# Patient Record
Sex: Male | Born: 1955 | Race: White | Hispanic: No | Smoking: Current every day smoker
Health system: Southern US, Community
[De-identification: ages and names within clinical notes are randomized; demographics above are authoritative.]

## PROBLEM LIST (undated history)

## (undated) DIAGNOSIS — M4800 Spinal stenosis, site unspecified: Secondary | ICD-10-CM

## (undated) DIAGNOSIS — R0600 Dyspnea, unspecified: Secondary | ICD-10-CM

## (undated) DIAGNOSIS — J432 Centrilobular emphysema: Secondary | ICD-10-CM

## (undated) DIAGNOSIS — M1990 Unspecified osteoarthritis, unspecified site: Secondary | ICD-10-CM

## (undated) DIAGNOSIS — F1010 Alcohol abuse, uncomplicated: Secondary | ICD-10-CM

## (undated) DIAGNOSIS — I2510 Atherosclerotic heart disease of native coronary artery without angina pectoris: Secondary | ICD-10-CM

## (undated) DIAGNOSIS — D692 Other nonthrombocytopenic purpura: Secondary | ICD-10-CM

## (undated) DIAGNOSIS — F1210 Cannabis abuse, uncomplicated: Secondary | ICD-10-CM

## (undated) DIAGNOSIS — Z9289 Personal history of other medical treatment: Secondary | ICD-10-CM

## (undated) DIAGNOSIS — N529 Male erectile dysfunction, unspecified: Secondary | ICD-10-CM

## (undated) DIAGNOSIS — Z720 Tobacco use: Secondary | ICD-10-CM

## (undated) DIAGNOSIS — E785 Hyperlipidemia, unspecified: Secondary | ICD-10-CM

## (undated) DIAGNOSIS — H9190 Unspecified hearing loss, unspecified ear: Secondary | ICD-10-CM

## (undated) DIAGNOSIS — K219 Gastro-esophageal reflux disease without esophagitis: Secondary | ICD-10-CM

## (undated) DIAGNOSIS — I214 Non-ST elevation (NSTEMI) myocardial infarction: Secondary | ICD-10-CM

## (undated) DIAGNOSIS — I10 Essential (primary) hypertension: Secondary | ICD-10-CM

## (undated) DIAGNOSIS — I219 Acute myocardial infarction, unspecified: Secondary | ICD-10-CM

## (undated) HISTORY — PX: OTHER SURGICAL HISTORY: SHX169

## (undated) HISTORY — DX: Unspecified hearing loss, unspecified ear: H91.90

## (undated) HISTORY — DX: Atherosclerotic heart disease of native coronary artery without angina pectoris: I25.10

## (undated) HISTORY — PX: COLONOSCOPY: SHX174

## (undated) HISTORY — PX: KNEE SURGERY: SHX244

## (undated) HISTORY — DX: Acute myocardial infarction, unspecified: I21.9

## (undated) HISTORY — PX: APPENDECTOMY: SHX54

## (undated) HISTORY — DX: Personal history of other medical treatment: Z92.89

---

## 2003-09-22 ENCOUNTER — Emergency Department (HOSPITAL_COMMUNITY): Admission: EM | Admit: 2003-09-22 | Discharge: 2003-09-22 | Payer: Self-pay | Admitting: Emergency Medicine

## 2005-09-10 ENCOUNTER — Emergency Department: Payer: Self-pay | Admitting: Emergency Medicine

## 2014-06-22 ENCOUNTER — Ambulatory Visit: Payer: Self-pay

## 2016-11-20 ENCOUNTER — Encounter: Payer: Self-pay | Admitting: *Deleted

## 2016-11-20 ENCOUNTER — Ambulatory Visit
Admission: EM | Admit: 2016-11-20 | Discharge: 2016-11-20 | Disposition: A | Discharge status: Home / Self Care | Payer: Medicaid Other | Attending: Family Medicine | Admitting: Family Medicine

## 2016-11-20 ENCOUNTER — Ambulatory Visit: Payer: Medicaid Other

## 2016-11-20 DIAGNOSIS — R030 Elevated blood-pressure reading, without diagnosis of hypertension: Secondary | ICD-10-CM

## 2016-11-20 DIAGNOSIS — J209 Acute bronchitis, unspecified: Secondary | ICD-10-CM | POA: Diagnosis not present

## 2016-11-20 DIAGNOSIS — R0789 Other chest pain: Secondary | ICD-10-CM | POA: Insufficient documentation

## 2016-11-20 DIAGNOSIS — R05 Cough: Secondary | ICD-10-CM

## 2016-11-20 DIAGNOSIS — F17200 Nicotine dependence, unspecified, uncomplicated: Secondary | ICD-10-CM | POA: Insufficient documentation

## 2016-11-20 DIAGNOSIS — R42 Dizziness and giddiness: Secondary | ICD-10-CM | POA: Diagnosis present

## 2016-11-20 DIAGNOSIS — R509 Fever, unspecified: Secondary | ICD-10-CM | POA: Insufficient documentation

## 2016-11-20 DIAGNOSIS — J9801 Acute bronchospasm: Secondary | ICD-10-CM

## 2016-11-20 DIAGNOSIS — J069 Acute upper respiratory infection, unspecified: Secondary | ICD-10-CM | POA: Insufficient documentation

## 2016-11-20 MED ORDER — ALBUTEROL SULFATE HFA 108 (90 BASE) MCG/ACT IN AERS: 2.0000 | INHALATION_SPRAY | RESPIRATORY_TRACT | 0 refills | Status: DC | PRN

## 2016-11-20 MED ORDER — HYDROCOD POLST-CPM POLST ER 10-8 MG/5ML PO SUER
5.0000 mL | Freq: Two times a day (BID) | ORAL | 0 refills | Status: DC | PRN
Start: 2016-11-20 — End: 2017-01-03

## 2016-11-20 MED ORDER — AZITHROMYCIN 500 MG PO TABS: ORAL_TABLET | ORAL | 0 refills | Status: DC

## 2016-11-20 MED ORDER — PREDNISONE 10 MG (21) PO TBPK: ORAL_TABLET | ORAL | 0 refills | Status: DC

## 2016-11-20 MED ORDER — CLONIDINE HCL 0.2 MG PO TABS
0.2000 mg | ORAL_TABLET | Freq: Once | ORAL | Status: AC
  Administered 2016-11-20: 0.2 mg via ORAL

## 2016-11-20 MED ORDER — IPRATROPIUM-ALBUTEROL 0.5-2.5 (3) MG/3ML IN SOLN
3.0000 mL | Freq: Once | RESPIRATORY_TRACT | Status: AC
  Administered 2016-11-20: 3 mL via RESPIRATORY_TRACT

## 2016-11-20 NOTE — ED Provider Notes (Signed)
[facility]    CSN: [id number] Arrival date & time: 11/20/16  1027     History   Chief Complaint Chief Complaint  Patient presents with  . Cough  . Dizziness  . Fever    HPI Bryan Ibarra. is a 61 y.o. male.   Patient is a 61 year old white male with history of coughing and congestion for the last 10 days. Started last week Saturday for cough and congestion. It has progressively gotten worse. He reports having bronchospasm shortness of breath and chest tightness. He also has had chills and fever with this as well. He is also set had bilateral back pain from this. States that is progressively getting worse for wheezing is getting worse so he finally came in to be seen and evaluated. His SO's also been sick with upper respiratory tract infection and Friday she has gets antibiotics with her doctor. He has not checked his temperature see how high his temperature then. Previous surgery he's had an appendectomy when he is not 17 no chronic medications of chronic medical problems. No known history hypertension with him. He states Dr. Maryellen Pile has been his regular doctor. Unfortunately but has not smoked lately since this illness. He does report having bronchospasms as well. No pertinent family medical history relevant to today's visit and he has no known drug allergies. He is also had dizziness as well. No history hypertension for the past.   The history is provided by the patient. No language interpreter was used.  Cough  Cough characteristics:  Productive Sputum characteristics:  Yellow and green Severity:  Moderate Onset quality:  Sudden Timing:  Constant Chronicity:  New Smoker: yes   Context: exposure to allergens, upper respiratory infection and with activity   Relieved by:  Nothing Worsened by:  Deep breathing and activity Associated symptoms: fever   Risk factors: recent infection   Risk factors: no recent travel   Dizziness  Fever  Associated symptoms: cough       History reviewed. No pertinent past medical history.  There are no active problems to display for this patient.   History reviewed. No pertinent surgical history.     Home Medications    Prior to Admission medications   Medication Sig Start Date End Date Taking? Authorizing Provider  albuterol (PROVENTIL HFA;VENTOLIN HFA) 108 (90 Base) MCG/ACT inhaler Inhale 2 puffs into the lungs every 4 (four) hours as needed for wheezing or shortness of breath. 11/20/16   Hassan Rowan, MD  azithromycin (ZITHROMAX) 500 MG tablet 1 tablet day for 5 days 11/20/16   Hassan Rowan, MD  chlorpheniramine-HYDROcodone [facility] ER) 10-8 MG/5ML SUER Take 5 mLs by mouth every 12 (twelve) hours as needed. 11/20/16   Hassan Rowan, MD  predniSONE (STERAPRED UNI-PAK 21 TAB) 10 MG (21) TBPK tablet Sig 6 tablet day 1, 5 tablets day 2, 4 tablets day 3,,3tablets day 4, 2 tablets day 5, 1 tablet day 6 take all tablets orally 11/20/16   Hassan Rowan, MD    Family History History reviewed. No pertinent family history.  Social History Social History  Substance Use Topics  . Smoking status: Current Every Day Smoker  . Smokeless tobacco: Never Used  . Alcohol use Yes     Allergies   Patient has no known allergies.   Review of Systems Review of Systems  Constitutional: Positive for fever.  Respiratory: Positive for cough.   Neurological: Positive for dizziness.     Physical Exam Triage Vital Signs ED Triage  Vitals  Enc Vitals Group     BP 11/20/16 1107 (!) 180/109     Pulse Rate 11/20/16 1107 76     Resp 11/20/16 1107 16     Temp 11/20/16 1107 99.9 F (37.7 C)     Temp Source 11/20/16 1107 Oral     SpO2 11/20/16 1107 98 %     Weight 11/20/16 1110 230 lb (104.3 kg)     Height 11/20/16 1110  (1.88 m)     Head Circumference --      Peak Flow --      Pain Score --      Pain Loc --      Pain Edu? --      Excl. in GC? --    No data found.   Updated Vital Signs BP (!) 180/109 (BP  Location: Left Arm)   Pulse 76   Temp 99.9 F (37.7 C) (Oral)   Resp 16   Ht  (1.88 m)   Wt 230 lb (104.3 kg)   SpO2 98%   BMI 29.53 kg/m   Visual Acuity Right Eye Distance:   Left Eye Distance:   Bilateral Distance:    Right Eye Near:   Left Eye Near:    Bilateral Near:     Physical Exam  Constitutional: He is oriented to person, place, and time. He appears well-developed and well-nourished. No distress.  HENT:  Head: Normocephalic and atraumatic.  Right Ear: External ear normal.  Left Ear: External ear normal.  Mouth/Throat: Oropharynx is clear and moist.  Eyes: EOM are normal. Pupils are equal, round, and reactive to light.  Neck: Normal range of motion. Neck supple.  Cardiovascular: Normal rate, regular rhythm and normal heart sounds.   Pulmonary/Chest: Effort normal. He has wheezes.  Patient actively coughing  Musculoskeletal: Normal range of motion.  Neurological: He is alert and oriented to person, place, and time.  Skin: Skin is warm.  Vitals reviewed.    UC Treatments / Results  Labs (all labs ordered are listed, but only abnormal results are displayed) Labs Reviewed - No data to display  EKG  EKG Interpretation None       Radiology Dg Chest 2 View  Result Date: 11/20/2016 CLINICAL DATA:  61 year old male with upper chest tightness, shortness of breath, productive cough and fever EXAM: CHEST  2 VIEW COMPARISON:  None. FINDINGS: The lungs are clear and negative for focal airspace consolidation, pulmonary edema or suspicious pulmonary nodule. No pleural effusion or pneumothorax. Cardiac and mediastinal contours are within normal limits. Aortic atherosclerosis in the transverse aorta. The descending aorta is tortuous. No acute fracture or lytic or blastic osseous lesions. The visualized upper abdominal bowel gas pattern is unremarkable. IMPRESSION: No active cardiopulmonary disease. Aortic Atherosclerosis (ICD10-170.0) Electronically Signed   By: Malachy Moan M.D.   On: 11/20/2016 12:29    Procedures Procedures (including critical care time)  Medications Ordered in UC Medications  cloNIDine (CATAPRES) tablet 0.2 mg (0.2 mg Oral Given 11/20/16 1217)  ipratropium-albuterol (DUONEB) 0.5-2.5 (3) MG/3ML nebulizer solution 3 mL (3 mLs Nebulization Given 11/20/16 1228)     Initial Impression / Assessment and Plan / UC Course  I have reviewed the triage vital signs and the nursing notes.  Pertinent labs & imaging results that were available during my care of the patient were reviewed by me and considered in my medical decision making (see chart for details).   patient is going to need follow-up for his  blood pressure with his PCP clonidine 0.2 mg was given by mouth we'll place him on Tussionex 1 teaspoon twice a day albuterol inhaler prednisone for 6 days ago placed on full dose of Zithromax 500 mg 1 tablet daily for 5 days. Note given to work may return back to work on Wednesday.  Chest x-ray was negative for pneumonia we'll place on Zithromax 500 mg 1 tablet daily for 5 days follow-up with PCP as  Final Clinical Impressions(s) / UC Diagnoses   Final diagnoses:  Upper respiratory tract infection, unspecified type  Acute bronchitis with bronchospasm  Blood pressure elevated without history of HTN    New Prescriptions New Prescriptions   ALBUTEROL (PROVENTIL HFA;VENTOLIN HFA) 108 (90 BASE) MCG/ACT INHALER    Inhale 2 puffs into the lungs every 4 (four) hours as needed for wheezing or shortness of breath.   AZITHROMYCIN (ZITHROMAX) 500 MG TABLET    1 tablet day for 5 days   CHLORPHENIRAMINE-HYDROCODONE (TUSSIONEX PENNKINETIC ER) 10-8 MG/5ML SUER    Take 5 mLs by mouth every 12 (twelve) hours as needed.   PREDNISONE (STERAPRED UNI-PAK 21 TAB) 10 MG (21) TBPK TABLET    Sig 6 tablet day 1, 5 tablets day 2, 4 tablets day 3,,3tablets day 4, 2 tablets day 5, 1 tablet day 6 take all tablets orally     Note: This dictation was prepared with  Dragon dictation along with smaller phrase technology. Any transcriptional errors that result from this process are unintentional.   Hassan Rowan, MD 11/20/16 1248

## 2016-11-20 NOTE — ED Triage Notes (Signed)
Productive cough- green, fever, dizziness, and chest soreness with coughing x1-2 weeks. OTC meds no longer helping.

## 2017-01-01 ENCOUNTER — Emergency Department: Payer: Medicaid Other

## 2017-01-01 ENCOUNTER — Encounter: Payer: Self-pay | Admitting: Emergency Medicine

## 2017-01-01 ENCOUNTER — Inpatient Hospital Stay: Admit: 2017-01-01 | Payer: Medicaid Other

## 2017-01-01 ENCOUNTER — Encounter
Admission: EM | Disposition: A | Discharge status: Home / Self Care | Payer: Self-pay | Source: Home / Self Care | Attending: Internal Medicine

## 2017-01-01 ENCOUNTER — Ambulatory Visit (INDEPENDENT_AMBULATORY_CARE_PROVIDER_SITE_OTHER)
Admission: EM | Admit: 2017-01-01 | Discharge: 2017-01-01 | Disposition: A | Discharge status: Other Acute Inpatient Hospital | Payer: Medicaid Other | Source: Home / Self Care | Attending: Family Medicine | Admitting: Family Medicine

## 2017-01-01 ENCOUNTER — Inpatient Hospital Stay
Admission: EM | Admit: 2017-01-01 | Discharge: 2017-01-03 | DRG: 247 | Disposition: A | Discharge status: Home / Self Care | Payer: Medicaid Other | Attending: Internal Medicine | Admitting: Internal Medicine

## 2017-01-01 DIAGNOSIS — I10 Essential (primary) hypertension: Secondary | ICD-10-CM | POA: Diagnosis present

## 2017-01-01 DIAGNOSIS — R079 Chest pain, unspecified: Secondary | ICD-10-CM

## 2017-01-01 DIAGNOSIS — I472 Ventricular tachycardia: Secondary | ICD-10-CM | POA: Diagnosis present

## 2017-01-01 DIAGNOSIS — E785 Hyperlipidemia, unspecified: Secondary | ICD-10-CM | POA: Diagnosis present

## 2017-01-01 DIAGNOSIS — I214 Non-ST elevation (NSTEMI) myocardial infarction: Principal | ICD-10-CM | POA: Diagnosis present

## 2017-01-01 DIAGNOSIS — Z7952 Long term (current) use of systemic steroids: Secondary | ICD-10-CM | POA: Diagnosis not present

## 2017-01-01 DIAGNOSIS — I252 Old myocardial infarction: Secondary | ICD-10-CM

## 2017-01-01 DIAGNOSIS — F1210 Cannabis abuse, uncomplicated: Secondary | ICD-10-CM | POA: Diagnosis present

## 2017-01-01 DIAGNOSIS — F17210 Nicotine dependence, cigarettes, uncomplicated: Secondary | ICD-10-CM | POA: Diagnosis present

## 2017-01-01 DIAGNOSIS — I2510 Atherosclerotic heart disease of native coronary artery without angina pectoris: Secondary | ICD-10-CM | POA: Diagnosis present

## 2017-01-01 DIAGNOSIS — F1010 Alcohol abuse, uncomplicated: Secondary | ICD-10-CM | POA: Diagnosis present

## 2017-01-01 DIAGNOSIS — Z79899 Other long term (current) drug therapy: Secondary | ICD-10-CM | POA: Diagnosis not present

## 2017-01-01 DIAGNOSIS — I25118 Atherosclerotic heart disease of native coronary artery with other forms of angina pectoris: Secondary | ICD-10-CM | POA: Diagnosis not present

## 2017-01-01 DIAGNOSIS — F1410 Cocaine abuse, uncomplicated: Secondary | ICD-10-CM | POA: Diagnosis present

## 2017-01-01 DIAGNOSIS — Z8249 Family history of ischemic heart disease and other diseases of the circulatory system: Secondary | ICD-10-CM | POA: Diagnosis not present

## 2017-01-01 HISTORY — DX: Cannabis abuse, uncomplicated: F12.10

## 2017-01-01 HISTORY — DX: Unspecified osteoarthritis, unspecified site: M19.90

## 2017-01-01 HISTORY — PX: LEFT HEART CATH AND CORONARY ANGIOGRAPHY: CATH118249

## 2017-01-01 HISTORY — DX: Alcohol abuse, uncomplicated: F10.10

## 2017-01-01 HISTORY — PX: CORONARY STENT INTERVENTION: CATH118234

## 2017-01-01 HISTORY — DX: Tobacco use: Z72.0

## 2017-01-01 HISTORY — DX: Essential (primary) hypertension: I10

## 2017-01-01 LAB — LIPASE, BLOOD: Lipase: 33 U/L (ref 11–51)

## 2017-01-01 LAB — BASIC METABOLIC PANEL
ANION GAP: 8 (ref 5–15)
BUN: 18 mg/dL (ref 6–20)
CALCIUM: 8.7 mg/dL — AB (ref 8.9–10.3)
CHLORIDE: 104 mmol/L (ref 101–111)
CO2: 25 mmol/L (ref 22–32)
Creatinine, Ser: 0.97 mg/dL (ref 0.61–1.24)
GFR calc non Af Amer: >60 mL/min (ref >60)
Glucose, Bld: 98 mg/dL (ref 65–99)
Potassium: 4.2 mmol/L (ref 3.5–5.1)
SODIUM: 137 mmol/L (ref 135–145)

## 2017-01-01 LAB — COMPREHENSIVE METABOLIC PANEL
ALK PHOS: 61 U/L (ref 38–126)
ALT: 24 U/L (ref 17–63)
AST: 23 U/L (ref 15–41)
Albumin: 4.2 g/dL (ref 3.5–5.0)
Anion gap: 8 (ref 5–15)
BILIRUBIN TOTAL: 1 mg/dL (ref 0.3–1.2)
BUN: 23 mg/dL — AB (ref 6–20)
CALCIUM: 9 mg/dL (ref 8.9–10.3)
CO2: 23 mmol/L (ref 22–32)
CREATININE: 0.91 mg/dL (ref 0.61–1.24)
Chloride: 106 mmol/L (ref 101–111)
GFR calc Af Amer: >60 mL/min (ref >60)
GFR calc non Af Amer: >60 mL/min (ref >60)
Glucose, Bld: 100 mg/dL — ABNORMAL HIGH (ref 65–99)
Potassium: 4 mmol/L (ref 3.5–5.1)
Sodium: 137 mmol/L (ref 135–145)
TOTAL PROTEIN: 7.1 g/dL (ref 6.5–8.1)

## 2017-01-01 LAB — CBC
HEMATOCRIT: 42.2 % (ref 40.0–52.0)
Hemoglobin: 14.7 g/dL (ref 13.0–18.0)
MCH: 31.9 pg (ref 26.0–34.0)
MCHC: 35 g/dL (ref 32.0–36.0)
MCV: 91.3 fL (ref 80.0–100.0)
Platelets: 200 10*3/uL (ref 150–440)
RBC: 4.62 MIL/uL (ref 4.40–5.90)
RDW: 13.9 % (ref 11.5–14.5)
WBC: 6.9 10*3/uL (ref 3.8–10.6)

## 2017-01-01 LAB — LIPID PANEL
Cholesterol: 143 mg/dL (ref 0–200)
HDL: 64 mg/dL (ref >40)
LDL CALC: 72 mg/dL (ref 0–99)
Total CHOL/HDL Ratio: 2.2 RATIO
Triglycerides: 37 mg/dL (ref <150)
VLDL: 7 mg/dL (ref 0–40)

## 2017-01-01 LAB — PROTIME-INR
INR: 0.99
Prothrombin Time: 13.1 seconds (ref 11.4–15.2)

## 2017-01-01 LAB — MAGNESIUM
MAGNESIUM: 2.1 mg/dL (ref 1.7–2.4)
Magnesium: 2.3 mg/dL (ref 1.7–2.4)

## 2017-01-01 LAB — TROPONIN I
TROPONIN I: 0.58 ng/mL — AB (ref <0.03)
TROPONIN I: 2.26 ng/mL — AB (ref <0.03)
TROPONIN I: 5.49 ng/mL — AB (ref <0.03)
Troponin I: 0.11 ng/mL (ref <0.03)

## 2017-01-01 LAB — HEPARIN LEVEL (UNFRACTIONATED): Heparin Unfractionated: 0.4 IU/mL (ref 0.30–0.70)

## 2017-01-01 LAB — MRSA PCR SCREENING: MRSA by PCR: NEGATIVE

## 2017-01-01 LAB — POCT ACTIVATED CLOTTING TIME
ACTIVATED CLOTTING TIME: 279 s
Activated Clotting Time: 252 seconds

## 2017-01-01 LAB — GLUCOSE, CAPILLARY: Glucose-Capillary: 87 mg/dL (ref 65–99)

## 2017-01-01 LAB — APTT: aPTT: 28 seconds (ref 24–36)

## 2017-01-01 SURGERY — LEFT HEART CATH AND CORONARY ANGIOGRAPHY
Anesthesia: Moderate Sedation

## 2017-01-01 MED ORDER — IOPAMIDOL (ISOVUE-370) INJECTION 76%
100.0000 mL | Freq: Once | INTRAVENOUS | Status: AC | PRN
  Administered 2017-01-01: 100 mL via INTRAVENOUS

## 2017-01-01 MED ORDER — HEPARIN (PORCINE) IN NACL 100-0.45 UNIT/ML-% IJ SOLN
1300.0000 [IU]/h | INTRAMUSCULAR | Status: DC
  Administered 2017-01-01: 1300 [IU]/h via INTRAVENOUS
  Filled 2017-01-01: qty 250

## 2017-01-01 MED ORDER — MORPHINE SULFATE (PF) 2 MG/ML IV SOLN
2.0000 mg | INTRAVENOUS | Status: DC | PRN
  Administered 2017-01-01 – 2017-01-02 (×4): 2 mg via INTRAVENOUS
  Filled 2017-01-01 (×4): qty 1

## 2017-01-01 MED ORDER — ALBUTEROL SULFATE HFA 108 (90 BASE) MCG/ACT IN AERS: 2.0000 | INHALATION_SPRAY | RESPIRATORY_TRACT | Status: DC | PRN

## 2017-01-01 MED ORDER — SODIUM CHLORIDE 0.9% FLUSH
3.0000 mL | Freq: Two times a day (BID) | INTRAVENOUS | Status: DC
  Administered 2017-01-02 – 2017-01-03 (×3): 3 mL via INTRAVENOUS

## 2017-01-01 MED ORDER — CARVEDILOL 6.25 MG PO TABS
6.2500 mg | ORAL_TABLET | Freq: Two times a day (BID) | ORAL | Status: DC
  Administered 2017-01-01 – 2017-01-03 (×4): 6.25 mg via ORAL
  Filled 2017-01-01 (×4): qty 1

## 2017-01-01 MED ORDER — DOCUSATE SODIUM 100 MG PO CAPS: 100.0000 mg | ORAL_CAPSULE | Freq: Two times a day (BID) | ORAL | Status: DC | PRN

## 2017-01-01 MED ORDER — NITROGLYCERIN 0.4 MG SL SUBL
0.4000 mg | SUBLINGUAL_TABLET | SUBLINGUAL | Status: DC | PRN
  Administered 2017-01-01 (×3): 0.4 mg via SUBLINGUAL
  Filled 2017-01-01: qty 1

## 2017-01-01 MED ORDER — IOPAMIDOL (ISOVUE-300) INJECTION 61%
INTRAVENOUS | Status: DC | PRN
  Administered 2017-01-01: 135 mL via INTRA_ARTERIAL

## 2017-01-01 MED ORDER — ALBUTEROL SULFATE (2.5 MG/3ML) 0.083% IN NEBU: 2.5000 mg | INHALATION_SOLUTION | RESPIRATORY_TRACT | Status: DC | PRN

## 2017-01-01 MED ORDER — HEPARIN (PORCINE) IN NACL 2-0.9 UNIT/ML-% IJ SOLN
INTRAMUSCULAR | Status: AC
  Filled 2017-01-01: qty 1000

## 2017-01-01 MED ORDER — TICAGRELOR 90 MG PO TABS
ORAL_TABLET | ORAL | Status: DC | PRN
  Administered 2017-01-01: 180 mg via ORAL

## 2017-01-01 MED ORDER — ASPIRIN 81 MG PO CHEW: 81.0000 mg | CHEWABLE_TABLET | ORAL | Status: DC

## 2017-01-01 MED ORDER — ASPIRIN EC 325 MG PO TBEC
325.0000 mg | DELAYED_RELEASE_TABLET | Freq: Every day | ORAL | Status: DC
  Administered 2017-01-01: 325 mg via ORAL
  Filled 2017-01-01: qty 1

## 2017-01-01 MED ORDER — HEPARIN BOLUS VIA INFUSION
4000.0000 [IU] | Freq: Once | INTRAVENOUS | Status: AC
  Administered 2017-01-01: 4000 [IU] via INTRAVENOUS
  Filled 2017-01-01: qty 4000

## 2017-01-01 MED ORDER — MORPHINE SULFATE (PF) 4 MG/ML IV SOLN
4.0000 mg | Freq: Once | INTRAVENOUS | Status: AC
  Administered 2017-01-01: 4 mg via INTRAVENOUS
  Filled 2017-01-01: qty 1

## 2017-01-01 MED ORDER — ASPIRIN 81 MG PO CHEW
81.0000 mg | CHEWABLE_TABLET | Freq: Every day | ORAL | Status: DC
  Administered 2017-01-01 – 2017-01-03 (×3): 81 mg via ORAL
  Filled 2017-01-01 (×3): qty 1

## 2017-01-01 MED ORDER — VERAPAMIL HCL 2.5 MG/ML IV SOLN
INTRAVENOUS | Status: AC
  Filled 2017-01-01: qty 2

## 2017-01-01 MED ORDER — NITROGLYCERIN 1 MG/10 ML FOR IR/CATH LAB
INTRA_ARTERIAL | Status: DC | PRN
  Administered 2017-01-01: 200 ug via INTRACORONARY

## 2017-01-01 MED ORDER — ENOXAPARIN SODIUM 40 MG/0.4ML ~~LOC~~ SOLN
40.0000 mg | SUBCUTANEOUS | Status: DC
  Administered 2017-01-02 – 2017-01-03 (×2): 40 mg via SUBCUTANEOUS
  Filled 2017-01-01 (×2): qty 0.4

## 2017-01-01 MED ORDER — SODIUM CHLORIDE 0.9% FLUSH: 3.0000 mL | INTRAVENOUS | Status: DC | PRN

## 2017-01-01 MED ORDER — SODIUM CHLORIDE 0.9 % WEIGHT BASED INFUSION
1.0000 mL/kg/h | INTRAVENOUS | Status: DC
  Administered 2017-01-01 – 2017-01-02 (×2): 1 mL/kg/h via INTRAVENOUS

## 2017-01-01 MED ORDER — TICAGRELOR 90 MG PO TABS
90.0000 mg | ORAL_TABLET | Freq: Two times a day (BID) | ORAL | Status: DC
  Administered 2017-01-02 – 2017-01-03 (×3): 90 mg via ORAL
  Filled 2017-01-01 (×4): qty 1

## 2017-01-01 MED ORDER — NITROGLYCERIN 5 MG/ML IV SOLN
INTRAVENOUS | Status: AC
  Filled 2017-01-01: qty 10

## 2017-01-01 MED ORDER — MIDAZOLAM HCL 2 MG/2ML IJ SOLN
INTRAMUSCULAR | Status: DC | PRN
  Administered 2017-01-01: 1 mg via INTRAVENOUS

## 2017-01-01 MED ORDER — SODIUM CHLORIDE 0.9 % IV SOLN
INTRAVENOUS | Status: DC
  Administered 2017-01-01: 18:00:00 via INTRAVENOUS

## 2017-01-01 MED ORDER — HEPARIN SODIUM (PORCINE) 1000 UNIT/ML IJ SOLN
INTRAMUSCULAR | Status: AC
  Filled 2017-01-01: qty 1

## 2017-01-01 MED ORDER — ONDANSETRON HCL 4 MG/2ML IJ SOLN
4.0000 mg | Freq: Once | INTRAMUSCULAR | Status: AC
  Administered 2017-01-01: 4 mg via INTRAVENOUS
  Filled 2017-01-01: qty 2

## 2017-01-01 MED ORDER — VERAPAMIL HCL 2.5 MG/ML IV SOLN
INTRAVENOUS | Status: DC | PRN
  Administered 2017-01-01: 2.5 mg via INTRA_ARTERIAL

## 2017-01-01 MED ORDER — SODIUM CHLORIDE 0.9 % IV SOLN: 250.0000 mL | INTRAVENOUS | Status: DC | PRN

## 2017-01-01 MED ORDER — TICAGRELOR 90 MG PO TABS
ORAL_TABLET | ORAL | Status: AC
  Filled 2017-01-01: qty 2

## 2017-01-01 MED ORDER — SODIUM CHLORIDE 0.9% FLUSH: 3.0000 mL | Freq: Two times a day (BID) | INTRAVENOUS | Status: DC

## 2017-01-01 MED ORDER — HYDRALAZINE HCL 20 MG/ML IJ SOLN
10.0000 mg | INTRAMUSCULAR | Status: DC | PRN
  Administered 2017-01-02: 10 mg via INTRAVENOUS
  Filled 2017-01-01: qty 1

## 2017-01-01 MED ORDER — HEPARIN SODIUM (PORCINE) 1000 UNIT/ML IJ SOLN
INTRAMUSCULAR | Status: DC | PRN
  Administered 2017-01-01: 1800 [IU] via INTRAVENOUS
  Administered 2017-01-01: 5000 [IU] via INTRAVENOUS
  Administered 2017-01-01: 4000 [IU] via INTRAVENOUS

## 2017-01-01 MED ORDER — MIDAZOLAM HCL 2 MG/2ML IJ SOLN
INTRAMUSCULAR | Status: AC
  Filled 2017-01-01: qty 2

## 2017-01-01 MED ORDER — NITROGLYCERIN 0.4 MG SL SUBL: 0.4000 mg | SUBLINGUAL_TABLET | SUBLINGUAL | Status: DC | PRN

## 2017-01-01 MED ORDER — ATORVASTATIN CALCIUM 20 MG PO TABS
80.0000 mg | ORAL_TABLET | Freq: Every day | ORAL | Status: DC
  Administered 2017-01-02: 80 mg via ORAL
  Filled 2017-01-01: qty 4

## 2017-01-01 MED ORDER — SODIUM CHLORIDE 0.9% FLUSH
3.0000 mL | INTRAVENOUS | Status: DC | PRN
  Administered 2017-01-02: 3 mL via INTRAVENOUS
  Filled 2017-01-01: qty 3

## 2017-01-01 SURGICAL SUPPLY — 15 items
BALLN TREK RX 2.5X12 (BALLOONS) ×2
BALLN ~~LOC~~ TREK RX 3.5X12 (BALLOONS) ×2
BALLOON TREK RX 2.5X12 (BALLOONS) ×1 IMPLANT
BALLOON ~~LOC~~ TREK RX 3.5X12 (BALLOONS) ×1 IMPLANT
CATH OPTITORQUE JACKY 4.0 5F (CATHETERS) ×2 IMPLANT
CATH VISTA GUIDE 6FR XBLAD3.5 (CATHETERS) ×2 IMPLANT
DEVICE INFLAT 30 PLUS (MISCELLANEOUS) ×2 IMPLANT
DEVICE RAD COMP TR BAND LRG (VASCULAR PRODUCTS) ×2 IMPLANT
GLIDESHEATH SLEND SS 6F .021 (SHEATH) ×2 IMPLANT
KIT MANI 3VAL PERCEP (MISCELLANEOUS) ×2 IMPLANT
PACK CARDIAC CATH (CUSTOM PROCEDURE TRAY) ×2 IMPLANT
STENT RESOLUTE ONYX 3.0X18 (Permanent Stent) ×2 IMPLANT
WIRE HITORQ VERSACORE ST 145CM (WIRE) ×2 IMPLANT
WIRE ROSEN-J .035X260CM (WIRE) ×2 IMPLANT
WIRE RUNTHROUGH .014X180CM (WIRE) ×2 IMPLANT

## 2017-01-01 NOTE — Care Management (Signed)
[facility].  Admitted with nstemi.  Heparin drip.  Medicaid Access Care.  No issues accessing medical care,obtaining medications.  His disability is due to arthritis.  Cardiology consult is pending

## 2017-01-01 NOTE — Progress Notes (Signed)
Patient admitted at about 1500 for chest pain radiating down his left arm.  Since admission his troponin's have elevated.  His chest pain remains a "6" of 10, and he's having short runs of vtach, which are becoming more frequent.   Dr. Kirke CorinArida will perform a cardiac cath within the next hour.

## 2017-01-01 NOTE — ED Triage Notes (Signed)
States began mid chest pain approx 9am, states increases with inspiration. States pain 10/10 on arrival, intermittent.

## 2017-01-01 NOTE — ED Notes (Signed)
Patient transported to CT 

## 2017-01-01 NOTE — Consult Note (Signed)
Cardiology Consult    Patient ID: Bryan Ibarra. MRN: [id number], DOB/AGE: 1955-08-05   Admit date: 01/01/2017 Date of Consult: 01/01/2017  Primary Physician: Derwood Kaplan, MD Primary Cardiologist: New Requesting Provider: Ozella Rocks, MD  Patient Profile    Bryan Ibarra. is a 61 y.o. male with a history of HTN, tob abuse, and remote cocaine abuse, who is being seen today for the evaluation of chest pain and NSTEMI at the request of Dr. Elisabeth Pigeon.  Past Medical History   Past Medical History:  Diagnosis Date  . Alcohol abuse    a. up to a 12 pack of beer/day.  . Arthritis   . Hypertension   . Marijuana abuse    a. occasional.  . Tobacco abuse     Past Surgical History:  Procedure Laterality Date  . APPENDECTOMY    . KNEE SURGERY       Allergies  No Known Allergies  History of Present Illness    61 y/o ? without a prior cardiac history.  He does have a h/o HTN, tob abuse, MJ use, ETOH abuse, remote cocaine abuse, and FH of premature CAD (father).  He is on disability related to knee pain s/p bilateral surgeries.  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  He was in his usoh until this afternoon, when he was push mowing his own yard and developed severe sscp radiating down both arms, associated with nausea, dyspnea, and paresthesias of the fingers.  He continued to mow his lawn for another 20 mins and then stopped due to persistent symptoms and called out to his girlfriend.  His GF brought him to urgent care in [facility] where EMS was called and he was taken to the [facility] ED.  Here, he continued to c/o chest pain. ECG w/ borderline ST elevation and freq pvc's.  Started on heparin and pain treated w/ ntg and morphine.  He is currently resting comfortably but says he continues to have c/p.  He has requested additional morphine.  Troponins have risen to 2.26.  Inpatient Medications    . aspirin EC  325 mg Oral Daily  . carvedilol  6.25 mg  Oral BID WC    Family History    Family History  Problem Relation Age of Onset  . CAD Father        s/p CABG in his 43's or 57's.  Marland Kitchen CAD Paternal Grandfather     Social History    Social History   Social History  . Marital status: Divorced    Spouse name: N/A  . Number of children: N/A  . Years of education: N/A   Occupational History  . Not on file.   Social History Main Topics  . Smoking status: Current Every Day Smoker    Packs/day: 1.50    Types: Cigarettes  . Smokeless tobacco: Never Used  . Alcohol use Yes     Comment: 12 pack of beer most days of the week.  . Drug use: Yes    Types: Marijuana     Comment: occasional MJ.  Remote cocaine.  . Sexual activity: Yes   Other Topics Concern  . Not on file   Social History Narrative   Lives in [place].  He lives in a mobile home in the backyard of his girlfriend.  He works in Aeronautical engineer.     Review of Systems    General:  No chills, fever, night sweats or  weight changes.  Cardiovascular:  +++ chest pain, +++ dyspnea on exertion, no edema, orthopnea, palpitations, paroxysmal nocturnal dyspnea. Dermatological: No rash, lesions/masses Respiratory: No cough, dyspnea Urologic: No hematuria, dysuria Abdominal:   +++ nausea, no vomiting, diarrhea, bright red blood per rectum, melena, or hematemesis Neurologic:  No visual changes, wkns, changes in mental status. All other systems reviewed and are otherwise negative except as noted above.  Physical Exam    Blood pressure (!) 153/92, pulse 64, temperature 97.8 F (36.6 C), temperature source Oral, resp. rate 18, height 6\' 2"  (1.88 m), weight 235 lb (106.6 kg), SpO2 100 %.  General: Pleasant, NAD Psych: Normal affect. Neuro: Alert and oriented X 3. Moves all extremities spontaneously. HEENT: Normal  Neck: Supple without bruits or JVD. Lungs:  Resp regular and unlabored, diminished breath sounds bilat. Heart: RRR, distant, no s3, s4, or murmurs. Abdomen: Soft,  non-tender, non-distended, BS + x 4.  Extremities: No clubbing, cyanosis or edema. DP/PT/Radials 2+ and equal bilaterally.  Labs     Recent Labs  01/01/17 1038 01/01/17 1310 01/01/17 1546  TROPONINI 0.11* 0.58* 2.26*   Lab Results  Component Value Date   WBC 6.9 01/01/2017   HGB 14.7 01/01/2017   HCT 42.2 01/01/2017   MCV 91.3 01/01/2017   PLT 200 01/01/2017     Recent Labs Lab 01/01/17 1038  NA 137  K 4.0  CL 106  CO2 23  BUN 23*  CREATININE 0.91  CALCIUM 9.0  PROT 7.1  BILITOT 1.0  ALKPHOS 61  ALT 24  AST 23  GLUCOSE 100*   Lab Results  Component Value Date   CHOL 143 01/01/2017   HDL 64 01/01/2017   LDLCALC 72 01/01/2017   TRIG 37 01/01/2017    Radiology Studies    Dg Chest 2 View  Result Date: 01/01/2017 CLINICAL DATA:  Chest pain EXAM: CHEST  2 VIEW COMPARISON:  November 20, 2016 FINDINGS: There is no edema or consolidation. The heart size and pulmonary vascularity are normal. No adenopathy. There is aortic atherosclerosis. There is left carotid artery calcification. IMPRESSION: Aortic atherosclerosis. Left carotid artery calcification. No edema or consolidation. Electronically Signed   By: Bretta Bang III M.D.   On: 01/01/2017 11:05   Ct Angio Chest/abd/pel For Dissection W And/or Wo Contrast  Result Date: 01/01/2017 CLINICAL DATA:  Chest and abdominal pain EXAM: CT ANGIOGRAPHY CHEST, ABDOMEN AND PELVIS TECHNIQUE: Initially, axial CT images were obtained through the chest without intravenous contrast material administration. Multidetector CT imaging through the chest, abdomen and pelvis was performed using the standard protocol during bolus administration of intravenous contrast. Multiplanar reconstructed images and MIPs were obtained and reviewed to evaluate the vascular anatomy. CONTRAST:  100 mL Isovue 370 nonionic COMPARISON:  Chest radiograph January 01, 2017 FINDINGS: CTA CHEST FINDINGS Cardiovascular: There is no evident intramural hematoma on  noncontrast enhanced study. There is no appreciable thoracic aortic aneurysm or dissection. The visualized great vessels appear normal except for slight atherosclerotic calcification in the right common carotid artery. There is calcification in the aortic arch region. There are foci of coronary artery calcification. Pericardium is not appreciably thickened. No pulmonary embolus is demonstrated. Mediastinum/Nodes: Visualized thyroid appears unremarkable. There is no appreciable thoracic adenopathy. There is a small hiatal hernia. Lungs/Pleura: On axial slice 52 series 7, there is a 5 mm nodular opacity in the medial segment of the right lower lobe. There is no lung edema or consolidation. No pleural effusion or pleural thickening. No pneumothorax. Musculoskeletal: There  old healed rib fractures on the left. There are no blastic or lytic bone lesions. Review of the MIP images confirms the above findings. CTA ABDOMEN AND PELVIS FINDINGS VASCULAR Aorta: There is no abdominal aortic aneurysm or dissection. There is atherosclerotic calcification and slight peripheral thrombus in the distal abdominal aorta. Celiac: There is no celiac artery aneurysm or dissection. This vessel and its major branches are patent. SMA: There is no superior mesenteric artery aneurysm or dissection. The specimen its major branches appear patent. Renals: There is a single renal artery on each side. The right main renal artery bifurcates slightly beyond its origin. There is no aneurysm or dissection involving the renal artery and its branches. No fibromuscular dysplasia evident. Renal arteries in their peripheral branches appear patent. IMA: Inferior mesenteric artery and its major branches appear patent. Inferior mesenteric artery and its branches show no aneurysm or dissection. Inflow: There is atherosclerotic calcification at the origin of the left common iliac artery with 40-50% diameter stenosis at the origin of the left common iliac artery.  There is minimal atherosclerotic calcification in the right common iliac artery. There is mild calcification at the origin of the left internal iliac artery. Scattered foci of calcification are noted in each internal iliac artery. The other major pelvic vessels appear patent. No aneurysm or dissection evident in the major pelvic arterial vessels. Note that there is mild calcification in the periphery of the right common femoral artery with perhaps 15-20% diameter stenosis. Veins: No obvious venous abnormality within the limitations of this arterial phase study. Review of the MIP images confirms the above findings. NON-VASCULAR Hepatobiliary: No focal liver lesions are appreciable. Gallbladder wall is not appreciably thickened. There is no biliary duct dilatation. Pancreas: No pancreatic mass or inflammatory focus. Spleen: No splenic lesions are evident. Adrenals/Urinary Tract: Right adrenal appears normal. There is mild generalized left adrenal hypertrophy. Kidneys bilaterally show no evident mass or hydronephrosis on either side. There is no renal or ureteral calculus on either side. Urinary bladder is midline with wall thickness within normal limits. Stomach/Bowel: There is no bowel wall or mesenteric thickening. There are scattered sigmoid and distal descending colonic diverticulum without diverticulitis. There is no bowel obstruction. No free air or portal venous air. Lymphatic: There is no evident adenopathy in the abdomen or pelvis. Reproductive: Prostate and seminal vesicles appear normal in size and contour. No evident pelvic mass. Other: Appendix absent. No abscess or ascites evident in the abdomen or pelvis. There is a minimal ventral hernia containing only fat. Musculoskeletal: There is mild degenerative change in the lumbar spine. There is moderate spinal stenosis at L4-5 due to diffuse disc protrusion and bony hypertrophy. There are no blastic or lytic bone lesions. No intramuscular or abdominal wall  lesion. Review of the MIP images confirms the above findings. IMPRESSION: CT angiogram chest: No thoracic aortic aneurysm or dissection. No pulmonary embolus. Scattered foci of atherosclerotic calcification in the aorta as well as foci of coronary artery calcification. No lung edema or consolidation. 5 mm nodular opacity right lower lobe medial segment. No follow-up needed if patient is low-risk. Non-contrast chest CT can be considered in 12 months if patient is high-risk. This recommendation follows the consensus statement: Guidelines for Management of Incidental Pulmonary Nodules Detected on CT Images: From the Fleischner Society 2017; Radiology 2017; 284:228-243. No adenopathy. Subacute to chronic appearing rib fractures on the left. CT angiogram abdomen and CT angiogram pelvis: No aneurysm or dissection in the aorta or major mesenteric/ pelvic arterial vessels.  Scattered areas of atherosclerosis, most notably in the distal aorta and origin of the left common iliac artery. No high-grade obstruction. Spinal stenosis at L4-5, multifactorial. Left adrenal hypertrophy.  Significance of this finding uncertain. Scattered left-sided colonic diverticular without diverticulitis. No bowel obstruction. No abscess. Appendix absent. Minimal ventral hernia containing only fat. Electronically Signed   By: Bretta Bang III M.D.   On: 01/01/2017 12:12    ECG & Cardiac Imaging    RSR, 65, PVC, LAD, LAFB, ant infarct.  Assessment & Plan    1.  NSTEMI:  Pt presented 6/4 with persistent c/p after mowing his lawn.  He has never had c/p like this before.  Troponins have risen to 2.26.  He cont to report chest pain - appears comfortable.  Freq PVC's and short runs of NSVT on tele.  Will plan on diagnostic cath this evening.  The patient understands that risks include but are not limited to stroke (1 in 1000), death (1 in 1000), kidney failure [usually temporary] (1 in 500), bleeding (1 in 200), allergic reaction [possibly  serious] (1 in 200), and agrees to proceed.  Cont asa,  blocker.  Add high potency statin.  Eventual cardiac rehab.  Check echo.  2.  HTN:  Not on meds @ home.  Agree w/  blocker.  If renal fxn stable post cath, will consider ARB.  3.  Pulm Nodule:  5mm RLL nodular opacity noted on CTA  rec 12 month f/u.  4.  Tob Abuse:  Cessation advised.  Will add nicotine patch.  5.  ETOH abuse:  Up to a 12 pack of beer most days of the week.  Will likely need CIWA protocol.  6.  Lipids:  LDL 72.  High potency statin in the setting of #1.  Signed, Nicolasa Ducking, NP 01/01/2017, 5:37 PM

## 2017-01-01 NOTE — Progress Notes (Signed)
[facility] for this [facility]. [facility], patient aware.

## 2017-01-01 NOTE — ED Triage Notes (Signed)
Mid sternal chest pain while mowing the grass 45 min ago. Feels like an elephant is sitting on his chest. Arms tingling, clammy. Pain 10/10

## 2017-01-01 NOTE — H&P (Signed)
[employer] - [place] at [facility]   PATIENT NAME: Bryan Ibarra    MR#:  [id number]  DATE OF BIRTH:  23-Aug-1955  DATE OF ADMISSION:  01/01/2017  PRIMARY CARE PHYSICIAN: Derwood Kaplan, MD   REQUESTING/REFERRING PHYSICIAN: [email or web address]  CHIEF COMPLAINT:   Chief Complaint  Patient presents with  . Chest Pain    HISTORY OF PRESENT ILLNESS: Bryan Ibarra  is a 61 y.o. male with a known history of Arthritis and hypertension- does not follow with any physician as outpatient since long time. She does labor work and Musician. He has lot of physical activities every day and denies any complaints with that. Today while he was mowing the lawn he had pain in his chest and going to his left arm, he also continue to feel short of breath with that and sweating. Concerned with this he came to emergency room. He denies any cough or sputum production or palpitation. He felt slightly better after receiving nitroglycerin tablets sublingually in ER. His troponin was noted 0.11 so concerning his typical symptoms started on heparin IV drip and given to hospitalist team for further management of non-ST elevation MI.  PAST MEDICAL HISTORY:   Past Medical History:  Diagnosis Date  . Arthritis   . Hypertension     PAST SURGICAL HISTORY: Past Surgical History:  Procedure Laterality Date  . APPENDECTOMY    . KNEE SURGERY      SOCIAL HISTORY:  Social History  Substance Use Topics  . Smoking status: Current Every Day Smoker    Packs/day: 1.50    Types: Cigarettes  . Smokeless tobacco: Never Used  . Alcohol use Yes     Comment: several times weekly    FAMILY HISTORY:  Family History  Problem Relation Age of Onset  . CAD Father   . CAD Paternal Grandfather     DRUG ALLERGIES: No Known Allergies  REVIEW OF SYSTEMS:   CONSTITUTIONAL: No fever, fatigue or weakness.  EYES: No blurred or double vision.  EARS, NOSE, AND THROAT: No tinnitus or ear pain.  RESPIRATORY: No  cough, Positive for shortness of breath, wheezing or hemoptysis.  CARDIOVASCULAR: Positive for chest pain, no orthopnea, edema.  GASTROINTESTINAL: No nausea, vomiting, diarrhea or abdominal pain.  GENITOURINARY: No dysuria, hematuria.  ENDOCRINE: No polyuria, nocturia,  HEMATOLOGY: No anemia, easy bruising or bleeding SKIN: No rash or lesion. MUSCULOSKELETAL: No joint pain or arthritis.   NEUROLOGIC: No tingling, numbness, weakness.  PSYCHIATRY: No anxiety or depression.   MEDICATIONS AT HOME:  Prior to Admission medications   Medication Sig Start Date End Date Taking? Authorizing Provider  albuterol (PROVENTIL HFA;VENTOLIN HFA) 108 (90 Base) MCG/ACT inhaler Inhale 2 puffs into the lungs every 4 (four) hours as needed for wheezing or shortness of breath. 11/20/16   Hassan Rowan, MD  azithromycin (ZITHROMAX) 500 MG tablet 1 tablet day for 5 days 11/20/16   Hassan Rowan, MD  chlorpheniramine-HYDROcodone [facility] ER) 10-8 MG/5ML SUER Take 5 mLs by mouth every 12 (twelve) hours as needed. 11/20/16   Hassan Rowan, MD  predniSONE (STERAPRED UNI-PAK 21 TAB) 10 MG (21) TBPK tablet Sig 6 tablet day 1, 5 tablets day 2, 4 tablets day 3,,3tablets day 4, 2 tablets day 5, 1 tablet day 6 take all tablets orally 11/20/16   Hassan Rowan, MD      PHYSICAL EXAMINATION:   VITAL SIGNS: Blood pressure (!) 143/88, pulse 75, temperature 97.8 F (36.6 C), temperature source Oral, resp. rate 20, height  6\' 2"  (1.88 m), weight 106.6 kg (235 lb), SpO2 (!) 84 %.  GENERAL:  61 y.o.-year-old patient lying in the bed with no acute distress.  EYES: Pupils equal, round, reactive to light and accommodation. No scleral icterus. Extraocular muscles intact.  HEENT: Head atraumatic, normocephalic. Oropharynx and nasopharynx clear.  NECK:  Supple, no jugular venous distention. No thyroid enlargement, no tenderness.  LUNGS: Normal breath sounds bilaterally, no wheezing, rales,rhonchi or crepitation. No use of  accessory muscles of respiration.  CARDIOVASCULAR: S1, S2 normal. No murmurs, rubs, or gallops.  ABDOMEN: Soft, nontender, nondistended. Bowel sounds present. No organomegaly or mass.  EXTREMITIES: No pedal edema, cyanosis, or clubbing.  NEUROLOGIC: Cranial nerves II through XII are intact. Muscle strength 5/5 in all extremities. Sensation intact. Gait not checked.  PSYCHIATRIC: The patient is alert and oriented x 3.  SKIN: No obvious rash, lesion, or ulcer.   LABORATORY PANEL:   CBC  Recent Labs Lab 01/01/17 1038  WBC 6.9  HGB 14.7  HCT 42.2  PLT 200  MCV 91.3  MCH 31.9  MCHC 35.0  RDW 13.9   ------------------------------------------------------------------------------------------------------------------  Chemistries   Recent Labs Lab 01/01/17 1038  NA 137  K 4.0  CL 106  CO2 23  GLUCOSE 100*  BUN 23*  CREATININE 0.91  CALCIUM 9.0  AST 23  ALT 24  ALKPHOS 61  BILITOT 1.0   ------------------------------------------------------------------------------------------------------------------ estimated creatinine clearance is 112.3 mL/min (by C-G formula based on SCr of 0.91 mg/dL). ------------------------------------------------------------------------------------------------------------------ No results for input(s): TSH, T4TOTAL, T3FREE, THYROIDAB in the last 72 hours.  Invalid input(s): FREET3   Coagulation profile  Recent Labs Lab 01/01/17 1131  INR 0.99   ------------------------------------------------------------------------------------------------------------------- No results for input(s): DDIMER in the last 72 hours. -------------------------------------------------------------------------------------------------------------------  Cardiac Enzymes  Recent Labs Lab 01/01/17 1038  TROPONINI 0.11*   ------------------------------------------------------------------------------------------------------------------ Invalid input(s):  POCBNP  ---------------------------------------------------------------------------------------------------------------  Urinalysis No results found for: COLORURINE, APPEARANCEUR, LABSPEC, PHURINE, GLUCOSEU, HGBUR, BILIRUBINUR, KETONESUR, PROTEINUR, UROBILINOGEN, NITRITE, LEUKOCYTESUR   RADIOLOGY: Dg Chest 2 View  Result Date: 01/01/2017 CLINICAL DATA:  Chest pain EXAM: CHEST  2 VIEW COMPARISON:  November 20, 2016 FINDINGS: There is no edema or consolidation. The heart size and pulmonary vascularity are normal. No adenopathy. There is aortic atherosclerosis. There is left carotid artery calcification. IMPRESSION: Aortic atherosclerosis. Left carotid artery calcification. No edema or consolidation. Electronically Signed   By: Bretta BangWilliam  Woodruff III M.D.   On: 01/01/2017 11:05   Ct Angio Chest/abd/pel For Dissection W And/or Wo Contrast  Result Date: 01/01/2017 CLINICAL DATA:  Chest and abdominal pain EXAM: CT ANGIOGRAPHY CHEST, ABDOMEN AND PELVIS TECHNIQUE: Initially, axial CT images were obtained through the chest without intravenous contrast material administration. Multidetector CT imaging through the chest, abdomen and pelvis was performed using the standard protocol during bolus administration of intravenous contrast. Multiplanar reconstructed images and MIPs were obtained and reviewed to evaluate the vascular anatomy. CONTRAST:  100 mL Isovue 370 nonionic COMPARISON:  Chest radiograph January 01, 2017 FINDINGS: CTA CHEST FINDINGS Cardiovascular: There is no evident intramural hematoma on noncontrast enhanced study. There is no appreciable thoracic aortic aneurysm or dissection. The visualized great vessels appear normal except for slight atherosclerotic calcification in the right common carotid artery. There is calcification in the aortic arch region. There are foci of coronary artery calcification. Pericardium is not appreciably thickened. No pulmonary embolus is demonstrated. Mediastinum/Nodes:  Visualized thyroid appears unremarkable. There is no appreciable thoracic adenopathy. There is a small hiatal hernia. Lungs/Pleura: On axial slice 52  series 7, there is a 5 mm nodular opacity in the medial segment of the right lower lobe. There is no lung edema or consolidation. No pleural effusion or pleural thickening. No pneumothorax. Musculoskeletal: There old healed rib fractures on the left. There are no blastic or lytic bone lesions. Review of the MIP images confirms the above findings. CTA ABDOMEN AND PELVIS FINDINGS VASCULAR Aorta: There is no abdominal aortic aneurysm or dissection. There is atherosclerotic calcification and slight peripheral thrombus in the distal abdominal aorta. Celiac: There is no celiac artery aneurysm or dissection. This vessel and its major branches are patent. SMA: There is no superior mesenteric artery aneurysm or dissection. The specimen its major branches appear patent. Renals: There is a single renal artery on each side. The right main renal artery bifurcates slightly beyond its origin. There is no aneurysm or dissection involving the renal artery and its branches. No fibromuscular dysplasia evident. Renal arteries in their peripheral branches appear patent. IMA: Inferior mesenteric artery and its major branches appear patent. Inferior mesenteric artery and its branches show no aneurysm or dissection. Inflow: There is atherosclerotic calcification at the origin of the left common iliac artery with 40-50% diameter stenosis at the origin of the left common iliac artery. There is minimal atherosclerotic calcification in the right common iliac artery. There is mild calcification at the origin of the left internal iliac artery. Scattered foci of calcification are noted in each internal iliac artery. The other major pelvic vessels appear patent. No aneurysm or dissection evident in the major pelvic arterial vessels. Note that there is mild calcification in the periphery of the right  common femoral artery with perhaps 15-20% diameter stenosis. Veins: No obvious venous abnormality within the limitations of this arterial phase study. Review of the MIP images confirms the above findings. NON-VASCULAR Hepatobiliary: No focal liver lesions are appreciable. Gallbladder wall is not appreciably thickened. There is no biliary duct dilatation. Pancreas: No pancreatic mass or inflammatory focus. Spleen: No splenic lesions are evident. Adrenals/Urinary Tract: Right adrenal appears normal. There is mild generalized left adrenal hypertrophy. Kidneys bilaterally show no evident mass or hydronephrosis on either side. There is no renal or ureteral calculus on either side. Urinary bladder is midline with wall thickness within normal limits. Stomach/Bowel: There is no bowel wall or mesenteric thickening. There are scattered sigmoid and distal descending colonic diverticulum without diverticulitis. There is no bowel obstruction. No free air or portal venous air. Lymphatic: There is no evident adenopathy in the abdomen or pelvis. Reproductive: Prostate and seminal vesicles appear normal in size and contour. No evident pelvic mass. Other: Appendix absent. No abscess or ascites evident in the abdomen or pelvis. There is a minimal ventral hernia containing only fat. Musculoskeletal: There is mild degenerative change in the lumbar spine. There is moderate spinal stenosis at L4-5 due to diffuse disc protrusion and bony hypertrophy. There are no blastic or lytic bone lesions. No intramuscular or abdominal wall lesion. Review of the MIP images confirms the above findings. IMPRESSION: CT angiogram chest: No thoracic aortic aneurysm or dissection. No pulmonary embolus. Scattered foci of atherosclerotic calcification in the aorta as well as foci of coronary artery calcification. No lung edema or consolidation. 5 mm nodular opacity right lower lobe medial segment. No follow-up needed if patient is low-risk. Non-contrast chest  CT can be considered in 12 months if patient is high-risk. This recommendation follows the consensus statement: Guidelines for Management of Incidental Pulmonary Nodules Detected on CT Images: From the Fleischner Society  2017; Radiology 2017; 161:096-045. No adenopathy. Subacute to chronic appearing rib fractures on the left. CT angiogram abdomen and CT angiogram pelvis: No aneurysm or dissection in the aorta or major mesenteric/ pelvic arterial vessels. Scattered areas of atherosclerosis, most notably in the distal aorta and origin of the left common iliac artery. No high-grade obstruction. Spinal stenosis at L4-5, multifactorial. Left adrenal hypertrophy.  Significance of this finding uncertain. Scattered left-sided colonic diverticular without diverticulitis. No bowel obstruction. No abscess. Appendix absent. Minimal ventral hernia containing only fat. Electronically Signed   By: Bretta Bang III M.D.   On: 01/01/2017 12:12    EKG: Orders placed or performed during the hospital encounter of 01/01/17  . EKG 12-Lead  . EKG 12-Lead  Normal sinus rhythm, no ST-T changes.  IMPRESSION AND PLAN:  * Non-ST elevation MI   IV heparin drip started by ER physician spoke to cardiologist.   I will admit on telemetry and follow serial troponin.   Check lipid panel and hemoglobin A1c.   Also do echocardiogram.   Cardiology consult for further management.  * Hypertension   Carvedilol started, need to adjust her for better control.  * Chronic smoking   Counseled to quit smoking for 4 minutes and offered nicotine patch.  All the records are reviewed and case discussed with ED provider. Management plans discussed with the patient, family and they are in agreement.  CODE STATUS: Full code Code Status History    This patient does not have a recorded code status. Please follow your organizational policy for patients in this situation.       TOTAL TIME TAKING CARE OF THIS PATIENT: 50 minutes.     Altamese Dilling M.D on 01/01/2017   Between 7am to 6pm - Pager - [phone]  After 6pm go to [email or web address] - password [email or web address]  [facility]  Office  [phone]  CC: Primary care physician; Derwood Kaplan, MD   Note: This dictation was prepared with Dragon dictation along with smaller phrase technology. Any transcriptional errors that result from this process are unintentional.

## 2017-01-01 NOTE — Progress Notes (Signed)
ANTICOAGULATION CONSULT NOTE - Initial Consult  Pharmacy Consult for Heparin Indication: chest pain/ACS  No Known Allergies  Patient Measurements: Height: 6\' 2"  (188 cm) Weight: 235 lb (106.6 kg) IBW/kg (Calculated) : 82.2 Heparin Dosing Weight: 103.9 kg  Vital Signs: Temp: 97.8 F (36.6 C) (06/04 1036) Temp Source: Oral (06/04 1036) BP: 164/109 (06/04 1112) Pulse Rate: 67 (06/04 1112)  Labs:  Recent Labs  01/01/17 1038  HGB 14.7  HCT 42.2  PLT 200  CREATININE 0.91  TROPONINI 0.11*    Estimated Creatinine Clearance: 112.3 mL/min (by C-G formula based on SCr of 0.91 mg/dL).   Medical History: Past Medical History:  Diagnosis Date  . Arthritis   . Hypertension      Assessment: 61 yo male here with chest pain starting on heparin drip. No oral anticoagulants on PTA med list.   Goal of Therapy:  Heparin level 0.3-0.7 units/ml Monitor platelets by anticoagulation protocol: Yes   Plan:  Stat aPTT and INR ordered Heparin bolus 4000 units IV x1 then 1300 units/hr (=13 ml/hr) Heparin level in 6h after start of heparin drip CBC in AM  Pharmacy will continue to follow.   Crist FatWang,  L 01/01/2017,11:31 AM

## 2017-01-01 NOTE — ED Provider Notes (Signed)
[facility] Emergency Department Provider Note  Time seen: 10:39 AM  I have reviewed the triage vital signs and the nursing notes.   HISTORY  Chief Complaint Chest Pain    HPI Bryan Ibarra. is a 61 y.o. male with a past medical history of arthritis, hypertension presents the emergency department for central chest/epigastric pain. According to the patient he was mowing his lawn approximately 1 hour ago when he developed sudden onset of central chest pain/epigastric pain which she states feels like pressure like somebody sitting on the area. States he became nauseated somewhat short of breath and diaphoretic. Patient went to med in urgent care and was brought to the ER by EMS for further evaluation. Patient continues to state 10/10 central chest pain/epigastric pain. When he points to the area of pain he points to his epigastrium. Patient does state he drinks alcohol 3 or 4 days a week. Denies any history of cardiac disease or pancreatitis. States continued mild nausea but the shortness of breath and diaphoresis have resolved.  Past Medical History:  Diagnosis Date  . Arthritis   . Hypertension     There are no active problems to display for this patient.   Past Surgical History:  Procedure Laterality Date  . APPENDECTOMY    . KNEE SURGERY      Prior to Admission medications   Medication Sig Start Date End Date Taking? Authorizing Provider  albuterol (PROVENTIL HFA;VENTOLIN HFA) 108 (90 Base) MCG/ACT inhaler Inhale 2 puffs into the lungs every 4 (four) hours as needed for wheezing or shortness of breath. 11/20/16   Hassan Rowan, MD  azithromycin (ZITHROMAX) 500 MG tablet 1 tablet day for 5 days 11/20/16   Hassan Rowan, MD  chlorpheniramine-HYDROcodone [facility] ER) 10-8 MG/5ML SUER Take 5 mLs by mouth every 12 (twelve) hours as needed. 11/20/16   Hassan Rowan, MD  predniSONE (STERAPRED UNI-PAK 21 TAB) 10 MG (21) TBPK tablet Sig 6 tablet day 1,  5 tablets day 2, 4 tablets day 3,,3tablets day 4, 2 tablets day 5, 1 tablet day 6 take all tablets orally 11/20/16   Hassan Rowan, MD    No Known Allergies  No family history on file.  Social History Social History  Substance Use Topics  . Smoking status: Current Every Day Smoker    Packs/day: 1.50    Types: Cigarettes  . Smokeless tobacco: Never Used  . Alcohol use Yes     Comment: several times weekly    Review of Systems Constitutional: Negative for fever. Cardiovascular:Positive for epigastric pain/central chest pain Respiratory: Shortness of breath which has resolved Gastrointestinal: Negative for epigastric pain. Positive for nausea, now gone. Negative for vomiting or diarrhea Genitourinary: Negative for dysuria. Musculoskeletal: Negative for back pain. Skin: Negative for rash. Neurological: Negative for headache All other ROS negative  ____________________________________________   PHYSICAL EXAM:  VITAL SIGNS: ED Triage Vitals  Enc Vitals Group     BP 01/01/17 1036 (!) 177/100     Pulse Rate 01/01/17 1036 63     Resp 01/01/17 1036 18     Temp 01/01/17 1036 97.8 F (36.6 C)     Temp Source 01/01/17 1036 Oral     SpO2 01/01/17 1036 100 %     Weight 01/01/17 1038 235 lb (106.6 kg)     Height 01/01/17 1038 6\' 2"  (1.88 m)     Head Circumference --      Peak Flow --      Pain Score  01/01/17 1036 10     Pain Loc --      Pain Edu? --      Excl. in GC? --     Constitutional: Alert and oriented. Well appearing and in no distress. Eyes: Normal exam ENT   Head: Normocephalic and atraumatic.   Mouth/Throat: Mucous membranes are moist. Cardiovascular: Normal rate, regular rhythm. No murmur Respiratory: Normal respiratory effort without tachypnea nor retractions. Breath sounds are clear  Gastrointestinal: Soft, moderate epigastric tenderness palpation. No rebound or guarding. No distention. Musculoskeletal: Nontender with normal range of motion in all  extremities. No lower extremity tenderness or edema. Neurologic:  Normal speech and language. No gross focal neurologic deficits  Skin:  Skin is warm, dry and intact.  Psychiatric: Mood and affect are normal.   ____________________________________________    EKG  EKG reviewed and interpreted by myself shows normal sinus rhythm at 65 bpm, narrow QRS, normal axis, normal intervals, no ST changes, occasional PVC.  ____________________________________________    RADIOLOGY  Chest x-ray largely negative besides peripheral artery disease.  ____________________________________________   INITIAL IMPRESSION / ASSESSMENT AND PLAN / ED COURSE  Pertinent labs & imaging results that were available during my care of the patient were reviewed by me and considered in my medical decision making (see chart for details).  Patient presents to the emergency department for chest/epigastric pain which started acutely approximately one hour ago. Patient's EKG overall is reassuring, no ST elevation noted. We will treat pain. Patient has received aspirin prior to ER. We will check labs including troponin and lipase, chest x-ray and continue to closely monitor.  Patient continues with significant chest pain despite morphine. We will try nitroglycerin for symptom resolution. Given the patient's hypertension with acute onset of chest pain which continues to be severe we'll obtain a CT angiography of the chest rule out dissection. Patient's troponin is elevated 0.11 likely due to NSTEMI. We will start the patient on a heparin drip once a dissection has been ruled out.  CRITICAL CARE Performed by: Minna AntisPADUCHOWSKI,    Total critical care time: 30 minutes  Critical care time was exclusive of separately billable procedures and treating other patients.  Critical care was necessary to treat or prevent imminent or life-threatening deterioration.  Critical care was time spent personally by me on the following  activities: development of treatment plan with patient and/or surrogate as well as nursing, discussions with consultants, evaluation of patient's response to treatment, examination of patient, obtaining history from patient or surrogate, ordering and performing treatments and interventions, ordering and review of laboratory studies, ordering and review of radiographic studies, pulse oximetry and re-evaluation of patient's condition.   CT scan negative for dissection. Patient will be admitted to the hospital for further treatment.     ____________________________________________   FINAL CLINICAL IMPRESSION(S) / ED DIAGNOSES  Chest pain NSTEMI   Minna AntisPaduchowski, , MD 01/01/17 1302

## 2017-01-01 NOTE — ED Notes (Signed)
Attempted to call report

## 2017-01-01 NOTE — Progress Notes (Signed)
ANTICOAGULATION CONSULT NOTE - Initial Consult  Pharmacy Consult for Heparin Indication: chest pain/ACS  No Known Allergies  Patient Measurements: Height: 6\' 2"  (188 cm) Weight: 235 lb (106.6 kg) IBW/kg (Calculated) : 82.2 Heparin Dosing Weight: 103.9 kg  Vital Signs: Temp: 97.8 F (36.6 C) (06/04 1431) Temp Source: Oral (06/04 1431) BP: 153/92 (06/04 1431) Pulse Rate: 64 (06/04 1431)  Labs:  Recent Labs  01/01/17 1038 01/01/17 1131 01/01/17 1310 01/01/17 1546 01/01/17 1816  HGB 14.7  --   --   --   --   HCT 42.2  --   --   --   --   PLT 200  --   --   --   --   APTT  --  28  --   --   --   LABPROT  --  13.1  --   --   --   INR  --  0.99  --   --   --   HEPARINUNFRC  --   --   --   --  0.40  CREATININE 0.91  --   --   --   --   TROPONINI 0.11*  --  0.58* 2.26*  --     Estimated Creatinine Clearance: 112.3 mL/min (by C-G formula based on SCr of 0.91 mg/dL).   Medical History: Past Medical History:  Diagnosis Date  . Alcohol abuse    a. up to a 12 pack of beer/day.  . Arthritis   . Hypertension   . Marijuana abuse    a. occasional.  . Tobacco abuse      Assessment: 61 yo male here with chest pain starting on heparin drip. No oral anticoagulants on PTA med list.   Goal of Therapy:  Heparin level 0.3-0.7 units/ml Monitor platelets by anticoagulation protocol: Yes   Plan:  Stat aPTT and INR ordered Heparin bolus 4000 units IV x1 then 1300 units/hr (=13 ml/hr) Heparin level in 6h after start of heparin drip CBC in AM  6/4 1842 HL therapeutic x 1. Continue current rate. Will recheck HL in 6 hours.  Pharmacy will continue to follow.   Carola FrostNathan A , Pharm.D., BCPS Clinical Pharmacist 01/01/2017,7:26 PM

## 2017-01-01 NOTE — ED Notes (Signed)
EMS called to transport patient to [facility] ED 

## 2017-01-01 NOTE — Progress Notes (Signed)
CRITICAL VALUE ALERT  Critical Value:  Troponin 2.26 Date & Time Notied: 01/01/17,  1643 Provider Notified: Told Cardiology who is in the room  Orders Received/Actions taken: none

## 2017-01-01 NOTE — Consult Note (Signed)
Name: Bryan Ibarra. MRN: [id number] DOB: 11-28-1955    ADMISSION DATE:  01/01/2017 CONSULTATION DATE: 0s6/10/2016  REFERRING MD :  Dr. Elisabeth Pigeon  CHIEF COMPLAINT: Chest Pain and NSTEMI  BRIEF PATIENT DESCRIPTION:  61 yo male admitted 06/4 with chest pain secondary to NSTEMI cardiac catheterization results revealing 90% occlusion of the proximal LAD with successful angioplasty and drug-eluting stent placement   SIGNIFICANT EVENTS  06/4-Pt admitted to telemetry unit then transferred to [facility] Unit post cardiac cath   STUDIES:  Cardiac Catheterization 06/4>>Severe one-vessel coronary artery disease with 90% thrombotic stenosis in the proximal LAD which is the culprit for non-ST elevation myocardial infarction. No other significant disease. Normal LV systolic function and mildly elevated left ventricular end-diastolic pressure Successful angioplasty and drug-eluting stent placement to the proximal LAD.  HISTORY OF PRESENT ILLNESS:   This is a 60 yo male with a PMH of Tobacco Abuse, Marijuana Abuse, HTN, Arthritis, and ETOH Abuse (drinks up to a 12 pack of beer/day). He presented to [facility] ER 06/4 with c/o of mid chest pain/pressure "like someone sitting on his chest" and bilateral arm tingling with numbness onset of symptoms 45 minutes prior to arrival to the ER and occurred while he was push mowing his yard. He also c/o nausea, shortness of breath, and a mild headache.  The pt took an over-the-counter BC powder prior to mowing the yard due to chronic aches and pains.  The pt went to urgent care due to his symptoms and was transported to [facility] ER via EMS for further evaluation.  In the ER the pt continued to have 10/10 central chest pain/epigastric pain.  EKG revealed normal sinus rhythm at 65 bpm, narrow QRS, normal axis, normal intervals, no ST changes and occasional PVC's with initial troponin of 0.11.  He was subsequently admitted to the telemetry unit by hospitalist team for further  evaluation and treatment.  He was transported to cardiac cath lab 06/4 and results revealed 90% occlusion of proximal LAD with placement of drug-eluting stent post cath pt transferred to ICU for further monitoring and treatment.  PAST MEDICAL HISTORY :   has a past medical history of Alcohol abuse; Arthritis; Hypertension; Marijuana abuse; and Tobacco abuse.  has a past surgical history that includes Knee surgery and Appendectomy. Prior to Admission medications   Medication Sig Start Date End Date Taking? Authorizing Provider  Melatonin 10 MG CAPS Take 1 capsule by mouth at bedtime.   Yes [provider]  albuterol (PROVENTIL HFA;VENTOLIN HFA) 108 (90 Base) MCG/ACT inhaler Inhale 2 puffs into the lungs every 4 (four) hours as needed for wheezing or shortness of breath. Patient not taking: Reported on 01/01/2017 11/20/16   Hassan Rowan, MD  chlorpheniramine-HYDROcodone [facility] ER) 10-8 MG/5ML SUER Take 5 mLs by mouth every 12 (twelve) hours as needed. Patient not taking: Reported on 01/01/2017 11/20/16   Hassan Rowan, MD  predniSONE (STERAPRED UNI-PAK 21 TAB) 10 MG (21) TBPK tablet Sig 6 tablet day 1, 5 tablets day 2, 4 tablets day 3,,3tablets day 4, 2 tablets day 5, 1 tablet day 6 take all tablets orally Patient not taking: Reported on 01/01/2017 11/20/16   Hassan Rowan, MD   No Known Allergies  FAMILY HISTORY:  family history includes CAD in his father and paternal grandfather. SOCIAL HISTORY:  reports that he has been smoking Cigarettes.  He has been smoking about 1.50 packs per day. He has never used smokeless tobacco. He reports that he drinks alcohol. He reports  that he uses drugs, including Marijuana.  REVIEW OF SYSTEMS:  Positives in BOLD  Constitutional: Negative for fever, chills, weight loss, malaise/fatigue and diaphoresis.  HENT: Negative for hearing loss, ear pain, nosebleeds, congestion, sore throat, neck pain, tinnitus and ear discharge.   Eyes: Negative for  blurred vision, double vision, photophobia, pain, discharge and redness.  Respiratory: cough, hemoptysis, sputum production, shortness of breath, wheezing and stridor.   Cardiovascular: chest pain, palpitations, orthopnea, claudication, leg swelling and PND.  Gastrointestinal: heartburn, nausea, vomiting, abdominal pain, diarrhea, constipation, blood in stool and melena.  Genitourinary: Negative for dysuria, urgency, frequency, hematuria and flank pain.  Musculoskeletal: Negative for myalgias, back pain, joint pain and falls.  Skin: Negative for itching and rash.  Neurological: Negative for dizziness, tingling, tremors, sensory change, speech change, focal weakness, seizures, loss of consciousness, weakness and headaches.  Endo/Heme/Allergies: Negative for environmental allergies and polydipsia. Does not bruise/bleed easily.  SUBJECTIVE:  Pt states he is still having 5/10 chest pain and mild shortness of breath   VITAL SIGNS: Temp:  [97.8 F (36.6 C)] 97.8 F (36.6 C) (06/04 1431) Pulse Rate:  [56-81] 64 (06/04 1431) Resp:  [11-26] 18 (06/04 1431) BP: (134-177)/(80-135) 153/92 (06/04 1431) SpO2:  [84 %-100 %] 100 % (06/04 1842) Weight:  [104.3 kg (230 lb)-106.6 kg (235 lb)] 106.6 kg (235 lb) (06/04 1038)  PHYSICAL EXAMINATION: General: well developed, well nourished, Caucasian male, NAD Neuro: alert and oriented, follows commands  HEENT: supple, no JVD Cardiovascular: nsr, s1s2, no M/R/G Lungs: clear throughout, even, non labored  Abdomen: +BS x4, soft, non tender, non distended  Musculoskeletal: trace edema bilateral lower extremities, moves all extremities Skin: no rashes or lesions, right radial TR band no bleeding or hematoma    Recent Labs Lab 01/01/17 1038  NA 137  K 4.0  CL 106  CO2 23  BUN 23*  CREATININE 0.91  GLUCOSE 100*    Recent Labs Lab 01/01/17 1038  HGB 14.7  HCT 42.2  WBC 6.9  PLT 200   Dg Chest 2 View  Result Date: 01/01/2017 CLINICAL DATA:   Chest pain EXAM: CHEST  2 VIEW COMPARISON:  November 20, 2016 FINDINGS: There is no edema or consolidation. The heart size and pulmonary vascularity are normal. No adenopathy. There is aortic atherosclerosis. There is left carotid artery calcification. IMPRESSION: Aortic atherosclerosis. Left carotid artery calcification. No edema or consolidation. Electronically Signed   By: Bretta Bang III M.D.   On: 01/01/2017 11:05   Ct Angio Chest/abd/pel For Dissection W And/or Wo Contrast  Result Date: 01/01/2017 CLINICAL DATA:  Chest and abdominal pain EXAM: CT ANGIOGRAPHY CHEST, ABDOMEN AND PELVIS TECHNIQUE: Initially, axial CT images were obtained through the chest without intravenous contrast material administration. Multidetector CT imaging through the chest, abdomen and pelvis was performed using the standard protocol during bolus administration of intravenous contrast. Multiplanar reconstructed images and MIPs were obtained and reviewed to evaluate the vascular anatomy. CONTRAST:  100 mL Isovue 370 nonionic COMPARISON:  Chest radiograph January 01, 2017 FINDINGS: CTA CHEST FINDINGS Cardiovascular: There is no evident intramural hematoma on noncontrast enhanced study. There is no appreciable thoracic aortic aneurysm or dissection. The visualized great vessels appear normal except for slight atherosclerotic calcification in the right common carotid artery. There is calcification in the aortic arch region. There are foci of coronary artery calcification. Pericardium is not appreciably thickened. No pulmonary embolus is demonstrated. Mediastinum/Nodes: Visualized thyroid appears unremarkable. There is no appreciable thoracic adenopathy. There is a small hiatal  hernia. Lungs/Pleura: On axial slice 52 series 7, there is a 5 mm nodular opacity in the medial segment of the right lower lobe. There is no lung edema or consolidation. No pleural effusion or pleural thickening. No pneumothorax. Musculoskeletal: There old healed  rib fractures on the left. There are no blastic or lytic bone lesions. Review of the MIP images confirms the above findings. CTA ABDOMEN AND PELVIS FINDINGS VASCULAR Aorta: There is no abdominal aortic aneurysm or dissection. There is atherosclerotic calcification and slight peripheral thrombus in the distal abdominal aorta. Celiac: There is no celiac artery aneurysm or dissection. This vessel and its major branches are patent. SMA: There is no superior mesenteric artery aneurysm or dissection. The specimen its major branches appear patent. Renals: There is a single renal artery on each side. The right main renal artery bifurcates slightly beyond its origin. There is no aneurysm or dissection involving the renal artery and its branches. No fibromuscular dysplasia evident. Renal arteries in their peripheral branches appear patent. IMA: Inferior mesenteric artery and its major branches appear patent. Inferior mesenteric artery and its branches show no aneurysm or dissection. Inflow: There is atherosclerotic calcification at the origin of the left common iliac artery with 40-50% diameter stenosis at the origin of the left common iliac artery. There is minimal atherosclerotic calcification in the right common iliac artery. There is mild calcification at the origin of the left internal iliac artery. Scattered foci of calcification are noted in each internal iliac artery. The other major pelvic vessels appear patent. No aneurysm or dissection evident in the major pelvic arterial vessels. Note that there is mild calcification in the periphery of the right common femoral artery with perhaps 15-20% diameter stenosis. Veins: No obvious venous abnormality within the limitations of this arterial phase study. Review of the MIP images confirms the above findings. NON-VASCULAR Hepatobiliary: No focal liver lesions are appreciable. Gallbladder wall is not appreciably thickened. There is no biliary duct dilatation. Pancreas: No  pancreatic mass or inflammatory focus. Spleen: No splenic lesions are evident. Adrenals/Urinary Tract: Right adrenal appears normal. There is mild generalized left adrenal hypertrophy. Kidneys bilaterally show no evident mass or hydronephrosis on either side. There is no renal or ureteral calculus on either side. Urinary bladder is midline with wall thickness within normal limits. Stomach/Bowel: There is no bowel wall or mesenteric thickening. There are scattered sigmoid and distal descending colonic diverticulum without diverticulitis. There is no bowel obstruction. No free air or portal venous air. Lymphatic: There is no evident adenopathy in the abdomen or pelvis. Reproductive: Prostate and seminal vesicles appear normal in size and contour. No evident pelvic mass. Other: Appendix absent. No abscess or ascites evident in the abdomen or pelvis. There is a minimal ventral hernia containing only fat. Musculoskeletal: There is mild degenerative change in the lumbar spine. There is moderate spinal stenosis at L4-5 due to diffuse disc protrusion and bony hypertrophy. There are no blastic or lytic bone lesions. No intramuscular or abdominal wall lesion. Review of the MIP images confirms the above findings. IMPRESSION: CT angiogram chest: No thoracic aortic aneurysm or dissection. No pulmonary embolus. Scattered foci of atherosclerotic calcification in the aorta as well as foci of coronary artery calcification. No lung edema or consolidation. 5 mm nodular opacity right lower lobe medial segment. No follow-up needed if patient is low-risk. Non-contrast chest CT can be considered in 12 months if patient is high-risk. This recommendation follows the consensus statement: Guidelines for Management of Incidental Pulmonary Nodules Detected on  CT Images: From the Fleischner Society 2017; Radiology 2017; [phone]. No adenopathy. Subacute to chronic appearing rib fractures on the left. CT angiogram abdomen and CT angiogram  pelvis: No aneurysm or dissection in the aorta or major mesenteric/ pelvic arterial vessels. Scattered areas of atherosclerosis, most notably in the distal aorta and origin of the left common iliac artery. No high-grade obstruction. Spinal stenosis at L4-5, multifactorial. Left adrenal hypertrophy.  Significance of this finding uncertain. Scattered left-sided colonic diverticular without diverticulitis. No bowel obstruction. No abscess. Appendix absent. Minimal ventral hernia containing only fat. Electronically Signed   By: Bretta Bang III M.D.   On: 01/01/2017 12:12    ASSESSMENT / PLAN: NSTEMI secondary to 90% occlusion proximal LAD with drug-eluting stent placement 06/4 Chest pain secondary to NSTEMI Hypertension  Hx: ETOH Abuse and Tobacco Abuse  P: Supplemental O2 to maintain O2 sats >92% Prn bronchodilator therapy  Echo pending Troponin's q4hrs Cardiology consulted appreciate input EKG post cardiac catheterization Continuous telemetry monitoring  Urine drug screen pending  NS @106 .6 ml/hr Continue aspirin, atorvastatin, carvedilol, and brilinta  Prn morphine and sublingual nitroglycerin for pain management  Subq lovenox for VTE prophylaxis Monitor for s/sx of bleeding CIWA protocol Smoking and ETOH Cessation counseling provided   Sonda Rumble, AGNP  Pulmonary/Critical Care Pager [phone] (please enter 7 digits) PCCM Consult Pager [phone] (please enter 7 digits)  Billy Fischer, MD PCCM service Mobile [phone] Pager [phone] 01/02/2017 11:07 AM

## 2017-01-01 NOTE — ED Notes (Signed)
Pt given sandwich tray and ginger ale. 

## 2017-01-01 NOTE — ED Provider Notes (Signed)
[facility] ____________________________________________  Time seen: Approximately 10:05 AM  I have reviewed the triage vital signs and the nursing notes.   HISTORY  Chief Complaint Chest Pain   HPI Bryan Catalinaorman F Kruczek Jr. is a 61 y.o. male  presenting for evaluation of mid chest chest pain and chest pressure that started personally 45 minutes prior to arrival while push mowing his yard. Patient states that he felt fine first thing this morning. Patient states accompany the pain is bilateral arm tingling and numbness sensation. Denies any fall or injury. Denies any recent sickness other than upper respiratory infection.  denies previous sensation of current. Patient states to 10 mid chest pain and described as a "often sitting on my chest ". Reports some accompanying nausea and shortness of breath. Denies any other pain radiation. Reports mild headache. Denies dizziness or vision changes. Denies extremity weakness or gait changes. Denies any previous cardiac history is. Patient states that he does have a history of "borderline" hypertension but has never been started on medications. Patient reports that he is a smoker. Reports orthopedic knee surgeries and appendectomy. States father and grandfather with cardiac history including open heart surgery and heart attack. Patient states that he took an over-the-counter BC powder just before starting to mow the yard due to normal aches and pains.    Past Medical History:  Diagnosis Date  . Arthritis   . Hypertension     There are no active problems to display for this patient.   Past Surgical History:  Procedure Laterality Date  . APPENDECTOMY    . KNEE SURGERY       No current facility-administered medications for this encounter.   Current Outpatient Prescriptions:  None  Allergies Patient has no known allergies.  family history Father MI "heart surgery" Grandfather "heart issues"   Social History Social History    Substance Use Topics  . Smoking status: Current Every Day Smoker    Packs/day: 2.00    Types: Cigarettes  . Smokeless tobacco: Never Used  . Alcohol use Yes     Comment: several times weekly    Review of Systems Constitutional: No fever/chills Eyes: No visual changes.. Cardiovascular: Denies chest pain. Respiratory: As above.  Gastrointestinal: No abdominal pain.   Genitourinary: Negative for dysuria. Musculoskeletal: Negative for back pain.  ____________________________________________   PHYSICAL EXAM:  VITAL SIGNS: ED Triage Vitals  Enc Vitals Group     BP 01/01/17 0957 (!) 166/120     Pulse Rate 01/01/17 0957 81     Resp 01/01/17 0957 (!) 26     Temp --      Temp src --      SpO2 01/01/17 0957 100 %     Weight 01/01/17 0958 230 lb (104.3 kg)     Height 01/01/17 0958 6\' 2"  (1.88 m)     Head Circumference --      Peak Flow --      Pain Score 01/01/17 0958 (S) 10     Pain Loc --      Pain Edu? --      Excl. in GC? --     Constitutional: Alert and oriented.  Cardiovascular: Normal rate, regular rhythm. Grossly normal heart sounds.  Good peripheral circulation. Respiratory: Normal respiratory effort without tachypnea nor retractions. Breath sounds are clear and equal bilaterally. No wheezes, rales, rhonchi. Gastrointestinal: Soft and nontender. No distention. Normal Bowel sounds.  Musculoskeletal:Steady gait.      Right lower leg:  No tenderness or  edema.      Left lower leg:  No tenderness or edema.  Neurologic:  Normal speech and language. No gross focal neurologic deficits are appreciated. Speech is normal. No gait instability.  Skin:  Skin is warm, clammy. Psychiatric: Mood and affect are normal. Speech and behavior are normal. Patient exhibits appropriate insight and judgment   ___________________________________________   LABS (all labs ordered are listed, but only abnormal results are displayed)  Labs Reviewed - No data to  display ____________________________________________  EKG  ED ECG REPORT I, Renford Dills, the attending provider, personally viewed and interpreted this ECG.   Date: 01/01/2017  EKG Time:0958  Rate: 78  Rhythm: sinus rhythm  Axis: normal  Intervals:none  ST&T Change: mild ST elevation noted in v3 v5  ____________________________________________  RADIOLOGY  No results found. ____________________________________________   PROCEDURES Procedures    INITIAL IMPRESSION / ASSESSMENT AND PLAN / ED COURSE  Pertinent labs & imaging results that were available during my care of the patient were reviewed by me and considered in my medical decision making (see chart for details).  Patient with 45 minute onset midsternal chest pain with radiation to arms and comforting numbness and tingling sensation, nausea and shortness of breath. Acute onset. Denies previous history. Concern for acute cardiac event. EMS to transfer patient to [facility]. Tammy Sours RN charge nurse called and given report. Patient stable at time of discharge. No aspirin given as patient had just taken [facility] powder. Patient verbalized understanding and agreed to plan.   ____________________________________________   FINAL CLINICAL IMPRESSION(S) / ED DIAGNOSES  Final diagnoses:  Chest pain, unspecified type     Discharge Medication List as of 01/01/2017 10:21 AM      Note: This dictation was prepared with Dragon dictation along with smaller phrase technology. Any transcriptional errors that result from this process are unintentional.         Renford Dills, NP 01/01/17 1044

## 2017-01-02 ENCOUNTER — Inpatient Hospital Stay (HOSPITAL_COMMUNITY)
Admit: 2017-01-02 | Discharge: 2017-01-02 | Disposition: A | Discharge status: Home / Self Care | Payer: Medicaid Other | Attending: Pulmonary Disease | Admitting: Pulmonary Disease

## 2017-01-02 ENCOUNTER — Encounter: Payer: Self-pay | Admitting: Cardiovascular Disease

## 2017-01-02 DIAGNOSIS — I2510 Atherosclerotic heart disease of native coronary artery without angina pectoris: Secondary | ICD-10-CM

## 2017-01-02 DIAGNOSIS — I10 Essential (primary) hypertension: Secondary | ICD-10-CM

## 2017-01-02 DIAGNOSIS — E785 Hyperlipidemia, unspecified: Secondary | ICD-10-CM

## 2017-01-02 LAB — ECHOCARDIOGRAM COMPLETE
Height: 74 in
Weight: 3336.88 oz

## 2017-01-02 LAB — BASIC METABOLIC PANEL
Anion gap: 4 — ABNORMAL LOW (ref 5–15)
BUN: 13 mg/dL (ref 6–20)
CHLORIDE: 108 mmol/L (ref 101–111)
CO2: 27 mmol/L (ref 22–32)
CREATININE: 0.85 mg/dL (ref 0.61–1.24)
Calcium: 8.5 mg/dL — ABNORMAL LOW (ref 8.9–10.3)
GFR calc Af Amer: >60 mL/min (ref >60)
Glucose, Bld: 99 mg/dL (ref 65–99)
Potassium: 4.2 mmol/L (ref 3.5–5.1)
SODIUM: 139 mmol/L (ref 135–145)

## 2017-01-02 LAB — CBC
HCT: 40.5 % (ref 40.0–52.0)
HEMOGLOBIN: 13.7 g/dL (ref 13.0–18.0)
MCH: 31.1 pg (ref 26.0–34.0)
MCHC: 33.9 g/dL (ref 32.0–36.0)
MCV: 92 fL (ref 80.0–100.0)
Platelets: 197 10*3/uL (ref 150–440)
RBC: 4.4 MIL/uL (ref 4.40–5.90)
RDW: 13.8 % (ref 11.5–14.5)
WBC: 5.7 10*3/uL (ref 3.8–10.6)

## 2017-01-02 LAB — HIV ANTIBODY (ROUTINE TESTING W REFLEX): HIV SCREEN 4TH GENERATION: NONREACTIVE

## 2017-01-02 LAB — HEMOGLOBIN A1C
Hgb A1c MFr Bld: 5.3 % (ref 4.8–5.6)
Mean Plasma Glucose: 105 mg/dL

## 2017-01-02 LAB — TROPONIN I: Troponin I: 5.02 ng/mL (ref <0.03)

## 2017-01-02 MED ORDER — ACETAMINOPHEN 325 MG PO TABS
650.0000 mg | ORAL_TABLET | Freq: Four times a day (QID) | ORAL | Status: DC | PRN
  Administered 2017-01-02 (×2): 650 mg via ORAL
  Filled 2017-01-02 (×2): qty 2

## 2017-01-02 MED ORDER — LISINOPRIL 10 MG PO TABS
10.0000 mg | ORAL_TABLET | Freq: Every day | ORAL | Status: DC
  Administered 2017-01-02 – 2017-01-03 (×2): 10 mg via ORAL
  Filled 2017-01-02 (×2): qty 1

## 2017-01-02 NOTE — Progress Notes (Signed)
[employer] - [place] [facility]   PATIENT NAME: Bryan Ibarra    MR#:  [id number]  DATE OF BIRTH:  11-Sep-1955  SUBJECTIVE:  CHIEF COMPLAINT:   Chief Complaint  Patient presents with  . Chest Pain   no chest pain   REVIEW OF SYSTEMS:    Review of Systems  Constitutional: Negative for chills and fever.  HENT: Negative for sore throat.   Eyes: Negative for blurred vision, double vision and pain.  Respiratory: Negative for cough, hemoptysis, shortness of breath and wheezing.   Cardiovascular: Negative for chest pain, palpitations, orthopnea and leg swelling.  Gastrointestinal: Negative for abdominal pain, constipation, diarrhea, heartburn, nausea and vomiting.  Genitourinary: Negative for dysuria and hematuria.  Musculoskeletal: Negative for back pain and joint pain.  Skin: Negative for rash.  Neurological: Negative for sensory change, speech change, focal weakness and headaches.  Endo/Heme/Allergies: Does not bruise/bleed easily.  Psychiatric/Behavioral: Negative for depression. The patient is not nervous/anxious.     DRUG ALLERGIES:  No Known Allergies  VITALS:  Blood pressure 136/85, pulse 60, temperature 97.9 F (36.6 C), temperature source Oral, resp. rate 17, height 6\' 2"  (1.88 m), weight 93.4 kg (205 lb 14.4 oz), SpO2 100 %.  PHYSICAL EXAMINATION:   Physical Exam  GENERAL:  61 y.o.-year-old patient lying in the bed with no acute distress.  EYES: Pupils equal, round, reactive to light and accommodation. No scleral icterus. Extraocular muscles intact.  HEENT: Head atraumatic, normocephalic. Oropharynx and nasopharynx clear.  NECK:  Supple, no jugular venous distention. No thyroid enlargement, no tenderness.  LUNGS: Normal breath sounds bilaterally, no wheezing, rales, rhonchi. No use of accessory muscles of respiration.  CARDIOVASCULAR: S1, S2 normal. No murmurs, rubs, or gallops.  ABDOMEN: Soft, nontender, nondistended. Bowel sounds present. No  organomegaly or mass.  EXTREMITIES: No cyanosis, clubbing or edema b/l.    NEUROLOGIC: Cranial nerves II through XII are intact. No focal Motor or sensory deficits b/l.   PSYCHIATRIC: The patient is alert and oriented x 3.  SKIN: No obvious rash, lesion, or ulcer.   LABORATORY PANEL:   CBC  Recent Labs Lab 01/02/17 0554  WBC 5.7  HGB 13.7  HCT 40.5  PLT 197   ------------------------------------------------------------------------------------------------------------------ Chemistries   Recent Labs Lab 01/01/17 1038  01/01/17 2040 01/02/17 0554  NA 137  --  137 139  K 4.0  --  4.2 4.2  CL 106  --  104 108  CO2 23  --  25 27  GLUCOSE 100*  --  98 99  BUN 23*  --  18 13  CREATININE 0.91  --  0.97 0.85  CALCIUM 9.0  --  8.7* 8.5*  MG  --   < > 2.1  --   AST 23  --   --   --   ALT 24  --   --   --   ALKPHOS 61  --   --   --   BILITOT 1.0  --   --   --   < > = values in this interval not displayed. ------------------------------------------------------------------------------------------------------------------  Cardiac Enzymes  Recent Labs Lab 01/02/17 0554  TROPONINI 5.02*   ------------------------------------------------------------------------------------------------------------------  RADIOLOGY:  Dg Chest 2 View  Result Date: 01/01/2017 CLINICAL DATA:  Chest pain EXAM: CHEST  2 VIEW COMPARISON:  November 20, 2016 FINDINGS: There is no edema or consolidation. The heart size and pulmonary vascularity are normal. No adenopathy. There is aortic atherosclerosis. There is left carotid artery calcification. IMPRESSION:  Aortic atherosclerosis. Left carotid artery calcification. No edema or consolidation. Electronically Signed   By: Bretta BangWilliam  Woodruff III M.D.   On: 01/01/2017 11:05   Ct Angio Chest/abd/pel For Dissection W And/or Wo Contrast  Result Date: 01/01/2017 CLINICAL DATA:  Chest and abdominal pain EXAM: CT ANGIOGRAPHY CHEST, ABDOMEN AND PELVIS TECHNIQUE:  Initially, axial CT images were obtained through the chest without intravenous contrast material administration. Multidetector CT imaging through the chest, abdomen and pelvis was performed using the standard protocol during bolus administration of intravenous contrast. Multiplanar reconstructed images and MIPs were obtained and reviewed to evaluate the vascular anatomy. CONTRAST:  100 mL Isovue 370 nonionic COMPARISON:  Chest radiograph January 01, 2017 FINDINGS: CTA CHEST FINDINGS Cardiovascular: There is no evident intramural hematoma on noncontrast enhanced study. There is no appreciable thoracic aortic aneurysm or dissection. The visualized great vessels appear normal except for slight atherosclerotic calcification in the right common carotid artery. There is calcification in the aortic arch region. There are foci of coronary artery calcification. Pericardium is not appreciably thickened. No pulmonary embolus is demonstrated. Mediastinum/Nodes: Visualized thyroid appears unremarkable. There is no appreciable thoracic adenopathy. There is a small hiatal hernia. Lungs/Pleura: On axial slice 52 series 7, there is a 5 mm nodular opacity in the medial segment of the right lower lobe. There is no lung edema or consolidation. No pleural effusion or pleural thickening. No pneumothorax. Musculoskeletal: There old healed rib fractures on the left. There are no blastic or lytic bone lesions. Review of the MIP images confirms the above findings. CTA ABDOMEN AND PELVIS FINDINGS VASCULAR Aorta: There is no abdominal aortic aneurysm or dissection. There is atherosclerotic calcification and slight peripheral thrombus in the distal abdominal aorta. Celiac: There is no celiac artery aneurysm or dissection. This vessel and its major branches are patent. SMA: There is no superior mesenteric artery aneurysm or dissection. The specimen its major branches appear patent. Renals: There is a single renal artery on each side. The right main  renal artery bifurcates slightly beyond its origin. There is no aneurysm or dissection involving the renal artery and its branches. No fibromuscular dysplasia evident. Renal arteries in their peripheral branches appear patent. IMA: Inferior mesenteric artery and its major branches appear patent. Inferior mesenteric artery and its branches show no aneurysm or dissection. Inflow: There is atherosclerotic calcification at the origin of the left common iliac artery with 40-50% diameter stenosis at the origin of the left common iliac artery. There is minimal atherosclerotic calcification in the right common iliac artery. There is mild calcification at the origin of the left internal iliac artery. Scattered foci of calcification are noted in each internal iliac artery. The other major pelvic vessels appear patent. No aneurysm or dissection evident in the major pelvic arterial vessels. Note that there is mild calcification in the periphery of the right common femoral artery with perhaps 15-20% diameter stenosis. Veins: No obvious venous abnormality within the limitations of this arterial phase study. Review of the MIP images confirms the above findings. NON-VASCULAR Hepatobiliary: No focal liver lesions are appreciable. Gallbladder wall is not appreciably thickened. There is no biliary duct dilatation. Pancreas: No pancreatic mass or inflammatory focus. Spleen: No splenic lesions are evident. Adrenals/Urinary Tract: Right adrenal appears normal. There is mild generalized left adrenal hypertrophy. Kidneys bilaterally show no evident mass or hydronephrosis on either side. There is no renal or ureteral calculus on either side. Urinary bladder is midline with wall thickness within normal limits. Stomach/Bowel: There is no bowel wall  or mesenteric thickening. There are scattered sigmoid and distal descending colonic diverticulum without diverticulitis. There is no bowel obstruction. No free air or portal venous air. Lymphatic:  There is no evident adenopathy in the abdomen or pelvis. Reproductive: Prostate and seminal vesicles appear normal in size and contour. No evident pelvic mass. Other: Appendix absent. No abscess or ascites evident in the abdomen or pelvis. There is a minimal ventral hernia containing only fat. Musculoskeletal: There is mild degenerative change in the lumbar spine. There is moderate spinal stenosis at L4-5 due to diffuse disc protrusion and bony hypertrophy. There are no blastic or lytic bone lesions. No intramuscular or abdominal wall lesion. Review of the MIP images confirms the above findings. IMPRESSION: CT angiogram chest: No thoracic aortic aneurysm or dissection. No pulmonary embolus. Scattered foci of atherosclerotic calcification in the aorta as well as foci of coronary artery calcification. No lung edema or consolidation. 5 mm nodular opacity right lower lobe medial segment. No follow-up needed if patient is low-risk. Non-contrast chest CT can be considered in 12 months if patient is high-risk. This recommendation follows the consensus statement: Guidelines for Management of Incidental Pulmonary Nodules Detected on CT Images: From the Fleischner Society 2017; Radiology 2017; 284:228-243. No adenopathy. Subacute to chronic appearing rib fractures on the left. CT angiogram abdomen and CT angiogram pelvis: No aneurysm or dissection in the aorta or major mesenteric/ pelvic arterial vessels. Scattered areas of atherosclerosis, most notably in the distal aorta and origin of the left common iliac artery. No high-grade obstruction. Spinal stenosis at L4-5, multifactorial. Left adrenal hypertrophy.  Significance of this finding uncertain. Scattered left-sided colonic diverticular without diverticulitis. No bowel obstruction. No abscess. Appendix absent. Minimal ventral hernia containing only fat. Electronically Signed   By: Bretta Bang III M.D.   On: 01/01/2017 12:12     ASSESSMENT AND PLAN:   * Non-ST  elevation MI Drug eluting stent to LAD. No chest pain today. On aspirin, Brillinta , statin and beta blocker.  * Hypertension   Carvedilol started  * Chronic smoking   Counseled to quit smoking  All the records are reviewed and case discussed with Care Management/Social Workerr. Management plans discussed with the patient, family and they are in agreement.  CODE STATUS: FULL CODE  DVT Prophylaxis: SCDs  TOTAL TIME TAKING CARE OF THIS PATIENT: 35 minutes.   POSSIBLE D/C IN 1-2 DAYS, DEPENDING ON CLINICAL CONDITION.  Milagros Loll R M.D on 01/02/2017 at 3:46 PM  Between 7am to 6pm - Pager - [phone]  After 6pm go to [email or web address] - password [email or web address] [facility]  [facility] [place] [facility]  Office  [phone]  CC: Primary care physician; Derwood Kaplan, MD  Note: This dictation was prepared with Dragon dictation along with smaller phrase technology. Any transcriptional errors that result from this process are unintentional.

## 2017-01-02 NOTE — Care Management (Signed)
Cardiac cath resulted in PCI.  Patient to discharge home on Brilinta.  He  verbally confirms his medicaid is active. Provided Brilinta coupon for 30 day free trial for medicaid recipient.   Marden NobleBrilinta is on medicaid drug formulary and does not require prior auth.

## 2017-01-02 NOTE — Progress Notes (Signed)
Patient Name: Bryan Ibarra. Date of Encounter: 01/02/2017  Primary Cardiologist: new - consult by [facility] Problem List     Principal Problem:   NSTEMI (non-ST elevated myocardial infarction) (HCC)     Subjective   No further chest pain. Some increased SOB from baseline. Has not ambulated yet. Tolerating medications without issues. Had 11 beats of NSVT overnight with rare episode of ventricular trigeminy. K+ 4.2. Mg++ 2.1. Troponin peaked at 5.49-->5.02. Echo pending. Normal LV EF by LV gram. Renal function stable. HGB stable.   Inpatient Medications    Scheduled Meds: . aspirin  81 mg Oral Daily  . atorvastatin  80 mg Oral q1800  . carvedilol  6.25 mg Oral BID WC  . enoxaparin (LOVENOX) injection  40 mg Subcutaneous Q24H  . lisinopril  10 mg Oral Daily  . sodium chloride flush  3 mL Intravenous Q12H  . ticagrelor  90 mg Oral BID   Continuous Infusions:  PRN Meds: albuterol, docusate sodium, hydrALAZINE, morphine injection, nitroGLYCERIN, sodium chloride flush   Vital Signs    Vitals:   01/02/17 0400 01/02/17 0500 01/02/17 0600 01/02/17 0700  BP: 119/74 114/78 (!) 162/96 (!) 150/88  Pulse: (!) 56 (!) 54 60 (!) 50  Resp: 16 16 19 15   Temp:      TempSrc:      SpO2: 96% 97% 98% 97%  Weight:      Height:        Intake/Output Summary (Last 24 hours) at 01/02/17 0813 Last data filed at 01/02/17 0728  Gross per 24 hour  Intake          1132.25 ml  Output             2375 ml  Net         -1242.75 ml   Filed Weights   01/01/17 1038 01/01/17 2000  Weight: 235 lb (106.6 kg) 208 lb 8.9 oz (94.6 kg)    Physical Exam    GEN: Well nourished, well developed, in no acute distress.  HEENT: Grossly normal.  Neck: Supple, no JVD, carotid bruits, or masses. Cardiac: RRR, no murmurs, rubs, or gallops. No clubbing, cyanosis, edema.  Radials/DP/PT 2+ and equal bilaterally. Right radial cath site without bleeding, bruising, swelling, TTP, or erythema. Pulse  2+. Respiratory:  Respirations regular and unlabored, clear to auscultation bilaterally. GI: Soft, nontender, nondistended, BS + x 4. MS: no deformity or atrophy. Skin: warm and dry, no rash. Neuro:  Strength and sensation are intact. Psych: AAOx3.  Normal affect.  Labs    CBC  Recent Labs  01/01/17 1038 01/02/17 0554  WBC 6.9 5.7  HGB 14.7 13.7  HCT 42.2 40.5  MCV 91.3 92.0  PLT 200 197   Basic Metabolic Panel  Recent Labs  01/01/17 1816 01/01/17 2040 01/02/17 0554  NA  --  137 139  K  --  4.2 4.2  CL  --  104 108  CO2  --  25 27  GLUCOSE  --  98 99  BUN  --  18 13  CREATININE  --  0.97 0.85  CALCIUM  --  8.7* 8.5*  MG 2.3 2.1  --    Liver Function Tests  Recent Labs  01/01/17 1038  AST 23  ALT 24  ALKPHOS 61  BILITOT 1.0  PROT 7.1  ALBUMIN 4.2    Recent Labs  01/01/17 1038  LIPASE 33   Cardiac Enzymes  Recent Labs  01/01/17  1546 01/01/17 2040 01/02/17 0554  TROPONINI 2.26* 5.49* 5.02*   BNP Invalid input(s): POCBNP D-Dimer No results for input(s): DDIMER in the last 72 hours. Hemoglobin A1C  Recent Labs  01/01/17 1310  HGBA1C 5.3   Fasting Lipid Panel  Recent Labs  01/01/17 1310  CHOL 143  HDL 64  LDLCALC 72  TRIG 37  CHOLHDL 2.2   Thyroid Function Tests No results for input(s): TSH, T4TOTAL, T3FREE, THYROIDAB in the last 72 hours.  Invalid input(s): FREET3  Telemetry    NSR, 60s bpm, occasional PVCs with rare ventricular trigeminy and 11 beats of NSVT - Personally Reviewed  ECG    n/a - Personally Reviewed  Radiology    Dg Chest 2 View  Result Date: 01/01/2017 IMPRESSION: Aortic atherosclerosis. Left carotid artery calcification. No edema or consolidation. Electronically Signed   By: Bretta Bang III M.D.   On: 01/01/2017 11:05   Ct Angio Chest/abd/pel For Dissection W And/or Wo Contrast  Result Date: 01/01/2017 IMPRESSION: CT angiogram chest: No thoracic aortic aneurysm or dissection. No pulmonary  embolus. Scattered foci of atherosclerotic calcification in the aorta as well as foci of coronary artery calcification. No lung edema or consolidation. 5 mm nodular opacity right lower lobe medial segment. No follow-up needed if patient is low-risk. Non-contrast chest CT can be considered in 12 months if patient is high-risk. This recommendation follows the consensus statement: Guidelines for Management of Incidental Pulmonary Nodules Detected on CT Images: From the Fleischner Society 2017; Radiology 2017; 284:228-243. No adenopathy. Subacute to chronic appearing rib fractures on the left. CT angiogram abdomen and CT angiogram pelvis: No aneurysm or dissection in the aorta or major mesenteric/ pelvic arterial vessels. Scattered areas of atherosclerosis, most notably in the distal aorta and origin of the left common iliac artery. No high-grade obstruction. Spinal stenosis at L4-5, multifactorial. Left adrenal hypertrophy.  Significance of this finding uncertain. Scattered left-sided colonic diverticular without diverticulitis. No bowel obstruction. No abscess. Appendix absent. Minimal ventral hernia containing only fat. Electronically Signed   By: Bretta Bang III M.D.   On: 01/01/2017 12:12    Cardiac Studies   LHC 12/01/2016: Conclusion     The left ventricular systolic function is normal.  LV end diastolic pressure is mildly elevated.  The left ventricular ejection fraction is 55-65% by visual estimate.  A STENT RESOLUTE ONYX 3.0X18 drug eluting stent was successfully placed.  Prox LAD lesion, 90 %stenosed.  Post intervention, there is a 0% residual stenosis.   1. Severe one-vessel coronary artery disease with 90% thrombotic stenosis in the proximal LAD which is the culprit for non-ST elevation myocardial infarction. No other significant disease. 2. Normal LV systolic function and mildly elevated left ventricular end-diastolic pressure 3. Successful angioplasty and drug-eluting stent  placement to the proximal LAD.  Recommendations: Dual antiplatelet therapy for at least one year. Aggressive treatment of risk factors, smoking cessation in cardiac rehabilitation.    Patient Profile     61 y.o. male with history of HTN, remote cocaine abuse, ongoing tobacco abuse and ETOH abuse who presented to [facility] with NSTEMI-->PCI/DEs to pLAD.   Assessment & Plan    1. NSTEMI: -No further chest pain -Troponin peaked 5/49, down trending this AM -Continue DAPT with ASA 81 mg daily Brilinta 90 mg bid -If he continues to note dyspnea, consider changing Brilinta to Effient prior to d/c or at follow up -Continue Coreg 6.25 mg bid (bradycardia precludes titration at this time) -Cardiac rehab -Await TTE -Continue Lipitor  2. NSVT: -Occurred overnight, no further ectopy noted -Coreg as above -Will hold starting amio at this time unless he has further ectopy -Mg++ and K+ at goal -Echo as above -No indication for LifeVest at this time  3. HTN: -Add lisinopril 10 mg daily -Will need bmet in 1 week -Coreg as above  4. HLD: -Lipitor  5. Polysubstance abuse: -Cessation advised  Signed, Eula ListenRyan , PA-C [facility] Pager: [phone] 01/02/2017, 8:13 AM

## 2017-01-02 NOTE — Progress Notes (Signed)
*  PRELIMINARY RESULTS* Echocardiogram 2D Echocardiogram has been performed.  Cristela BlueHege,  01/02/2017, 3:00 PM

## 2017-01-02 NOTE — Plan of Care (Signed)
Problem: Safety: Goal: Ability to remain free from injury will improve Outcome: Progressing Fall precautions in place, non skid socks  Problem: Pain Managment: Goal: General experience of comfort will improve Outcome: Not Progressing Continues to complain of headache, prn medications   Problem: Tissue Perfusion: Goal: Risk factors for ineffective tissue perfusion will decrease Outcome: Progressing Brilinta po bid  Problem: Cardiovascular: Goal: Ability to achieve and maintain adequate cardiovascular perfusion will improve Outcome: Progressing S/P pci, cath site benign

## 2017-01-02 NOTE — Progress Notes (Signed)
Patient had 11 beat run of Dennard SchaumannVtach; Dana, NP notified.

## 2017-01-02 NOTE — Progress Notes (Signed)
No complaints of chest pain. He is on no continuous infusions. No evidence of alcohol withdrawal. I have placed orders for transfer to telemetry. After transfer,PCCM will sign off. Please call if we can be of further assistance  Billy Fischeravid , MD PCCM service Mobile [phone] Pager [phone] 01/02/2017 11:08 AM

## 2017-01-02 NOTE — Progress Notes (Signed)
61 yo wm transferred out from ICU s/p NSTEMI.  A&O x3, no distress on ra.  Cardiac monitor placed on pt and verified with Carney BernJean, RN.  Pt denies chest pain at this time.  Complaining of continuous headache, will continue to monitor.  Lungs clear bil.  Dry dressing to rt radial cath site.  Site benign.  SL lt fa/ac flushes well.  Oriented to room and surroundings, POC reviewed with pt.  Denies need at this time. CB in reach, SR up x 2.

## 2017-01-03 ENCOUNTER — Telehealth: Payer: Self-pay | Admitting: Cardiovascular Disease

## 2017-01-03 DIAGNOSIS — I25118 Atherosclerotic heart disease of native coronary artery with other forms of angina pectoris: Secondary | ICD-10-CM

## 2017-01-03 MED ORDER — ALBUTEROL SULFATE HFA 108 (90 BASE) MCG/ACT IN AERS: 2.0000 | INHALATION_SPRAY | RESPIRATORY_TRACT | 0 refills | Status: DC | PRN

## 2017-01-03 MED ORDER — ATORVASTATIN CALCIUM 80 MG PO TABS: 80.0000 mg | ORAL_TABLET | Freq: Every day | ORAL | 0 refills | Status: DC

## 2017-01-03 MED ORDER — ASPIRIN EC 81 MG PO TBEC: 81.0000 mg | DELAYED_RELEASE_TABLET | Freq: Every day | ORAL | 0 refills | Status: DC

## 2017-01-03 MED ORDER — TICAGRELOR 90 MG PO TABS: 90.0000 mg | ORAL_TABLET | Freq: Two times a day (BID) | ORAL | 0 refills | Status: DC

## 2017-01-03 MED ORDER — CARVEDILOL 6.25 MG PO TABS: 6.2500 mg | ORAL_TABLET | Freq: Two times a day (BID) | ORAL | 0 refills | Status: DC

## 2017-01-03 MED ORDER — LISINOPRIL 10 MG PO TABS: 10.0000 mg | ORAL_TABLET | Freq: Every day | ORAL | 0 refills | Status: DC

## 2017-01-03 NOTE — Discharge Instructions (Addendum)
Stay off work or any exertional activity for 4 weeks.  Heart healthy diet  QUIT SMOKING   Coronary Angiogram With Stent, Care After This sheet gives you information about how to care for yourself after your procedure. Your health care provider may also give you more specific instructions. If you have problems or questions, contact your health care provider. What can I expect after the procedure? After your procedure, it is common to have:  Bruising in the area where a small, thin tube (catheter) was inserted. This usually fades within 1-2 weeks.  Blood collecting in the tissue (hematoma) that may be painful to the touch. It should usually decrease in size and tenderness within 1-2 weeks.  Follow these instructions at home: Insertion area care  Do not take baths, swim, or use a hot tub until your health care provider approves.  You may shower 24-48 hours after the procedure or as directed by your health care provider.  Follow instructions from your health care provider about how to take care of your incision. Make sure you: ? Wash your hands with soap and water before you change your bandage (dressing). If soap and water are not available, use hand sanitizer. ? Change your dressing as told by your health care provider. ? Leave stitches (sutures), skin glue, or adhesive strips in place. These skin closures may need to stay in place for 2 weeks or longer. If adhesive strip edges start to loosen and curl up, you may trim the loose edges. Do not remove adhesive strips completely unless your health care provider tells you to do that.  Remove the bandage (dressing) and gently wash the catheter insertion site with plain soap and water.  Pat the area dry with a clean towel. Do not rub the area, because that may cause bleeding.  Do not apply powder or lotion to the incision area.  Check your incision area every day for signs of infection. Check for: ? More redness, swelling, or pain. ? More  fluid or blood. ? Warmth. ? Pus or a bad smell. Activity  Do not drive for 24 hours if you were given a medicine to help you relax (sedative).  Do not lift anything that is heavier than 10 lb (4.5 kg) for 5 days after your procedure or as directed by your health care provider.  Ask your health care provider when it is okay for you: ? To return to work or school. ? To resume usual physical activities or sports. ? To resume sexual activity. Eating and drinking  Eat a heart-healthy diet. This should include plenty of fresh fruits and vegetables.  Avoid the following types of food: ? Food that is high in salt. ? Canned or highly processed food. ? Food that is high in saturated fat or sugar. ? [employer].  Limit alcohol intake to no more than 1 drink a day for non-pregnant women and 2 drinks a day for men. One drink equals 12 oz of beer, 5 oz of wine, or 1 oz of hard liquor. Lifestyle  Do not use any products that contain nicotine or tobacco, such as cigarettes and e-cigarettes. If you need help quitting, ask your health care provider.  Take steps to manage and control your weight.  Get regular exercise.  Manage your blood pressure.  Manage other health problems, such as diabetes. General instructions  Take over-the-counter and prescription medicines only as told by your health care provider. Blood thinners may be prescribed after your procedure to improve  blood flow through the stent.  If you need an MRI after your heart stent has been placed, be sure to tell the health care provider who orders the MRI that you have a heart stent.  Keep all follow-up visits as directed by your health care provider. This is important. Contact a health care provider if:  You have a fever.  You have chills.  You have increased bleeding from the catheter insertion area. Hold pressure on the area. Get help right away if:  You develop chest pain or shortness of breath.  You feel faint or  you pass out.  You have unusual pain at the catheter insertion area.  You have redness, warmth, or swelling at the catheter insertion area.  You have drainage (other than a small amount of blood on the dressing) from the catheter insertion area.  The catheter insertion area is bleeding, and the bleeding does not stop after 30 minutes of holding steady pressure on the area.  You develop bleeding from any other place, such as from your rectum. There may be bright red blood in your urine or stool, or it may appear as black, tarry stool. This information is not intended to replace advice given to you by your health care provider. Make sure you discuss any questions you have with your health care provider. Document Released: 02/03/2005 Document Revised: 04/13/2016 Document Reviewed: 04/13/2016 Elsevier Interactive Patient Education  2018 [employer] Radial Site Care Refer to this sheet in the next few weeks. These instructions provide you with information about caring for yourself after your procedure. Your health care provider may also give you more specific instructions. Your treatment has been planned according to current medical practices, but problems sometimes occur. Call your health care provider if you have any problems or questions after your procedure. What can I expect after the procedure? After your procedure, it is typical to have the following:  Bruising at the radial site that usually fades within 1-2 weeks.  Blood collecting in the tissue (hematoma) that may be painful to the touch. It should usually decrease in size and tenderness within 1-2 weeks.  Follow these instructions at home:  Take medicines only as directed by your health care provider.  You may shower 24-48 hours after the procedure or as directed by your health care provider. Remove the bandage (dressing) and gently wash the site with plain soap and water. Pat the area dry with a clean towel. Do not rub the site,  because this may cause bleeding.  Do not take baths, swim, or use a hot tub until your health care provider approves.  Check your insertion site every day for redness, swelling, or drainage.  Do not apply powder or lotion to the site.  Do not flex or bend the affected arm for 24 hours or as directed by your health care provider.  Do not push or pull heavy objects with the affected arm for 24 hours or as directed by your health care provider.  Do not lift over 10 lb (4.5 kg) for 5 days after your procedure or as directed by your health care provider.  Ask your health care provider when it is okay to: ? Return to work or school. ? Resume usual physical activities or sports. ? Resume sexual activity.  Do not drive home if you are discharged the same day as the procedure. Have someone else drive you.  You may drive 24 hours after the procedure unless otherwise instructed by your health  care provider.  Do not operate machinery or power tools for 24 hours after the procedure.  If your procedure was done as an outpatient procedure, which means that you went home the same day as your procedure, a responsible adult should be with you for the first 24 hours after you arrive home.  Keep all follow-up visits as directed by your health care provider. This is important. Contact a health care provider if:  You have a fever.  You have chills.  You have increased bleeding from the radial site. Hold pressure on the site. Get help right away if:  You have unusual pain at the radial site.  You have redness, warmth, or swelling at the radial site.  You have drainage (other than a small amount of blood on the dressing) from the radial site.  The radial site is bleeding, and the bleeding does not stop after 30 minutes of holding steady pressure on the site.  Your arm or hand becomes pale, cool, tingly, or numb. This information is not intended to replace advice given to you by your health care  provider. Make sure you discuss any questions you have with your health care provider. Document Released: 08/19/2010 Document Revised: 12/23/2015 Document Reviewed: 02/02/2014 Elsevier Interactive Patient Education  2018 [employer].

## 2017-01-03 NOTE — Telephone Encounter (Signed)
TCM... Pt was in hospital seen by Dr Kirke CorinArida Being discharged today  Is coming in on  01/15/17 to see Eula Listenyan Dunn.

## 2017-01-03 NOTE — Telephone Encounter (Signed)
Left voicemail message to call back  

## 2017-01-03 NOTE — Progress Notes (Signed)
Patient discharged via wheelchair and private vehicle. IV removed and catheter intact. All discharge instructions given and patient verbalizes understanding. Tele removed and returned. No prescriptions given to patient No distress noted.   

## 2017-01-03 NOTE — Progress Notes (Signed)
Patient Name: Bryan Catalinaorman F Murch Jr. Date of Encounter: 01/03/2017  Primary Cardiologist: new [facility]  Hospital Problem List     Principal Problem:   NSTEMI (non-ST elevated myocardial infarction) (HCC)     Subjective   No further chest pain. SOB improved. Has ambulated without issues. Tolerating medications without issues. BP improved. Improved ventricular ectopy, with occasional PVCs. Asymptomatic.   Inpatient Medications    Scheduled Meds: . aspirin  81 mg Oral Daily  . atorvastatin  80 mg Oral q1800  . carvedilol  6.25 mg Oral BID WC  . enoxaparin (LOVENOX) injection  40 mg Subcutaneous Q24H  . lisinopril  10 mg Oral Daily  . sodium chloride flush  3 mL Intravenous Q12H  . ticagrelor  90 mg Oral BID   Continuous Infusions:  PRN Meds: acetaminophen, albuterol, docusate sodium, hydrALAZINE, morphine injection, nitroGLYCERIN, sodium chloride flush   Vital Signs    Vitals:   01/02/17 1710 01/02/17 2009 01/03/17 0557 01/03/17 0905  BP: 135/79 134/76 107/62 140/86  Pulse: (!) 55 (!) 52 (!) 50 66  Resp:   18   Temp:  98 F (36.7 C)    TempSrc:  Oral    SpO2:  97% 95%   Weight:      Height:        Intake/Output Summary (Last 24 hours) at 01/03/17 0933 Last data filed at 01/03/17 0730  Gross per 24 hour  Intake              720 ml  Output             2300 ml  Net            -1580 ml   Filed Weights   01/01/17 1038 01/01/17 2000 01/02/17 1514  Weight: 235 lb (106.6 kg) 208 lb 8.9 oz (94.6 kg) 205 lb 14.4 oz (93.4 kg)    Physical Exam    GEN: Well nourished, well developed, in no acute distress.  HEENT: Grossly normal.  Neck: Supple, no JVD, carotid bruits, or masses. Cardiac: RRR, no murmurs, rubs, or gallops. No clubbing, cyanosis, edema.  Radials/DP/PT 2+ and equal bilaterally. Right radial cath site without bleeding, bruising, swelling, TTP, or erythema. Pulse 2+. Respiratory:  Respirations regular and unlabored, clear to auscultation  bilaterally. GI: Soft, nontender, nondistended, BS + x 4. MS: no deformity or atrophy. Skin: warm and dry, no rash. Neuro:  Strength and sensation are intact. Psych: AAOx3.  Normal affect.  Labs    CBC  Recent Labs  01/01/17 1038 01/02/17 0554  WBC 6.9 5.7  HGB 14.7 13.7  HCT 42.2 40.5  MCV 91.3 92.0  PLT 200 197   Basic Metabolic Panel  Recent Labs  01/01/17 1816 01/01/17 2040 01/02/17 0554  NA  --  137 139  K  --  4.2 4.2  CL  --  104 108  CO2  --  25 27  GLUCOSE  --  98 99  BUN  --  18 13  CREATININE  --  0.97 0.85  CALCIUM  --  8.7* 8.5*  MG 2.3 2.1  --    Liver Function Tests  Recent Labs  01/01/17 1038  AST 23  ALT 24  ALKPHOS 61  BILITOT 1.0  PROT 7.1  ALBUMIN 4.2    Recent Labs  01/01/17 1038  LIPASE 33   Cardiac Enzymes  Recent Labs  01/01/17 1546 01/01/17 2040 01/02/17 0554  TROPONINI 2.26* 5.49* 5.02*  BNP Invalid input(s): POCBNP D-Dimer No results for input(s): DDIMER in the last 72 hours. Hemoglobin A1C  Recent Labs  01/01/17 1310  HGBA1C 5.3   Fasting Lipid Panel  Recent Labs  01/01/17 1310  CHOL 143  HDL 64  LDLCALC 72  TRIG 37  CHOLHDL 2.2   Thyroid Function Tests No results for input(s): TSH, T4TOTAL, T3FREE, THYROIDAB in the last 72 hours.  Invalid input(s): FREET3  Telemetry    NSR, 70s bpm, occasional PVCs - Personally Reviewed  ECG    n/a - Personally Reviewed  Radiology    Dg Chest 2 View  Result Date: 01/01/2017 IMPRESSION: Aortic atherosclerosis. Left carotid artery calcification. No edema or consolidation. Electronically Signed   By: Bretta Bang III M.D.   On: 01/01/2017 11:05   Ct Angio Chest/abd/pel For Dissection W And/or Wo Contrast  Result Date: 01/01/2017 IMPRESSION: CT angiogram chest: No thoracic aortic aneurysm or dissection. No pulmonary embolus. Scattered foci of atherosclerotic calcification in the aorta as well as foci of coronary artery calcification. No lung edema  or consolidation. 5 mm nodular opacity right lower lobe medial segment. No follow-up needed if patient is low-risk. Non-contrast chest CT can be considered in 12 months if patient is high-risk. This recommendation follows the consensus statement: Guidelines for Management of Incidental Pulmonary Nodules Detected on CT Images: From the Fleischner Society 2017; Radiology 2017; 284:228-243. No adenopathy. Subacute to chronic appearing rib fractures on the left. CT angiogram abdomen and CT angiogram pelvis: No aneurysm or dissection in the aorta or major mesenteric/ pelvic arterial vessels. Scattered areas of atherosclerosis, most notably in the distal aorta and origin of the left common iliac artery. No high-grade obstruction. Spinal stenosis at L4-5, multifactorial. Left adrenal hypertrophy.  Significance of this finding uncertain. Scattered left-sided colonic diverticular without diverticulitis. No bowel obstruction. No abscess. Appendix absent. Minimal ventral hernia containing only fat. Electronically Signed   By: Bretta Bang III M.D.   On: 01/01/2017 12:12    Cardiac Studies   LHC 01/01/2017: Conclusion     The left ventricular systolic function is normal.  LV end diastolic pressure is mildly elevated.  The left ventricular ejection fraction is 55-65% by visual estimate.  A STENT RESOLUTE ONYX 3.0X18 drug eluting stent was successfully placed.  Prox LAD lesion, 90 %stenosed.  Post intervention, there is a 0% residual stenosis.   1. Severe one-vessel coronary artery disease with 90% thrombotic stenosis in the proximal LAD which is the culprit for non-ST elevation myocardial infarction. No other significant disease. 2. Normal LV systolic function and mildly elevated left ventricular end-diastolic pressure 3. Successful angioplasty and drug-eluting stent placement to the proximal LAD.  Recommendations: Dual antiplatelet therapy for at least one year. Aggressive treatment of risk  factors, smoking cessation in cardiac rehabilitation.    TTE 01/02/2017: Study Conclusions  - Left ventricle: The cavity size was normal. Wall thickness was   normal. Systolic function was normal. The estimated ejection   fraction was in the range of 60% to 65%. Wall motion was normal;   there were no regional wall motion abnormalities. Doppler   parameters are consistent with abnormal left ventricular   relaxation (grade 1 diastolic dysfunction). - Right ventricle: The cavity size was normal. Wall thickness was   normal. Systolic function was normal. - No significant valvular abnormalities.  Patient Profile     61 y.o. male with history of HTN, remote cocaine abuse, ongoing tobacco abuse and ETOH abuse who presented to [facility] with  NSTEMI-->PCI/DEs to pLAD.  Assessment & Plan    1. NSTEMI: -No further chest pain -Troponin peaked 5/49, down trending this AM -Continue DAPT with ASA 81 mg daily Brilinta 90 mg bid for at leat the next 12 months, importance of compliance was discussed in detail, he understands  -If he continues to note dyspnea, consider changing Brilinta to Effient at follow up -Continue Coreg 6.25 mg bid (bradycardia precludes titration at this time) -Cardiac rehab -TTE as above -Continue Lipitor  2. NSVT: -Improved -Coreg as above -Will hold starting amio at this time unless he has further ectopy -Mg++ and K+ at goal -Echo as above -No indication for LifeVest at this time  3. HTN: -Improved -Continue lisinopril 10 mg daily -Will need bmet in 1 week -Coreg as above  4. HLD: -Lipitor  5. Polysubstance abuse: -Cessation advised   Signed, Carola Frost [facility] Pager: [phone] 01/03/2017, 9:33 AM  Attending Note Patient seen and examined, agree with detailed note above,  Patient presentation and plan discussed on rounds.   Notes reviewed, cardiac catheterization yesterday Drug-eluting stents to his proximal LAD for 90%  stenosis Recovered well, no significant bruising or hematoma at right radial artery access site Has been ambulating with no chest pain or shortness of breath Feels ready to go home  Long discussion concerning cardiac catheterization findings, procedure, recovery Discussed importance of staying on his antiplatelet medication Coupon has been provided  On physical exam no JVD, lungs clear to auscultation, heart sounds regular with no murmurs appreciated, abdomen soft nontender, no significant lower extremity edema  Lab work reviewed Peak troponin 5.5, trending downwards  ---cad, NSTEMI Stent to his proximal LAD Medications with reviewed with him Stressed importance of follow-up in clinic with Dr. Kirke Corin   Greater than 50% was spent in counseling and coordination of care with patient Total encounter time 25 minutes or more   Signed: Dossie Arbour  M.D., Ph.D. [facility]

## 2017-01-03 NOTE — Telephone Encounter (Signed)
[facility]  Patient understands to follow up with provider Eula Listenyan Dunn PA on 01/15/17 at 1[facility]30PM [facility]. Patient understands discharge instructions? Yes Patient understands medications and regiment? Yes Patient understands to bring all medications to this visit? Yes  Spoke with patient and confirmed upcoming appointment and he verbalized understanding with no further questions.

## 2017-01-04 NOTE — Discharge Summary (Signed)
[employer] - [place] [facility]   PATIENT NAME: Bryan Ibarra    MR#:  [id number]  DATE OF BIRTH:  June 16, 1956  DATE OF ADMISSION:  01/01/2017 ADMITTING PHYSICIAN: Altamese Dilling, MD  DATE OF DISCHARGE: 01/03/2017 11:29 AM  PRIMARY CARE PHYSICIAN: No primary care provider on file.   ADMISSION DIAGNOSIS:  NSTEMI (non-ST elevated myocardial infarction) (HCC) [I21.4]  DISCHARGE DIAGNOSIS:  Principal Problem:   NSTEMI (non-ST elevated myocardial infarction) (HCC)   SECONDARY DIAGNOSIS:   Past Medical History:  Diagnosis Date  . Alcohol abuse    a. up to a 12 pack of beer/day.  . Arthritis   . Hypertension   . Marijuana abuse    a. occasional.  . Tobacco abuse      ADMITTING HISTORY  HISTORY OF PRESENT ILLNESS: Bryan Ibarra  is a 61 y.o. male with a known history of Arthritis and hypertension- does not follow with any physician as outpatient since long time. She does labor work and Musician. He has lot of physical activities every day and denies any complaints with that. Today while he was mowing the lawn he had pain in his chest and going to his left arm, he also continue to feel short of breath with that and sweating. Concerned with this he came to emergency room. He denies any cough or sputum production or palpitation. He felt slightly better after receiving nitroglycerin tablets sublingually in ER. His troponin was noted 0.11 so concerning his typical symptoms started on heparin IV drip and given to hospitalist team for further management of non-ST elevation MI.  HOSPITAL COURSE:   * Non-ST elevation MI Admit to ICU. Had cardiac catheterization with Drug eluting stent to LAD. No chest pain today. On aspirin, Brillinta, statin and Coreg, lisinopril By the day of discharge she is chest pain-free and ambulated in the hallway. Prescription sent to his pharmacy. Referred to cardiac rehabilitation.  * Hypertension Carvedilol started  *  Chronic smoking Counseled to quit smoking  Stable for discharge home  CONSULTS OBTAINED:  Treatment Team:  Iran Ouch, MD  DRUG ALLERGIES:  No Known Allergies  DISCHARGE MEDICATIONS:   Discharge Medication List as of 01/03/2017 11:05 AM    START taking these medications   Details  aspirin EC 81 MG tablet Take 1 tablet (81 mg total) by mouth daily., Starting Wed 01/03/2017, Normal    atorvastatin (LIPITOR) 80 MG tablet Take 1 tablet (80 mg total) by mouth daily at 6 PM., Starting Wed 01/03/2017, Normal    carvedilol (COREG) 6.25 MG tablet Take 1 tablet (6.25 mg total) by mouth 2 (two) times daily with a meal., Starting Wed 01/03/2017, Normal    lisinopril (PRINIVIL,ZESTRIL) 10 MG tablet Take 1 tablet (10 mg total) by mouth daily., Starting Thu 01/04/2017, Normal    ticagrelor (BRILINTA) 90 MG TABS tablet Take 1 tablet (90 mg total) by mouth 2 (two) times daily., Starting Wed 01/03/2017, Normal      CONTINUE these medications which have CHANGED   Details  albuterol (PROVENTIL HFA;VENTOLIN HFA) 108 (90 Base) MCG/ACT inhaler Inhale 2 puffs into the lungs every 4 (four) hours as needed for wheezing or shortness of breath., Starting Wed 01/03/2017, Normal      CONTINUE these medications which have NOT CHANGED   Details  Melatonin 10 MG CAPS Take 1 capsule by mouth at bedtime., Historical Med      STOP taking these medications     chlorpheniramine-HYDROcodone (TUSSIONEX PENNKINETIC ER) 10-8 MG/5ML SUER  predniSONE (STERAPRED UNI-PAK 21 TAB) 10 MG (21) TBPK tablet         Today   VITAL SIGNS:  Blood pressure 140/86, pulse 66, temperature 98 F (36.7 C), temperature source Oral, resp. rate 18, height 6\' 2"  (1.88 m), weight 93.4 kg (205 lb 14.4 oz), SpO2 95 %.  I/O:  No intake or output data in the 24 hours ending 01/04/17 1356  PHYSICAL EXAMINATION:  Physical Exam  GENERAL:  61 y.o.-year-old patient lying in the bed with no acute distress.  LUNGS: Normal breath  sounds bilaterally, no wheezing, rales,rhonchi or crepitation. No use of accessory muscles of respiration.  CARDIOVASCULAR: S1, S2 normal. No murmurs, rubs, or gallops.  ABDOMEN: Soft, non-tender, non-distended. Bowel sounds present. No organomegaly or mass.  NEUROLOGIC: Moves all 4 extremities. PSYCHIATRIC: The patient is alert and oriented x 3.  SKIN: No obvious rash, lesion, or ulcer.   DATA REVIEW:   CBC  Recent Labs Lab 01/02/17 0554  WBC 5.7  HGB 13.7  HCT 40.5  PLT 197    Chemistries   Recent Labs Lab 01/01/17 1038  01/01/17 2040 01/02/17 0554  NA 137  --  137 139  K 4.0  --  4.2 4.2  CL 106  --  104 108  CO2 23  --  25 27  GLUCOSE 100*  --  98 99  BUN 23*  --  18 13  CREATININE 0.91  --  0.97 0.85  CALCIUM 9.0  --  8.7* 8.5*  MG  --   < > 2.1  --   AST 23  --   --   --   ALT 24  --   --   --   ALKPHOS 61  --   --   --   BILITOT 1.0  --   --   --   < > = values in this interval not displayed.  Cardiac Enzymes  Recent Labs Lab 01/02/17 0554  TROPONINI 5.02*    Microbiology Results  Results for orders placed or performed during the hospital encounter of 01/01/17  MRSA PCR Screening     Status: None   Collection Time: 01/01/17  7:54 PM  Result Value Ref Range Status   MRSA by PCR NEGATIVE NEGATIVE Final    Comment:        The GeneXpert MRSA Assay (FDA approved for NASAL specimens only), is one component of a comprehensive MRSA colonization surveillance program. It is not intended to diagnose MRSA infection nor to guide or monitor treatment for MRSA infections.     RADIOLOGY:  No results found.  Follow up with PCP in 1 week.  Management plans discussed with the patient, family and they are in agreement.  CODE STATUS:  Code Status History    Date Active Date Inactive Code Status Order ID Comments User Context   01/01/2017  2:44 PM 01/03/2017  2:35 PM Full Code [id number]  Altamese DillingVachhani, Vaibhavkumar, MD Inpatient      TOTAL TIME TAKING CARE  OF THIS PATIENT ON DAY OF DISCHARGE: more than 30 minutes.   Milagros LollSudini,  R M.D on 01/04/2017 at 1:56 PM  Between 7am to 6pm - Pager - [phone]  After 6pm go to [email or web address] - password [email or web address] [facility]  [facility] [place] [facility]  Office  [phone]  CC: Primary care physician; No primary care provider on file.  Note: This dictation was prepared with Dragon dictation along with smaller phrase technology. Any transcriptional errors that result from this process  are unintentional.

## 2017-01-15 ENCOUNTER — Ambulatory Visit (INDEPENDENT_AMBULATORY_CARE_PROVIDER_SITE_OTHER): Payer: Medicaid Other | Admitting: Physician Assistant

## 2017-01-15 ENCOUNTER — Encounter: Payer: Self-pay | Admitting: Physician Assistant

## 2017-01-15 VITALS — BP 116/68 | HR 63 | Ht 74.0 in | Wt 204.5 lb

## 2017-01-15 DIAGNOSIS — R197 Diarrhea, unspecified: Secondary | ICD-10-CM

## 2017-01-15 DIAGNOSIS — R918 Other nonspecific abnormal finding of lung field: Secondary | ICD-10-CM

## 2017-01-15 DIAGNOSIS — F1910 Other psychoactive substance abuse, uncomplicated: Secondary | ICD-10-CM

## 2017-01-15 DIAGNOSIS — I2510 Atherosclerotic heart disease of native coronary artery without angina pectoris: Secondary | ICD-10-CM

## 2017-01-15 DIAGNOSIS — I10 Essential (primary) hypertension: Secondary | ICD-10-CM | POA: Diagnosis not present

## 2017-01-15 DIAGNOSIS — E782 Mixed hyperlipidemia: Secondary | ICD-10-CM | POA: Diagnosis not present

## 2017-01-15 DIAGNOSIS — I214 Non-ST elevation (NSTEMI) myocardial infarction: Secondary | ICD-10-CM | POA: Diagnosis not present

## 2017-01-15 MED ORDER — CARVEDILOL 6.25 MG PO TABS: 6.2500 mg | ORAL_TABLET | Freq: Two times a day (BID) | ORAL | 3 refills | Status: DC

## 2017-01-15 MED ORDER — LISINOPRIL 10 MG PO TABS: 10.0000 mg | ORAL_TABLET | Freq: Every day | ORAL | 3 refills | Status: DC

## 2017-01-15 MED ORDER — TICAGRELOR 90 MG PO TABS: 90.0000 mg | ORAL_TABLET | Freq: Two times a day (BID) | ORAL | 3 refills | Status: DC

## 2017-01-15 MED ORDER — ATORVASTATIN CALCIUM 80 MG PO TABS: 80.0000 mg | ORAL_TABLET | Freq: Every day | ORAL | 3 refills | Status: DC

## 2017-01-15 NOTE — Progress Notes (Signed)
Cardiology Office Note Date:  01/15/2017  Patient ID:  Bryan Ibarra., DOB [birth year], MRN [id number] PCP:  Patient, No Pcp Per  Cardiologist:  Dr. Kirke Corin, MD    Chief Complaint: Hospital follow up  History of Present Illness: Bryan Ibarra. is a 61 y.o. male with history of hypertension, polysubstance abuse with tobacco, alcohol, marijuana, and remote cocaine abuse who presents for hospital follow-up after recent admission to [facility] from 6/4 through 6/64 NSTEMI.  Prior to his admission he did not have any previously known cardiac history. 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 Patient was admitted to [facility] on 6/4 after developing severe substernal chest pain radiating down both arms with associated nausea, dyspnea, and paresthesias of the fingers while using a push mower on his own yard. He initially presented to [facility] urgent care, followed by transport via EMS to [facility] ED. Upon his arrival to [facility] ED he continued to note chest pain. EKG with borderline ST elevation and frequent PVCs. He was started on heparin drip and pain was treated with nitroglycerin and morphine. Troponin peaked at 5.49. Chest x-ray showed aortic atherosclerosis with left carotid artery calcification. No edema or consolidation noted. CTA chest showed no thoracic aortic aneurysm or dissection. No pulmonary embolus. Scattered foci of atherosclerotic calcification in the aorta as well as foci of CAD were noted. A 5 mm nodular opacity in the right lower lobe medial segment was noted. He underwent cardiac catheterization that showed severe 1 vessel CAD with 90% thrombotic stenosis in the proximal LAD which was felt to be the culprit for the patient's non-STEMI. He underwent successful PCI/DES with a resolute onyx drug-eluting stent (3.0 x 18 mm). There was 0% residual stenosis. LVEF estimated at 55-65% by visual  estimate. LVEDP was mildly elevated. Echocardiogram post intervention showed EF 60-65%, normal wall motion, grade 1 diastolic dysfunction, RV cavity size was normal with normal wall thickness and normal RV systolic function. There were no significant valvular abnormalities. Post intervention he did have rare episode of NSVT at 11 beats. That improved prior to discharge. He was asymptomatic. Discharge labs showed serum creatinine 0.85, potassium 4.2, unremarkable CBC, magnesium 2.1, hemoglobin A1c 5.3, LDL 72. He was discharged on dual antiplatelet therapy with ASA 81 mg daily, Brilinta 90 mg twice a day, along with Lipitor 80 mg daily, carvedilol 6.25 mg twice a day, and lisinopril 10 mg daily.  He comes in doing well today. No further chest pain or symptoms concerning for ischemia. He does still note some SOB, though this is much improved from when he was in the hospital. He notes this SOB more so in the evening (take his AM Brilinta with coffee). Has not missed any doses of DAPT. Not checking his BP at home. Eager to get back to work. No issues from his right radial cath site. He does note onset of diarrhea since his discharge, that is now improving. Non-bloody.    Past Medical History:  Diagnosis Date  . Alcohol abuse    a. up to a 12 pack of beer/day.  . Arthritis   . CAD in native artery    a. LHC 6/18: severe 1 vessel CAD with 90% thrombotic stenosis in the proximal LAD which was felt to be the culprit for the patient's non-STEMI. He underwent successful PCI/DES with a resolute onyx drug-eluting stent (3.0 x 18 mm). There was 0% residual  stenosis. LVEF estimated at 55-65% by visual estimate. LVEDP was mildly elevated  . H/O echocardiogram    a. echo 6/18: EF 60-65%, normal wall motion, grade 1 diastolic dysfunction, RV cavity size was normal with normal wall thickness and normal RV systolic function. There were no significant valvular abnormalities  . Hypertension   . Marijuana abuse    a.  occasional.  . Tobacco abuse     Past Surgical History:  Procedure Laterality Date  . APPENDECTOMY    . CORONARY STENT INTERVENTION N/A 01/01/2017   Procedure: Coronary Stent Intervention;  Surgeon: Iran Ouch, MD;  Location: [facility] INVASIVE CV LAB;  Service: Cardiovascular;  Laterality: N/A;  . KNEE SURGERY    . LEFT HEART CATH AND CORONARY ANGIOGRAPHY N/A 01/01/2017   Procedure: Left Heart Cath and Coronary Angiography;  Surgeon: Iran Ouch, MD;  Location: [facility] INVASIVE CV LAB;  Service: Cardiovascular;  Laterality: N/A;    Current Outpatient Prescriptions  Medication Sig Dispense Refill  . albuterol (PROVENTIL HFA;VENTOLIN HFA) 108 (90 Base) MCG/ACT inhaler Inhale 2 puffs into the lungs every 4 (four) hours as needed for wheezing or shortness of breath. 1 Inhaler 0  . aspirin EC 81 MG tablet Take 1 tablet (81 mg total) by mouth daily. 30 tablet 0  . atorvastatin (LIPITOR) 80 MG tablet Take 1 tablet (80 mg total) by mouth daily at 6 PM. 30 tablet 0  . carvedilol (COREG) 6.25 MG tablet Take 1 tablet (6.25 mg total) by mouth 2 (two) times daily with a meal. 60 tablet 0  . lisinopril (PRINIVIL,ZESTRIL) 10 MG tablet Take 1 tablet (10 mg total) by mouth daily. 30 tablet 0  . Melatonin 10 MG CAPS Take 1 capsule by mouth at bedtime.    . ticagrelor (BRILINTA) 90 MG TABS tablet Take 1 tablet (90 mg total) by mouth 2 (two) times daily. 60 tablet 0   No current facility-administered medications for this visit.     Allergies:   Patient has no known allergies.   Social History:  The patient  reports that he has been smoking Cigarettes.  He has been smoking about 0.25 packs per day. He has never used smokeless tobacco. He reports that he drinks alcohol. He reports that he uses drugs, including Marijuana.   Family History:  The patient's family history includes CAD in his father and paternal grandfather.  ROS:   Review of Systems  Constitutional: Negative for chills, diaphoresis,  fever, malaise/fatigue and weight loss.  HENT: Negative for congestion.   Eyes: Negative for discharge and redness.  Respiratory: Positive for shortness of breath. Negative for cough, hemoptysis, sputum production and wheezing.   Cardiovascular: Negative for chest pain, palpitations, orthopnea, claudication, leg swelling and PND.  Gastrointestinal: Negative for abdominal pain, blood in stool, heartburn, melena, nausea and vomiting.  Genitourinary: Negative for hematuria.  Musculoskeletal: Negative for falls and myalgias.  Skin: Negative for rash.  Neurological: Negative for dizziness, tingling, tremors, sensory change, speech change, focal weakness, loss of consciousness and weakness.  Endo/Heme/Allergies: Does not bruise/bleed easily.  Psychiatric/Behavioral: Negative for substance abuse. The patient is not nervous/anxious.   All other systems reviewed and are negative.    PHYSICAL EXAM:  VS:  BP 116/68 (BP Location: Left Arm, Patient Position: Sitting, Cuff Size: Normal)   Pulse 63   Ht 6\' 2"  (1.88 m)   Wt 204 lb 8 oz (92.8 kg)   BMI 26.26 kg/m  BMI: Body mass index is 26.26 kg/m.  Physical Exam  Constitutional: He is oriented to person, place, and time. He appears well-developed and well-nourished.  HENT:  Head: Normocephalic and atraumatic.  Eyes: Right eye exhibits no discharge. Left eye exhibits no discharge.  Neck: Normal range of motion. No JVD present.  Cardiovascular: Normal rate, regular rhythm, S1 normal, S2 normal and normal heart sounds.  Exam reveals no distant heart sounds, no friction rub, no midsystolic click and no opening snap.   No murmur heard. Right radial cath site healing well without bleeding, bruising, swelling, erythema, or TTP. Radial pulse 2+.  Pulmonary/Chest: Effort normal and breath sounds normal. No respiratory distress. He has no decreased breath sounds. He has no wheezes. He has no rales. He exhibits no tenderness.  Abdominal: Soft. He exhibits no  distension. There is no tenderness.  Musculoskeletal: He exhibits no edema.  Neurological: He is alert and oriented to person, place, and time.  Skin: Skin is warm and dry. No cyanosis. Nails show no clubbing.  Psychiatric: He has a normal mood and affect. His speech is normal and behavior is normal. Judgment and thought content normal.    EKG:  Was ordered and interpreted by me today. Shows NSR, 63 bpm, left axis deviation, poor R wave progression, no acute st/t changes   Recent Labs: 01/01/2017: ALT 24; Magnesium 2.1 01/02/2017: BUN 13; Creatinine, Ser 0.85; Hemoglobin 13.7; Platelets 197; Potassium 4.2; Sodium 139  01/01/2017: Cholesterol 143; HDL 64; LDL Cholesterol 72; Total CHOL/HDL Ratio 2.2; Triglycerides 37; VLDL 7   Estimated Creatinine Clearance: 107.5 mL/min (by C-G formula based on SCr of 0.85 mg/dL).   Wt Readings from Last 3 Encounters:  01/15/17 204 lb 8 oz (92.8 kg)  01/02/17 205 lb 14.4 oz (93.4 kg)  01/01/17 230 lb (104.3 kg)     Other studies reviewed: Additional studies/records reviewed today include: summarized above  ASSESSMENT AND PLAN:  1. CAD as above: No symptoms concerning for ischemia. Has not missed any doses of DAPT. Has noted some SOB, particularly with his evening dose of Brilinta. This SOB is improving. Suggest he take his PM dose of Brilinta with a little caffeine and see how his breathing does. If he continues to note SOB at follow up in four weeks, or sooner, consider changing to Effient. Would want to discuss with interventional cardiology. Discussed the importance of not missing any doses of DAPT and to call the office if he ever needs assistance in his DAPT. He understands not to miss any doses of DAPT.   2. Essential hypertension: Well Controlled. Continue Coreg and lisinopril.   3. HLD: Lipitor every other day as below. LDL nearly at goal while inpatient at 72. Needs follow up lipid and LFT in 6-8 weeks.   4. Diarrhea: Could possibly be due to one  of several things including medications including Coreg or Lipitor. Symptoms are improving, less likely C diff. Will have patient start taking Lipitor every other day to assess for changes in his diarrhea. If diarrhea persists into this week would consider PCP evaluation for possible C diff vs stopping of Lipitor/Coreg.   5. Polysubstance abuse: Smoking cessation advised. He reports he is down to two cigarettes daily. Not yet ready to stop completely.   6. Abnormal CTA chest: Advised patient he will need follow up imaging in ~ 6 months with PCP.   Disposition: F/u with me in 4 weeks.   Current medicines are reviewed at length with the patient today.  The patient did not have any concerns regarding medicines.  Elinor DodgeSigned,   PA-C 01/15/2017 1:32 PM     [facility] [place] [address] [place], [place] [address] [phone]

## 2017-01-15 NOTE — Patient Instructions (Signed)
Medication Instructions:  Your physician recommends that you continue on your current medications as directed. Please refer to the Current Medication list given to you today.   Labwork: Your physician recommends that you return for lab work in: TODAY (BMP, Mg).   Testing/Procedures: none  Follow-Up: Your physician recommends that you schedule a follow-up appointment in: 1 MONTH WITH DR ARIDA OR RYAN.   If you need a refill on your cardiac medications before your next appointment, please call your pharmacy.

## 2017-01-16 LAB — BASIC METABOLIC PANEL
BUN/Creatinine Ratio: 17 (ref 10–24)
BUN: 14 mg/dL (ref 8–27)
CALCIUM: 9.5 mg/dL (ref 8.6–10.2)
CO2: 21 mmol/L (ref 20–29)
CREATININE: 0.83 mg/dL (ref 0.76–1.27)
Chloride: 103 mmol/L (ref 96–106)
GFR, EST AFRICAN AMERICAN: 111 mL/min/{1.73_m2} (ref >59)
GFR, EST NON AFRICAN AMERICAN: 96 mL/min/{1.73_m2} (ref >59)
Glucose: 79 mg/dL (ref 65–99)
Potassium: 4.7 mmol/L (ref 3.5–5.2)
Sodium: 141 mmol/L (ref 134–144)

## 2017-01-16 LAB — MAGNESIUM: Magnesium: 2.1 mg/dL (ref 1.6–2.3)

## 2017-02-27 ENCOUNTER — Ambulatory Visit (INDEPENDENT_AMBULATORY_CARE_PROVIDER_SITE_OTHER): Payer: Medicaid Other | Admitting: Physician Assistant

## 2017-02-27 ENCOUNTER — Encounter: Payer: Self-pay | Admitting: Physician Assistant

## 2017-02-27 VITALS — BP 130/92 | HR 67 | Ht 74.0 in | Wt 207.5 lb

## 2017-02-27 DIAGNOSIS — I2510 Atherosclerotic heart disease of native coronary artery without angina pectoris: Secondary | ICD-10-CM

## 2017-02-27 DIAGNOSIS — I214 Non-ST elevation (NSTEMI) myocardial infarction: Secondary | ICD-10-CM

## 2017-02-27 DIAGNOSIS — E782 Mixed hyperlipidemia: Secondary | ICD-10-CM

## 2017-02-27 DIAGNOSIS — I10 Essential (primary) hypertension: Secondary | ICD-10-CM | POA: Diagnosis not present

## 2017-02-27 DIAGNOSIS — M25531 Pain in right wrist: Secondary | ICD-10-CM

## 2017-02-27 DIAGNOSIS — Z720 Tobacco use: Secondary | ICD-10-CM

## 2017-02-27 MED ORDER — LISINOPRIL 20 MG PO TABS: 20.0000 mg | ORAL_TABLET | Freq: Every day | ORAL | 3 refills | Status: DC

## 2017-02-27 MED ORDER — ALBUTEROL SULFATE HFA 108 (90 BASE) MCG/ACT IN AERS: 2.0000 | INHALATION_SPRAY | Freq: Four times a day (QID) | RESPIRATORY_TRACT | 0 refills | Status: DC | PRN

## 2017-02-27 MED ORDER — PRASUGREL HCL 10 MG PO TABS: 10.0000 mg | ORAL_TABLET | Freq: Every day | ORAL | 3 refills | Status: DC

## 2017-02-27 NOTE — Patient Instructions (Signed)
Medication Instructions:  Your physician has recommended you make the following change in your medication:  1. STOP Brilinta 2. START Effient 10 mg once daily for 1 year 3. INCREASE Linisopril to 20 mg once daily 4. Refill sent in for inhaler.   Follow-Up: Your physician recommends that you schedule a follow-up appointment in: 3 months with Dr. Kirke CorinArida or Eula Listenyan Dunn PA  It was a pleasure seeing you today here in the office. Please do not hesitate to give us a call back if you have any further questions. [phone]  [place] CellarPamela A. RN, BSN

## 2017-02-27 NOTE — Progress Notes (Signed)
Cardiology Office Note Date:  02/27/2017  Patient ID:  Bryan Barth., DOB [birth year], MRN [id number] PCP:  Patient, No Pcp Per  Cardiologist:  Dr. Kirke Corin, MD    Chief Complaint: Follow up CAD  History of Present Illness: Bryan Weider. is a 61 y.o. male with history of CAD s/p recent NSTEMI in 12/2016 s/p PCI to the LAD as below, hypertension, polysubstance abuse with tobacco, alcohol, marijuana, and remote cocaine abuse who presents for follow-up of CAD s/p NSTEMI in early June 2018.    Prior to his admission in early June, he did not have any previously known cardiac history. 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 Patient was admitted to [facility] on 6/4 after developing severe substernal chest pain radiating down both arms with associated nausea, dyspnea, and paresthesias of the fingers while using a push mower on his own yard. He initially presented to [facility], followed by transport via EMS to [facility] ED. Upon his arrival to [facility] ED he continued to note chest pain. EKG with borderline ST elevation and frequent PVCs. He was started on heparin drip and pain was treated with nitroglycerin and morphine. Troponin peaked at 5.49. Chest x-ray showed aortic atherosclerosis with left carotid artery calcification. No edema or consolidation noted. CTA chest showed no thoracic aortic aneurysm or dissection. No pulmonary embolus. Scattered foci of atherosclerotic calcification in the aorta as well as foci of CAD were noted. A 5 mm nodular opacity in the right lower lobe medial segment was noted. He underwent cardiac catheterization that showed severe 1 vessel CAD with 90% thrombotic stenosis in the proximal LAD, which was felt to be the culprit for the patient's NSTEMI. He underwent successful PCI/DES with a Resolute Onyx drug-eluting stent (3.0 x 18 mm). There was 0% residual stenosis. LVEF  estimated at 55-65% by visual estimate. LVEDP was mildly elevated. Echocardiogram post intervention showed EF 60-65%, normal wall motion, grade 1 diastolic dysfunction, RV cavity size was normal with normal wall thickness and normal RV systolic function. There were no significant valvular abnormalities. Post intervention he did have rare episode of NSVT at 11 beats. He was asymptomatic. He was discharged on dual antiplatelet therapy with ASA 81 mg daily, Brilinta 90 mg twice a day, along with Lipitor 80 mg daily, carvedilol 6.25 mg twice a day, and lisinopril 10 mg daily. In follow-up, he was doign well. He did still note some SOB, though this was improved.   He comes in doing well today. No further chest pain. He continues to note SOB that occurs both at rest and with exertion. SOB is unchanged from his last visit. Taking Brilinta with caffeine did not seem to help with his SOB. He has not missed any doses of DAPT. Back to work full-time now. Notes some discomfort in the right radial wrist with rotational movement or when pushing up with his right hand from a sitting position. BP at home has been running in the 130s systolic. His diarrhea stopped when he discontinued Lipitor.    Past Medical History:  Diagnosis Date  . Alcohol abuse    a. up to a 12 pack of beer/day.  . Arthritis   . CAD in native artery    a. LHC 6/18: severe 1 vessel CAD with 90% thrombotic stenosis in the proximal LAD which was felt to be the culprit for the patient's non-STEMI. He underwent successful  PCI/DES with a resolute onyx drug-eluting stent (3.0 x 18 mm). There was 0% residual stenosis. LVEF estimated at 55-65% by visual estimate. LVEDP was mildly elevated  . H/O echocardiogram    a. echo 6/18: EF 60-65%, normal wall motion, grade 1 diastolic dysfunction, RV cavity size was normal with normal wall thickness and normal RV systolic function. There were no significant valvular abnormalities  . Hypertension   . Marijuana  abuse    a. occasional.  . Tobacco abuse     Past Surgical History:  Procedure Laterality Date  . APPENDECTOMY    . CORONARY STENT INTERVENTION N/A 01/01/2017   Procedure: Coronary Stent Intervention;  Surgeon: Iran OuchArida, Muhammad A, MD;  Location: [facility] INVASIVE CV LAB;  Service: Cardiovascular;  Laterality: N/A;  . KNEE SURGERY    . LEFT HEART CATH AND CORONARY ANGIOGRAPHY N/A 01/01/2017   Procedure: Left Heart Cath and Coronary Angiography;  Surgeon: Iran OuchArida, Muhammad A, MD;  Location: [facility] INVASIVE CV LAB;  Service: Cardiovascular;  Laterality: N/A;    Current Meds  Medication Sig  . albuterol (PROVENTIL HFA;VENTOLIN HFA) 108 (90 Base) MCG/ACT inhaler Inhale 2 puffs into the lungs every 4 (four) hours as needed for wheezing or shortness of breath.  Marland Kitchen. aspirin EC 81 MG tablet Take 1 tablet (81 mg total) by mouth daily.  . carvedilol (COREG) 6.25 MG tablet Take 1 tablet (6.25 mg total) by mouth 2 (two) times daily with a meal.  . lisinopril (PRINIVIL,ZESTRIL) 10 MG tablet Take 1 tablet (10 mg total) by mouth daily.  . Melatonin 10 MG CAPS Take 1 capsule by mouth at bedtime.  . ticagrelor (BRILINTA) 90 MG TABS tablet Take 1 tablet (90 mg total) by mouth 2 (two) times daily.    Allergies:   Patient has no known allergies.   Social History:  The patient  reports that he has been smoking Cigarettes.  He has been smoking about 0.25 packs per day. He has never used smokeless tobacco. He reports that he drinks alcohol. He reports that he uses drugs, including Marijuana.   Family History:  The patient's family history includes CAD in his father and paternal grandfather.  ROS:   Review of Systems  Constitutional: Negative for chills, diaphoresis, fever, malaise/fatigue and weight loss.  HENT: Negative for congestion.   Eyes: Negative for discharge and redness.  Respiratory: Positive for shortness of breath. Negative for cough, hemoptysis, sputum production and wheezing.   Cardiovascular: Negative  for chest pain, palpitations, orthopnea, claudication, leg swelling and PND.  Gastrointestinal: Negative for abdominal pain, blood in stool, heartburn, melena, nausea and vomiting.  Genitourinary: Negative for hematuria.  Musculoskeletal: Positive for joint pain. Negative for falls and myalgias.  Skin: Negative for rash.  Neurological: Negative for dizziness, tingling, tremors, sensory change, speech change, focal weakness, loss of consciousness and weakness.  Endo/Heme/Allergies: Does not bruise/bleed easily.  Psychiatric/Behavioral: Negative for substance abuse. The patient is not nervous/anxious.   All other systems reviewed and are negative.    PHYSICAL EXAM:  VS:  BP (!) 130/92 (BP Location: Left Arm, Patient Position: Sitting, Cuff Size: Normal)   Pulse 67   Ht 6\' 2"  (1.88 m)   Wt 207 lb 8 oz (94.1 kg)   BMI 26.64 kg/m  BMI: Body mass index is 26.64 kg/m.  Physical Exam  Constitutional: He is oriented to person, place, and time. He appears well-developed and well-nourished.  HENT:  Head: Normocephalic and atraumatic.  Eyes: Right eye exhibits no discharge. Left eye exhibits  no discharge.  Neck: Normal range of motion. No JVD present.  Cardiovascular: Normal rate, regular rhythm, S1 normal, S2 normal and normal heart sounds.  Exam reveals no distant heart sounds, no friction rub, no midsystolic click and no opening snap.   No murmur heard. Pulmonary/Chest: Effort normal and breath sounds normal. No respiratory distress. He has no decreased breath sounds. He has no wheezes. He has no rales. He exhibits no tenderness.  Abdominal: Soft. He exhibits no distension. There is no tenderness.  Musculoskeletal: He exhibits no edema.  Right radial with well healed cath site without bruising, swelling, erythema, or TTP. Radial pulse 2+. Full ROM.   Neurological: He is alert and oriented to person, place, and time.  Skin: Skin is warm and dry. No cyanosis. Nails show no clubbing.    Psychiatric: He has a normal mood and affect. His speech is normal and behavior is normal. Judgment and thought content normal.     EKG:  Was ordered and interpreted by me today. Shows NSR, 67 bpm, left axis deviation, prior anterior infarct  Recent Labs: 01/01/2017: ALT 24 01/02/2017: Hemoglobin 13.7; Platelets 197 01/15/2017: BUN 14; Creatinine, Ser 0.83; Magnesium 2.1; Potassium 4.7; Sodium 141  01/01/2017: Cholesterol 143; HDL 64; LDL Cholesterol 72; Total CHOL/HDL Ratio 2.2; Triglycerides 37; VLDL 7   CrCl cannot be calculated (Patient's most recent lab result is older than the maximum 21 days allowed.).   Wt Readings from Last 3 Encounters:  02/27/17 207 lb 8 oz (94.1 kg)  01/15/17 204 lb 8 oz (92.8 kg)  01/02/17 205 lb 14.4 oz (93.4 kg)     Other studies reviewed: Additional studies/records reviewed today include: summarized above  ASSESSMENT AND PLAN:  1. CAD in native coronary artery without angina: No chest pain. Continues to note SOB both at rest and with exertion. Unchanged from last visit. He tried taking Brilinta with caffeine without much help. He would like to stop Brilinta at this time. Discussed with interventional cardiology. Ok to change from ReunionBrilinta to Effient 10 mg daily. Continue ASA 81 mg daily. The importance of DAPT was again discussed with the patient. No plans for further ischemic workup at this time.   2. Essential HTN: BP has been consistently running in the 130s systolic range. Increase lisinopril to 20 mg daily. Continue Coreg 6.25 mg bid.   3. HLD: He reports Lipitor led to mental confusion and diarrhea. He refuses retrial of any statin at this time. Consider Zetia, refuses at this time. May benefit from referral to Lipid Clinic for possible PCSK9 inhibitor.   4. Polysubstance abuse: Continues to smoke. Cessation is advised.   5. Abnormal CTA chest: Follow up with PCP for repeat imaging in late [id number].   6. Right wrist pain: Patient notes discomfort with  rotation or pushing up with his right wrist. He has been doing a fair amount of yard work recently. He has not noticed any swelling, bruising, bleeding, or erythema. He has no pain, swelling, bruising, erythema, or TTP on exam. Radial pulse is 2+. Advised patient to back off on some of the yard work and wear a wrist splint.   Disposition: F/u with Dr. Kirke CorinArida, MD in 3 months.    Current medicines are reviewed at length with the patient today.  The patient did not have any concerns regarding medicines.  Elinor DodgeSigned,   PA-C 02/27/2017 2:44 PM     [facility] [address] [place], [place] [address] [phone]

## 2017-04-05 ENCOUNTER — Other Ambulatory Visit: Payer: Self-pay | Admitting: Physician Assistant

## 2017-04-06 ENCOUNTER — Other Ambulatory Visit: Payer: Self-pay

## 2017-04-06 ENCOUNTER — Telehealth: Payer: Self-pay | Admitting: Cardiovascular Disease

## 2017-04-06 NOTE — Telephone Encounter (Signed)
S/w pt who understands 81mg  aspirin can be purchased OTC without a prescription.  He did not realize lisinopril 20mg  was sent to his pharmacy on July 31 He has been taking (2) 10mg  and is running out. He continued Brilinta until two days ago when he ran out, at which time he started effient. We reviewed current medications; pt verbalized understanding.

## 2017-04-06 NOTE — Telephone Encounter (Signed)
Pt needs us to call [facility] in [place]   And please update them with the medication changes we did last time he was here  Lisinopril was 10 mg now we changed it to 20 mg  Please advise

## 2017-04-06 NOTE — Telephone Encounter (Signed)
Please advise if okay to refill the Aspirin 81 mg; Dr. Elpidio AnisSudini prescribed?  [facility]

## 2017-04-06 NOTE — Telephone Encounter (Signed)
[facility] the Lisinopril was increased from 10 mg to 20 mg.  The pharmacist is also requesting a refill on his Aspirin 81 mg daily.

## 2017-06-01 ENCOUNTER — Encounter: Payer: Self-pay | Admitting: Cardiovascular Disease

## 2017-06-01 ENCOUNTER — Ambulatory Visit (INDEPENDENT_AMBULATORY_CARE_PROVIDER_SITE_OTHER): Payer: Medicaid Other | Admitting: Cardiovascular Disease

## 2017-06-01 VITALS — BP 158/100 | HR 63 | Ht 74.0 in | Wt 206.5 lb

## 2017-06-01 DIAGNOSIS — E7800 Pure hypercholesterolemia, unspecified: Secondary | ICD-10-CM | POA: Diagnosis not present

## 2017-06-01 DIAGNOSIS — Z720 Tobacco use: Secondary | ICD-10-CM

## 2017-06-01 DIAGNOSIS — I2510 Atherosclerotic heart disease of native coronary artery without angina pectoris: Secondary | ICD-10-CM | POA: Diagnosis not present

## 2017-06-01 DIAGNOSIS — I10 Essential (primary) hypertension: Secondary | ICD-10-CM

## 2017-06-01 MED ORDER — CLOPIDOGREL BISULFATE 75 MG PO TABS: 75.0000 mg | ORAL_TABLET | Freq: Every day | ORAL | 3 refills | Status: DC

## 2017-06-01 MED ORDER — ROSUVASTATIN CALCIUM 10 MG PO TABS: 10.0000 mg | ORAL_TABLET | Freq: Every day | ORAL | 3 refills | Status: DC

## 2017-06-01 MED ORDER — LISINOPRIL 40 MG PO TABS: 40.0000 mg | ORAL_TABLET | Freq: Every day | ORAL | 3 refills | Status: DC

## 2017-06-01 NOTE — Patient Instructions (Addendum)
Medication Instructions:  Your physician has recommended you make the following change in your medication:  START taking plavix 75mg  once daily START taking rosuvastatin 10mg  once daily INCREASE lisinopril to 40mg  once daily   Labwork: Fasting lipid and liver profile in 6 weeks at the [facility] lab. No appt needed. Nothing to eat or drink after midnight the evening before your labs.   Testing/Procedures: none  Follow-Up: Your physician recommends that you schedule a follow-up appointment in: 3 months with Dr. Kirke Corin.   Any Other Special Instructions Will Be Listed Below (If Applicable).     If you need a refill on your cardiac medications before your next appointment, please call your pharmacy.  Clopidogrel tablets What is this medicine? CLOPIDOGREL (kloh PID oh grel) helps to prevent blood clots. This medicine is used to prevent heart attack, stroke, or other vascular events in people who are at high risk. This medicine may be used for other purposes; ask your health care provider or pharmacist if you have questions. COMMON BRAND NAME(S): Plavix What should I tell my health care provider before I take this medicine? They need to know if you have any of the following conditions: -bleeding disorders -bleeding in the brain -having surgery -history of stomach bleeding -an unusual or allergic reaction to clopidogrel, other medicines, foods, dyes, or preservatives -pregnant or trying to get pregnant -breast-feeding How should I use this medicine? Take this medicine by mouth with a glass of water. Follow the directions on the prescription label. You may take this medicine with or without food. If it upsets your stomach, take it with food. Take your medicine at regular intervals. Do not take it more often than directed. Do not stop taking except on your doctor's advice. A special MedGuide will be given to you by the pharmacist with each prescription and refill. Be sure to read this  information carefully each time. Talk to your pediatrician regarding the use of this medicine in children. Special care may be needed. Overdosage: If you think you have taken too much of this medicine contact a poison control center or emergency room at once. NOTE: This medicine is only for you. Do not share this medicine with others. What if I miss a dose? If you miss a dose, take it as soon as you can. If it is almost time for your next dose, take only that dose. Do not take double or extra doses. What may interact with this medicine? Do not take this medicine with the following medications: -dasabuvir; ombitasvir; paritaprevir; ritonavir -defibrotide This medicine may also interact with the following medications: -antiviral medicines for HIV or AIDS -aspirin -certain medicines for depression like citalopram, fluoxetine, fluvoxamine -certain medicines for fungal infections like ketoconazole, fluconazole, voriconazole -certain medicines for seizures like felbamate, oxcarbazepine, phenytoin -certain medicines for stomach problems like cimetidine, omeprazole, esomeprazole -certain medicines that treat or prevent blood clots like warfarin, enoxaparin, dalteparin, apixaban, dabigatran, rivaroxaban, ticlopidine -chloramphenicol -cilostazol -fluvastatin -isoniazid -modafinil -nicardipine -NSAIDS, medicines for pain and inflammation, like ibuprofen or naproxen -quinine -repaglinide -tamoxifen -tolbutamide -topiramate -torsemide This list may not describe all possible interactions. Give your health care provider a list of all the medicines, herbs, non-prescription drugs, or dietary supplements you use. Also tell them if you smoke, drink alcohol, or use illegal drugs. Some items may interact with your medicine. What should I watch for while using this medicine? Visit your doctor or health care professional for regular check ups. Do not stop taking your medicine unless your doctor  tells you  to. Notify your doctor or health care professional and seek emergency treatment if you develop breathing problems; changes in vision; chest pain; severe, sudden headache; pain, swelling, warmth in the leg; trouble speaking; sudden numbness or weakness of the face, arm or leg. These can be signs that your condition has gotten worse. If you are going to have surgery or dental work, tell your doctor or health care professional that you are taking this medicine. Certain genetic factors may reduce the effect of this medicine. Your doctor may use genetic tests to determine treatment. What side effects may I notice from receiving this medicine? Side effects that you should report to your doctor or health care professional as soon as possible: -allergic reactions like skin rash, itching or hives, swelling of the face, lips, or tongue -signs and symptoms of bleeding such as bloody or black, tarry stools; red or dark-brown urine; spitting up blood or brown material that looks like coffee grounds; red spots on the skin; unusual bruising or bleeding from the eye, gums, or nose -signs and symptoms of a blood clot such as breathing problems; changes in vision; chest pain; severe, sudden headache; pain, swelling, warmth in the leg; trouble speaking; sudden numbness or weakness of the face, arm or leg Side effects that usually do not require medical attention (report to your doctor or health care professional if they continue or are bothersome): -constipation -diarrhea -headache -upset stomach This list may not describe all possible side effects. Call your doctor for medical advice about side effects. You may report side effects to FDA at 1-800-FDA-1088. Where should I keep my medicine? Keep out of the reach of children. Store at room temperature of 59 to 86 degrees F (15 to 30 degrees C). Throw away any unused medicine after the expiration date. NOTE: This sheet is a summary. It may not cover all possible  information. If you have questions about this medicine, talk to your doctor, pharmacist, or health care provider.  2018 Elsevier/Gold Standard (2015-04-22 10:00:44) Rosuvastatin Tablets What is this medicine? ROSUVASTATIN (roe SOO va sta tin) is known as a HMG-CoA reductase inhibitor or 'statin'. It lowers cholesterol and triglycerides in the blood. This drug may also reduce the risk of heart attack, stroke, or other health problems in patients with risk factors for heart disease. Diet and lifestyle changes are often used with this drug. This medicine may be used for other purposes; ask your health care provider or pharmacist if you have questions. COMMON BRAND NAME(S): Crestor What should I tell my health care provider before I take this medicine? They need to know if you have any of these conditions: -frequently drink alcoholic beverages -kidney disease -liver disease -muscle aches or weakness -other medical condition -an unusual or allergic reaction to rosuvastatin, other medicines, foods, dyes, or preservatives -pregnant or trying to get pregnant -breast-feeding How should I use this medicine? Take this medicine by mouth with a glass of water. Follow the directions on the prescription label. Do not cut, crush or chew this medicine. You can take this medicine with or without food. Take your doses at regular intervals. Do not take your medicine more often than directed. Talk to your pediatrician regarding the use of this medicine in children. While this drug may be prescribed for children as young as 60 years old for selected conditions, precautions do apply. Overdosage: If you think you have taken too much of this medicine contact a poison control center or emergency room at once.  NOTE: This medicine is only for you. Do not share this medicine with others. What if I miss a dose? If you miss a dose, take it as soon as you can. Do not take 2 doses within 12 hours of each other. If there are  less than 12 hours until your next dose, take only that dose. Do not take double or extra doses. What may interact with this medicine? Do not take this medicine with any of the following medications: -herbal medicines like red yeast rice This medicine may also interact with the following medications: -alcohol -antacids containing aluminum hydroxide or magnesium hydroxide -cyclosporine -other medicines for high cholesterol -some medicines for HIV infection -warfarin This list may not describe all possible interactions. Give your health care provider a list of all the medicines, herbs, non-prescription drugs, or dietary supplements you use. Also tell them if you smoke, drink alcohol, or use illegal drugs. Some items may interact with your medicine. What should I watch for while using this medicine? Visit your doctor or health care professional for regular check-ups. You may need regular tests to make sure your liver is working properly. Tell your doctor or health care professional right away if you get any unexplained muscle pain, tenderness, or weakness, especially if you also have a fever and tiredness. Your doctor or health care professional may tell you to stop taking this medicine if you develop muscle problems. If your muscle problems do not go away after stopping this medicine, contact your health care professional. This medicine may affect blood sugar levels. If you have diabetes, check with your doctor or health care professional before you change your diet or the dose of your diabetic medicine. Avoid taking antacids containing aluminum, calcium or magnesium within 2 hours of taking this medicine. This drug is only part of a total heart-health program. Your doctor or a dietician can suggest a low-cholesterol and low-fat diet to help. Avoid alcohol and smoking, and keep a proper exercise schedule. Do not use this drug if you are pregnant or breast-feeding. Serious side effects to an unborn  child or to an infant are possible. Talk to your doctor or pharmacist for more information. What side effects may I notice from receiving this medicine? Side effects that you should report to your doctor or health care professional as soon as possible: -allergic reactions like skin rash, itching or hives, swelling of the face, lips, or tongue -dark urine -fever -joint pain -muscle cramps, pain -redness, blistering, peeling or loosening of the skin, including inside the mouth -trouble passing urine or change in the amount of urine -unusually weak or tired -yellowing of the eyes or skin Side effects that usually do not require medical attention (report to your doctor or health care professional if they continue or are bothersome): -constipation -heartburn -nausea -stomach gas, pain, upset This list may not describe all possible side effects. Call your doctor for medical advice about side effects. You may report side effects to FDA at 1-800-FDA-1088. Where should I keep my medicine? Keep out of the reach of children. Store at room temperature between 20 and 25 degrees C (68 and 77 degrees F). Keep container tightly closed (protect from moisture). Throw away any unused medicine after the expiration date. NOTE: This sheet is a summary. It may not cover all possible information. If you have questions about this medicine, talk to your doctor, pharmacist, or health care provider.  2018 Elsevier/Gold Standard (2014-12-31 13:33:08)

## 2017-06-01 NOTE — Progress Notes (Signed)
Cardiology Office Note   Date:  06/01/2017   ID:  Bryan Catalina., DOB 14-Sep-1955, MRN [id number]  PCP:  Patient, No Pcp Per  Cardiologist:   Bryan Bears, MD   Chief Complaint  Patient presents with  . other    3 month f/u c/o sob. Meds reviewed verbally with pt.      History of Present Illness: Bryan Kittell. is a 61 y.o. male who presents for a follow-up visit regarding coronary artery disease.  He had non-ST elevation myocardial infarction in June 2018.  He underwent PCI and drug-eluting stent placement to the LAD without complications.  Ejection fraction was normal.  Other medical problems include hypertension, polysubstance abuse including tobacco, alcohol and marijuana and remote history of cocaine use. He did not tolerate high-dose atorvastatin due to myalgia.  During last visit, he was switched from Brilinta to Effient due to shortness of breath but it appears that he has not been taking Effient.  We verified that by calling his pharmacy. He denies any chest pain or significant dyspnea. He continues to smoke about 4 cigarettes a day.  He continues to drink about 6 beers a day.    Past Medical History:  Diagnosis Date  . Alcohol abuse    a. up to a 12 pack of beer/day.  . Arthritis   . CAD in native artery    a. LHC 6/18: severe 1 vessel CAD with 90% thrombotic stenosis in the proximal LAD which was felt to be the culprit for the patient's non-STEMI. He underwent successful PCI/DES with a resolute onyx drug-eluting stent (3.0 x 18 mm). There was 0% residual stenosis. LVEF estimated at 55-65% by visual estimate. LVEDP was mildly elevated  . H/O echocardiogram    a. echo 6/18: EF 60-65%, normal wall motion, grade 1 diastolic dysfunction, RV cavity size was normal with normal wall thickness and normal RV systolic function. There were no significant valvular abnormalities  . Hypertension   . Marijuana abuse    a. occasional.  . Tobacco abuse     Past Surgical  History:  Procedure Laterality Date  . APPENDECTOMY    . CORONARY STENT INTERVENTION N/A 01/01/2017   Procedure: Coronary Stent Intervention;  Surgeon: Iran Ouch, MD;  Location: [facility] INVASIVE CV LAB;  Service: Cardiovascular;  Laterality: N/A;  . KNEE SURGERY    . LEFT HEART CATH AND CORONARY ANGIOGRAPHY N/A 01/01/2017   Procedure: Left Heart Cath and Coronary Angiography;  Surgeon: Iran Ouch, MD;  Location: [facility] INVASIVE CV LAB;  Service: Cardiovascular;  Laterality: N/A;     Current Outpatient Prescriptions  Medication Sig Dispense Refill  . albuterol (PROVENTIL HFA;VENTOLIN HFA) 108 (90 Base) MCG/ACT inhaler Inhale 2 puffs into the lungs every 6 (six) hours as needed for wheezing or shortness of breath. 1 Inhaler 0  . aspirin EC 81 MG tablet Take 1 tablet (81 mg total) by mouth daily. 30 tablet 0  . carvedilol (COREG) 6.25 MG tablet Take 1 tablet (6.25 mg total) by mouth 2 (two) times daily with a meal. 180 tablet 3  . lisinopril (PRINIVIL,ZESTRIL) 40 MG tablet Take 1 tablet (40 mg total) by mouth daily. 90 tablet 3  . Melatonin 10 MG CAPS Take 1 capsule by mouth at bedtime.    . clopidogrel (PLAVIX) 75 MG tablet Take 1 tablet (75 mg total) by mouth daily. 90 tablet 3  . rosuvastatin (CRESTOR) 10 MG tablet Take 1 tablet (10 mg total) by  mouth daily. 90 tablet 3   No current facility-administered medications for this visit.     Allergies:   Patient has no known allergies.    Social History:  The patient  reports that he has been smoking Cigarettes.  He has been smoking about 0.25 packs per day. He has never used smokeless tobacco. He reports that he drinks alcohol. He reports that he uses drugs, including Marijuana.   Family History:  The patient's family history includes CAD in his father and paternal grandfather.    ROS:  Please see the history of present illness.   Otherwise, review of systems are positive for none.   All other systems are reviewed and negative.     PHYSICAL EXAM: VS:  BP (!) 158/100 (BP Location: Left Arm, Patient Position: Sitting, Cuff Size: Normal)   Pulse 63   Ht 6\' 2"  (1.88 m)   Wt 206 lb 8 oz (93.7 kg)   BMI 26.51 kg/m  , BMI Body mass index is 26.51 kg/m. GEN: Well nourished, well developed, in no acute distress  HEENT: normal  Neck: no JVD, carotid bruits, or masses Cardiac: RRR; no murmurs, rubs, or gallops,no edema  Respiratory:  clear to auscultation bilaterally, normal work of breathing GI: soft, nontender, nondistended, + BS MS: no deformity or atrophy  Skin: warm and dry, no rash Neuro:  Strength and sensation are intact Psych: euthymic mood, full affect   EKG:  EKG is not ordered today.   Recent Labs: 01/01/2017: ALT 24 01/02/2017: Hemoglobin 13.7; Platelets 197 01/15/2017: BUN 14; Creatinine, Ser 0.83; Magnesium 2.1; Potassium 4.7; Sodium 141    Lipid Panel    Component Value Date/Time   CHOL 143 01/01/2017 1310   TRIG 37 01/01/2017 1310   HDL 64 01/01/2017 1310   CHOLHDL 2.2 01/01/2017 1310   VLDL 7 01/01/2017 1310   LDLCALC 72 01/01/2017 1310      Wt Readings from Last 3 Encounters:  06/01/17 206 lb 8 oz (93.7 kg)  02/27/17 207 lb 8 oz (94.1 kg)  01/15/17 204 lb 8 oz (92.8 kg)     No flowsheet data found.    ASSESSMENT AND PLAN:  1.  Coronary artery disease involving native coronary arteries without angina: I discussed with him the importance of dual antiplatelet therapy and my concerns about the possibility of late stent thrombosis without dual antiplatelet therapy.  I elected to start him on Plavix 75 mg once daily in addition to aspirin 81 mg once daily.  Plan is to continue dual antiplatelet therapy until June 2019.  2.  Essential hypertension: Blood pressure is elevated.  I increased lisinopril to 40 mg daily.  I discussed with him the importance of low-sodium diet.  3.  Hyperlipidemia: He did not tolerate high-dose atorvastatin.  Thus, I elected to start him on rosuvastatin 10 mg  daily.  Check lipid and liver profile in 6 weeks.  4.  Tobacco use: I discussed with him the importance of smoking cessation.  I also discussed with him the importance of cutting down on alcohol use.  5.  Pulmonary nodule noted in CT scan in June 2018 with recommended follow-up in June 2019.  Disposition:   FU with me in 3 months  Signed,  Bryan BearsMuhammad Arida, MD  06/01/2017 4:09 PM    [facility]

## 2017-06-05 ENCOUNTER — Telehealth: Payer: Self-pay | Admitting: Cardiovascular Disease

## 2017-06-05 NOTE — Telephone Encounter (Signed)
Pt calling on his medication list is missing  prasugrel 10 mg 1 a day   He is calling stating Dr Kirke CorinArida placed him on two new medications  Clopidogrel and Rosuvastatin   Would like to make sure he is not doubling up on medications   Please call back

## 2017-06-05 NOTE — Telephone Encounter (Signed)
S/w pt.   Brilinta switched to Effient 10mg  at 7/31 OV d/t c/o SOB. Per phone conversation on 9/7, pt stated he continued to take Brilinta until 9/5 at which time he started Effient. At 11/2 OV, it was noted " During last visit, he was switched from Brilinta to Effient due to shortness of breath but it appears that he has not been taking Effient.  We verified that by calling his pharmacy" Plavix 75mg  was added along with 81mg  aspirin.  Pt calls today stating he has been taking prasugrel along with plavix for four days as he did not realize that prasugrel and effient are the same medication.   Reviewed with Ward Givenshris Berge, NP, for advice regarding which is preferred. Per Thayer Ohmhris, pt may finish Effient that he has at home then switch to plavix 75mg .  I have left a message for pt to contact the office.

## 2017-06-05 NOTE — Telephone Encounter (Signed)
Patient returning re: medication please call

## 2017-06-05 NOTE — Telephone Encounter (Signed)
Pt understands instructions and repeated back to me. He will continue Effient without taking plavix until he finishes what he has at home. He will then stop Effient and start Plavix 75mg  once daily. He will not take any more Brilinta.  Reviewed medication list. Pt confirms he is taking all other medications as prescribed.  S/w [place] at [facility] who closed all future refills for brilinta. No current prescription on file for effient.

## 2017-07-26 ENCOUNTER — Telehealth: Payer: Self-pay | Admitting: Cardiovascular Disease

## 2017-07-26 NOTE — Telephone Encounter (Signed)
Lipid and liver profile in 6 weeks ordered at 06/01/17. Reminder call today. Pt verbalized understanding and will try and have them this week.

## 2017-07-27 ENCOUNTER — Ambulatory Visit
Admission: EM | Admit: 2017-07-27 | Discharge: 2017-07-27 | Disposition: A | Discharge status: Home / Self Care | Payer: Medicaid Other | Attending: Family Medicine | Admitting: Family Medicine

## 2017-07-27 ENCOUNTER — Other Ambulatory Visit: Payer: Self-pay

## 2017-07-27 ENCOUNTER — Encounter: Payer: Self-pay | Admitting: *Deleted

## 2017-07-27 DIAGNOSIS — R05 Cough: Secondary | ICD-10-CM

## 2017-07-27 DIAGNOSIS — J40 Bronchitis, not specified as acute or chronic: Secondary | ICD-10-CM | POA: Diagnosis not present

## 2017-07-27 DIAGNOSIS — R059 Cough, unspecified: Secondary | ICD-10-CM

## 2017-07-27 MED ORDER — DOXYCYCLINE HYCLATE 100 MG PO TABS: 100.0000 mg | ORAL_TABLET | Freq: Two times a day (BID) | ORAL | 0 refills | Status: DC

## 2017-07-27 MED ORDER — ALBUTEROL SULFATE HFA 108 (90 BASE) MCG/ACT IN AERS: 1.0000 | INHALATION_SPRAY | Freq: Four times a day (QID) | RESPIRATORY_TRACT | 0 refills | Status: DC | PRN

## 2017-07-27 NOTE — ED Triage Notes (Signed)
Patient started having chest congestion cough and headache 5 days ago.

## 2017-07-27 NOTE — ED Provider Notes (Signed)
[facility]    CSN: [id number] Arrival date & time: 07/27/17  1507     History   Chief Complaint Chief Complaint  Patient presents with  . Cough  . Headache    HPI Bryan Hofferber. is a 61 y.o. male.   The history is provided by the patient.  URI  Presenting symptoms: congestion, cough and fatigue   Severity:  Moderate Onset quality:  Sudden Duration:  7 days Timing:  Constant Progression:  Worsening Chronicity:  New Relieved by:  Nothing Ineffective treatments:  OTC medications Associated symptoms: wheezing   Risk factors: chronic cardiac disease and sick contacts  Chronic respiratory disease: unknown; chronic smoker.     Past Medical History:  Diagnosis Date  . Alcohol abuse    a. up to a 12 pack of beer/day.  . Arthritis   . CAD in native artery    a. LHC 6/18: severe 1 vessel CAD with 90% thrombotic stenosis in the proximal LAD which was felt to be the culprit for the patient's non-STEMI. He underwent successful PCI/DES with a resolute onyx drug-eluting stent (3.0 x 18 mm). There was 0% residual stenosis. LVEF estimated at 55-65% by visual estimate. LVEDP was mildly elevated  . H/O echocardiogram    a. echo 6/18: EF 60-65%, normal wall motion, grade 1 diastolic dysfunction, RV cavity size was normal with normal wall thickness and normal RV systolic function. There were no significant valvular abnormalities  . Hypertension   . Marijuana abuse    a. occasional.  . Tobacco abuse     Patient Active Problem List   Diagnosis Date Noted  . NSTEMI (non-ST elevated myocardial infarction) (HCC) 01/01/2017    Past Surgical History:  Procedure Laterality Date  . APPENDECTOMY    . CORONARY STENT INTERVENTION N/A 01/01/2017   Procedure: Coronary Stent Intervention;  Surgeon: Iran Ouch, MD;  Location: [facility] INVASIVE CV LAB;  Service: Cardiovascular;  Laterality: N/A;  . KNEE SURGERY    . LEFT HEART CATH AND CORONARY ANGIOGRAPHY N/A 01/01/2017   Procedure: Left Heart Cath and Coronary Angiography;  Surgeon: Iran Ouch, MD;  Location: [facility] INVASIVE CV LAB;  Service: Cardiovascular;  Laterality: N/A;       Home Medications    Prior to Admission medications   Medication Sig Start Date End Date Taking? Authorizing Provider  aspirin EC 81 MG tablet Take 1 tablet (81 mg total) by mouth daily. 01/03/17  Yes Milagros Loll, MD  carvedilol (COREG) 6.25 MG tablet Take 1 tablet (6.25 mg total) by mouth 2 (two) times daily with a meal. 01/15/17  Yes Dunn, Raymon Mutton, PA-C  clopidogrel (PLAVIX) 75 MG tablet Take 1 tablet (75 mg total) by mouth daily. 06/01/17  Yes Iran Ouch, MD  lisinopril (PRINIVIL,ZESTRIL) 40 MG tablet Take 1 tablet (40 mg total) by mouth daily. 06/01/17 08/30/17 Yes Iran Ouch, MD  Melatonin 10 MG CAPS Take 1 capsule by mouth at bedtime.   Yes [provider]  rosuvastatin (CRESTOR) 10 MG tablet Take 1 tablet (10 mg total) by mouth daily. 06/01/17 08/30/17 Yes Iran Ouch, MD  albuterol (PROVENTIL HFA;VENTOLIN HFA) 108 (90 Base) MCG/ACT inhaler Inhale 1-2 puffs into the lungs every 6 (six) hours as needed for wheezing or shortness of breath. 07/27/17   Payton Mccallum, MD  doxycycline (VIBRA-TABS) 100 MG tablet Take 1 tablet (100 mg total) by mouth 2 (two) times daily. 07/27/17   Payton Mccallum, MD    Family  History Family History  Problem Relation Age of Onset  . CAD Father        s/p CABG in his 4040's or 4450's.  Marland Kitchen. CAD Paternal Grandfather     Social History Social History   Tobacco Use  . Smoking status: Current Every Day Smoker    Packs/day: 0.25    Types: Cigarettes  . Smokeless tobacco: Never Used  . Tobacco comment: smokes 2 cigarettes daily.   Substance Use Topics  . Alcohol use: Yes    Comment: 12 pack of beer most days of the week.  . Drug use: Yes    Types: Marijuana    Comment: occasional MJ.  Remote cocaine.     Allergies   Patient has no known allergies.   Review of  Systems Review of Systems  Constitutional: Positive for fatigue.  HENT: Positive for congestion.   Respiratory: Positive for cough and wheezing.      Physical Exam Triage Vital Signs ED Triage Vitals  Enc Vitals Group     BP 07/27/17 1528 (!) 159/94     Pulse Rate 07/27/17 1528 65     Resp 07/27/17 1528 16     Temp 07/27/17 1528 98.2 F (36.8 C)     Temp Source 07/27/17 1528 Oral     SpO2 07/27/17 1528 100 %     Weight 07/27/17 1529 200 lb (90.7 kg)     Height 07/27/17 1529 6\' 2"  (1.88 m)     Head Circumference --      Peak Flow --      Pain Score 07/27/17 1530 9     Pain Loc --      Pain Edu? --      Excl. in GC? --    No data found.  Updated Vital Signs BP (!) 159/94 (BP Location: Left Arm)   Pulse 65   Temp 98.2 F (36.8 C) (Oral)   Resp 16   Ht 6\' 2"  (1.88 m)   Wt 200 lb (90.7 kg)   SpO2 100%   BMI 25.68 kg/m   Visual Acuity Right Eye Distance:   Left Eye Distance:   Bilateral Distance:    Right Eye Near:   Left Eye Near:    Bilateral Near:     Physical Exam  Constitutional: He appears well-developed and well-nourished. No distress.  HENT:  Head: Normocephalic and atraumatic.  Right Ear: Tympanic membrane, external ear and ear canal normal.  Left Ear: Tympanic membrane, external ear and ear canal normal.  Nose: Nose normal.  Mouth/Throat: Uvula is midline, oropharynx is clear and moist and mucous membranes are normal. No oropharyngeal exudate or tonsillar abscesses.  Eyes: Conjunctivae and EOM are normal. Pupils are equal, round, and reactive to light. Right eye exhibits no discharge. Left eye exhibits no discharge. No scleral icterus.  Neck: Normal range of motion. Neck supple. No tracheal deviation present. No thyromegaly present.  Cardiovascular: Normal rate, regular rhythm and normal heart sounds.  Pulmonary/Chest: Effort normal. No stridor. No respiratory distress. He has wheezes (few expiratory and diffuse rhonchi). He has no rales. He exhibits  no tenderness.  Lymphadenopathy:    He has no cervical adenopathy.  Neurological: He is alert.  Skin: Skin is warm and dry. No rash noted. He is not diaphoretic.  Nursing note and vitals reviewed.    UC Treatments / Results  Labs (all labs ordered are listed, but only abnormal results are displayed) Labs Reviewed - No data to display  EKG  EKG Interpretation None       Radiology No results found.  Procedures Procedures (including critical care time)  Medications Ordered in UC Medications - No data to display   Initial Impression / Assessment and Plan / UC Course  I have reviewed the triage vital signs and the nursing notes.  Pertinent labs & imaging results that were available during my care of the patient were reviewed by me and considered in my medical decision making (see chart for details).      Final Clinical Impressions(s) / UC Diagnoses   Final diagnoses:  Cough  Bronchitis    ED Discharge Orders        Ordered    doxycycline (VIBRA-TABS) 100 MG tablet  2 times daily     07/27/17 1638    albuterol (PROVENTIL HFA;VENTOLIN HFA) 108 (90 Base) MCG/ACT inhaler  Every 6 hours PRN     07/27/17 1639     1. diagnosis reviewed with patient 2. rx as per orders above; reviewed possible side effects, interactions, risks and benefits  3. Follow-up prn if symptoms worsen or don't improve  Controlled Substance Prescriptions [place] Controlled Substance Registry consulted? Not Applicable   Payton Mccallumonty, , MD 07/27/17 [id number]

## 2017-07-31 ENCOUNTER — Telehealth: Payer: Self-pay | Admitting: Emergency Medicine

## 2017-07-31 MED ORDER — AZITHROMYCIN 250 MG PO TABS: ORAL_TABLET | ORAL | 0 refills | Status: AC

## 2017-07-31 NOTE — Telephone Encounter (Signed)
Patient called requesting change in medication. Patietn saw Dr. Judd Gaudieronty on Friday and was antibiotic, patient states the medication is making him nauseated and vomit every time he takes it.  Per Dr. Judd Gaudieronty change patient medication to a Zpac.

## 2017-08-01 IMAGING — CT CT ANGIO CHEST-ABD-PELV FOR DISSECTION W/ AND WO/W CM
2 of 7 series · 11 of 46 positions shown, 12 images · IV contrast (APPLIED)
Comparison: Chest radiograph January 01, 2017

CLINICAL DATA: Chest and abdominal pain

EXAM:
CT ANGIOGRAPHY CHEST, ABDOMEN AND PELVIS
TECHNIQUE: Initially, axial CT images were obtained through the chest without
intravenous contrast material administration. Multidetector CT
imaging through the chest, abdomen and pelvis was performed using
the standard protocol during bolus administration of intravenous
contrast. Multiplanar reconstructed images and MIPs were obtained
and reviewed to evaluate the vascular anatomy.
CONTRAST:  100 mL Isovue 370 nonionic

[Series 6: axial arterial · axial · arterial · 0.78mm/px · z∈[-590,-83]mm · 8 of 215 slices shown, 9 images]
[im 23/215  soft-tissue]
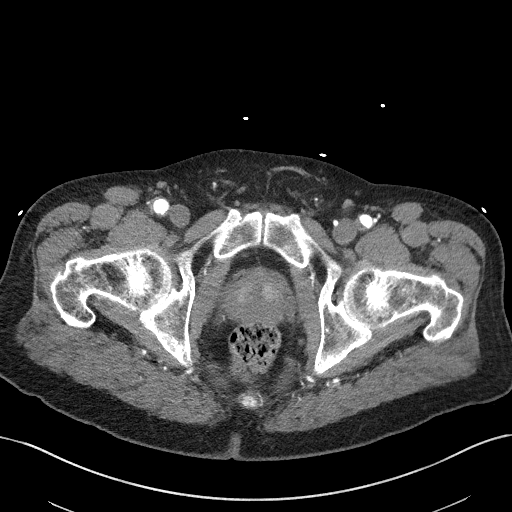
[im 23/215  bone]
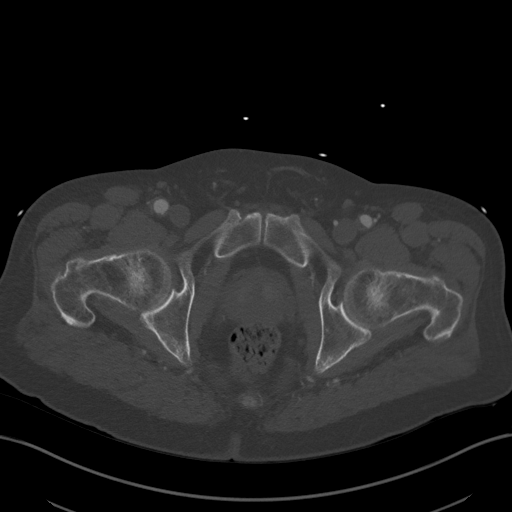
[im 46/215  soft-tissue]
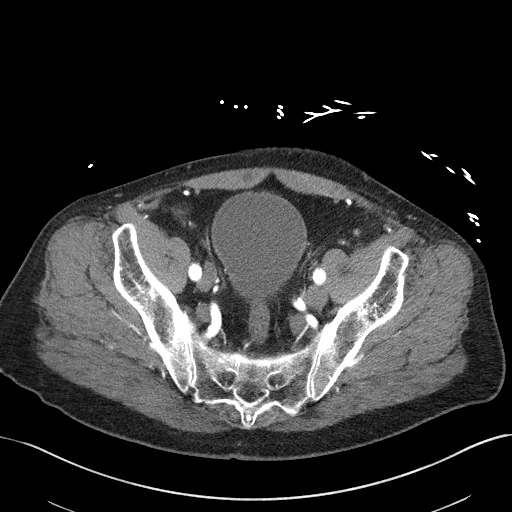
[im 68/215  soft-tissue]
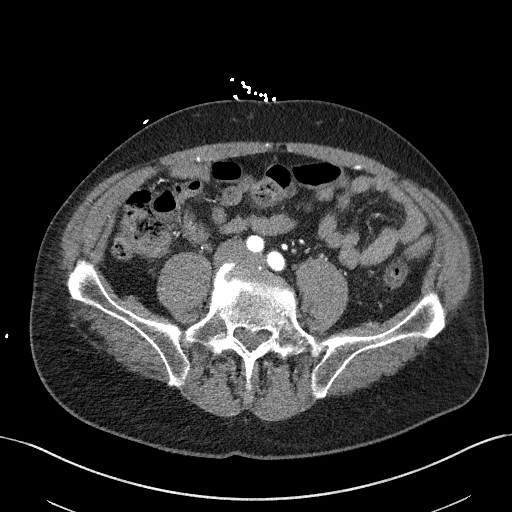
[im 91/215  soft-tissue]
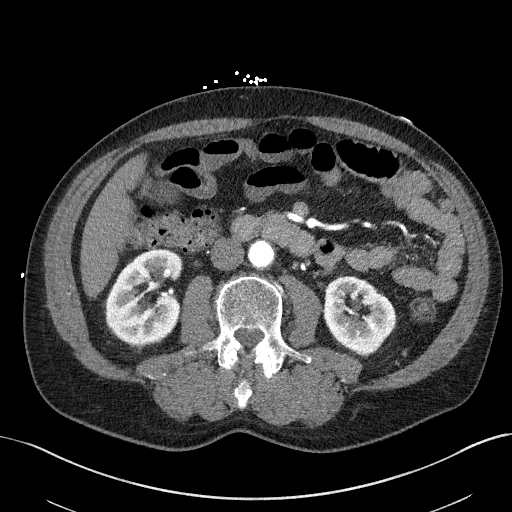
[im 124/215  soft-tissue]
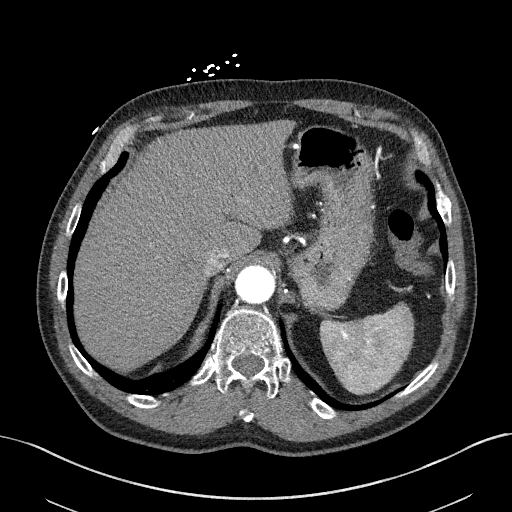
[im 147/215  soft-tissue]
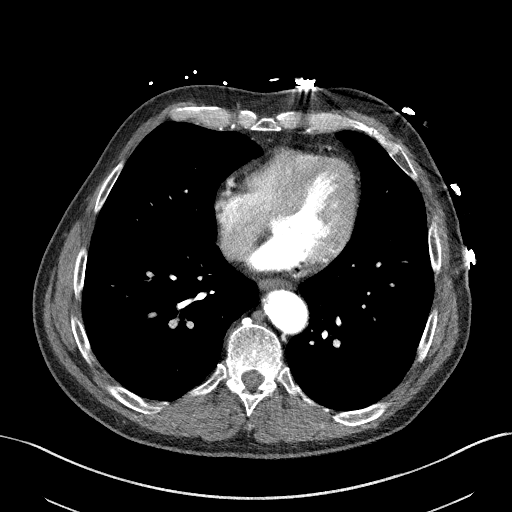
[im 169/215  soft-tissue]
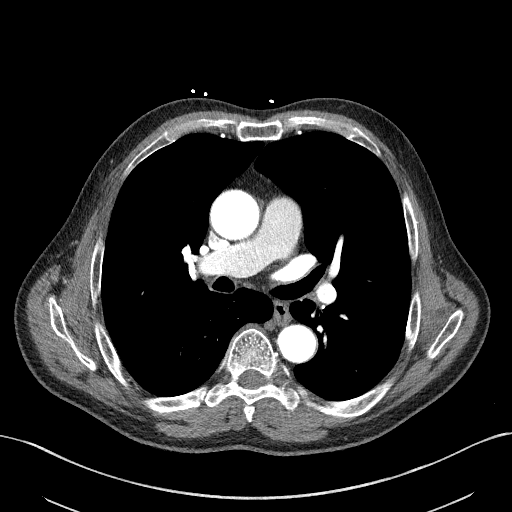
[im 192/215  soft-tissue]
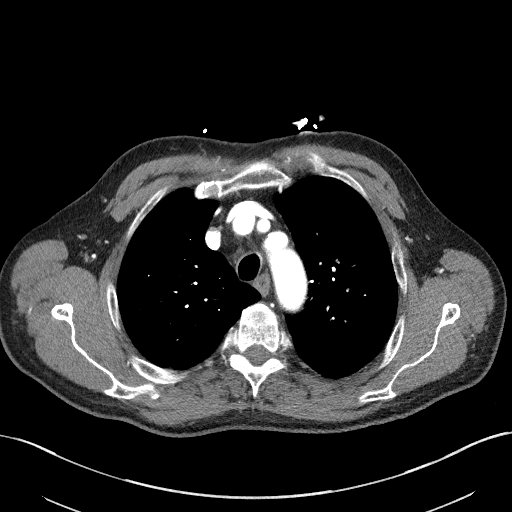

[Series 9: coronals · coronal · 0.75mm/px · 3 of 149 slices shown]
[im 38/149  soft-tissue]
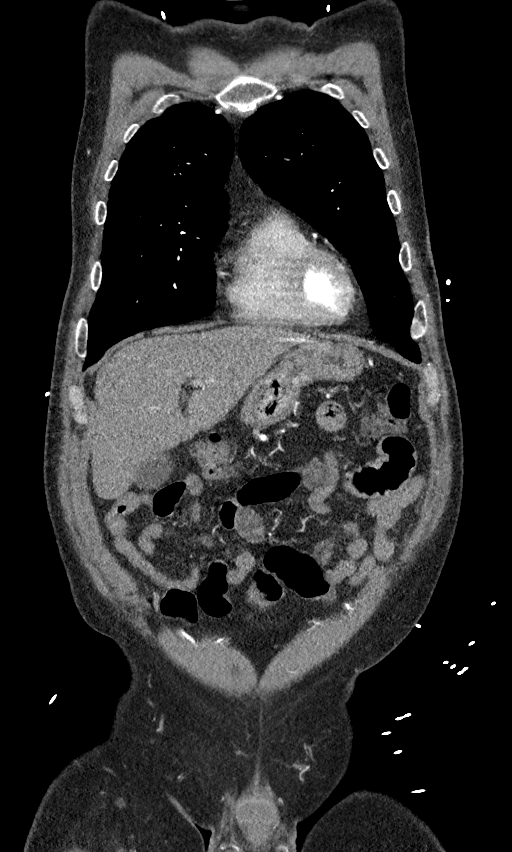
[im 75/149  soft-tissue]
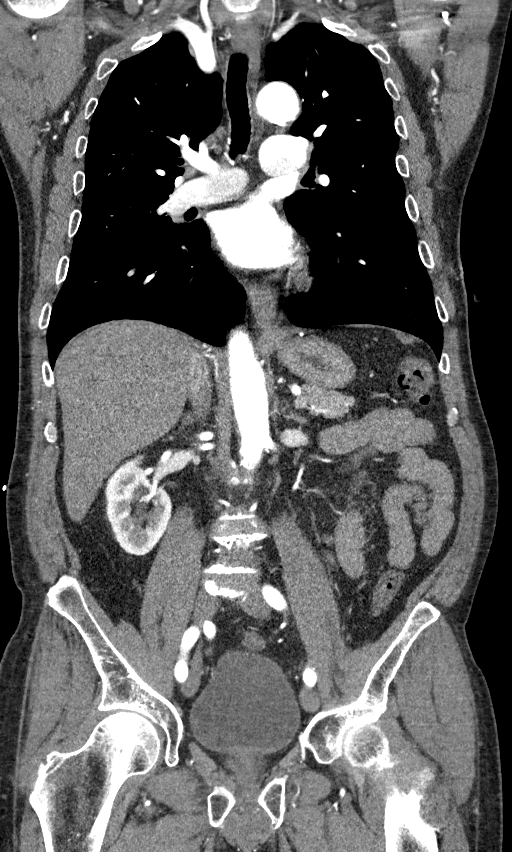
[im 112/149  soft-tissue]
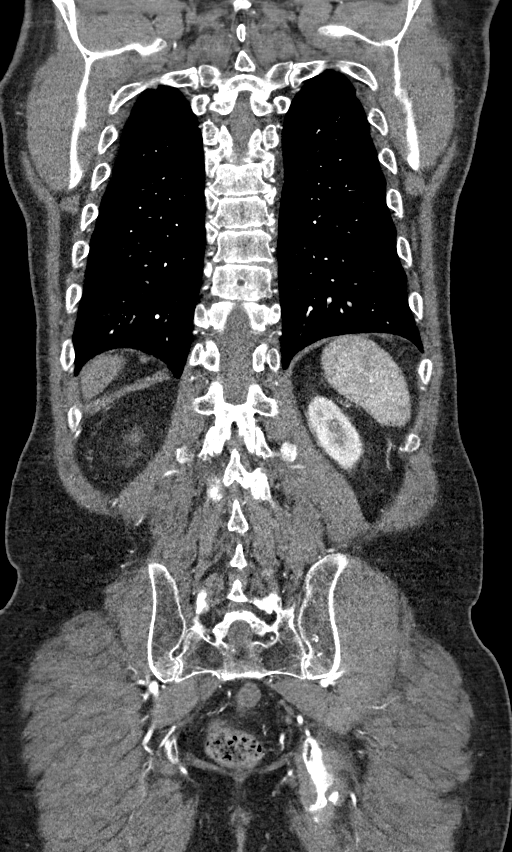

[11 of 46 positions shown; findings below may reference images not displayed]

FINDINGS: CTA CHEST FINDINGS

Cardiovascular: There is no evident intramural hematoma on
noncontrast enhanced study. There is no appreciable thoracic aortic
aneurysm or dissection. The visualized great vessels appear normal
except for slight atherosclerotic calcification in the right common
carotid artery. There is calcification in the aortic arch region.
There are foci of coronary artery calcification. Pericardium is not
appreciably thickened. No pulmonary embolus is demonstrated.

Mediastinum/Nodes: Visualized thyroid appears unremarkable. There is
no appreciable thoracic adenopathy. There is a small hiatal hernia.

Lungs/Pleura: On axial slice 52 series 7, there is a 5 mm nodular
opacity in the medial segment of the right lower lobe. There is no
lung edema or consolidation. No pleural effusion or pleural
thickening. No pneumothorax.

Musculoskeletal: There old healed rib fractures on the left. There
are no blastic or lytic bone lesions.

Review of the MIP images confirms the above findings.

CTA ABDOMEN AND PELVIS FINDINGS

VASCULAR

Aorta: There is no abdominal aortic aneurysm or dissection. There is
atherosclerotic calcification and slight peripheral thrombus in the
distal abdominal aorta.

Celiac: There is no celiac artery aneurysm or dissection. This
vessel and its major branches are patent.

SMA: There is no superior mesenteric artery aneurysm or dissection.
The specimen its major branches appear patent.

Renals: There is a single renal artery on each side. The right main
renal artery bifurcates slightly beyond its origin. There is no
aneurysm or dissection involving the renal artery and its branches.
No fibromuscular dysplasia evident. Renal arteries in their
peripheral branches appear patent.

IMA: Inferior mesenteric artery and its major branches appear
patent. Inferior mesenteric artery and its branches show no aneurysm
or dissection.

Inflow: There is atherosclerotic calcification at the origin of the
left common iliac artery with 40-50% diameter stenosis at the origin
of the left common iliac artery. There is minimal atherosclerotic
calcification in the right common iliac artery. There is mild
calcification at the origin of the left internal iliac artery.
Scattered foci of calcification are noted in each internal iliac
artery. The other major pelvic vessels appear patent. No aneurysm or
dissection evident in the major pelvic arterial vessels. Note that
there is mild calcification in the periphery of the right common
femoral artery with perhaps 15-20% diameter stenosis.

Veins: No obvious venous abnormality within the limitations of this
arterial phase study.

Review of the MIP images confirms the above findings.

NON-VASCULAR

Hepatobiliary: No focal liver lesions are appreciable. Gallbladder
wall is not appreciably thickened. There is no biliary duct
dilatation.

Pancreas: No pancreatic mass or inflammatory focus.

Spleen: No splenic lesions are evident.

Adrenals/Urinary Tract: Right adrenal appears normal. There is mild
generalized left adrenal hypertrophy. Kidneys bilaterally show no
evident mass or hydronephrosis on either side. There is no renal or
ureteral calculus on either side. Urinary bladder is midline with
wall thickness within normal limits.

Stomach/Bowel: There is no bowel wall or mesenteric thickening.
There are scattered sigmoid and distal descending colonic
diverticulum without diverticulitis. There is no bowel obstruction.
No free air or portal venous air.

Lymphatic: There is no evident adenopathy in the abdomen or pelvis.

Reproductive: Prostate and seminal vesicles appear normal in size
and contour. No evident pelvic mass.

Other: Appendix absent. No abscess or ascites evident in the abdomen
or pelvis. There is a minimal ventral hernia containing only fat.

Musculoskeletal: There is mild degenerative change in the lumbar
spine. There is moderate spinal stenosis at L4-5 due to diffuse disc
protrusion and bony hypertrophy. There are no blastic or lytic bone
lesions. No intramuscular or abdominal wall lesion.

Review of the MIP images confirms the above findings.
IMPRESSION: CT angiogram chest: No thoracic aortic aneurysm or dissection. No
pulmonary embolus. Scattered foci of atherosclerotic calcification
in the aorta as well as foci of coronary artery calcification.

No lung edema or consolidation. 5 mm nodular opacity right lower
lobe medial segment. No follow-up needed if patient is low-risk.
Non-contrast chest CT can be considered in 12 months if patient is
high-risk. This recommendation follows the consensus statement:
Guidelines for Management of Incidental Pulmonary Nodules Detected
[DATE].

No adenopathy.

Subacute to chronic appearing rib fractures on the left.

CT angiogram abdomen and CT angiogram pelvis: No aneurysm or
dissection in the aorta or major mesenteric/ pelvic arterial
vessels. Scattered areas of atherosclerosis, most notably in the
distal aorta and origin of the left common iliac artery. No
high-grade obstruction.

Spinal stenosis at L4-5, multifactorial.

Left adrenal hypertrophy.  Significance of this finding uncertain.

Scattered left-sided colonic diverticular without diverticulitis. No
bowel obstruction. No abscess. Appendix absent.

Minimal ventral hernia containing only fat.

## 2017-08-01 IMAGING — CR DG CHEST 2V
2 series · 2 of 2 positions shown · non-contrast
Comparison: November 20, 2016

CLINICAL DATA: Chest pain

EXAM:
CHEST  2 VIEW

[chest pa]
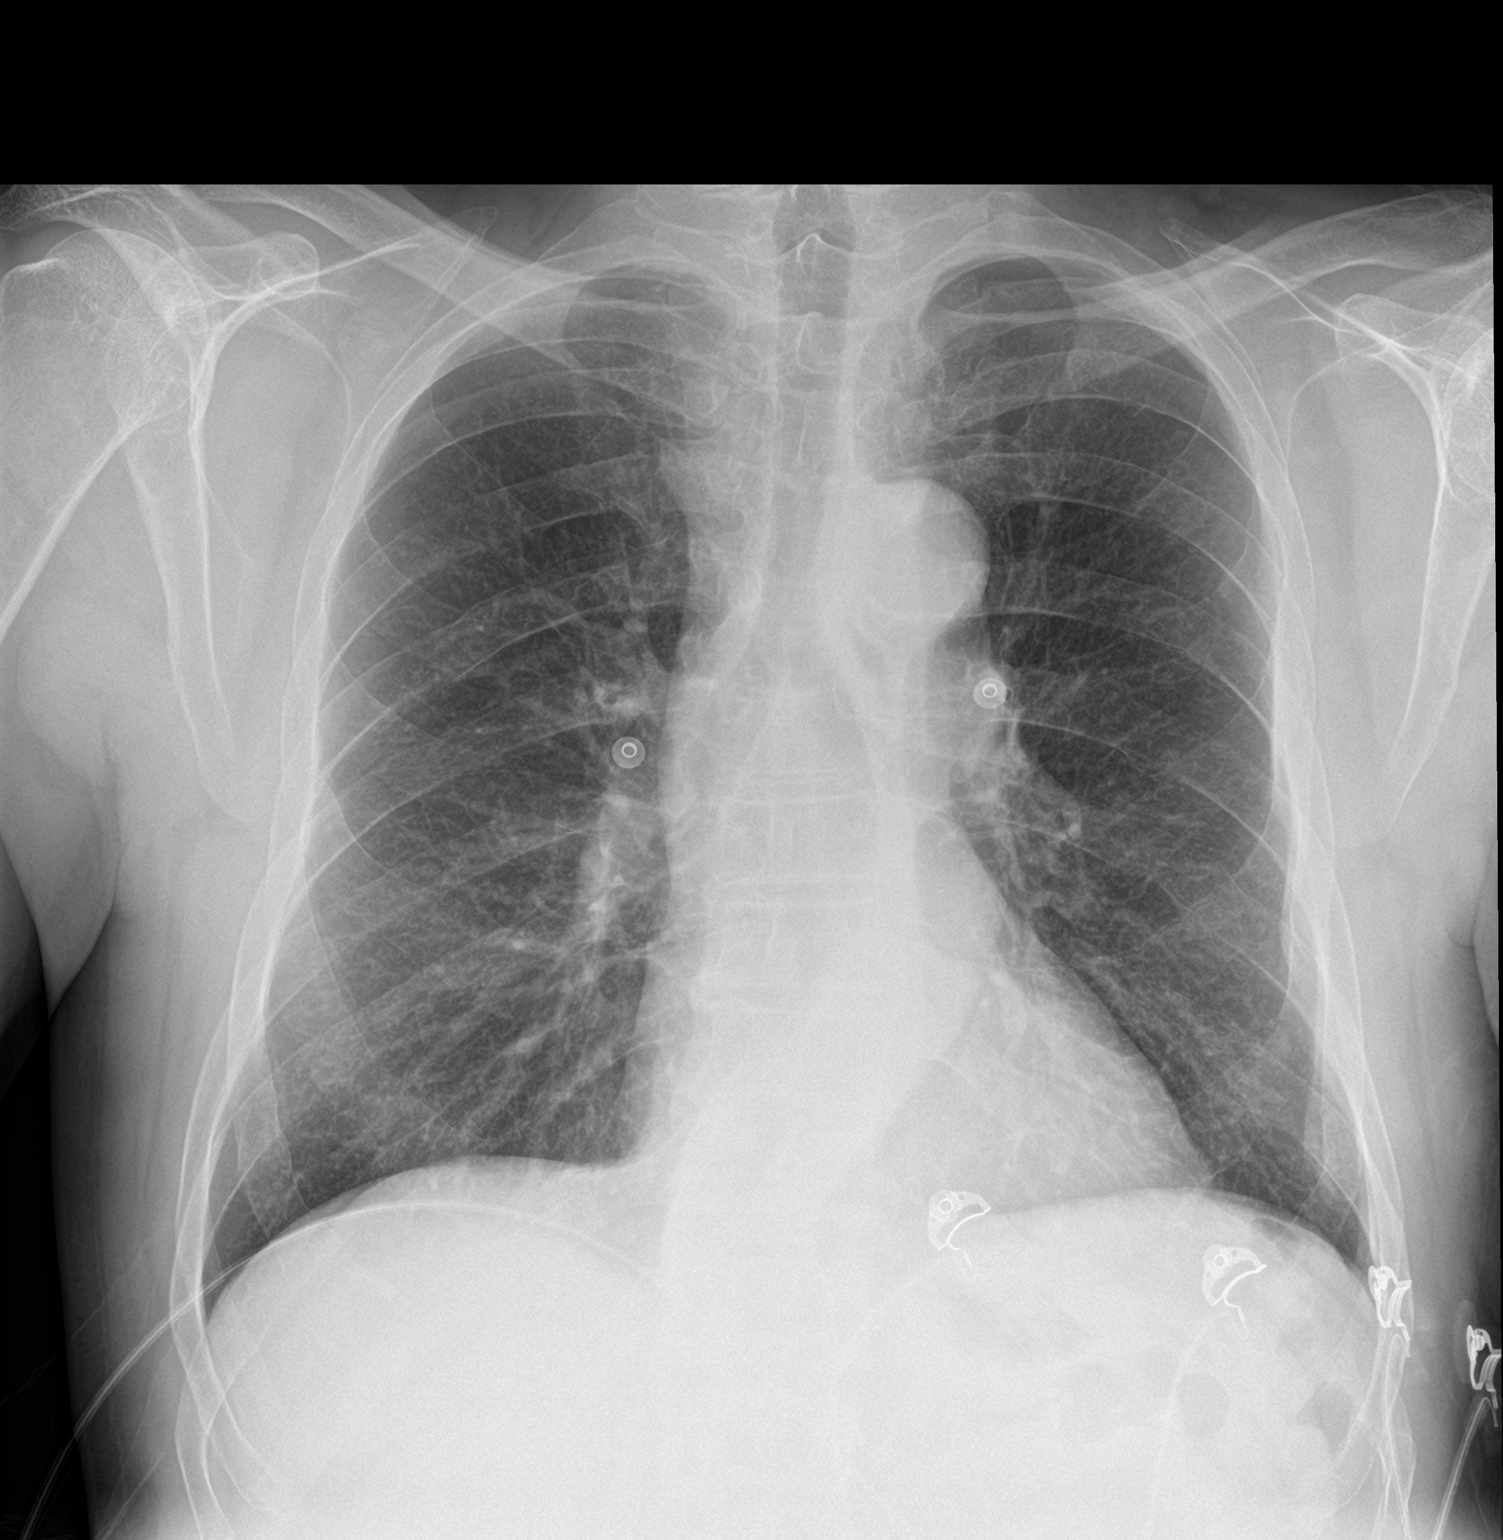

[chest lat]
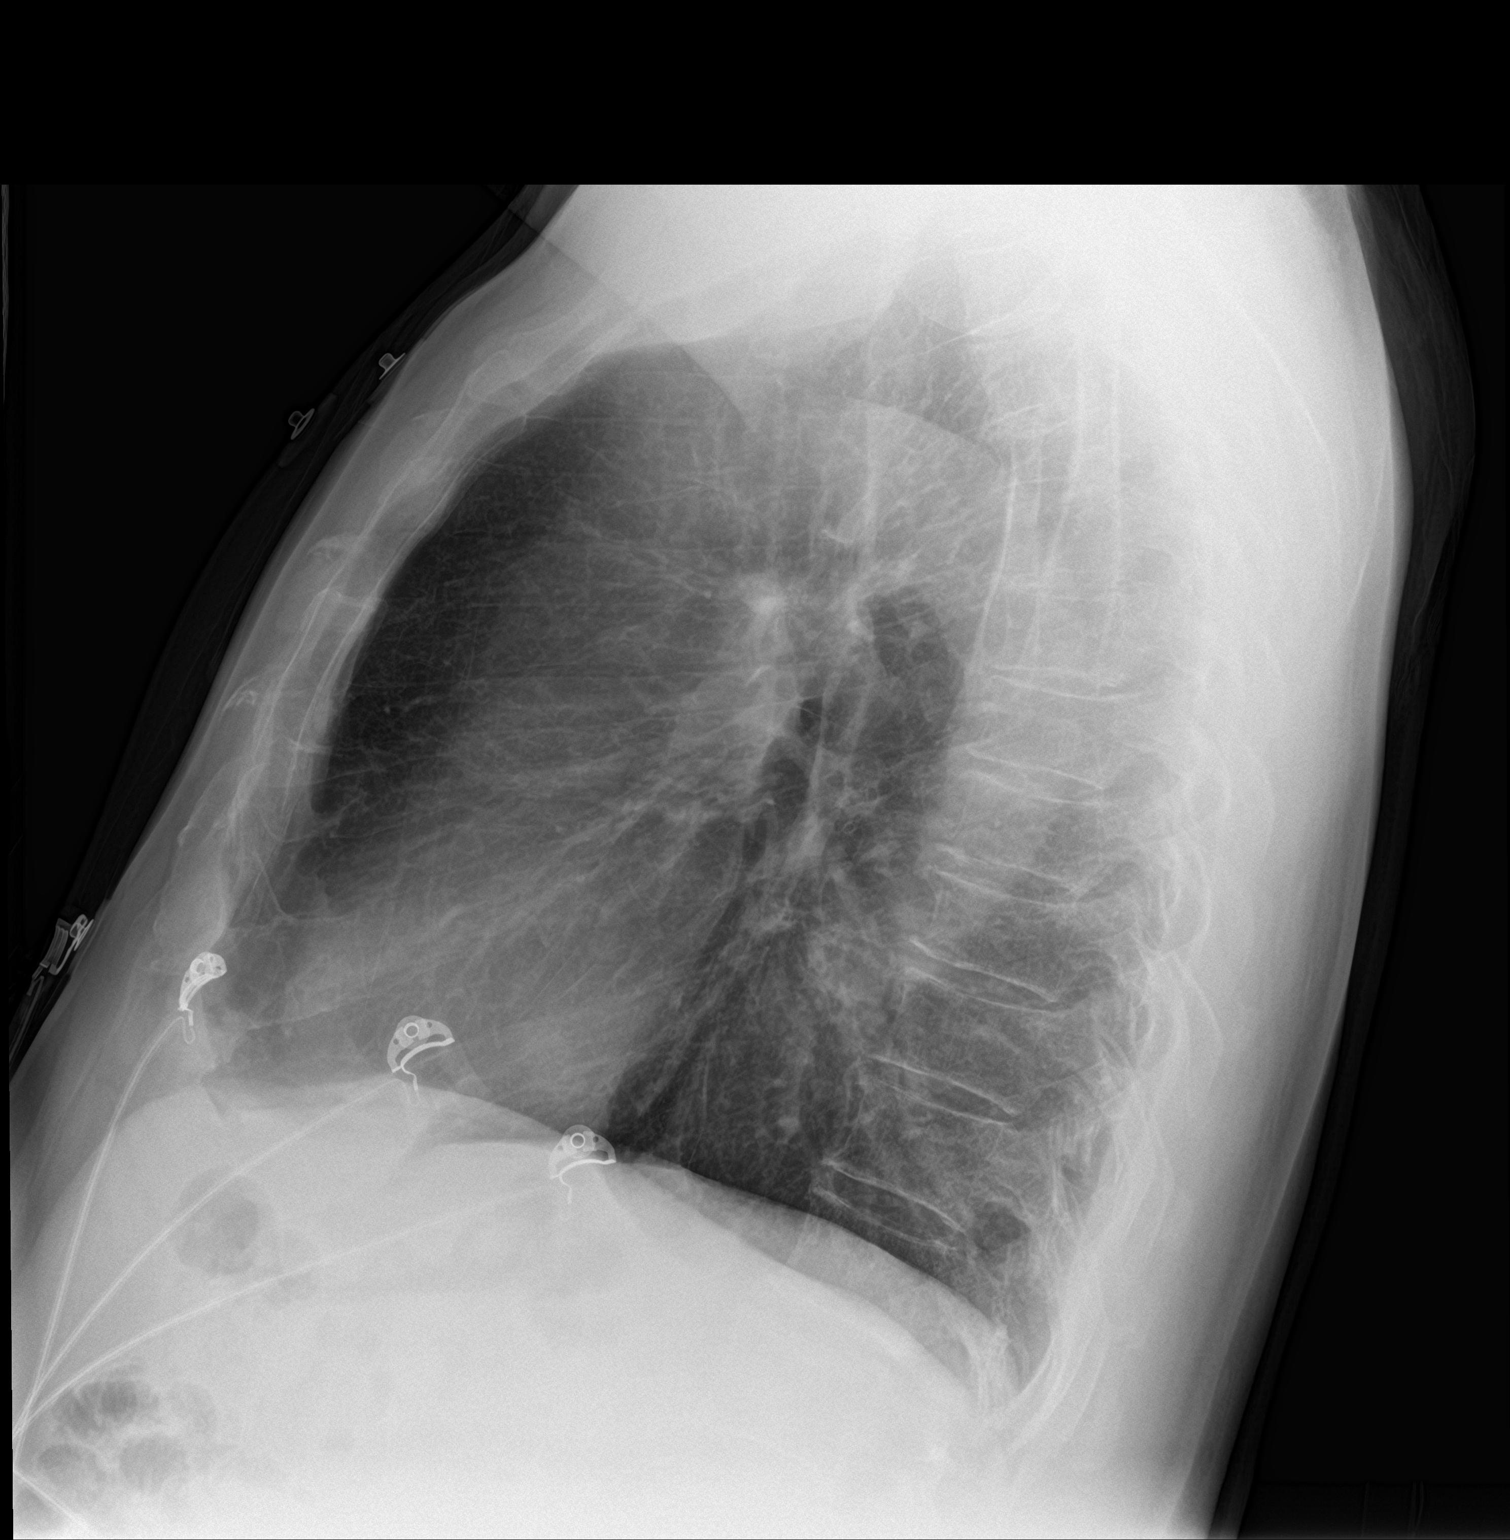

[2 of 2 positions shown; findings below may reference images not displayed]

FINDINGS: There is no edema or consolidation. The heart size and pulmonary
vascularity are normal. No adenopathy. There is aortic
atherosclerosis. There is left carotid artery calcification.
IMPRESSION: Aortic atherosclerosis. Left carotid artery calcification. No edema
or consolidation.

## 2017-09-06 ENCOUNTER — Ambulatory Visit: Payer: Medicaid Other | Admitting: Cardiovascular Disease

## 2017-09-18 ENCOUNTER — Ambulatory Visit: Payer: Medicaid Other | Admitting: Cardiovascular Disease

## 2017-11-02 ENCOUNTER — Encounter: Payer: Self-pay | Admitting: Cardiovascular Disease

## 2017-11-06 ENCOUNTER — Ambulatory Visit: Payer: Medicaid Other | Admitting: Cardiovascular Disease

## 2018-01-24 ENCOUNTER — Ambulatory Visit: Payer: Medicaid Other | Admitting: Cardiovascular Disease

## 2018-01-24 ENCOUNTER — Telehealth: Payer: Self-pay | Admitting: Cardiovascular Disease

## 2018-01-24 MED ORDER — ALBUTEROL SULFATE HFA 108 (90 BASE) MCG/ACT IN AERS: 1.0000 | INHALATION_SPRAY | Freq: Four times a day (QID) | RESPIRATORY_TRACT | 1 refills | Status: DC | PRN

## 2018-01-24 NOTE — Telephone Encounter (Signed)
Please advise on who should refill this medication.  Per patients chart patient does not have a Primary Care Provider.  Thanks!

## 2018-01-24 NOTE — Telephone Encounter (Signed)
Reviewed with Eula Listenyan Dunn, PA. He agreed to send it this time but further refills need to come from primary care provider. Rx sent to pharmacy with that message.

## 2018-01-24 NOTE — Telephone Encounter (Signed)
°*  STAT* If patient is at the pharmacy, call can be transferred to refill team.   1. Which medications need to be refilled? (please list name of each medication and dose if known)    Albuterol 108 mcg/act 1-2 puffs q 6hr prn  2. Which pharmacy/location (including street and city if local pharmacy) is medication to be sent to?     [facility] [place] [address]  3. Do they need a 30 day or 90 day supply? 90

## 2018-01-29 ENCOUNTER — Other Ambulatory Visit: Payer: Self-pay | Admitting: Physician Assistant

## 2018-01-30 DIAGNOSIS — S51812A Laceration without foreign body of left forearm, initial encounter: Secondary | ICD-10-CM | POA: Diagnosis not present

## 2018-01-30 DIAGNOSIS — G8911 Acute pain due to trauma: Secondary | ICD-10-CM | POA: Diagnosis not present

## 2018-02-22 ENCOUNTER — Ambulatory Visit: Payer: Medicaid Other | Admitting: Physician Assistant

## 2018-02-22 NOTE — Progress Notes (Deleted)
Cardiology Office Note Date:  02/22/2018  Patient ID:  Bryan Catalinaorman F Casanas Jr., DOB 07-06-1956, MRN [id number] PCP:  Patient, No Pcp Per  Cardiologist:  Dr. Kirke CorinArida, MD  ***refresh   Chief Complaint: Follow up  History of Present Illness: Bryan Ibarra Jr. is a 62 y.o. male with history of CAD with a NSTEMI in 12/2016 s/p PCI to the LAD as below, hypertension, polysubstance abuse with tobacco, alcohol, marijuana, and remote cocaine abuse who presents for follow-up of CAD.  Patient was admitted to [facility] in 6/18 after developing severe substernal chest pain radiating down both arms with associated nausea, dyspnea, and paresthesias of the fingers while using a push mower on his own yard. Upon his arrival to [facility] ED he continued to note chest pain. EKG with borderline ST elevation and frequent PVCs. Troponin peaked at 5.49. CXR showed aortic atherosclerosis with left carotid artery calcification. No edema or consolidation noted. CTA chest showed no thoracic aortic aneurysm or dissection. No pulmonary embolus. Scattered foci of atherosclerotic calcification in the aorta as well as foci of CAD were noted. A 5 mm nodular opacity in the right lower lobe medial segment was noted. He underwent LHC that showed severe 1 vessel CAD with 90% thrombotic stenosis in the proximal LAD, which was felt to be the culprit for the patient's NSTEMI. He underwent successful PCI/DES with a Resolute Onyx drug-eluting stent (3.0 x 18 mm). There was 0% residual stenosis. LVEF estimated at 55-65% by visual estimate. LVEDP was mildly elevated. Echo post intervention showed EF 60-65%, normal wall motion, Gr1DD, RV cavity size was normal with normal wall thickness and normal RVSF. There were no significant valvular abnormalities. At hospital follow up in 01/2017, he was changed from Brilinta to Effient given SOB. However, when he was seen in follow up in 05/2017 he had not been taking Effient and this was verified by calling his pharmacy.  He continued to smoke about 4 cigarettes per day and drink about 6 beers per day. Concerns of late stent thrombosis were discussed with him. He was placed on Plavix 75 mg daily along with ASA 81 mg daily. He was placed on Crestor 10 mg daily as he did not tolerate Lipitor. Patient apparently was taking Effient and Plavix following his appointment in 05/2017 for 4 days. He was advised to finish out Effient pills he had at home then start Plavix 75 mg daily. He never did follow up with his recommended lipid and liver function after starting Crestor. He was seen in the ED in 06/2017 with bronchitis.   ***  Past Medical History:  Diagnosis Date  . Alcohol abuse    a. up to a 12 pack of beer/day.  . Arthritis   . CAD in native artery    a. LHC 6/18: severe 1 vessel CAD with 90% thrombotic stenosis in the proximal LAD which was felt to be the culprit for the patient's non-STEMI. He underwent successful PCI/DES with a resolute onyx drug-eluting stent (3.0 x 18 mm). There was 0% residual stenosis. LVEF estimated at 55-65% by visual estimate. LVEDP was mildly elevated  . H/O echocardiogram    a. echo 6/18: EF 60-65%, normal wall motion, grade 1 diastolic dysfunction, RV cavity size was normal with normal wall thickness and normal RV systolic function. There were no significant valvular abnormalities  . Hypertension   . Marijuana abuse    a. occasional.  . Tobacco abuse     Past Surgical History:  Procedure Laterality Date  .  APPENDECTOMY    . CORONARY STENT INTERVENTION N/A 01/01/2017   Procedure: Coronary Stent Intervention;  Surgeon: Iran Ouch, MD;  Location: [facility] INVASIVE CV LAB;  Service: Cardiovascular;  Laterality: N/A;  . KNEE SURGERY    . LEFT HEART CATH AND CORONARY ANGIOGRAPHY N/A 01/01/2017   Procedure: Left Heart Cath and Coronary Angiography;  Surgeon: Iran Ouch, MD;  Location: [facility] INVASIVE CV LAB;  Service: Cardiovascular;  Laterality: N/A;    No outpatient medications  have been marked as taking for the 02/22/18 encounter (Appointment) with Sondra Barges, PA-C.    Allergies:   Patient has no known allergies.   Social History:  The patient  reports that he has been smoking cigarettes.  He has been smoking about 0.25 packs per day. He has never used smokeless tobacco. He reports that he drinks alcohol. He reports that he has current or past drug history. Drug: Marijuana.   Family History:  The patient's family history includes CAD in his father and paternal grandfather.  ROS:   ROS   PHYSICAL EXAM: *** VS:  There were no vitals taken for this visit. BMI: There is no height or weight on file to calculate BMI.  Physical Exam   EKG:  Was ordered and interpreted by me today. Shows ***  Recent Labs: No results found for requested labs within last 8760 hours.  No results found for requested labs within last 8760 hours.   CrCl cannot be calculated (Patient's most recent lab result is older than the maximum 21 days allowed.).   Wt Readings from Last 3 Encounters:  07/27/17 200 lb (90.7 kg)  06/01/17 206 lb 8 oz (93.7 kg)  02/27/17 207 lb 8 oz (94.1 kg)     Other studies reviewed: Additional studies/records reviewed today include: summarized above  ASSESSMENT AND PLAN:  1. ***  Disposition: F/u with *** in   Current medicines are reviewed at length with the patient today.  The patient did not have any concerns regarding medicines.  Signed, Eula Listen, PA-C 02/22/2018 10:13 AM     [facility] [place] [address] [place], [place] [address] [phone]

## 2018-02-25 ENCOUNTER — Encounter: Payer: Self-pay | Admitting: Physician Assistant

## 2018-03-12 ENCOUNTER — Other Ambulatory Visit: Payer: Self-pay | Admitting: Physician Assistant

## 2018-03-13 ENCOUNTER — Telehealth: Payer: Self-pay | Admitting: Cardiovascular Disease

## 2018-03-13 NOTE — Telephone Encounter (Signed)
lmov to schedule appt Dr. Kirke CorinArida

## 2018-03-13 NOTE — Telephone Encounter (Signed)
-----   Message from Bryan JewKathy M Woodward, [place] sent at 03/12/2018  3:49 PM EDT ----- Please schedule pt for follow up has canceled and no showed several times.

## 2018-03-18 ENCOUNTER — Other Ambulatory Visit: Payer: Self-pay | Admitting: Physician Assistant

## 2018-03-18 NOTE — Telephone Encounter (Signed)
Attempted to schedule .  Wrong number.  Will mail letter.

## 2018-04-02 ENCOUNTER — Other Ambulatory Visit: Payer: Self-pay | Admitting: Physician Assistant

## 2018-04-07 ENCOUNTER — Other Ambulatory Visit: Payer: Self-pay

## 2018-04-07 ENCOUNTER — Ambulatory Visit: Payer: Medicaid Other

## 2018-04-07 ENCOUNTER — Ambulatory Visit
Admission: EM | Admit: 2018-04-07 | Discharge: 2018-04-07 | Disposition: A | Discharge status: Home / Self Care | Payer: Medicaid Other | Attending: Emergency Medicine | Admitting: Emergency Medicine

## 2018-04-07 DIAGNOSIS — Z7982 Long term (current) use of aspirin: Secondary | ICD-10-CM | POA: Diagnosis not present

## 2018-04-07 DIAGNOSIS — I10 Essential (primary) hypertension: Secondary | ICD-10-CM | POA: Insufficient documentation

## 2018-04-07 DIAGNOSIS — I252 Old myocardial infarction: Secondary | ICD-10-CM | POA: Diagnosis not present

## 2018-04-07 DIAGNOSIS — M1990 Unspecified osteoarthritis, unspecified site: Secondary | ICD-10-CM | POA: Diagnosis not present

## 2018-04-07 DIAGNOSIS — F17210 Nicotine dependence, cigarettes, uncomplicated: Secondary | ICD-10-CM | POA: Insufficient documentation

## 2018-04-07 DIAGNOSIS — I700 Atherosclerosis of aorta: Secondary | ICD-10-CM | POA: Diagnosis not present

## 2018-04-07 DIAGNOSIS — R911 Solitary pulmonary nodule: Secondary | ICD-10-CM | POA: Diagnosis not present

## 2018-04-07 DIAGNOSIS — Z8249 Family history of ischemic heart disease and other diseases of the circulatory system: Secondary | ICD-10-CM | POA: Diagnosis not present

## 2018-04-07 DIAGNOSIS — R05 Cough: Secondary | ICD-10-CM

## 2018-04-07 DIAGNOSIS — Z955 Presence of coronary angioplasty implant and graft: Secondary | ICD-10-CM | POA: Diagnosis not present

## 2018-04-07 DIAGNOSIS — Z9889 Other specified postprocedural states: Secondary | ICD-10-CM | POA: Insufficient documentation

## 2018-04-07 DIAGNOSIS — J069 Acute upper respiratory infection, unspecified: Secondary | ICD-10-CM | POA: Insufficient documentation

## 2018-04-07 DIAGNOSIS — R062 Wheezing: Secondary | ICD-10-CM

## 2018-04-07 DIAGNOSIS — I2510 Atherosclerotic heart disease of native coronary artery without angina pectoris: Secondary | ICD-10-CM | POA: Insufficient documentation

## 2018-04-07 DIAGNOSIS — F1210 Cannabis abuse, uncomplicated: Secondary | ICD-10-CM | POA: Diagnosis not present

## 2018-04-07 DIAGNOSIS — J410 Simple chronic bronchitis: Secondary | ICD-10-CM | POA: Diagnosis not present

## 2018-04-07 DIAGNOSIS — Z79899 Other long term (current) drug therapy: Secondary | ICD-10-CM | POA: Diagnosis not present

## 2018-04-07 MED ORDER — GUAIFENESIN-CODEINE 100-10 MG/5ML PO SYRP: 5.0000 mL | ORAL_SOLUTION | Freq: Three times a day (TID) | ORAL | 0 refills | Status: DC | PRN

## 2018-04-07 MED ORDER — IPRATROPIUM-ALBUTEROL 0.5-2.5 (3) MG/3ML IN SOLN
3.0000 mL | Freq: Once | RESPIRATORY_TRACT | Status: AC
  Administered 2018-04-07: 3 mL via RESPIRATORY_TRACT

## 2018-04-07 MED ORDER — ALBUTEROL SULFATE HFA 108 (90 BASE) MCG/ACT IN AERS: 1.0000 | INHALATION_SPRAY | Freq: Four times a day (QID) | RESPIRATORY_TRACT | 0 refills | Status: DC | PRN

## 2018-04-07 MED ORDER — DOXYCYCLINE HYCLATE 100 MG PO CAPS: 100.0000 mg | ORAL_CAPSULE | Freq: Two times a day (BID) | ORAL | 0 refills | Status: DC

## 2018-04-07 MED ORDER — BENZONATATE 200 MG PO CAPS: ORAL_CAPSULE | ORAL | 0 refills | Status: DC

## 2018-04-07 NOTE — Discharge Instructions (Addendum)
Do not perform activities requiring judgment or concentration and do not drive while taking the cough syrup.  If not improving should follow-up with your primary care physician.  Use caution with sun exposure while taking the doxycycline.

## 2018-04-07 NOTE — ED Triage Notes (Addendum)
Pt with one week of cough, chest congestion, headache. Pt is out of his inhaler and needs a refill

## 2018-04-07 NOTE — ED Provider Notes (Signed)
[facility]    CSN: [id number] Arrival date & time: 04/07/18  1043     History   Chief Complaint Chief Complaint  Patient presents with  . Cough    HPI Bryan Ibarra. is a 62 y.o. male.   HPI  62 year old male with a  8 day history of cough, chest congestion and headache; he states he has not been able to improve only worsens.   He had difficulty sleeping last night because the amount of coughing and shortness of breath.  He has been on albuterol inhaler in the past but ran out of the prescription and has not been using it.  He has had chills but no measurable fever.  His O2 sats are 96% on room air.        Past Medical History:  Diagnosis Date  . Alcohol abuse    a. up to a 12 pack of beer/day.  . Arthritis   . CAD in native artery    a. LHC 6/18: severe 1 vessel CAD with 90% thrombotic stenosis in the proximal LAD which was felt to be the culprit for the patient's non-STEMI. He underwent successful PCI/DES with a resolute onyx drug-eluting stent (3.0 x 18 mm). There was 0% residual stenosis. LVEF estimated at 55-65% by visual estimate. LVEDP was mildly elevated  . H/O echocardiogram    a. echo 6/18: EF 60-65%, normal wall motion, grade 1 diastolic dysfunction, RV cavity size was normal with normal wall thickness and normal RV systolic function. There were no significant valvular abnormalities  . Hypertension   . Marijuana abuse    a. occasional.  . Tobacco abuse     Patient Active Problem List   Diagnosis Date Noted  . NSTEMI (non-ST elevated myocardial infarction) (HCC) 01/01/2017    Past Surgical History:  Procedure Laterality Date  . APPENDECTOMY    . CORONARY STENT INTERVENTION N/A 01/01/2017   Procedure: Coronary Stent Intervention;  Surgeon: Iran Ouch, MD;  Location: [facility] INVASIVE CV [facility];  Service: Cardiovascular;  Laterality: N/A;  . KNEE SURGERY    . LEFT HEART CATH AND CORONARY ANGIOGRAPHY N/A 01/01/2017   Procedure: Left Heart  Cath and Coronary Angiography;  Surgeon: Iran Ouch, MD;  Location: [facility] INVASIVE CV LAB;  Service: Cardiovascular;  Laterality: N/A;       Home Medications    Prior to Admission medications   Medication Sig Start Date End Date Taking? Authorizing Provider  albuterol (PROVENTIL HFA;VENTOLIN HFA) 108 (90 Base) MCG/ACT inhaler Inhale 1-2 puffs into the lungs every 6 (six) hours as needed for wheezing or shortness of breath. Use with spacer 04/07/18   Lutricia Feil, PA-C  aspirin EC 81 MG tablet Take 1 tablet (81 mg total) by mouth daily. 01/03/17   Milagros Loll, MD  benzonatate (TESSALON) 200 MG capsule Take one cap TID PRN cough 04/07/18   Lutricia Feil, PA-C  carvedilol (COREG) 6.25 MG tablet TAKE 1 TABLET(6.25 MG) BY MOUTH TWICE DAILY WITH A MEAL 01/29/18   Dunn, Raymon Mutton, PA-C  clopidogrel (PLAVIX) 75 MG tablet Take 1 tablet (75 mg total) by mouth daily. 06/01/17   Iran Ouch, MD  doxycycline (VIBRAMYCIN) 100 MG capsule Take 1 capsule (100 mg total) by mouth 2 (two) times daily. 04/07/18   Lutricia Feil, PA-C  guaiFENesin-codeine (CHERATUSSIN AC) 100-10 MG/5ML syrup Take 5 mLs by mouth 3 (three) times daily as needed for cough. 04/07/18   Lutricia Feil, PA-C  lisinopril (PRINIVIL,ZESTRIL) 40 MG tablet Take 1 tablet (40 mg total) by mouth daily. 06/01/17 08/30/17  Iran Ouch, MD  Melatonin 10 MG CAPS Take 1 capsule by mouth at bedtime.    [provider]  rosuvastatin (CRESTOR) 10 MG tablet Take 1 tablet (10 mg total) by mouth daily. 06/01/17 08/30/17  Iran Ouch, MD    Family History Family History  Problem Relation Age of Onset  . CAD Father        s/p CABG in his 54's or 25's.  Marland Kitchen CAD Paternal Grandfather     Social History Social History   Tobacco Use  . Smoking status: Current Every Day Smoker    Packs/day: 0.25    Types: Cigarettes  . Smokeless tobacco: Never Used  . Tobacco comment: smokes 2 cigarettes daily.   Substance Use Topics  .  Alcohol use: Yes    Comment: 12 pack of beer most days of the week.  . Drug use: Yes    Types: Marijuana    Comment: occasional MJ.  Remote cocaine.     Allergies   Patient has no known allergies.   Review of Systems Review of Systems  Constitutional: Positive for activity change, chills and fatigue. Negative for appetite change, diaphoresis and fever.  HENT: Positive for congestion, postnasal drip and rhinorrhea.   Respiratory: Positive for cough, shortness of breath and wheezing.   All other systems reviewed and are negative.    Physical Exam Triage Vital Signs ED Triage Vitals  Enc Vitals Group     BP 04/07/18 1056 (!) 140/95     Pulse Rate 04/07/18 1056 79     Resp 04/07/18 1056 20     Temp 04/07/18 1056 98 F (36.7 C)     Temp Source 04/07/18 1056 Oral     SpO2 04/07/18 1056 96 %     Weight 04/07/18 1057 198 lb (89.8 kg)     Height 04/07/18 1057 6\' 2"  (1.88 m)     Head Circumference --      Peak Flow --      Pain Score 04/07/18 1056 8     Pain Loc --      Pain Edu? --      Excl. in GC? --    No data found.  Updated Vital Signs BP (!) 140/95 (BP Location: Left Arm)   Pulse 79   Temp 98 F (36.7 C) (Oral)   Resp 20   Ht 6\' 2"  (1.88 m)   Wt 198 lb (89.8 kg)   SpO2 96%   BMI 25.42 kg/m   Visual Acuity Right Eye Distance:   Left Eye Distance:   Bilateral Distance:    Right Eye Near:   Left Eye Near:    Bilateral Near:     Physical Exam  Constitutional: He is oriented to person, place, and time. He appears well-developed and well-nourished. No distress.  HENT:  Head: Normocephalic.  Right Ear: External ear normal.  Left Ear: External ear normal.  Nose: Nose normal.  Mouth/Throat: Oropharynx is clear and moist. No oropharyngeal exudate.  Eyes: Pupils are equal, round, and reactive to light. Right eye exhibits no discharge. Left eye exhibits no discharge.  Neck: Normal range of motion.  Pulmonary/Chest: Effort normal. No stridor. No respiratory  distress. He has wheezes. He has rales.  Musculoskeletal: Normal range of motion.  Lymphadenopathy:    He has no cervical adenopathy.  Neurological: He is alert and oriented to person, place,  and time.  Skin: Skin is warm and dry. He is not diaphoretic.  Psychiatric: He has a normal mood and affect. His behavior is normal. Judgment and thought content normal.  Nursing note and vitals reviewed.    UC Treatments / Results  Labs (all labs ordered are listed, but only abnormal results are displayed) Labs Reviewed - No data to display  EKG None  Radiology Dg Chest 2 View  Result Date: 04/07/2018 CLINICAL DATA:  Cough for 1 week.  Smoker. EXAM: CHEST - 2 VIEW COMPARISON:  Chest CT from 01/11/2017 FINDINGS: Old bilateral rib deformities. Atherosclerotic calcification of the aortic arch. Mild nodularity on the lateral projection anterior to the spine probably represents the small nodule seen on recent chest CT (5 mm diameter). Coronary stent noted. IMPRESSION: 1. No cause for cough is identified. 2. The small right chest CT lower lobe nodule seen on prior may be faintly visible on the lateral projection. 3.  Atherosclerotic calcification of the aortic arch. Electronically Signed   By: Gaylyn Rong M.D.   On: 04/07/2018 13:32    Procedures Procedures (including critical care time)  Medications Ordered in UC Medications  ipratropium-albuterol (DUONEB) 0.5-2.5 (3) MG/3ML nebulizer solution 3 mL (3 mLs Nebulization Given 04/07/18 1248)    Initial Impression / Assessment and Plan / UC Course  I have reviewed the triage vital signs and the nursing notes.  Pertinent labs & imaging results that were available during my care of the patient were reviewed by me and considered in my medical decision making (see chart for details).     Drink plenty of fluids and rest as much as possible.  Prescribed medicine for cough suppressant.  I have refilled his albuterol inhaler that he may use for  shortness of breath or wheezing.  Because of a possible component of COPD have also started him on doxycycline.  If these are not improving should follow-up with his primary care physician Final Clinical Impressions(s) / UC Diagnoses   Final diagnoses:  Upper respiratory tract infection, unspecified type     Discharge Instructions     Do not perform activities requiring judgment or concentration and do not drive while taking the cough syrup.  If not improving should follow-up with your primary care physician.  Use caution with sun exposure while taking the doxycycline.    ED Prescriptions    Medication Sig Dispense Auth. Provider   albuterol (PROVENTIL HFA;VENTOLIN HFA) 108 (90 Base) MCG/ACT inhaler Inhale 1-2 puffs into the lungs every 6 (six) hours as needed for wheezing or shortness of breath. Use with spacer 1 Inhaler Lutricia Feil, PA-C   benzonatate (TESSALON) 200 MG capsule Take one cap TID PRN cough 30 capsule Ovid Curd P, PA-C   doxycycline (VIBRAMYCIN) 100 MG capsule Take 1 capsule (100 mg total) by mouth 2 (two) times daily. 14 capsule Ovid Curd P, PA-C   guaiFENesin-codeine (CHERATUSSIN AC) 100-10 MG/5ML syrup Take 5 mLs by mouth 3 (three) times daily as needed for cough. 60 mL Lutricia Feil, PA-C     Controlled Substance Prescriptions [place] Controlled Substance Registry consulted? Not Applicable   Lutricia Feil, PA-C 04/07/18 1348

## 2018-05-07 ENCOUNTER — Other Ambulatory Visit: Payer: Self-pay | Admitting: Physician Assistant

## 2018-06-05 ENCOUNTER — Other Ambulatory Visit: Payer: Self-pay | Admitting: Physician Assistant

## 2018-06-22 NOTE — Progress Notes (Signed)
Cardiology Office Note Date:  06/24/2018  Patient ID:  Bryan Ibarra., DOB April 18, 1956, MRN [id number] PCP:  Patient, No Pcp Per  Cardiologist:  Dr. Kirke Corin, MD    Chief Complaint: Follow up  History of Present Illness: Bryan Ibarra. is a 62 y.o. male with history of CAD s/p NSTEMI in 12/2016 s/p PCI to the LAD as below, hypertension, polysubstance abuse with tobacco, alcohol, marijuana, and remote cocaine abuse who presents for follow-up of his CAD.   Patient was admitted to [facility] in 12/2016, after developing severe substernal chest pain radiating down both arms with associated nausea, dyspnea, and paresthesias of the fingers. EKG with borderline ST elevation and frequent PVCs. Troponin peaked at 5.49. CXR showed aortic atherosclerosis with left carotid artery calcification. No edema or consolidation noted. CTA chest showed no thoracic aortic aneurysm or dissection. No pulmonary embolus. Scattered foci of atherosclerotic calcification in the aorta as well as foci of CAD were noted. A 5 mm nodular opacity in the right lower lobe medial segment was noted. He underwent cardiac cath that showed severe 1 vessel CAD with 90% thrombotic stenosis in the proximal LAD, which was felt to be the culprit for the patient's NSTEMI. He underwent successful PCI/DES to the LAD. LVEF estimated at 55-65% by visual estimate. LVEDP was mildly elevated. Echo post intervention showed EF 60-65%, normal wall motion, Gr1DD, RV cavity size was normal with normal wall thickness and normal RVSF. There were no significant valvular abnormalities. Post intervention he did have rare episode of NSVT at 11 beats. He was asymptomatic. He did not tolerate high-dose Lipitor secondary to myalgias. He was subsequently changed from Brilinta to Effient secondary to SOB in 01/2017. However, in follow up in 05/2017, it was noted he was not taking Effient. When he was last seen, he continued to smoke approximately 4 cigarettes daily and  drink ~ 6 beers a day. He was started on Plavix in addition to ASA given concern for late stent thrombosis. Follow up labs were advised, though not completed. He was advised to follow up in 3 months, though did not.   Patient comes in today noting no chest pain since his MI. He has noted a continued SOB with exertion that predates his MI that had been stable until the past 2 months when he began to note an increase in SOB while doing yard work. He has attributed this to allergies. No exertional chest pain. He continues to note dizziness, which also predates his MI. He reports he has been out of his medications since 11/22, though review of his pill bottles indicates his Coreg was last filled on 01/29/2018, lisinopril on 05/07/18, Crestor 03/12/18, Plavix 03/12/2018, and for unclear reasons Effient 05/07/2018. Of note, the Plavix prescription on 8/13 was for 90 days. He notes easy bruising. No falls since he was last seen. No BRBPR or melena. He continues to smoke 3-4 cigarettes daily and drink 4-5 beers daily. Currently chest pain free with breathing at baseline.   Past Medical History:  Diagnosis Date  . Alcohol abuse    a. up to a 12 pack of beer/day.  . Arthritis   . CAD in native artery    a. LHC 6/18: severe 1 vessel CAD with 90% thrombotic stenosis in the proximal LAD which was felt to be the culprit for the patient's non-STEMI. He underwent successful PCI/DES with a resolute onyx drug-eluting stent (3.0 x 18 mm). There was 0% residual stenosis. LVEF estimated at 55-65% by  visual estimate. LVEDP was mildly elevated  . H/O echocardiogram    a. echo 6/18: EF 60-65%, normal wall motion, grade 1 diastolic dysfunction, RV cavity size was normal with normal wall thickness and normal RV systolic function. There were no significant valvular abnormalities  . Hypertension   . Marijuana abuse    a. occasional.  . Tobacco abuse     Past Surgical History:  Procedure Laterality Date  . APPENDECTOMY    .  CORONARY STENT INTERVENTION N/A 01/01/2017   Procedure: Coronary Stent Intervention;  Surgeon: Iran Ouch, MD;  Location: [facility] INVASIVE CV [facility];  Service: Cardiovascular;  Laterality: N/A;  . KNEE SURGERY    . LEFT HEART CATH AND CORONARY ANGIOGRAPHY N/A 01/01/2017   Procedure: Left Heart Cath and Coronary Angiography;  Surgeon: Iran Ouch, MD;  Location: [facility] INVAS[facility];  Service: Cardiovascular;  Laterality: N/A;    Current Meds  Medication Sig  . albuterol (PROVENTIL HFA;VENTOLIN HFA) 108 (90 Base) MCG/ACT inhaler Inhale 1-2 puffs into the lungs every 6 (six) hours as needed for wheezing or shortness of breath. Use with spacer  . aspirin EC 81 MG tablet Take 1 tablet (81 mg total) by mouth daily.  . carvedilol (COREG) 6.25 MG tablet TAKE 1 TABLET(6.25 MG) BY MOUTH TWICE DAILY WITH A MEAL  . clopidogrel (PLAVIX) 75 MG tablet Take 1 tablet (75 mg total) by mouth daily.  Marland Kitchen lisinopril (PRINIVIL,ZESTRIL) 10 MG tablet TAKE 1 TABLET(10 MG) BY MOUTH DAILY  . prasugrel (EFFIENT) 10 MG TABS tablet TAKE 1 TABLET(10 MG) BY MOUTH DAILY    Allergies:   Patient has no known allergies.   Social History:  The patient  reports that he has been smoking cigarettes. He has been smoking about 0.25 packs per day. He has never used smokeless tobacco. He reports that he drinks alcohol. He reports that he has current or past drug history. Drug: Marijuana.   Family History:  The patient's family history includes CAD in his father and paternal grandfather.  ROS:   Review of Systems  Constitutional: Positive for malaise/fatigue. Negative for chills, diaphoresis, fever and weight loss.  HENT: Negative for congestion.   Eyes: Negative for discharge and redness.  Respiratory: Positive for shortness of breath. Negative for cough, hemoptysis, sputum production and wheezing.   Cardiovascular: Negative for chest pain, palpitations, orthopnea, claudication, leg swelling and PND.  Gastrointestinal: Negative  for abdominal pain, blood in stool, heartburn, melena, nausea and vomiting.  Genitourinary: Negative for hematuria.  Musculoskeletal: Negative for falls and myalgias.  Skin: Negative for rash.  Neurological: Positive for dizziness, tremors and weakness. Negative for tingling, sensory change, speech change, focal weakness and loss of consciousness.  Endo/Heme/Allergies: Bruises/bleeds easily.  Psychiatric/Behavioral: Negative for substance abuse. The patient is nervous/anxious.   All other systems reviewed and are negative.    PHYSICAL EXAM:  VS:  BP (!) 178/100 (BP Location: Left Arm, Patient Position: Sitting, Cuff Size: Normal)   Pulse 81   Ht 6\' 2"  (1.88 m)   Wt 196 lb 8 oz (89.1 kg)   BMI 25.23 kg/m  BMI: Body mass index is 25.23 kg/m.  Physical Exam  Constitutional: He is oriented to person, place, and time. He appears well-developed and well-nourished.  HENT:  Head: Normocephalic and atraumatic.  Eyes: Right eye exhibits no discharge. Left eye exhibits no discharge.  Neck: Normal range of motion. No JVD present.  Cardiovascular: Normal rate, regular rhythm, S1 normal, S2 normal and normal heart sounds.  Exam reveals no distant heart sounds, no friction rub, no midsystolic click and no opening snap.  No murmur heard. Pulses:      Posterior tibial pulses are 2+ on the right side, and 2+ on the left side.  Pulmonary/Chest: Effort normal and breath sounds normal. No respiratory distress. He has no decreased breath sounds. He has no wheezes. He has no rales. He exhibits no tenderness.  Abdominal: Soft. He exhibits no distension. There is no tenderness.  Musculoskeletal: He exhibits no edema.  Neurological: He is alert and oriented to person, place, and time.  Skin: Skin is warm and dry. No cyanosis. Nails show no clubbing.  Psychiatric: He has a normal mood and affect. His speech is normal and behavior is normal. Judgment and thought content normal.     EKG:  Was ordered and  interpreted by me today. Shows NSR, 81 bpm, left axis deviation, prior septal infarct, poor R wave progression, nonspecific st/t changes  Recent Labs: No results found for requested labs within last 8760 hours.  No results found for requested labs within last 8760 hours.   CrCl cannot be calculated (Patient's most recent lab result is older than the maximum 21 days allowed.).   Wt Readings from Last 3 Encounters:  06/24/18 196 lb 8 oz (89.1 kg)  04/07/18 198 lb (89.8 kg)  07/27/17 200 lb (90.7 kg)     Other studies reviewed: Additional studies/records reviewed today include: summarized above  ASSESSMENT AND PLAN:  1. CAD involving the native coronary arteries without angina: No chest pain. He is now > 12 months out from his PCI. Continue ASA 81 mg daily indefinitely. It does appear he was getting both Plavix and Effient filled with overlapping dates at [facility] for unclear reasons. He got a 90 day supply of Plavix filled on 03/12/18 followed by a 30 day supply of Effient on 05/07/2018. He is uncertain if he was taking both of these medications at the same time or not. We have called [facility] to advise them to take both of these medications off his list. Safety Zone has been placed. Aggressive risk factor modification and secondary prevention. Restart Coreg and Crestor as below.   2. Dyspnea: Long standing issue that predates his MI, though he feels like his SOB has been worse for about the past 2 months. Check echo. Cannot exclude underlying pulmonary disease given his tobacco abuse. Symptoms improve with albuterol, which he requests a refill of today. I recommend he be evaluated by his PCP and further refills will need to come from PCP.   3. Dizziness: Long stand issue that pre-dates his MI. Check CBC, CMP, and TSH. No associated palpitations or bruit heard on exam. Recommend he follow up with his PCP.   4. HTN: Blood pressure is poorly controlled today. Recheck blood pressure of 180/100.  He indicates he has been out of Coreg and lisinopril since 11/22, however review of his pill bottles indicates he last filled Coreg on 01/29/2018 and lisinopril on 05/07/18. Restart Coreg and lisinopril.   5. HLD: LDL of 72 from 12/2016. Goal LDL < 70. Check lipid panel, direct-LDL, and CMP today. Restart Crestor 10 mg daily (last filled on 03/12/18).   6. Tobacco abuse: Complete cessation is advised. He is not smoking 3-4 cigarettes daily.   7. Pulmonary nodule: Noted by CTA chest in 12/2016 with prior recommended CT chest in 12/2017, though this did not occur. Schedule noncontrast chest CT.  8. Alcohol abuse: Complete cessation is advised.  9. Tremor: Patient states he was nervous for his appointment today as he was almost too late to be seen.   Disposition: F/u with Dr. Kirke CorinArida or an APP in 3 months.    Current medicines are reviewed at length with the patient today.  The patient did not have any concerns regarding medicines.  Signed, Eula Listenyan , PA-C 06/24/2018 3:29 PM     [facility] [address] [place], [place] [address] [phone]

## 2018-06-24 ENCOUNTER — Ambulatory Visit (INDEPENDENT_AMBULATORY_CARE_PROVIDER_SITE_OTHER): Payer: Medicaid Other | Admitting: Physician Assistant

## 2018-06-24 ENCOUNTER — Other Ambulatory Visit
Admission: RE | Admit: 2018-06-24 | Discharge: 2018-06-24 | Disposition: A | Discharge status: Home / Self Care | Payer: Medicaid Other | Source: Ambulatory Visit | Attending: Physician Assistant | Admitting: Physician Assistant

## 2018-06-24 VITALS — BP 178/100 | HR 81 | Ht 74.0 in | Wt 196.5 lb

## 2018-06-24 DIAGNOSIS — R9389 Abnormal findings on diagnostic imaging of other specified body structures: Secondary | ICD-10-CM

## 2018-06-24 DIAGNOSIS — I2510 Atherosclerotic heart disease of native coronary artery without angina pectoris: Secondary | ICD-10-CM

## 2018-06-24 DIAGNOSIS — R0602 Shortness of breath: Secondary | ICD-10-CM

## 2018-06-24 DIAGNOSIS — R42 Dizziness and giddiness: Secondary | ICD-10-CM | POA: Diagnosis not present

## 2018-06-24 DIAGNOSIS — R911 Solitary pulmonary nodule: Secondary | ICD-10-CM

## 2018-06-24 DIAGNOSIS — E785 Hyperlipidemia, unspecified: Secondary | ICD-10-CM

## 2018-06-24 DIAGNOSIS — I10 Essential (primary) hypertension: Secondary | ICD-10-CM

## 2018-06-24 DIAGNOSIS — Z720 Tobacco use: Secondary | ICD-10-CM

## 2018-06-24 DIAGNOSIS — F1010 Alcohol abuse, uncomplicated: Secondary | ICD-10-CM

## 2018-06-24 LAB — CBC
HCT: 44.4 % (ref 39.0–52.0)
Hemoglobin: 15.7 g/dL (ref 13.0–17.0)
MCH: 33.3 pg (ref 26.0–34.0)
MCHC: 35.4 g/dL (ref 30.0–36.0)
MCV: 94.3 fL (ref 80.0–100.0)
PLATELETS: 166 10*3/uL (ref 150–400)
RBC: 4.71 MIL/uL (ref 4.22–5.81)
RDW: 12.4 % (ref 11.5–15.5)
WBC: 6.6 10*3/uL (ref 4.0–10.5)
nRBC: 0 % (ref 0.0–0.2)

## 2018-06-24 LAB — COMPREHENSIVE METABOLIC PANEL
ALBUMIN: 4.5 g/dL (ref 3.5–5.0)
ALK PHOS: 79 U/L (ref 38–126)
ALT: 92 U/L — AB (ref 0–44)
AST: 62 U/L — ABNORMAL HIGH (ref 15–41)
Anion gap: 9 (ref 5–15)
BILIRUBIN TOTAL: 1.9 mg/dL — AB (ref 0.3–1.2)
BUN: 10 mg/dL (ref 8–23)
CALCIUM: 9.8 mg/dL (ref 8.9–10.3)
CO2: 28 mmol/L (ref 22–32)
CREATININE: 0.59 mg/dL — AB (ref 0.61–1.24)
Chloride: 104 mmol/L (ref 98–111)
GFR calc Af Amer: >60 mL/min (ref >60)
GFR calc non Af Amer: >60 mL/min (ref >60)
GLUCOSE: 94 mg/dL (ref 70–99)
Potassium: 4.5 mmol/L (ref 3.5–5.1)
SODIUM: 141 mmol/L (ref 135–145)
TOTAL PROTEIN: 7.4 g/dL (ref 6.5–8.1)

## 2018-06-24 LAB — LIPID PANEL
Cholesterol: 218 mg/dL — ABNORMAL HIGH (ref 0–200)
HDL: 111 mg/dL (ref >40)
LDL CALC: 95 mg/dL (ref 0–99)
Total CHOL/HDL Ratio: 2 RATIO
Triglycerides: 62 mg/dL (ref <150)
VLDL: 12 mg/dL (ref 0–40)

## 2018-06-24 LAB — TSH: TSH: 1.33 u[IU]/mL (ref 0.350–4.500)

## 2018-06-24 MED ORDER — LISINOPRIL 10 MG PO TABS: ORAL_TABLET | ORAL | 3 refills | Status: DC

## 2018-06-24 MED ORDER — ROSUVASTATIN CALCIUM 10 MG PO TABS: 10.0000 mg | ORAL_TABLET | Freq: Every day | ORAL | 3 refills | Status: DC

## 2018-06-24 MED ORDER — CARVEDILOL 6.25 MG PO TABS: ORAL_TABLET | ORAL | 3 refills | Status: DC

## 2018-06-24 NOTE — Patient Instructions (Signed)
Medication Instructions:  STOP the Plavix STOP the Effient  If you need a refill on your cardiac medications before your next appointment, please call your pharmacy.   Lab work: Your provider would like for you to have the following labs today: CBC, CMET, TSH, Lipid and Direct LDL  If you have labs (blood work) drawn today and your tests are completely normal, you will receive your results only by: Marland Kitchen. MyChart Message (if you have MyChart) OR . A paper copy in the mail If you have any lab test that is abnormal or we need to change your treatment, we will call you to review the results.  Testing/Procedures: Your physician has requested that you have an echocardiogram. Echocardiography is a painless test that uses sound waves to create images of your heart. It provides your doctor with information about the size and shape of your heart and how well your heart's chambers and valves are working. You may receive an ultrasound enhancing agent through an IV if needed to better visualize your heart during the echo.This procedure takes approximately one hour. There are no restrictions for this procedure. [facility].   Your provider has ordered a CT of the chest without contrast. Non-Cardiac CT scanning, (CAT scanning), is a noninvasive, special x-ray that produces cross-sectional images of the body using x-rays and a computer. CT scans help physicians diagnose and treat medical conditions. For some CT exams, a contrast material is used to enhance visibility in the area of the body being studied. CT scans provide greater clarity and reveal more details than regular x-ray exams.  Please call [phone] to schedule.   Follow up: Your physician recommends that you schedule a follow-up appointment in: 3 months with Dr. Kirke CorinArida.

## 2018-06-24 NOTE — Addendum Note (Signed)
Addended by: Sandi MariscalICHARDSON,  Y on: 06/24/2018 04:45 PM   Modules accepted: Orders

## 2018-06-25 ENCOUNTER — Telehealth: Payer: Self-pay

## 2018-06-25 ENCOUNTER — Telehealth: Payer: Self-pay | Admitting: Cardiovascular Disease

## 2018-06-25 ENCOUNTER — Other Ambulatory Visit: Payer: Self-pay | Admitting: Physician Assistant

## 2018-06-25 DIAGNOSIS — R7989 Other specified abnormal findings of blood chemistry: Secondary | ICD-10-CM

## 2018-06-25 DIAGNOSIS — R945 Abnormal results of liver function studies: Principal | ICD-10-CM

## 2018-06-25 DIAGNOSIS — R0602 Shortness of breath: Secondary | ICD-10-CM

## 2018-06-25 LAB — LDL CHOLESTEROL, DIRECT

## 2018-06-25 NOTE — Telephone Encounter (Signed)
Call to patient to discuss lab values and provider suggestions.  LMTCB.

## 2018-06-25 NOTE — Telephone Encounter (Signed)
°*  STAT* If patient is at the pharmacy, call can be transferred to refill team.   1. Which medications need to be refilled? (please list name of each medication and dose if known) albuterol (PROVENTIL HFA;VENTOLIN HFA) 108 (90 Base) MCG/ACT inhaler [[id number]][[id number]] q 6hr prn  2. Which pharmacy/location (including street and city if local pharmacy) is medication to be sent to? [facility] [address]    3. Do they need a 30 day or 90 day supply? 90  Patient says he has no pcp and Dr. Kirke CorinArida usually refills for him

## 2018-06-25 NOTE — Telephone Encounter (Signed)
Please advise if ok to refill Inhaler not cardiac medication. Pt doesn't have PCP.

## 2018-06-25 NOTE — Telephone Encounter (Signed)
-----   Message from Sondra Bargesyan M Dunn, PA-C sent at 06/25/2018  7:26 AM EST ----- Renal function is normal.  Liver function is mildly elevated.  Potassium at goal.  Random glucose is normal.  LDL 95.  Thyroid function normal.  Blood count is normal.   Recommend he cut back on alcohol given his elevated LFT.  LDL is not at goal of < 70.  Taper alcohol to complete cessation.  Recommend follow up hepatic function panel in 4 weeks to trend liver function.  For now, continue Crestor (had been off at follow up appointment) If LFT remains elevated in recheck, he will need to follow up with his PCP including imaging.

## 2018-06-26 ENCOUNTER — Telehealth: Payer: Self-pay | Admitting: Cardiovascular Disease

## 2018-06-26 MED ORDER — ALBUTEROL SULFATE HFA 108 (90 BASE) MCG/ACT IN AERS: 1.0000 | INHALATION_SPRAY | Freq: Four times a day (QID) | RESPIRATORY_TRACT | 0 refills | Status: DC | PRN

## 2018-06-26 NOTE — Telephone Encounter (Signed)
This has been taken care of in previous phone encounter.

## 2018-06-26 NOTE — Telephone Encounter (Signed)
Results called to pt. Pt verbalized understanding of results and plan of care. [facility] is [facility] the last week of December or first week of January. Order entered.

## 2018-06-26 NOTE — Telephone Encounter (Signed)
Patient verbalized understanding that we can send in one refill and that he would need to f/u with PCP for further refills. Rx sent to pharmacy.

## 2018-06-26 NOTE — Telephone Encounter (Signed)
Ok to refill x 1. Further refills deferred to PCP.

## 2018-06-26 NOTE — Telephone Encounter (Signed)
Please call regarding pt inhaler, states he was supposed to get one called in, but was not.

## 2018-07-04 ENCOUNTER — Other Ambulatory Visit: Payer: Medicaid Other

## 2018-07-22 ENCOUNTER — Ambulatory Visit (INDEPENDENT_AMBULATORY_CARE_PROVIDER_SITE_OTHER): Payer: Medicaid Other

## 2018-07-22 DIAGNOSIS — R0602 Shortness of breath: Secondary | ICD-10-CM

## 2018-07-23 ENCOUNTER — Telehealth: Payer: Self-pay | Admitting: *Deleted

## 2018-07-23 NOTE — Telephone Encounter (Signed)
-----   Message from Sondra Bargesyan M Dunn, PA-C sent at 07/23/2018  7:46 AM EST ----- Echo showed normal pump function with normal wall motion and normal relaxing of the heart. Recommend he follow up with his PCP regarding his SOB and underlying asthma.

## 2018-07-23 NOTE — Telephone Encounter (Signed)
No answer. Left message to call back.  Patient also needs to schedule CT chest.

## 2018-07-23 NOTE — Telephone Encounter (Signed)
Patient calling back and verbalized understanding of results and recommendations.  He was also ordered to have a CT of the chest as ordered on office visit 06/24/18.  Patient is in the middle of his family's Christmas breakfast and unable to schedule at this time. He verbalized understanding to call us after Christmas to schedule the test. He was appreciative.

## 2018-08-20 ENCOUNTER — Telehealth: Payer: Self-pay | Admitting: *Deleted

## 2018-08-20 NOTE — Telephone Encounter (Signed)
The patient has been called and made aware that his CT of the chest without contrast [facility] [place] on 08/26/2018 at 10 am. He will need to arrive by 9:45. He has verbalized his understanding.

## 2018-08-26 ENCOUNTER — Ambulatory Visit
Admission: RE | Admit: 2018-08-26 | Discharge: 2018-08-26 | Disposition: A | Discharge status: Home / Self Care | Payer: Medicaid Other | Source: Ambulatory Visit | Attending: Physician Assistant | Admitting: Physician Assistant

## 2018-08-26 DIAGNOSIS — R911 Solitary pulmonary nodule: Secondary | ICD-10-CM | POA: Diagnosis not present

## 2018-09-24 ENCOUNTER — Ambulatory Visit: Payer: Medicaid Other | Admitting: Cardiovascular Disease

## 2018-10-31 ENCOUNTER — Telehealth: Payer: Self-pay | Admitting: *Deleted

## 2018-10-31 NOTE — Telephone Encounter (Signed)
The patient declined an evisit. This has been rescheduled for June.

## 2018-10-31 NOTE — Telephone Encounter (Signed)
Left a message for the patient to call back to reschedule or do a web visit for his appointment on 11/05/2018 with Dr. Kirke Corin.

## 2018-11-05 ENCOUNTER — Ambulatory Visit: Payer: Medicaid Other | Admitting: Cardiovascular Disease

## 2018-11-18 ENCOUNTER — Other Ambulatory Visit: Payer: Self-pay | Admitting: Cardiovascular Disease

## 2019-01-09 ENCOUNTER — Telehealth: Payer: Self-pay | Admitting: Cardiovascular Disease

## 2019-01-09 NOTE — Telephone Encounter (Signed)
Virtual Visit Pre-Appointment Phone Call  "(Name), I am calling you today to discuss your upcoming appointment. We are currently trying to limit exposure to the virus that causes COVID-19 by seeing patients at home rather than in the office."  1. "What is the BEST phone number to call the day of the visit?" - include this in appointment notes  2. Do you have or have access to (through a family member/friend) a smartphone with video capability that we can use for your visit?" a. If yes - list this number in appt notes as cell (if different from BEST phone #) and list the appointment type as a VIDEO visit in appointment notes b. If no - list the appointment type as a PHONE visit in appointment notes  3. Confirm consent - "In the setting of the current Covid19 crisis, you are scheduled for a (phone or video) visit with your provider on (date) at (time).  Just as we do with many in-office visits, in order for you to participate in this visit, we must obtain consent.  If you'd like, I can send this to your mychart (if signed up) or email for you to review.  Otherwise, I can obtain your verbal consent now.  All virtual visits are billed to your insurance company just like a normal visit would be.  By agreeing to a virtual visit, we'd like you to understand that the technology does not allow for your provider to perform an examination, and thus may limit your provider's ability to fully assess your condition. If your provider identifies any concerns that need to be evaluated in person, we will make arrangements to do so.  Finally, though the technology is pretty good, we cannot assure that it will always work on either your or our end, and in the setting of a video visit, we may have to convert it to a phone-only visit.  In either situation, we cannot ensure that we have a secure connection.  Are you willing to proceed?" STAFF: Did the patient verbally acknowledge consent to telehealth visit? Document  YES/NO here: YES  4. Advise patient to be prepared - "Two hours prior to your appointment, go ahead and check your blood pressure, pulse, oxygen saturation, and your weight (if you have the equipment to check those) and write them all down. When your visit starts, your provider will ask you for this information. If you have an Apple Watch or Kardia device, please plan to have heart rate information ready on the day of your appointment. Please have a pen and paper handy nearby the day of the visit as well."  5. Give patient instructions for MyChart download to smartphone OR Doximity/Doxy.me as below if video visit (depending on what platform provider is using)  6. Inform patient they will receive a phone call 15 minutes prior to their appointment time (may be from unknown caller ID) so they should be prepared to answer    TELEPHONE CALL NOTE  Bryan Catalinaorman F Obeso Jr. has been deemed a candidate for a follow-up tele-health visit to limit community exposure during the Covid-19 pandemic. I spoke with the patient via phone to ensure availability of phone/video source, confirm preferred email & phone number, and discuss instructions and expectations.  I reminded Bryan Catalinaorman F Walstad Jr. to be prepared with any vital sign and/or heart rhythm information that could potentially be obtained via home monitoring, at the time of his visit. I reminded Bryan Catalinaorman F Haack Jr. to expect a phone  call prior to his visit.  Bryan Ibarra 01/09/2019 10:41 AM   INSTRUCTIONS FOR DOWNLOADING THE MYCHART APP TO SMARTPHONE  - The patient must first make sure to have activated MyChart and know their login information - If Apple, go to [employer] and type in MyChart in the search bar and download the app. If Android, ask patient to go to [employer] and type in [place] in the search bar and download the app. The app is free but as with any other app downloads, their phone may require them to verify saved payment information or  Apple/Android password.  - The patient will need to then log into the app with their MyChart username and password, and select [facility] as their healthcare provider to link the account. When it is time for your visit, go to the MyChart app, find appointments, and click Begin Video Visit. Be sure to Select Allow for your device to access the Microphone and Camera for your visit. You will then be connected, and your provider will be with you shortly.  **If they have any issues connecting, or need assistance please contact MyChart service desk [phone])**  **If using a computer, in order to ensure the best quality for their visit they will need to use either of the following Internet Browsers: [employer], or Google Chrome**  IF USING DOXIMITY or DOXY.ME - The patient will receive a link just prior to their visit by text.     FULL LENGTH CONSENT FOR TELE-HEALTH VISIT   I hereby voluntarily request, consent and authorize [facility] and its employed or contracted physicians, physician assistants, nurse practitioners or other licensed health care professionals (the Practitioner), to provide me with telemedicine health care services (the Services") as deemed necessary by the treating Practitioner. I acknowledge and consent to receive the Services by the Practitioner via telemedicine. I understand that the telemedicine visit will involve communicating with the Practitioner through live audiovisual communication technology and the disclosure of certain medical information by electronic transmission. I acknowledge that I have been given the opportunity to request an in-person assessment or other available alternative prior to the telemedicine visit and am voluntarily participating in the telemedicine visit.  I understand that I have the right to withhold or withdraw my consent to the use of telemedicine in the course of my care at any time, without affecting my right to future care  or treatment, and that the Practitioner or I may terminate the telemedicine visit at any time. I understand that I have the right to inspect all information obtained and/or recorded in the course of the telemedicine visit and may receive copies of available information for a reasonable fee.  I understand that some of the potential risks of receiving the Services via telemedicine include:   Delay or interruption in medical evaluation due to technological equipment failure or disruption;  Information transmitted may not be sufficient (e.g. poor resolution of images) to allow for appropriate medical decision making by the Practitioner; and/or   In rare instances, security protocols could fail, causing a breach of personal health information.  Furthermore, I acknowledge that it is my responsibility to provide information about my medical history, conditions and care that is complete and accurate to the best of my ability. I acknowledge that Practitioner's advice, recommendations, and/or decision may be based on factors not within their control, such as incomplete or inaccurate data provided by me or distortions of diagnostic images or specimens that may result from  electronic transmissions. I understand that the practice of medicine is not an exact science and that Practitioner makes no warranties or guarantees regarding treatment outcomes. I acknowledge that I will receive a copy of this consent concurrently upon execution via email to the email address I last provided but may also request a printed copy by calling the office of [facility].    I understand that my insurance will be billed for this visit.   I have read or had this consent read to me.  I understand the contents of this consent, which adequately explains the benefits and risks of the Services being provided via telemedicine.   I have been provided ample opportunity to ask questions regarding this consent and the Services and have had  my questions answered to my satisfaction.  I give my informed consent for the services to be provided through the use of telemedicine in my medical care  By participating in this telemedicine visit I agree to the above.

## 2019-01-14 ENCOUNTER — Encounter: Payer: Self-pay | Admitting: Cardiovascular Disease

## 2019-01-14 ENCOUNTER — Other Ambulatory Visit: Payer: Self-pay

## 2019-01-14 ENCOUNTER — Telehealth (INDEPENDENT_AMBULATORY_CARE_PROVIDER_SITE_OTHER): Payer: Medicaid Other | Admitting: Cardiovascular Disease

## 2019-01-14 VITALS — BP 183/109 | HR 65 | Ht 74.0 in | Wt 213.0 lb

## 2019-01-14 DIAGNOSIS — I2510 Atherosclerotic heart disease of native coronary artery without angina pectoris: Secondary | ICD-10-CM | POA: Diagnosis not present

## 2019-01-14 DIAGNOSIS — E785 Hyperlipidemia, unspecified: Secondary | ICD-10-CM

## 2019-01-14 DIAGNOSIS — I10 Essential (primary) hypertension: Secondary | ICD-10-CM

## 2019-01-14 MED ORDER — AMLODIPINE BESYLATE 5 MG PO TABS: 5.0000 mg | ORAL_TABLET | Freq: Every day | ORAL | 2 refills | Status: DC

## 2019-01-14 MED ORDER — ROSUVASTATIN CALCIUM 10 MG PO TABS: ORAL_TABLET | ORAL | 2 refills | Status: DC

## 2019-01-14 MED ORDER — LISINOPRIL 40 MG PO TABS: ORAL_TABLET | ORAL | 3 refills | Status: DC

## 2019-01-14 NOTE — Progress Notes (Signed)
Virtual Visit via Telephone Note   This visit type was conducted due to national recommendations for restrictions regarding the COVID-19 Pandemic (e.g. social distancing) in an effort to limit this patient's exposure and mitigate transmission in our community.  Due to his co-morbid illnesses, this patient is at least at moderate risk for complications without adequate follow up.  This format is felt to be most appropriate for this patient at this time.  The patient did not have access to video technology/had technical difficulties with video requiring transitioning to audio format only (telephone).  All issues noted in this document were discussed and addressed.  No physical exam could be performed with this format.  Please refer to the patient's chart for his  consent to telehealth for [facility].   Date:  01/14/2019   ID:  Bryan CatalinaNorman F Haworth Jr., DOB [birth year], MRN [id number]  Patient Location: Home Provider Location: Office  PCP:  Patient, No Pcp Per  Cardiologist:  Lorine Bears , MD  Electrophysiologist:  None   Evaluation Performed:  Follow-Up Visit  Chief Complaint: Elevated blood pressure and anxiety attacks.  History of Present Illness:    Bryan Catalinaorman F Pytel Jr. is a 63 y.o. male who was reached via phone for follow-up visit regarding coronary artery disease. He had non-ST elevation myocardial infarction in June 2018.  He underwent PCI and drug-eluting stent placement to the LAD without complications.  Ejection fraction was normal.  Other medical problems include hypertension, polysubstance abuse including tobacco, alcohol and marijuana and remote history of cocaine use. He did not tolerate high-dose atorvastatin due to myalgia.  He continues to smoke 1 pack/day.  He reports feeling anxious lately with elevated blood pressure.  He denies chest pain.  He has stable exertional dyspnea. His blood pressure has been very elevated the last 3 visits.  The patient does not have symptoms  concerning for COVID-19 infection (fever, chills, cough, or new shortness of breath).    Past Medical History:  Diagnosis Date  . Alcohol abuse    a. up to a 12 pack of beer/day.  . Arthritis   . CAD in native artery    a. LHC 6/18: severe 1 vessel CAD with 90% thrombotic stenosis in the proximal LAD which was felt to be the culprit for the patient's non-STEMI. He underwent successful PCI/DES with a resolute onyx drug-eluting stent (3.0 x 18 mm). There was 0% residual stenosis. LVEF estimated at 55-65% by visual estimate. LVEDP was mildly elevated  . H/O echocardiogram    a. echo 6/18: EF 60-65%, normal wall motion, grade 1 diastolic dysfunction, RV cavity size was normal with normal wall thickness and normal RV systolic function. There were no significant valvular abnormalities  . Hypertension   . Marijuana abuse    a. occasional.  . Tobacco abuse    Past Surgical History:  Procedure Laterality Date  . APPENDECTOMY    . CORONARY STENT INTERVENTION N/A 01/01/2017   Procedure: Coronary Stent Intervention;  Surgeon: Iran Ouch,  A, MD;  Location: [facility] INVASIVE CV LAB;  Service: Cardiovascular;  Laterality: N/A;  . KNEE SURGERY    . LEFT HEART CATH AND CORONARY ANGIOGRAPHY N/A 01/01/2017   Procedure: Left Heart Cath and Coronary Angiography;  Surgeon: Iran Ouch,  A, MD;  Location: [facility] INVASIVE CV LAB;  Service: Cardiovascular;  Laterality: N/A;     Current Meds  Medication Sig  . albuterol (PROVENTIL HFA;VENTOLIN HFA) 108 (90 Base) MCG/ACT inhaler Inhale 1-2 puffs into the lungs every 6 (six)  hours as needed for wheezing or shortness of breath. Use with spacer.  Marland Kitchen. aspirin EC 81 MG tablet Take 1 tablet (81 mg total) by mouth daily.  . carvedilol (COREG) 6.25 MG tablet TAKE 1 TABLET(6.25 MG) BY MOUTH TWICE DAILY WITH A MEAL  . lisinopril (ZESTRIL) 40 MG tablet TAKE 1 TABLET(40 MG) BY MOUTH DAILY  . rosuvastatin (CRESTOR) 10 MG tablet TAKE 1 TABLET(10 MG) BY MOUTH DAILY  .  [DISCONTINUED] lisinopril (ZESTRIL) 40 MG tablet TAKE 1 TABLET(40 MG) BY MOUTH DAILY  . [DISCONTINUED] rosuvastatin (CRESTOR) 10 MG tablet TAKE 1 TABLET(10 MG) BY MOUTH DAILY     Allergies:   Patient has no known allergies.   Social History   Tobacco Use  . Smoking status: Current Every Day Smoker    Packs/day: 0.25    Types: Cigarettes  . Smokeless tobacco: Never Used  . Tobacco comment: smokes 2 cigarettes daily.   Substance Use Topics  . Alcohol use: Yes    Comment: 12 pack of beer most days of the week.  . Drug use: Yes    Types: Marijuana    Comment: occasional MJ.  Remote cocaine.     Family Hx: The patient's family history includes CAD in his father and paternal grandfather.  ROS:   Please see the history of present illness.     All other systems reviewed and are negative.   Prior CV studies:   The following studies were reviewed today:    Labs/Other Tests and Data Reviewed:    EKG:  No ECG reviewed.  Recent Labs: 06/24/2018: ALT 92; BUN 10; Creatinine, Ser 0.59; Hemoglobin 15.7; Platelets 166; Potassium 4.5; Sodium 141; TSH 1.330   Recent Lipid Panel Lab Results  Component Value Date/Time   CHOL 218 (H) 06/24/2018 05:25 PM   TRIG 62 06/24/2018 05:25 PM   HDL 111 06/24/2018 05:25 PM   CHOLHDL 2.0 06/24/2018 05:25 PM   LDLCALC 95 06/24/2018 05:25 PM   LDLDIRECT <4 06/24/2018 05:25 PM    Wt Readings from Last 3 Encounters:  01/14/19 213 lb (96.6 kg)  06/24/18 196 lb 8 oz (89.1 kg)  04/07/18 198 lb (89.8 kg)     Objective:    Vital Signs:  BP (!) 183/109   Pulse 65   Ht 6\' 2"  (1.88 m)   Wt 213 lb (96.6 kg)   BMI 27.35 kg/m    VITAL SIGNS:  reviewed  ASSESSMENT & PLAN:     1.  Coronary artery disease involving native coronary arteries without angina: He is doing reasonably well overall with no anginal symptoms.  Continue aspirin and rosuvastatin.  2.  Essential hypertension: Blood pressure continues to be elevated.  Not able to increase  carvedilol due to relatively low resting heart rate.  I elected to add amlodipine 5 mg once daily.  Continue lisinopril.  If blood pressure remains elevated in spite of 3 antihypertensive medications, we should consider work-up for secondary hypertension including renal artery stenosis.  3.  Hyperlipidemia: He is tolerating small dose rosuvastatin.  He has history of elevated liver enzymes likely due to excessive alcohol use and thus been hesitant to increase rosuvastatin further.  4.  Tobacco use: I discussed with him the importance of smoking cessation.    COVID-19 Education: The signs and symptoms of COVID-19 were discussed with the patient and how to seek care for testing (follow up with PCP or arrange E-visit).  The importance of social distancing was discussed today.  Time:  Today, I have spent 10 minutes with the patient with telehealth technology discussing the above problems.     Medication Adjustments/Labs and Tests Ordered: Current medicines are reviewed at length with the patient today.  Concerns regarding medicines are outlined above.   Tests Ordered: No orders of the defined types were placed in this encounter.   Medication Changes: Meds ordered this encounter  Medications  . lisinopril (ZESTRIL) 40 MG tablet    Sig: TAKE 1 TABLET(40 MG) BY MOUTH DAILY    Dispense:  90 tablet    Refill:  3  . rosuvastatin (CRESTOR) 10 MG tablet    Sig: TAKE 1 TABLET(10 MG) BY MOUTH DAILY    Dispense:  90 tablet    Refill:  2  . amLODipine (NORVASC) 5 MG tablet    Sig: Take 1 tablet (5 mg total) by mouth daily.    Dispense:  180 tablet    Refill:  2    Follow Up:  In Person in 3 month(s)  Signed, Kathlyn Sacramento, MD  01/14/2019 4:04 PM    [facility]

## 2019-01-14 NOTE — Patient Instructions (Addendum)
Medication Instructions:  - Your physician has recommended you make the following change in your medication:   1) Start taking amlodipine 5 mg- 1 tablet by mouth once daily.    2) Continue other medications.  If you need a refill on your cardiac medications before your next appointment, please call your pharmacy.   Lab work: None If you have labs (blood work) drawn today and your tests are completely normal, you will receive your results only by: Marland Kitchen MyChart Message (if you have MyChart) OR . A paper copy in the mail If you have any lab test that is abnormal or we need to change your treatment, we will call you to review the results.  Testing/Procedures: None  Follow-Up: At [facility], you and your health needs are our priority.  As part of our continuing mission to provide you with exceptional heart care, we have created designated Provider Care Teams.  These Care Teams include your primary Cardiologist (physician) and Advanced Practice Providers (APPs -  Physician Assistants and Nurse Practitioners) who all work together to provide you with the care you need, when you need it.  You will need a follow up appointment in 3 months (September) Please call our office 2 months in advance to schedule this appointment. (call in late July to schedule)  You may see Kathlyn Sacramento, MD or one of the following Advanced Practice Providers on your designated Care Team:   Murray Hodgkins, NP Christell Faith, PA-C . Marrianne Mood, PA-C

## 2019-02-13 ENCOUNTER — Other Ambulatory Visit: Payer: Self-pay | Admitting: Physician Assistant

## 2019-03-15 ENCOUNTER — Other Ambulatory Visit: Payer: Self-pay | Admitting: Physician Assistant

## 2019-03-18 ENCOUNTER — Other Ambulatory Visit: Payer: Self-pay | Admitting: Cardiovascular Disease

## 2019-03-18 MED ORDER — AMLODIPINE BESYLATE 5 MG PO TABS: 5.0000 mg | ORAL_TABLET | Freq: Every day | ORAL | 3 refills | Status: DC

## 2019-03-18 NOTE — Telephone Encounter (Signed)
°*  STAT* If patient is at the pharmacy, call can be transferred to refill team.   1. Which medications need to be refilled? (please list name of each medication and dose if known) amlodipine 5 MG - 1 tablet daily   2. Which pharmacy/location (including street and city if local pharmacy) is medication to be sent to? [facility] in [place]  3. Do they need a 30 day or 90 day supply? 90 day

## 2019-05-16 ENCOUNTER — Other Ambulatory Visit: Payer: Self-pay

## 2019-05-16 MED ORDER — LISINOPRIL 40 MG PO TABS: 40.0000 mg | ORAL_TABLET | Freq: Every day | ORAL | 3 refills | Status: DC

## 2019-06-11 ENCOUNTER — Other Ambulatory Visit: Payer: Self-pay

## 2019-06-11 MED ORDER — ROSUVASTATIN CALCIUM 10 MG PO TABS: ORAL_TABLET | ORAL | 0 refills | Status: DC

## 2019-06-11 NOTE — Telephone Encounter (Signed)
*  STAT* If patient is at the pharmacy, call can be transferred to refill team.   1. Which medications need to be refilled? (please list name of each medication and dose if known) ALBUTEROL, AND CRESTOR  2. Which pharmacy/location (including street and city if local pharmacy) is medication to be sent to? [facility]  3. Do they need a 30 day or 90 day supply? [place]

## 2019-06-19 ENCOUNTER — Other Ambulatory Visit: Payer: Self-pay

## 2019-06-19 ENCOUNTER — Encounter: Payer: Self-pay | Admitting: Cardiovascular Disease

## 2019-06-19 ENCOUNTER — Telehealth (INDEPENDENT_AMBULATORY_CARE_PROVIDER_SITE_OTHER): Payer: Medicaid Other | Admitting: Cardiovascular Disease

## 2019-06-19 VITALS — BP 140/83 | HR 80 | Ht 74.0 in | Wt 225.0 lb

## 2019-06-19 DIAGNOSIS — Z720 Tobacco use: Secondary | ICD-10-CM

## 2019-06-19 DIAGNOSIS — I2510 Atherosclerotic heart disease of native coronary artery without angina pectoris: Secondary | ICD-10-CM

## 2019-06-19 DIAGNOSIS — I10 Essential (primary) hypertension: Secondary | ICD-10-CM

## 2019-06-19 DIAGNOSIS — E785 Hyperlipidemia, unspecified: Secondary | ICD-10-CM

## 2019-06-19 MED ORDER — ALBUTEROL SULFATE HFA 108 (90 BASE) MCG/ACT IN AERS: 1.0000 | INHALATION_SPRAY | Freq: Four times a day (QID) | RESPIRATORY_TRACT | 3 refills | Status: DC | PRN

## 2019-06-19 MED ORDER — AMLODIPINE BESYLATE 5 MG PO TABS: 5.0000 mg | ORAL_TABLET | Freq: Every day | ORAL | 3 refills | Status: DC

## 2019-06-19 NOTE — Patient Instructions (Signed)
Medication Instructions:  Continue same medications . *If you need a refill on your cardiac medications before your next appointment, please call your pharmacy*  Lab Work: None If you have labs (blood work) drawn today and your tests are completely normal, you will receive your results only by: . MyChart Message (if you have MyChart) OR . A paper copy in the mail If you have any lab test that is abnormal or we need to change your treatment, we will call you to review the results.  Testing/Procedures: None  Follow-Up: At [facility], you and your health needs are our priority.  As part of our continuing mission to provide you with exceptional heart care, we have created designated Provider Care Teams.  These Care Teams include your primary Cardiologist (physician) and Advanced Practice Providers (APPs -  Physician Assistants and Nurse Practitioners) who all work together to provide you with the care you need, when you need it.  Your next appointment:   6 month(s)  The format for your next appointment:   In Person  Provider:    You may see  , MD or one of the following Advanced Practice Providers on your designated Care Team:    Christopher Berge, NP  Ryan Dunn, PA-C  Jacquelyn Visser, PA-C    

## 2019-06-19 NOTE — Progress Notes (Signed)
Virtual Visit via Telephone Note   This visit type was conducted due to national recommendations for restrictions regarding the COVID-19 Pandemic (e.g. social distancing) in an effort to limit this patient's exposure and mitigate transmission in our community.  Due to his co-morbid illnesses, this patient is at least at moderate risk for complications without adequate follow up.  This format is felt to be most appropriate for this patient at this time.  The patient did not have access to video technology/had technical difficulties with video requiring transitioning to audio format only (telephone).  All issues noted in this document were discussed and addressed.  No physical exam could be performed with this format.  Please refer to the patient's chart for his  consent to telehealth for [facility].   Date:  06/19/2019   ID:  Bryan Ibarra., DOB 09-Jun-1956, MRN [id number]  Patient Location: Home Provider Location: Office  PCP:  Patient, No Pcp Per  Cardiologist:  Lorine Bears, MD  Electrophysiologist:  None   Evaluation Performed:  Follow-Up Visit  Chief Complaint: Elevated blood pressure and anxiety attacks.  History of Present Illness:    Bryan Ibarra. is a 63 y.o. male who was reached via phone for follow-up visit regarding coronary artery disease. He had non-ST elevation myocardial infarction in June 2018.  He underwent PCI and drug-eluting stent placement to the LAD without complications.  Ejection fraction was normal.  Other medical problems include hypertension, polysubstance abuse including tobacco, alcohol and marijuana and remote history of cocaine use. He did not tolerate high-dose atorvastatin due to myalgia.  He continues to smoke 1 pack/day.   His blood pressure was significantly elevated during last virtual visit and thus amlodipine 5 mg once daily was added.  Since then, his blood pressure has improved.  He has been doing well with no recent chest pain.  He  reports stable exertional dyspnea. Unfortunately, his brother is hospitalized in the ICU with COVID-19 and the whole family is infected.  The patient stays home most of the time .  The patient does not have symptoms concerning for COVID-19 infection (fever, chills, cough, or new shortness of breath).    Past Medical History:  Diagnosis Date  . Alcohol abuse    a. up to a 12 pack of beer/day.  . Arthritis   . CAD in native artery    a. LHC 6/18: severe 1 vessel CAD with 90% thrombotic stenosis in the proximal LAD which was felt to be the culprit for the patient's non-STEMI. He underwent successful PCI/DES with a resolute onyx drug-eluting stent (3.0 x 18 mm). There was 0% residual stenosis. LVEF estimated at 55-65% by visual estimate. LVEDP was mildly elevated  . H/O echocardiogram    a. echo 6/18: EF 60-65%, normal wall motion, grade 1 diastolic dysfunction, RV cavity size was normal with normal wall thickness and normal RV systolic function. There were no significant valvular abnormalities  . Hypertension   . Marijuana abuse    a. occasional.  . Tobacco abuse    Past Surgical History:  Procedure Laterality Date  . APPENDECTOMY    . CORONARY STENT INTERVENTION N/A 01/01/2017   Procedure: Coronary Stent Intervention;  Surgeon: Iran Ouch, MD;  Location: [facility] INVASIVE CV LAB;  Service: Cardiovascular;  Laterality: N/A;  . KNEE SURGERY    . LEFT HEART CATH AND CORONARY ANGIOGRAPHY N/A 01/01/2017   Procedure: Left Heart Cath and Coronary Angiography;  Surgeon: Iran Ouch, MD;  Location: [facility] INVASIVE CV LAB;  Service: Cardiovascular;  Laterality: N/A;     Current Meds  Medication Sig  . albuterol (PROVENTIL HFA;VENTOLIN HFA) 108 (90 Base) MCG/ACT inhaler Inhale 1-2 puffs into the lungs every 6 (six) hours as needed for wheezing or shortness of breath. Use with spacer.  Marland Kitchen. amLODipine (NORVASC) 5 MG tablet Take 1 tablet (5 mg total) by mouth daily.  Marland Kitchen. aspirin EC 81 MG tablet Take  1 tablet (81 mg total) by mouth daily.  . carvedilol (COREG) 6.25 MG tablet TAKE 1 TABLET(6.25 MG) BY MOUTH TWICE DAILY WITH A MEAL  . lisinopril (ZESTRIL) 40 MG tablet Take 1 tablet (40 mg total) by mouth daily.  . rosuvastatin (CRESTOR) 10 MG tablet TAKE 1 TABLET(10 MG) BY MOUTH DAILY     Allergies:   Patient has no known allergies.   Social History   Tobacco Use  . Smoking status: Current Every Day Smoker    Packs/day: 0.25    Types: Cigarettes  . Smokeless tobacco: Never Used  . Tobacco comment: smokes 2 cigarettes daily.   Substance Use Topics  . Alcohol use: Yes    Comment: 12 pack of beer most days of the week.  . Drug use: Yes    Types: Marijuana    Comment: occasional MJ.  Remote cocaine.     Family Hx: The patient's family history includes CAD in his father and paternal grandfather.  ROS:   Please see the history of present illness.     All other systems reviewed and are negative.   Prior CV studies:   The following studies were reviewed today:    Labs/Other Tests and Data Reviewed:    EKG:  No ECG reviewed.  Recent Labs: 06/24/2018: ALT 92; BUN 10; Creatinine, Ser 0.59; Hemoglobin 15.7; Platelets 166; Potassium 4.5; Sodium 141; TSH 1.330   Recent Lipid Panel Lab Results  Component Value Date/Time   CHOL 218 (H) 06/24/2018 05:25 PM   TRIG 62 06/24/2018 05:25 PM   HDL 111 06/24/2018 05:25 PM   CHOLHDL 2.0 06/24/2018 05:25 PM   LDLCALC 95 06/24/2018 05:25 PM   LDLDIRECT <4 06/24/2018 05:25 PM    Wt Readings from Last 3 Encounters:  06/19/19 225 lb (102.1 kg)  01/14/19 213 lb (96.6 kg)  06/24/18 196 lb 8 oz (89.1 kg)     Objective:    Vital Signs:  BP 140/83   Pulse 80   Ht 6\' 2"  (1.88 m)   Wt 225 lb (102.1 kg)   BMI 28.89 kg/m    VITAL SIGNS:  reviewed  ASSESSMENT & PLAN:     1.  Coronary artery disease involving native coronary arteries without angina: He is doing reasonably well overall with no anginal symptoms.  Continue aspirin  and rosuvastatin.  2.  Essential hypertension: Blood pressure improved with addition of amlodipine.  3.  Hyperlipidemia: He is tolerating small dose rosuvastatin.  He has history of elevated liver enzymes likely due to excessive alcohol use and thus been hesitant to increase rosuvastatin further.  The plan is to repeat lipid and liver profile during next visit  4.  Tobacco use: I discussed with him the importance of smoking cessation.    COVID-19 Education: The signs and symptoms of COVID-19 were discussed with the patient and how to seek care for testing (follow up with PCP or arrange E-visit).  The importance of social distancing was discussed today.  Time:   Today, I have spent 10 minutes with the  patient with telehealth technology discussing the above problems.     Medication Adjustments/Labs and Tests Ordered: Current medicines are reviewed at length with the patient today.  Concerns regarding medicines are outlined above.   Tests Ordered: No orders of the defined types were placed in this encounter.   Medication Changes: No orders of the defined types were placed in this encounter.   Follow Up:  In Person in 6 month(s)  Signed, Kathlyn Sacramento, MD  06/19/2019 8:31 AM    [place]

## 2019-06-20 ENCOUNTER — Telehealth: Payer: Self-pay | Admitting: Cardiovascular Disease

## 2019-06-20 NOTE — Telephone Encounter (Signed)
6 m fu per chkout 06/19/19 [facility]

## 2019-08-02 ENCOUNTER — Emergency Department: Payer: Medicaid Other | Admitting: Certified Registered"

## 2019-08-02 ENCOUNTER — Observation Stay: Payer: Medicaid Other

## 2019-08-02 ENCOUNTER — Encounter
Admission: EM | Disposition: A | Discharge status: Home / Self Care | Payer: Self-pay | Source: Home / Self Care | Attending: Emergency Medicine

## 2019-08-02 ENCOUNTER — Other Ambulatory Visit: Payer: Self-pay

## 2019-08-02 ENCOUNTER — Ambulatory Visit: Admit: 2019-08-02 | Payer: Medicaid Other

## 2019-08-02 ENCOUNTER — Encounter: Payer: Self-pay | Admitting: Internal Medicine

## 2019-08-02 ENCOUNTER — Observation Stay
Admission: EM | Admit: 2019-08-02 | Discharge: 2019-08-03 | Disposition: A | Discharge status: Home / Self Care | Payer: Medicaid Other | Attending: Family Medicine | Admitting: Family Medicine

## 2019-08-02 DIAGNOSIS — Z7982 Long term (current) use of aspirin: Secondary | ICD-10-CM | POA: Insufficient documentation

## 2019-08-02 DIAGNOSIS — Z955 Presence of coronary angioplasty implant and graft: Secondary | ICD-10-CM | POA: Diagnosis not present

## 2019-08-02 DIAGNOSIS — Z79899 Other long term (current) drug therapy: Secondary | ICD-10-CM | POA: Insufficient documentation

## 2019-08-02 DIAGNOSIS — R0602 Shortness of breath: Secondary | ICD-10-CM | POA: Diagnosis not present

## 2019-08-02 DIAGNOSIS — I252 Old myocardial infarction: Secondary | ICD-10-CM | POA: Diagnosis not present

## 2019-08-02 DIAGNOSIS — I10 Essential (primary) hypertension: Secondary | ICD-10-CM

## 2019-08-02 DIAGNOSIS — Z20822 Contact with and (suspected) exposure to covid-19: Secondary | ICD-10-CM | POA: Insufficient documentation

## 2019-08-02 DIAGNOSIS — I214 Non-ST elevation (NSTEMI) myocardial infarction: Secondary | ICD-10-CM | POA: Diagnosis not present

## 2019-08-02 DIAGNOSIS — R040 Epistaxis: Secondary | ICD-10-CM | POA: Diagnosis not present

## 2019-08-02 DIAGNOSIS — J9601 Acute respiratory failure with hypoxia: Secondary | ICD-10-CM

## 2019-08-02 DIAGNOSIS — F17210 Nicotine dependence, cigarettes, uncomplicated: Secondary | ICD-10-CM | POA: Diagnosis not present

## 2019-08-02 DIAGNOSIS — F1010 Alcohol abuse, uncomplicated: Secondary | ICD-10-CM | POA: Diagnosis present

## 2019-08-02 DIAGNOSIS — Z720 Tobacco use: Secondary | ICD-10-CM

## 2019-08-02 DIAGNOSIS — E785 Hyperlipidemia, unspecified: Secondary | ICD-10-CM | POA: Diagnosis not present

## 2019-08-02 DIAGNOSIS — F1020 Alcohol dependence, uncomplicated: Secondary | ICD-10-CM | POA: Insufficient documentation

## 2019-08-02 DIAGNOSIS — I2510 Atherosclerotic heart disease of native coronary artery without angina pectoris: Secondary | ICD-10-CM | POA: Diagnosis not present

## 2019-08-02 HISTORY — PX: NASAL ENDOSCOPY: SHX6577

## 2019-08-02 HISTORY — PX: NASAL HEMORRHAGE CONTROL: SHX287

## 2019-08-02 LAB — COMPREHENSIVE METABOLIC PANEL
ALT: 56 U/L — ABNORMAL HIGH (ref 0–44)
AST: 39 U/L (ref 15–41)
Albumin: 4.3 g/dL (ref 3.5–5.0)
Alkaline Phosphatase: 72 U/L (ref 38–126)
Anion gap: 13 (ref 5–15)
BUN: 19 mg/dL (ref 8–23)
CO2: 20 mmol/L — ABNORMAL LOW (ref 22–32)
Calcium: 9 mg/dL (ref 8.9–10.3)
Chloride: 106 mmol/L (ref 98–111)
Creatinine, Ser: 0.8 mg/dL (ref 0.61–1.24)
GFR calc Af Amer: >60 mL/min (ref >60)
GFR calc non Af Amer: >60 mL/min (ref >60)
Glucose, Bld: 94 mg/dL (ref 70–99)
Potassium: 4.4 mmol/L (ref 3.5–5.1)
Sodium: 139 mmol/L (ref 135–145)
Total Bilirubin: 0.9 mg/dL (ref 0.3–1.2)
Total Protein: 7.7 g/dL (ref 6.5–8.1)

## 2019-08-02 LAB — CBC WITH DIFFERENTIAL/PLATELET
Abs Immature Granulocytes: 0.02 10*3/uL (ref 0.00–0.07)
Basophils Absolute: 0.1 10*3/uL (ref 0.0–0.1)
Basophils Relative: 1 %
Eosinophils Absolute: 0.1 10*3/uL (ref 0.0–0.5)
Eosinophils Relative: 2 %
HCT: 45 % (ref 39.0–52.0)
Hemoglobin: 14.9 g/dL (ref 13.0–17.0)
Immature Granulocytes: 0 %
Lymphocytes Relative: 24 %
Lymphs Abs: 1.3 10*3/uL (ref 0.7–4.0)
MCH: 30.8 pg (ref 26.0–34.0)
MCHC: 33.1 g/dL (ref 30.0–36.0)
MCV: 93.2 fL (ref 80.0–100.0)
Monocytes Absolute: 0.5 10*3/uL (ref 0.1–1.0)
Monocytes Relative: 9 %
Neutro Abs: 3.6 10*3/uL (ref 1.7–7.7)
Neutrophils Relative %: 64 %
Platelets: 207 10*3/uL (ref 150–400)
RBC: 4.83 MIL/uL (ref 4.22–5.81)
RDW: 12.3 % (ref 11.5–15.5)
WBC: 5.6 10*3/uL (ref 4.0–10.5)
nRBC: 0 % (ref 0.0–0.2)

## 2019-08-02 LAB — CBC
HCT: 43.8 % (ref 39.0–52.0)
Hemoglobin: 14.6 g/dL (ref 13.0–17.0)
MCH: 31.2 pg (ref 26.0–34.0)
MCHC: 33.3 g/dL (ref 30.0–36.0)
MCV: 93.6 fL (ref 80.0–100.0)
Platelets: 211 10*3/uL (ref 150–400)
RBC: 4.68 MIL/uL (ref 4.22–5.81)
RDW: 12.3 % (ref 11.5–15.5)
WBC: 10 10*3/uL (ref 4.0–10.5)
nRBC: 0 % (ref 0.0–0.2)

## 2019-08-02 LAB — BRAIN NATRIURETIC PEPTIDE: B Natriuretic Peptide: 20 pg/mL (ref 0.0–100.0)

## 2019-08-02 LAB — PROTIME-INR
INR: 0.9 (ref 0.8–1.2)
Prothrombin Time: 12 seconds (ref 11.4–15.2)

## 2019-08-02 LAB — ETHANOL: Alcohol, Ethyl (B): 126 mg/dL — ABNORMAL HIGH (ref <10)

## 2019-08-02 LAB — POC SARS CORONAVIRUS 2 AG: SARS Coronavirus 2 Ag: NEGATIVE

## 2019-08-02 SURGERY — ENDOSCOPY, NOSE
Anesthesia: General | Site: Nose

## 2019-08-02 MED ORDER — DEXAMETHASONE SODIUM PHOSPHATE 10 MG/ML IJ SOLN
INTRAMUSCULAR | Status: DC | PRN
  Administered 2019-08-02: 10 mg via INTRAVENOUS

## 2019-08-02 MED ORDER — SODIUM CHLORIDE 0.9% FLUSH
3.0000 mL | Freq: Two times a day (BID) | INTRAVENOUS | Status: DC
  Administered 2019-08-02 – 2019-08-03 (×2): 3 mL via INTRAVENOUS

## 2019-08-02 MED ORDER — FOLIC ACID 1 MG PO TABS
1.0000 mg | ORAL_TABLET | Freq: Every day | ORAL | Status: DC
  Administered 2019-08-02 – 2019-08-03 (×2): 1 mg via ORAL
  Filled 2019-08-02 (×2): qty 1

## 2019-08-02 MED ORDER — ROSUVASTATIN CALCIUM 10 MG PO TABS
10.0000 mg | ORAL_TABLET | Freq: Every day | ORAL | Status: DC
  Administered 2019-08-02 – 2019-08-03 (×2): 10 mg via ORAL
  Filled 2019-08-02 (×2): qty 1

## 2019-08-02 MED ORDER — LIDOCAINE HCL (CARDIAC) PF 100 MG/5ML IV SOSY
PREFILLED_SYRINGE | INTRAVENOUS | Status: DC | PRN
  Administered 2019-08-02: 100 mg via INTRAVENOUS

## 2019-08-02 MED ORDER — ROCURONIUM BROMIDE 100 MG/10ML IV SOLN
INTRAVENOUS | Status: DC | PRN
  Administered 2019-08-02: 30 mg via INTRAVENOUS
  Administered 2019-08-02: 20 mg via INTRAVENOUS
  Administered 2019-08-02: 5 mg via INTRAVENOUS

## 2019-08-02 MED ORDER — ONDANSETRON HCL 4 MG/2ML IJ SOLN
4.0000 mg | Freq: Once | INTRAMUSCULAR | Status: AC
  Administered 2019-08-02: 4 mg via INTRAVENOUS
  Filled 2019-08-02: qty 2

## 2019-08-02 MED ORDER — SUCCINYLCHOLINE CHLORIDE 20 MG/ML IJ SOLN
INTRAMUSCULAR | Status: DC | PRN
  Administered 2019-08-02: 140 mg via INTRAVENOUS

## 2019-08-02 MED ORDER — IPRATROPIUM-ALBUTEROL 0.5-2.5 (3) MG/3ML IN SOLN
3.0000 mL | RESPIRATORY_TRACT | Status: DC
  Administered 2019-08-02: 3 mL via RESPIRATORY_TRACT

## 2019-08-02 MED ORDER — ONDANSETRON HCL 4 MG/2ML IJ SOLN: 4.0000 mg | Freq: Three times a day (TID) | INTRAMUSCULAR | Status: DC | PRN

## 2019-08-02 MED ORDER — HEMOSTATIC AGENTS (NO CHARGE) OPTIME
TOPICAL | Status: DC | PRN
  Administered 2019-08-02: 1

## 2019-08-02 MED ORDER — SUCCINYLCHOLINE CHLORIDE 20 MG/ML IJ SOLN
INTRAMUSCULAR | Status: AC
  Filled 2019-08-02: qty 1

## 2019-08-02 MED ORDER — DEXAMETHASONE SODIUM PHOSPHATE 10 MG/ML IJ SOLN
INTRAMUSCULAR | Status: AC
  Filled 2019-08-02: qty 1

## 2019-08-02 MED ORDER — THIAMINE HCL 100 MG PO TABS
100.0000 mg | ORAL_TABLET | Freq: Every day | ORAL | Status: DC
  Administered 2019-08-02 – 2019-08-03 (×2): 100 mg via ORAL
  Filled 2019-08-02 (×2): qty 1

## 2019-08-02 MED ORDER — LIDOCAINE HCL (PF) 2 % IJ SOLN
INTRAMUSCULAR | Status: AC
  Filled 2019-08-02: qty 10

## 2019-08-02 MED ORDER — MIDAZOLAM HCL 2 MG/2ML IJ SOLN
INTRAMUSCULAR | Status: AC
  Filled 2019-08-02: qty 2

## 2019-08-02 MED ORDER — LORAZEPAM 2 MG/ML IJ SOLN: 1.0000 mg | INTRAMUSCULAR | Status: DC | PRN

## 2019-08-02 MED ORDER — HYDRALAZINE HCL 25 MG PO TABS: 25.0000 mg | ORAL_TABLET | Freq: Three times a day (TID) | ORAL | Status: DC | PRN

## 2019-08-02 MED ORDER — OXYCODONE HCL 5 MG PO TABS: 5.0000 mg | ORAL_TABLET | Freq: Four times a day (QID) | ORAL | 0 refills | Status: DC | PRN

## 2019-08-02 MED ORDER — OXYCODONE-ACETAMINOPHEN 5-325 MG PO TABS
1.0000 | ORAL_TABLET | ORAL | Status: DC | PRN
  Administered 2019-08-02 (×2): 1 via ORAL
  Filled 2019-08-02 (×2): qty 1

## 2019-08-02 MED ORDER — LORAZEPAM 2 MG/ML IJ SOLN: 0.0000 mg | Freq: Four times a day (QID) | INTRAMUSCULAR | Status: DC

## 2019-08-02 MED ORDER — MORPHINE SULFATE (PF) 4 MG/ML IV SOLN
4.0000 mg | Freq: Once | INTRAVENOUS | Status: AC
  Administered 2019-08-02: 4 mg via INTRAVENOUS
  Filled 2019-08-02: qty 1

## 2019-08-02 MED ORDER — CARVEDILOL 3.125 MG PO TABS
6.2500 mg | ORAL_TABLET | Freq: Two times a day (BID) | ORAL | Status: DC
  Administered 2019-08-02 – 2019-08-03 (×2): 6.25 mg via ORAL
  Filled 2019-08-02 (×2): qty 2

## 2019-08-02 MED ORDER — ONDANSETRON HCL 4 MG/2ML IJ SOLN
INTRAMUSCULAR | Status: AC
  Filled 2019-08-02: qty 2

## 2019-08-02 MED ORDER — OXYMETAZOLINE HCL 0.05 % NA SOLN
1.0000 | Freq: Once | NASAL | Status: AC
  Administered 2019-08-02: 1 via NASAL

## 2019-08-02 MED ORDER — FENTANYL CITRATE (PF) 100 MCG/2ML IJ SOLN
INTRAMUSCULAR | Status: DC | PRN
  Administered 2019-08-02 (×2): 50 ug via INTRAVENOUS

## 2019-08-02 MED ORDER — NICOTINE 21 MG/24HR TD PT24
21.0000 mg | MEDICATED_PATCH | Freq: Every day | TRANSDERMAL | Status: DC
  Administered 2019-08-02 – 2019-08-03 (×2): 21 mg via TRANSDERMAL
  Filled 2019-08-02 (×2): qty 1

## 2019-08-02 MED ORDER — TRANEXAMIC ACID-NACL 1000-0.7 MG/100ML-% IV SOLN
1000.0000 mg | Freq: Once | INTRAVENOUS | Status: AC
  Administered 2019-08-02: 1000 mg via INTRAVENOUS
  Filled 2019-08-02: qty 100

## 2019-08-02 MED ORDER — ASPIRIN EC 81 MG PO TBEC: 81.0000 mg | DELAYED_RELEASE_TABLET | Freq: Every day | ORAL | 0 refills | Status: DC

## 2019-08-02 MED ORDER — PHENYLEPHRINE HCL (PRESSORS) 10 MG/ML IV SOLN
INTRAVENOUS | Status: DC | PRN
  Administered 2019-08-02: 200 ug via INTRAVENOUS
  Administered 2019-08-02 (×2): 100 ug via INTRAVENOUS

## 2019-08-02 MED ORDER — ROCURONIUM BROMIDE 50 MG/5ML IV SOLN
INTRAVENOUS | Status: AC
  Filled 2019-08-02: qty 1

## 2019-08-02 MED ORDER — LORAZEPAM 1 MG PO TABS: 1.0000 mg | ORAL_TABLET | ORAL | Status: DC | PRN

## 2019-08-02 MED ORDER — ONDANSETRON HCL 4 MG/2ML IJ SOLN
4.0000 mg | Freq: Once | INTRAMUSCULAR | Status: AC | PRN
  Administered 2019-08-02: 4 mg via INTRAVENOUS

## 2019-08-02 MED ORDER — PROPOFOL 10 MG/ML IV BOLUS
INTRAVENOUS | Status: AC
  Filled 2019-08-02: qty 40

## 2019-08-02 MED ORDER — ADULT MULTIVITAMIN W/MINERALS CH
1.0000 | ORAL_TABLET | Freq: Every day | ORAL | Status: DC
  Administered 2019-08-02 – 2019-08-03 (×2): 1 via ORAL
  Filled 2019-08-02 (×2): qty 1

## 2019-08-02 MED ORDER — SUGAMMADEX SODIUM 200 MG/2ML IV SOLN
INTRAVENOUS | Status: DC | PRN
  Administered 2019-08-02: 200 mg via INTRAVENOUS

## 2019-08-02 MED ORDER — THIAMINE HCL 100 MG/ML IJ SOLN: 100.0000 mg | Freq: Every day | INTRAMUSCULAR | Status: DC

## 2019-08-02 MED ORDER — ACETAMINOPHEN 325 MG PO TABS
650.0000 mg | ORAL_TABLET | Freq: Four times a day (QID) | ORAL | Status: DC | PRN
  Administered 2019-08-02: 650 mg via ORAL
  Filled 2019-08-02: qty 2

## 2019-08-02 MED ORDER — IPRATROPIUM BROMIDE HFA 17 MCG/ACT IN AERS
2.0000 | INHALATION_SPRAY | RESPIRATORY_TRACT | Status: DC
  Administered 2019-08-02 – 2019-08-03 (×5): 2 via RESPIRATORY_TRACT
  Filled 2019-08-02: qty 12.9

## 2019-08-02 MED ORDER — SUGAMMADEX SODIUM 200 MG/2ML IV SOLN
INTRAVENOUS | Status: AC
  Filled 2019-08-02: qty 2

## 2019-08-02 MED ORDER — PROPOFOL 10 MG/ML IV BOLUS
INTRAVENOUS | Status: DC | PRN
  Administered 2019-08-02: 200 mg via INTRAVENOUS

## 2019-08-02 MED ORDER — AMLODIPINE BESYLATE 5 MG PO TABS
5.0000 mg | ORAL_TABLET | Freq: Every day | ORAL | Status: DC
  Administered 2019-08-02 – 2019-08-03 (×2): 5 mg via ORAL
  Filled 2019-08-02 (×2): qty 1

## 2019-08-02 MED ORDER — OXYMETAZOLINE HCL 0.05 % NA SOLN
NASAL | Status: DC | PRN
  Administered 2019-08-02: 1

## 2019-08-02 MED ORDER — IPRATROPIUM-ALBUTEROL 0.5-2.5 (3) MG/3ML IN SOLN
RESPIRATORY_TRACT | Status: AC
  Filled 2019-08-02: qty 3

## 2019-08-02 MED ORDER — LISINOPRIL 20 MG PO TABS
40.0000 mg | ORAL_TABLET | Freq: Every day | ORAL | Status: DC
  Administered 2019-08-02 – 2019-08-03 (×2): 40 mg via ORAL
  Filled 2019-08-02 (×2): qty 2

## 2019-08-02 MED ORDER — DM-GUAIFENESIN ER 30-600 MG PO TB12
1.0000 | ORAL_TABLET | Freq: Two times a day (BID) | ORAL | Status: DC
  Administered 2019-08-02 – 2019-08-03 (×3): 1 via ORAL
  Filled 2019-08-02 (×5): qty 1

## 2019-08-02 MED ORDER — LACTATED RINGERS IV SOLN: INTRAVENOUS | Status: DC | PRN

## 2019-08-02 MED ORDER — ALBUTEROL SULFATE HFA 108 (90 BASE) MCG/ACT IN AERS
2.0000 | INHALATION_SPRAY | RESPIRATORY_TRACT | Status: DC | PRN
  Filled 2019-08-02: qty 6.7

## 2019-08-02 MED ORDER — LORAZEPAM 2 MG/ML IJ SOLN: 0.0000 mg | Freq: Two times a day (BID) | INTRAMUSCULAR | Status: DC

## 2019-08-02 MED ORDER — MIDAZOLAM HCL 2 MG/2ML IJ SOLN
INTRAMUSCULAR | Status: DC | PRN
  Administered 2019-08-02: 2 mg via INTRAVENOUS

## 2019-08-02 MED ORDER — FENTANYL CITRATE (PF) 100 MCG/2ML IJ SOLN: 25.0000 ug | INTRAMUSCULAR | Status: DC | PRN

## 2019-08-02 MED ORDER — ONDANSETRON HCL 4 MG/2ML IJ SOLN
INTRAMUSCULAR | Status: DC | PRN
  Administered 2019-08-02: 4 mg via INTRAVENOUS

## 2019-08-02 MED ORDER — FENTANYL CITRATE (PF) 100 MCG/2ML IJ SOLN
INTRAMUSCULAR | Status: AC
  Filled 2019-08-02: qty 2

## 2019-08-02 SURGICAL SUPPLY — 35 items
BASIN GRAD PLASTIC 32OZ STRL (MISCELLANEOUS) ×2 IMPLANT
BATTERY INSTRU NAVIGATION (MISCELLANEOUS) ×6 IMPLANT
CANISTER SUCT 1200ML W/VALVE (MISCELLANEOUS) ×2 IMPLANT
CATH ROBINSON RED A/P 8FR (CATHETERS) ×2 IMPLANT
CNTNR SPEC 2.5X3XGRAD LEK (MISCELLANEOUS) ×2
COAG SUCT 10F 3.5MM HAND CTRL (MISCELLANEOUS) ×2 IMPLANT
CONT SPEC 4OZ STER OR WHT (MISCELLANEOUS) ×2
CONTAINER SPEC 2.5X3XGRAD LEK (MISCELLANEOUS) ×2 IMPLANT
COVER WAND RF STERILE (DRAPES) ×2 IMPLANT
CUP MEDICINE 2OZ PLAST GRAD ST (MISCELLANEOUS) ×4 IMPLANT
DEFOGGER ANTIFOG KIT (MISCELLANEOUS) ×2 IMPLANT
DRESSING NASL FOAM PST OP SINU (MISCELLANEOUS) ×2 IMPLANT
DRSG NASAL FOAM POST OP SINU (MISCELLANEOUS) ×4
ELECT REM PT RETURN 9FT ADLT (ELECTROSURGICAL) ×2
ELECTRODE REM PT RTRN 9FT ADLT (ELECTROSURGICAL) ×1 IMPLANT
GAUZE SPONGE 4X4 12PLY STRL (GAUZE/BANDAGES/DRESSINGS) ×2 IMPLANT
GLOVE BIO SURGEON STRL SZ7.5 (GLOVE) ×4 IMPLANT
GOWN STRL REUS W/ TWL LRG LVL3 (GOWN DISPOSABLE) ×2 IMPLANT
GOWN STRL REUS W/TWL LRG LVL3 (GOWN DISPOSABLE) ×2
HEMOSTAT SURGICEL 2X3 (HEMOSTASIS) ×2 IMPLANT
LABEL OR SOLS (LABEL) ×2 IMPLANT
NEEDLE HYPO 25GX1X1/2 BEV (NEEDLE) ×2 IMPLANT
NS IRRIG 500ML POUR BTL (IV SOLUTION) ×2 IMPLANT
PACK HEAD/NECK (MISCELLANEOUS) ×2 IMPLANT
PACKING NASAL EPIS 4X2.4 XEROG (MISCELLANEOUS) ×2 IMPLANT
SOL ANTI-FOG 6CC FOG-OUT (MISCELLANEOUS) ×1 IMPLANT
SOL FOG-OUT ANTI-FOG 6CC (MISCELLANEOUS) ×1
SPLINT NASAL REUTER .5MM BIVLV (MISCELLANEOUS) ×2 IMPLANT
SPONGE NEURO XRAY DETECT 1X3 (DISPOSABLE) ×2 IMPLANT
SWAB CULTURE AMIES ANAERIB BLU (MISCELLANEOUS) ×2 IMPLANT
SYR 10ML LL (SYRINGE) ×2 IMPLANT
SYR BULB IRRIG 60ML STRL (SYRINGE) ×2 IMPLANT
TRACKER CRANIALMASK (MASK) ×2 IMPLANT
TUBING CONNECTING 10 (TUBING) ×2 IMPLANT
WATER STERILE IRR 1000ML POUR (IV SOLUTION) ×2 IMPLANT

## 2019-08-02 NOTE — Transfer of Care (Signed)
Immediate Anesthesia Transfer of Care Note  Patient: Bryan Ibarra.  Procedure(s) Performed: NASAL ENDOSCOPY (N/A Nose) EPISTAXIS CONTROL (N/A Nose)  Patient Location: PACU  Anesthesia Type:General  Level of Consciousness: awake, alert  and oriented  Airway & Oxygen Therapy: Patient Spontanous Breathing and Patient connected to face mask oxygen  Post-op Assessment: Report given to RN and Post -op Vital signs reviewed and stable  Post vital signs: Reviewed and stable  Last Vitals:  Vitals Value Taken Time  BP    Temp    Pulse 98 08/02/19 1313  Resp 19 08/02/19 1313  SpO2 100 % 08/02/19 1313  Vitals shown include unvalidated device data.  Last Pain:  Vitals:   08/02/19 0612  TempSrc: Oral  PainSc:          Complications: No apparent anesthesia complications

## 2019-08-02 NOTE — ED Notes (Signed)
Bleeding continues but has slowed. Pressure remains intact.

## 2019-08-02 NOTE — Progress Notes (Signed)
Incentive spirometer given to patient and instructed how to use it.  Patient verbalized understanding.

## 2019-08-02 NOTE — ED Provider Notes (Signed)
[facility] Emergency Department Provider Note       Time seen: ----------------------------------------- 7:00 AM on 08/02/2019 ----------------------------------------- I have reviewed the triage vital signs and the nursing notes.  HISTORY   Chief Complaint Epistaxis   HPI Bryan Ibarra. is a 64 y.o. male with a history of alcohol abuse, arthritis, coronary artery disease, hypertension, marijuana abuse who presents to the ED for epistaxis.  Patient reports nosebleeds that started approximately an hour prior to arrival.  Patient states he was smoking a cigarette when this occurred.  He had hemorrhage from the right side and subsequently from the left side that he states he went down his throat.  He has never had his have before.  He does take aspirin.  Past Medical History:  Diagnosis Date  . Alcohol abuse    a. up to a 12 pack of beer/day.  . Arthritis   . CAD in native artery    a. LHC 6/18: severe 1 vessel CAD with 90% thrombotic stenosis in the proximal LAD which was felt to be the culprit for the patient's non-STEMI. He underwent successful PCI/DES with a resolute onyx drug-eluting stent (3.0 x 18 mm). There was 0% residual stenosis. LVEF estimated at 55-65% by visual estimate. LVEDP was mildly elevated  . H/O echocardiogram    a. echo 6/18: EF 60-65%, normal wall motion, grade 1 diastolic dysfunction, RV cavity size was normal with normal wall thickness and normal RV systolic function. There were no significant valvular abnormalities  . Hypertension   . Marijuana abuse    a. occasional.  . Tobacco abuse     Patient Active Problem List   Diagnosis Date Noted  . NSTEMI (non-ST elevated myocardial infarction) ([place]) 01/01/2017    Past Surgical History:  Procedure Laterality Date  . APPENDECTOMY    . CORONARY STENT INTERVENTION N/A 01/01/2017   Procedure: Coronary Stent Intervention;  Surgeon: Wellington Hampshire, MD;  Location: [place] CV  LAB;  Service: Cardiovascular;  Laterality: N/A;  . KNEE SURGERY    . LEFT HEART CATH AND CORONARY ANGIOGRAPHY N/A 01/01/2017   Procedure: Left Heart Cath and Coronary Angiography;  Surgeon: Wellington Hampshire, MD;  Location: [place] [facility] LAB;  Service: Cardiovascular;  Laterality: N/A;    Allergies Patient has no known allergies.  Social History Social History   Tobacco Use  . Smoking status: Current Every Day Smoker    Packs/day: 0.25    Types: Cigarettes  . Smokeless tobacco: Never Used  . Tobacco comment: smokes 2 cigarettes daily.   Substance Use Topics  . Alcohol use: Yes    Comment: 12 pack of beer most days of the week.  . Drug use: Yes    Types: Marijuana    Comment: occasional MJ.  Remote cocaine.   Review of Systems Constitutional: Negative for fever. HEENT: Positive for epistaxis Cardiovascular: Negative for chest pain. Respiratory: Negative for shortness of breath. Gastrointestinal: Negative for abdominal pain, vomiting and diarrhea. Musculoskeletal: Negative for back pain. Skin: Negative for rash. Neurological: Negative for headaches, focal weakness or numbness.  All systems negative/normal/unremarkable except as stated in the HPI  ____________________________________________   PHYSICAL EXAM:  VITAL SIGNS: ED Triage Vitals  Enc Vitals Group     BP 08/02/19 0612 124/81     Pulse Rate 08/02/19 0612 91     Resp 08/02/19 0612 18     Temp 08/02/19 0612 97.9 F (36.6 C)     Temp Source 08/02/19 0612  Oral     SpO2 08/02/19 0612 95 %     Weight 08/02/19 0611 225 lb (102.1 kg)     Height 08/02/19 0611 6\' 2"  (1.88 m)     Head Circumference --      Peak Flow --      Pain Score 08/02/19 0611 0     Pain Loc --      Pain Edu? --      Excl. in GC? --    Constitutional: Alert and oriented. Well appearing and in no distress.  Patient smells heavily of alcohol Eyes: Conjunctivae are normal. Normal extraocular movements. ENT      Head: Normocephalic and  atraumatic.      Nose: No active hemorrhage at this time, there is recent bleeding in both naris      Mouth/Throat: Mucous membranes are moist.  Dried blood in the back of his throat      Neck: No stridor. Cardiovascular: Normal rate, regular rhythm. No murmurs, rubs, or gallops. Respiratory: Normal respiratory effort without tachypnea nor retractions. Breath sounds are clear and equal bilaterally. No wheezes/rales/rhonchi. Musculoskeletal: Nontender with normal range of motion in extremities. No lower extremity tenderness nor edema. Neurologic:  Normal speech and language. No gross focal neurologic deficits are appreciated.  Skin:  Skin is warm, dry and intact. No rash noted. Psychiatric: Mood and affect are normal. Speech and behavior are normal.  ____________________________________________  ED COURSE:  As part of my medical decision making, I reviewed the following data within the electronic MEDICAL RECORD NUMBER History obtained from family if available, nursing notes, old chart and ekg, as well as notes from prior ED visits. Patient presented for epistaxis, we will assess with labs and imaging as indicated at this time. Clinical Course as of Aug 02 951  Sat Aug 02, 2019  0741 Alcohol, Ethyl (B)(!): 126 [JW]    Clinical Course User Index [JW] 0742, MD   .Epistaxis Management  Date/Time: 08/02/2019 7:16 AM Performed by: 09/30/2019, MD Authorized by: Emily Filbert, MD   Consent:    Consent obtained:  Emergent situation   Consent given by:  Patient Anesthesia (see MAR for exact dosages):    Anesthesia method:  None Procedure details:    Treatment site:  L anterior and R anterior   Treatment method:  Anterior pack and merocel sponge   Treatment complexity:  Extensive   Treatment episode: recurring   Post-procedure details:    Assessment:  Bleeding decreased   Patient tolerance of procedure:  Tolerated with difficulty  Patient was evaluated by ENT  and did not find any bleeding sites, Surgicel was placed on the right side.  Subsequently the right side was packed with Merocel again because he began to hemorrhage.  Bryan Ibarra. was evaluated in Emergency Department on 08/02/2019 for the symptoms described in the history of present illness. He was evaluated in the context of the global COVID-19 pandemic, which necessitated consideration that the patient might be at risk for infection with the SARS-CoV-2 virus that causes COVID-19. Institutional protocols and algorithms that pertain to the evaluation of patients at risk for COVID-19 are in a state of rapid change based on information released by regulatory bodies including the CDC and federal and state organizations. These policies and algorithms were followed during the patient's care in the ED.  ____________________________________________   LABS (pertinent positives/negatives)  Labs Reviewed  COMPREHENSIVE METABOLIC PANEL - Abnormal; Notable for the following components:  Result Value   CO2 20 (*)    ALT 56 (*)    All other components within normal limits  ETHANOL - Abnormal; Notable for the following components:   Alcohol, Ethyl (B) 126 (*)    All other components within normal limits  CBC WITH DIFFERENTIAL/PLATELET  PROTIME-INR  POC SARS CORONAVIRUS 2 AG -  ED  POC SARS CORONAVIRUS 2 AG  ____________________________________________   CRITICAL CARE Performed by: Ulice Dash   Total critical care time: 30 minutes  Critical care time was exclusive of separately billable procedures and treating other patients.  Critical care was necessary to treat or prevent imminent or life-threatening deterioration.  Critical care was time spent personally by me on the following activities: development of treatment plan with patient and/or surrogate as well as nursing, discussions with consultants, evaluation of patient's response to treatment, examination of patient,  obtaining history from patient or surrogate, ordering and performing treatments and interventions, ordering and review of laboratory studies, ordering and review of radiographic studies, pulse oximetry and re-evaluation of patient's condition.   DIFFERENTIAL DIAGNOSIS   Epistaxis, coagulopathy, intoxication, anemia, drug abuse  FINAL ASSESSMENT AND PLAN  Epistaxis, alcohol intoxication   Plan: The patient had presented for epistaxis. Patient's labs were initially unremarkable, we will recheck his CBC.  Patient had extensive epistaxis that seemingly resolved after packing both sides.  ENT subsequently remove the packing and did not find a bleeding source.  He was observed for a time.  And did well but then had subsequent hemorrhage from the right side.  I have repacked the right side with Merocel and have reconsulted ENT for operative fixation.   Ulice Dash, MD    Note: This note was generated in part or whole with voice recognition software. Voice recognition is usually quite accurate but there are transcription errors that can and very often do occur. I apologize for any typographical errors that were not detected and corrected.     Emily Filbert, MD 08/02/19 [phone]

## 2019-08-02 NOTE — H&P (Signed)
History and Physical    Bryan Ibarra. TJQ:[id number] DOB: Apr 30, 1956 DOA: 08/02/2019  Referring MD/NP/PA:   PCP: Patient, No Pcp Per   Patient coming from:  The patient is coming from home.  At baseline, pt is independent for most of ADL.        Chief Complaint:  epistaxis  HPI: Bryan Ibarra. is a 64 y.o. male with medical history significant of hypertension, hyperlipidemia, alcohol abuse, tobacco abuse, CAD with DES stent placement 12/2016, who presents with epistaxis.  Pt states that he started having right sided epistaxis this morning while he was on his porch smoking a cigarette. He was found to have right sided epistaxis which did not resolve with afrin in ED. ENT was consulted. Per Dr. Wells Guiles, " A right sided merocel was placed and ultimately a left sided rhinorocket, followed by another merocel on the left for persistent bleeding". Finally, pt underwent nasal endoscopy and arterial bleeder from right basal lamella of middle turbinate was cauterized with suction bovie. his bleeding stopped.after the procedure, pt was found to have oxygen desaturation to 89% on room air.  Patient states that he has cough with clear mucus production and shortness of breath which has been going on for a while.  Denies chest pain, fever, but has chills.  Patient does not have nausea, vomiting, diarrhea, abdominal pain, symptoms of UTI or unilateral weakness.  ED Course: pt was found to have hemoglobin 14.6, WBC 10.0, INR 0.9, negative COVID-19 Ag, alcohol level 126, electrolytes renal function okay, temperature normal, blood pressure 151/97, oxygen saturation 95% on 3 L oxygen, tachycardia, RR 18.  Chest x-ray showed vascular congestion, no infiltration.  Patient is placed on MedSurg bed for observation.  Review of Systems:   General: no fevers, has chills, no body weight gain, has fatigue and nose bleeding. HEENT: no blurry vision, hearing changes or sore throat Respiratory: has dyspnea, coughing,  no wheezing CV: no chest pain, no palpitations GI: no nausea, vomiting, abdominal pain, diarrhea, constipation GU: no dysuria, burning on urination, increased urinary frequency, hematuria  Ext: no leg edema Neuro: no unilateral weakness, numbness, or tingling, no vision change or hearing loss Skin: no rash, no skin tear. MSK: No muscle spasm, no deformity, no limitation of range of movement in spin Heme: No easy bruising.  Travel history: No recent long distant travel.  Allergy: No Known Allergies  Past Medical History:  Diagnosis Date  . Alcohol abuse    a. up to a 12 pack of beer/day.  . Arthritis   . CAD in native artery    a. LHC 6/18: severe 1 vessel CAD with 90% thrombotic stenosis in the proximal LAD which was felt to be the culprit for the patient's non-STEMI. He underwent successful PCI/DES with a resolute onyx drug-eluting stent (3.0 x 18 mm). There was 0% residual stenosis. LVEF estimated at 55-65% by visual estimate. LVEDP was mildly elevated  . H/O echocardiogram    a. echo 6/18: EF 60-65%, normal wall motion, grade 1 diastolic dysfunction, RV cavity size was normal with normal wall thickness and normal RV systolic function. There were no significant valvular abnormalities  . Hypertension   . Marijuana abuse    a. occasional.  . Tobacco abuse     Past Surgical History:  Procedure Laterality Date  . APPENDECTOMY    . CORONARY STENT INTERVENTION N/A 01/01/2017   Procedure: Coronary Stent Intervention;  Surgeon: Iran Ouch, MD;  Location: [facility];  Service: Cardiovascular;  Laterality: N/A;  . KNEE SURGERY    . LEFT HEART CATH AND CORONARY ANGIOGRAPHY N/A 01/01/2017   Procedure: Left Heart Cath and Coronary Angiography;  Surgeon: Wellington Hampshire, MD;  Location: [place] [facility];  Service: Cardiovascular;  Laterality: N/A;    Social History:  reports that he has been smoking cigarettes. He has been smoking about 0.25 packs per day. He has never  used smokeless tobacco. He reports current alcohol use. He reports current drug use. Drug: Marijuana.  Family History:  Family History  Problem Relation Age of Onset  . CAD Father        s/p CABG in his 10's or 56's.  Marland Kitchen CAD Paternal Grandfather      Prior to Admission medications   Medication Sig Start Date End Date Taking? Authorizing Provider  albuterol (VENTOLIN HFA) 108 (90 Base) MCG/ACT inhaler Inhale 1-2 puffs into the lungs every 6 (six) hours as needed for wheezing or shortness of breath. Use with spacer. 06/19/19  Yes Wellington Hampshire, MD  amLODipine (NORVASC) 5 MG tablet Take 1 tablet (5 mg total) by mouth daily. 06/19/19 09/17/19 Yes Wellington Hampshire, MD  carvedilol (COREG) 6.25 MG tablet TAKE 1 TABLET(6.25 MG) BY MOUTH TWICE DAILY WITH A MEAL Patient taking differently: Take 6.25 mg by mouth 2 (two) times daily with a meal. TAKE 1 TABLET(6.25 MG) BY MOUTH TWICE DAILY WITH A MEAL 06/24/18  Yes Dunn, Ryan M, PA-C  lisinopril (ZESTRIL) 40 MG tablet Take 1 tablet (40 mg total) by mouth daily. 05/16/19  Yes Wellington Hampshire, MD  rosuvastatin (CRESTOR) 10 MG tablet TAKE 1 TABLET(10 MG) BY MOUTH DAILY Patient taking differently: Take 10 mg by mouth daily. TAKE 1 TABLET(10 MG) BY MOUTH DAILY 06/11/19  Yes Wellington Hampshire, MD  aspirin EC 81 MG tablet Take 1 tablet (81 mg total) by mouth daily. Hold for 3 days. 08/02/19   Ackall, Marjo Bicker, MD  oxyCODONE (ROXICODONE) 5 MG immediate release tablet Take 1 tablet (5 mg total) by mouth every 6 (six) hours as needed for severe pain. 08/02/19 08/01/20  Gaye Alken, MD    Physical Exam: Vitals:   08/02/19 1430 08/02/19 1515 08/02/19 1608 08/02/19 1735  BP: (!) 151/97 136/85 (!) 173/99 (!) 148/91  Pulse: 100 99 (!) 101 95  Resp:   18 (!) 22  Temp:   97.6 F (36.4 C) 97.6 F (36.4 C)  TempSrc:   Oral Oral  SpO2: 100% 93% 97% 96%  Weight:   104.3 kg   Height:   6\' 2"  (1.88 m)    General: Not in acute distress HEENT:       Eyes: PERRL,  EOMI, no scleral icterus.       ENT: No discharge from the ears and nose, no pharynx injection, no tonsillar enlargement.        Neck: No JVD, no bruit, no mass felt. Heme: No neck lymph node enlargement. Cardiac: S1/S2, RRR, No murmurs, No gallops or rubs. Respiratory: No rales, wheezing, rhonchi or rubs. GI: Soft, nondistended, nontender, no rebound pain, no organomegaly, BS present. GU: No hematuria Ext: No pitting leg edema bilaterally. 2+DP/PT pulse bilaterally. Musculoskeletal: No joint deformities, No joint redness or warmth, no limitation of ROM in spin. Skin: No rashes.  Neuro: Alert, oriented X3, cranial nerves II-XII grossly intact, moves all extremities normally.  Psych: Patient is not psychotic, no suicidal or hemocidal ideation.  Labs on Admission: I have personally reviewed  following labs and imaging studies  CBC: Recent Labs  Lab 08/02/19 0703 08/02/19 1002  WBC 5.6 10.0  NEUTROABS 3.6  --   HGB 14.9 14.6  HCT 45.0 43.8  MCV 93.2 93.6  PLT 207 211   Basic Metabolic Panel: Recent Labs  Lab 08/02/19 0703  NA 139  K 4.4  CL 106  CO2 20*  GLUCOSE 94  BUN 19  CREATININE 0.80  CALCIUM 9.0   GFR: Estimated Creatinine Clearance: 123.2 mL/min (by C-G formula based on SCr of 0.8 mg/dL). Liver Function Tests: Recent Labs  Lab 08/02/19 0703  AST 39  ALT 56*  ALKPHOS 72  BILITOT 0.9  PROT 7.7  ALBUMIN 4.3   No results for input(s): LIPASE, AMYLASE in the last 168 hours. No results for input(s): AMMONIA in the last 168 hours. Coagulation Profile: Recent Labs  Lab 08/02/19 0703  INR 0.9   Cardiac Enzymes: No results for input(s): CKTOTAL, CKMB, CKMBINDEX, TROPONINI in the last 168 hours. BNP (last 3 results) No results for input(s): PROBNP in the last 8760 hours. HbA1C: No results for input(s): HGBA1C in the last 72 hours. CBG: No results for input(s): GLUCAP in the last 168 hours. Lipid Profile: No results for input(s): CHOL, HDL, LDLCALC,  TRIG, CHOLHDL, LDLDIRECT in the last 72 hours. Thyroid Function Tests: No results for input(s): TSH, T4TOTAL, FREET4, T3FREE, THYROIDAB in the last 72 hours. Anemia Panel: No results for input(s): VITAMINB12, FOLATE, FERRITIN, TIBC, IRON, RETICCTPCT in the last 72 hours. Urine analysis: No results found for: COLORURINE, APPEARANCEUR, LABSPEC, PHURINE, GLUCOSEU, HGBUR, BILIRUBINUR, KETONESUR, PROTEINUR, UROBILINOGEN, NITRITE, LEUKOCYTESUR Sepsis Labs: @LABRCNTIP (procalcitonin:4,lacticidven:4) )No results found for this or any previous visit (from the past 240 hour(s)).   Radiological Exams on Admission: Portable CXR  Result Date: 08/02/2019 CLINICAL DATA:  Shortness of breath. EXAM: PORTABLE CHEST 1 VIEW COMPARISON:  04/07/2018 FINDINGS: The heart size appears within normal limits. No pleural effusion or edema identified. Pulmonary vascular congestion. No airspace consolidation. The visualized osseous structures are unremarkable. IMPRESSION: 1. No acute cardiopulmonary abnormalities. 2. Pulmonary vascular congestion. Electronically Signed   By: 06/07/2018 M.D.   On: 08/02/2019 15:27     EKG:   Not done in ED, will get one.   Assessment/Plan Principal Problem:   Acute respiratory failure with hypoxia (HCC) Active Problems:   Hypertension   Tobacco abuse   CAD in native artery   HLD (hyperlipidemia)   Epistaxis   Alcohol abuse   Acute respiratory failure with hypoxia [facility]): Etiology is not clear.  Patient has a long history of smoking, possibly has undiagnosed COPD.  Chest x-ray showed  vascular congestion, but pt does not have leg edema, low suspicion for CHF. -place on med-surg bed for obs -Nebulizers: scheduled Duoneb and prn albuterol -Inhaler: Atrovent inhaler, prn Albuterol inhaler -Mucinex for cough  -Incentive spirometry -Nasal cannula oxygen as needed to maintain O2 saturation 93% or greater -check BNP  HTN:  -Continue home medications: Amlodipine, Coreg,  lisinopril -hydralazine prn  Tobacco abuse and Alcohol abuse: -Did counseling about importance of quitting smoking -Nicotine patch -Did counseling about the importance of quitting drinking -CIWA protocol  CAD in native artery: s/p of DES. No CP. -hold ASA due to epistaxis -Continue Crestor  Epistaxis: stopped after cauterization -hold ASA  HLD: -Crestor   DVT ppx: SCD Code Status: Full code Family Communication: None at bed side.      Disposition Plan:  Anticipate discharge back to previous home environment Consults called:  none Admission status: Med-surg bed for obs  Date of Service 08/02/2019    Lorretta Harp [facility]   If 7PM-7AM, please contact night-coverage [email or web address] Password [facility] 08/02/2019, 6:48 PM

## 2019-08-02 NOTE — Consult Note (Addendum)
Bryan, Ibarra [id number] Sep 04, 1955 Bryan Filbert, MD  Reason for Consult: Epistaxis  HPI:   Bryan Ibarra. is a 64 y.o. male with history of CAD s/p prior stent on ASA who presents with right sided epistaxis which began this morning while he was on his porch smoking a cigarette. Bleeding did not stop and he presented to the ED. He was found to have right sided epistaxis which did not resolve with afrin. A right sided merocel was placed and ultimately a left sided rhinorocket, followed by another merocel on the left for persistent bleeding. Bryan Ibarra denies prior hx of epistaxis. He denies prior hx of nasal obstruction.  CBC is unremarkable. INR is normal. Per patient and Dr. Mayford Knife, bleeding was significant.   Allergies: No Known Allergies  ROS: Review of systems normal other than 12 systems except per HPI.  PMH:  Past Medical History:  Diagnosis Date  . Alcohol abuse    a. up to a 12 pack of beer/day.  . Arthritis   . CAD in native artery    a. LHC 6/18: severe 1 vessel CAD with 90% thrombotic stenosis in the proximal LAD which was felt to be the culprit for the patient's non-STEMI. He underwent successful PCI/DES with a resolute onyx drug-eluting stent (3.0 x 18 mm). There was 0% residual stenosis. LVEF estimated at 55-65% by visual estimate. LVEDP was mildly elevated  . H/O echocardiogram    a. echo 6/18: EF 60-65%, normal wall motion, grade 1 diastolic dysfunction, RV cavity size was normal with normal wall thickness and normal RV systolic function. There were no significant valvular abnormalities  . Hypertension   . Marijuana abuse    a. occasional.  . Tobacco abuse     FH:  Family History  Problem Relation Age of Onset  . CAD Father        s/p CABG in his 17's or 47's.  Marland Kitchen CAD Paternal Grandfather     SH:  Social History   Socioeconomic History  . Marital status: Divorced    Spouse name: Not on file  . Number of children: Not on file  . Years of  education: Not on file  . Highest education level: Not on file  Occupational History  . Not on file  Tobacco Use  . Smoking status: Current Every Day Smoker    Packs/day: 0.25    Types: Cigarettes  . Smokeless tobacco: Never Used  . Tobacco comment: smokes 2 cigarettes daily.   Substance and Sexual Activity  . Alcohol use: Yes    Comment: 12 pack of beer most days of the week.  . Drug use: Yes    Types: Marijuana    Comment: occasional MJ.  Remote cocaine.  . Sexual activity: Yes  Other Topics Concern  . Not on file  Social History Narrative   Lives in [place].  He lives in a mobile home in the backyard of his girlfriend.  He works in Aeronautical engineer.   Social Determinants of Health   Financial Resource Strain:   . Difficulty of Paying Living Expenses: Not on file  Food Insecurity:   . Worried About Programme researcher, broadcasting/film/video in the Last Year: Not on file  . Ran Out of Food in the Last Year: Not on file  Transportation Needs:   . Lack of Transportation (Medical): Not on file  . Lack of Transportation (Non-Medical): Not on file  Physical Activity:   . Days of Exercise per Week: Not on  file  . Minutes of Exercise per Session: Not on file  Stress:   . Feeling of Stress : Not on file  Social Connections:   . Frequency of Communication with Friends and Family: Not on file  . Frequency of Social Gatherings with Friends and Family: Not on file  . Attends Religious Services: Not on file  . Active Member of Clubs or Organizations: Not on file  . Attends Archivist Meetings: Not on file  . Marital Status: Not on file  Intimate Partner Violence:   . Fear of Current or Ex-Partner: Not on file  . Emotionally Abused: Not on file  . Physically Abused: Not on file  . Sexually Abused: Not on file    PSH:  Past Surgical History:  Procedure Laterality Date  . APPENDECTOMY    . CORONARY STENT INTERVENTION N/A 01/01/2017   Procedure: Coronary Stent Intervention;  Surgeon: Wellington Hampshire, MD;  Location: [place] [facility];  Service: Cardiovascular;  Laterality: N/A;  . KNEE SURGERY    . LEFT HEART CATH AND CORONARY ANGIOGRAPHY N/A 01/01/2017   Procedure: Left Heart Cath and Coronary Angiography;  Surgeon: Wellington Hampshire, MD;  Location: [place] [facility];  Service: Cardiovascular;  Laterality: N/A;    Physical  Exam:  Vitals:   08/02/19 0612  BP: 124/81  Pulse: 91  Resp: 18  Temp: 97.9 F (36.6 C)  SpO2: 95%   CN 2-12 grossly intact and symmetric. EOMI, PERRLA.  Nasal cavity packed with bilateral merocel at the time of arrival.  Oral cavity, lips, gums, ororpharynx normal with no masses or lesions. Poor dentition. Skin warm and dry.External nose and ears without masses or lesions.  Neck supple with no masses or lesions. No lymphadenopathy palpated. Thyroid normal with no masses.  Procedure: nasal endoscopy and control of nasal hemorrhage Indication: Epistaxis The left merocel pack was removed and clot was suctioned from the left nasal cavity. There was no active epistaxis from the left side.  The right merocel pack removed. The anterior nasal cavity was suctioned of clot. The adult flexible endoscope was used to evaluate the right nasal cavity. There was further clot in the right nasal cavity and nasopharynx which was suctioned. A full nasal cavity examination was performed. There was mild excoriation on the right mid septum without active bleeding, and an area of ulceration beneath a right anterior septal spur. After suctioned, the middle meatus was clear, as was the sphenoethmoidal recess and nasopharynx. The left side was unremarkable. Given no active epistaxis, Surgicel was placed at the mid septum, and anterior/inferior septum near the ulceration/spur. The patient tolerated the procedure well.   After this, called back as patient had recurrence of significant epistaxis and was repacked with a right  merocel by the ED team.    A/P: 10M with right  sided epistaxis of unclear definitive source with oozing still around merocel pack.  - Will proceed to the OR for bilateral nasal endoscopy with control of nasal hemorrhage, possible SPA ligation.  - Discussed risks with the patient who wishes to proceed; informed consent obtained    Bryan Ibarra 08/02/2019 9:01 AM

## 2019-08-02 NOTE — ED Notes (Signed)
Pt's nose began to bleed again. Large amount of bleeding noted. Dr. Mayford Knife at bedside.

## 2019-08-02 NOTE — ED Notes (Signed)
Pt reports he was smoking a cigarette on his front porch when he felt like his nose was runny and blew his nose. He then looked and saw that his shirt was bloody. Pt reports approx 8 beers last night. No other drug use reported

## 2019-08-02 NOTE — Progress Notes (Signed)
DR. Henrene Hawking aware patient on oxygen at 3 liters and saturation is 93-95 percent.  Pt. Coughs and saturation goes to 97 and 100 however it doesn't stay in this range.

## 2019-08-02 NOTE — ED Notes (Signed)
Pt provided with nasal clamp, instructed on proper clamping of nose to control bleeding. Pt has large blood clot hanging from right nare, states blood is running "down the back of my throat, I feel like i'm gonna gag". Pt tolerating nasal clamping well.

## 2019-08-02 NOTE — Progress Notes (Signed)
Dr. Henrene Hawking aware of oxygen saturation on room air between 87 and 89 percent.  Encouraged patient to take slow deep breaths.  Patient states he is a pack a day smoker also.

## 2019-08-02 NOTE — Progress Notes (Signed)
Chest xray completed in pacu, awaiting room assignment.

## 2019-08-02 NOTE — Progress Notes (Signed)
Contacted surgeon to notify of oxygen saturation.  Will also contact hospitalist.  Changed gown and warm blankets Given to patient.

## 2019-08-02 NOTE — Progress Notes (Signed)
Spoke with Dennie Bible in waiting area, she to go home and we will notify her of his room number in the hospital.

## 2019-08-02 NOTE — Progress Notes (Signed)
Pt admitted from PACU after nasal procedure for epitaxis. Pt alert/oriented. Tremorous but reports it just from being cold; comfort measures/extra blankets given. BP elevated/asymptomatic at admission. BP med given and will reassess. Tylenol given for nose pain. 02 continued at 3l/.  Ambulated to BR without 02- SOB upon return to bed with 02 reapplied. No bleeding noted; gel packing in place. Significant other arrived. Pt educated to report any sign/symptom ETOH withdrawal-states he will notify staff.

## 2019-08-02 NOTE — Op Note (Signed)
08/02/2019  1:11 PM   Bryan Ibarra  [id number]   Pre-Op Dx:  Refractory epistaxis  Post-op Dx: Same  Proc:   Nasal endoscopy with control of nasal hemorrhage (Right)   Surg:  [facility]   Anes:  General  EBL:  <38mL  Comp:  None  Findings:  Arterial bleeder from right basal lamella of middle turbinate, which was cauterized with suction bovie.    Procedure: The patient was seen in the ED and found to have epistaxis refractory to packing and difficulty tolerating further instrumentation. The decision was made to take the patient to the operating room urgently for control of nasal hemorrhage. The risks of the procedure were discussed with the patient and he wished to proceed. The patient was taken to the operating room and with the patient in a comfortable supine position,  general orotracheal anesthesia was induced without difficulty.  A proper time-out was performed.     The patient was prepped and draped in the standard fashion.The nose was next inspected with a zero degree endoscope first on the left side. Clot was evacuated from the nasopharynx. There was no bleeding on the left.   The right merocel was then removed. Clot was suctioned from the nasal cavity and nasopharynx. The bilateral nasal cavities were irrigated with nasal saline. The middle turbinate was initially lateralized and there was no bleeding in the posterior ethmoid/sphenoethmoidal recess. The middle turbinate was then lateralized. There was bleeding noted in the middle meatus, and an arterial bleed was found at the right lateral aspect of the middle turbinate at the basal lamella. This was cauterized with suction bovie. Irrigation was performed. A valsalva was performed x 2. There was no further bleeding noted. Surgicel was placed on the cauterized area, followed by Xerogel absorbable nasal packing.  Stomach contents were suctioned.   Care of the patient at this time was transferred to anesthesia and was  extubated and taken to PACU in good condition.   Dispo:   PACU to home     Tamela Gammon 08/02/2019 1:11 PM

## 2019-08-02 NOTE — Progress Notes (Signed)
15 minute call to floor, patient going to room 134.

## 2019-08-02 NOTE — Progress Notes (Signed)
Nebulizer done, patient coughed up large amount of clear phlegm.  No acute distress.  Oxygen up to 93 and 95 percent on room air.

## 2019-08-02 NOTE — Anesthesia Preprocedure Evaluation (Signed)
Anesthesia Evaluation  Patient identified by MRN, date of birth, ID band Patient awake    Reviewed: Allergy & Precautions, NPO status , Patient's Chart, lab work & pertinent test results  History of Anesthesia Complications Negative for: history of anesthetic complications  Airway Mallampati: II       Dental  (+) Missing, Poor Dentition, Chipped   Pulmonary neg sleep apnea, COPD,  COPD inhaler, Current Smoker,           Cardiovascular hypertension, Pt. on medications + Past MI and + Cardiac Stents  (-) CHF (-) dysrhythmias (-) Valvular Problems/Murmurs     Neuro/Psych neg Seizures    GI/Hepatic GERD  Poorly Controlled,(+)     substance abuse  alcohol use,   Endo/Other  neg diabetes  Renal/GU negative Renal ROS     Musculoskeletal   Abdominal   Peds  Hematology   Anesthesia Other Findings   Reproductive/Obstetrics                             Anesthesia Physical Anesthesia Plan  ASA: III and emergent  Anesthesia Plan: General   Post-op Pain Management:    Induction: Intravenous  PONV Risk Score and Plan: 1 and Ondansetron  Airway Management Planned: Oral ETT  Additional Equipment:   Intra-op Plan:   Post-operative Plan:   Informed Consent: I have reviewed the patients History and Physical, chart, labs and discussed the procedure including the risks, benefits and alternatives for the proposed anesthesia with the patient or authorized representative who has indicated his/her understanding and acceptance.       Plan Discussed with:   Anesthesia Plan Comments:         Anesthesia Quick Evaluation

## 2019-08-02 NOTE — Anesthesia Procedure Notes (Signed)
Procedure Name: Intubation Date/Time: 08/02/2019 12:21 PM Performed by: Estanislado Emms, CRNA Pre-anesthesia Checklist: Patient identified, Patient being monitored, Timeout performed, Emergency Drugs available and Suction available Patient Re-evaluated:Patient Re-evaluated prior to induction Oxygen Delivery Method: Circle system utilized Preoxygenation: Pre-oxygenation with 100% oxygen Induction Type: IV induction, Rapid sequence and Cricoid Pressure applied Laryngoscope Size: Miller and 2 Grade View: Grade II Tube type: Oral Tube size: 7.0 mm Number of attempts: 1 Airway Equipment and Method: Stylet Placement Confirmation: ETT inserted through vocal cords under direct vision,  positive ETCO2 and breath sounds checked- equal and bilateral Secured at: 22 cm Tube secured with: Tape Dental Injury: Teeth and Oropharynx as per pre-operative assessment

## 2019-08-02 NOTE — Progress Notes (Signed)
Hospitalist called Chartered certified accountant, he was given number to surgeon and he will contact him for admission.

## 2019-08-02 NOTE — Anesthesia Postprocedure Evaluation (Signed)
Anesthesia Post Note  Patient: Samar Dass.  Procedure(s) Performed: NASAL ENDOSCOPY (N/A Nose) EPISTAXIS CONTROL (N/A Nose)  Patient location during evaluation: PACU Anesthesia Type: General Level of consciousness: awake and alert Pain management: pain level controlled Vital Signs Assessment: post-procedure vital signs reviewed and stable Respiratory status: spontaneous breathing and respiratory function stable Cardiovascular status: stable Anesthetic complications: no Comments: Pt with decreased O2 saturations in the PACU. Incentive spirometry and duoneb tx given without much improvement. Hospitalist called to see pt for admission.     Last Vitals:  Vitals:   08/02/19 1430 08/02/19 1515  BP: (!) 151/97 136/85  Pulse: 100 99  Resp:    Temp:    SpO2: 100% 93%    Last Pain:  Vitals:   08/02/19 1515  TempSrc:   PainSc: 0-No pain                 , K

## 2019-08-02 NOTE — Progress Notes (Signed)
Dr. Henrene Hawking wants patient to have continuous sats Between 93-95 on room air.  Patient verbalizes understanding.

## 2019-08-02 NOTE — ED Notes (Signed)
Report to ashley and jennifer, rn.  

## 2019-08-02 NOTE — Progress Notes (Signed)
Ice chips given per patient request. 

## 2019-08-02 NOTE — ED Triage Notes (Signed)
Patient reports nose bleed that started approximately an hour prior to arrival, stopped just before arriving.

## 2019-08-03 DIAGNOSIS — J9601 Acute respiratory failure with hypoxia: Secondary | ICD-10-CM | POA: Diagnosis not present

## 2019-08-03 LAB — CBC
HCT: 33.8 % — ABNORMAL LOW (ref 39.0–52.0)
Hemoglobin: 12.2 g/dL — ABNORMAL LOW (ref 13.0–17.0)
MCH: 31.5 pg (ref 26.0–34.0)
MCHC: 36.1 g/dL — ABNORMAL HIGH (ref 30.0–36.0)
MCV: 87.3 fL (ref 80.0–100.0)
Platelets: 172 10*3/uL (ref 150–400)
RBC: 3.87 MIL/uL — ABNORMAL LOW (ref 4.22–5.81)
RDW: 11.8 % (ref 11.5–15.5)
WBC: 6.6 10*3/uL (ref 4.0–10.5)
nRBC: 0 % (ref 0.0–0.2)

## 2019-08-03 LAB — BASIC METABOLIC PANEL
Anion gap: 7 (ref 5–15)
BUN: 21 mg/dL (ref 8–23)
CO2: 26 mmol/L (ref 22–32)
Calcium: 9.1 mg/dL (ref 8.9–10.3)
Chloride: 102 mmol/L (ref 98–111)
Creatinine, Ser: 0.79 mg/dL (ref 0.61–1.24)
GFR calc Af Amer: >60 mL/min (ref >60)
GFR calc non Af Amer: >60 mL/min (ref >60)
Glucose, Bld: 188 mg/dL — ABNORMAL HIGH (ref 70–99)
Potassium: 4.4 mmol/L (ref 3.5–5.1)
Sodium: 135 mmol/L (ref 135–145)

## 2019-08-03 LAB — HIV ANTIBODY (ROUTINE TESTING W REFLEX): HIV Screen 4th Generation wRfx: NONREACTIVE

## 2019-08-03 MED ORDER — FOLIC ACID 1 MG PO TABS: 1.0000 mg | ORAL_TABLET | Freq: Every day | ORAL | 0 refills | Status: DC

## 2019-08-03 MED ORDER — DM-GUAIFENESIN ER 30-600 MG PO TB12: 1.0000 | ORAL_TABLET | Freq: Two times a day (BID) | ORAL | 0 refills | Status: DC

## 2019-08-03 MED ORDER — THIAMINE HCL 100 MG PO TABS: 100.0000 mg | ORAL_TABLET | Freq: Every day | ORAL | 0 refills | Status: DC

## 2019-08-03 MED ORDER — NICOTINE 21 MG/24HR TD PT24: 21.0000 mg | MEDICATED_PATCH | Freq: Every day | TRANSDERMAL | 0 refills | Status: DC

## 2019-08-03 MED ORDER — ADULT MULTIVITAMIN W/MINERALS CH: 1.0000 | ORAL_TABLET | Freq: Every day | ORAL | 0 refills | Status: DC

## 2019-08-03 MED ORDER — IPRATROPIUM BROMIDE HFA 17 MCG/ACT IN AERS: 2.0000 | INHALATION_SPRAY | Freq: Four times a day (QID) | RESPIRATORY_TRACT | 0 refills | Status: DC | PRN

## 2019-08-03 MED ORDER — CARVEDILOL 3.125 MG PO TABS
6.2500 mg | ORAL_TABLET | Freq: Once | ORAL | Status: AC
  Administered 2019-08-03: 6.25 mg via ORAL
  Filled 2019-08-03: qty 2

## 2019-08-03 NOTE — Progress Notes (Signed)
No active bleeding noted with small amount of old blood expectorated from back of throat this a.m. Noted increased HR with ambulating with MD notified with additional dose of coreg given on discharge. Oral and written discharge instructions given to pt and girlfriend with printed prescription with stated understanding. Tobacco cessation and ETOH use in relation to health and medications included with verbalized understanding. Pt transported in transport chair to private vehicle; discharge home to self/family care.

## 2019-08-03 NOTE — Discharge Instructions (Signed)
Nosebleed  A nosebleed is when blood comes out of the nose. Nosebleeds are common. They are usually not a sign of a serious medical problem. Follow these instructions at home: When you have a nosebleed:  Sit down.  Tilt your head a little forward.  Follow these steps: 1. Pinch your nose with a clean towel or tissue. 2. Keep pinching your nose for 10 minutes. Do not let go. 3. After 10 minutes, let go of your nose. 4. If there is still bleeding, do these steps again. Keep doing these steps until the bleeding stops.  Do not put things in your nose to stop the bleeding.  Try not to lie down or put your head back.  Use a nose spray decongestant as told by your doctor.  Do not use petroleum jelly or mineral oil in your nose. These things can get into your lungs. After a nosebleed:  Try not to blow your nose or sniffle for several hours.  Try not to strain, lift, or bend at the waist for several days.  Use saline spray or a humidifier as told by your doctor.  Aspirin and blood-thinning medicines make bleeding more likely. If you take these medicines, ask your doctor if you should stop taking them, or if you should change how much you take. Do not stop taking the medicine unless your doctor tells you to. Contact a doctor if:  You have a fever.  You get nosebleeds often.  You are getting nosebleeds more often than usual.  You bruise very easily.  You have something stuck in your nose.  You have bleeding in your mouth.  You throw up (vomit) or cough up brown material.  You get a nosebleed after you start a new medicine. Get help right away if:  You have a nosebleed after you fall or hurt your head.  Your nosebleed does not go away after 20 minutes.  You feel dizzy or weak.  You have unusual bleeding from other parts of your body.  You have unusual bruising on other parts of your body.  You get sweaty.  You throw up blood. Summary  Nosebleeds are common.  They are usually not a sign of a serious medical problem.  When you have a nosebleed, sit down and tilt your head a little forward. Pinch your nose with a clean tissue.  After the bleeding stops, try not to blow your nose or sniffle for several hours. This information is not intended to replace advice given to you by your health care provider. Make sure you discuss any questions you have with your health care provider. Document Revised: 06/29/2017 Document Reviewed: 10/27/2016 Elsevier Patient Education  Fieldale of Breath, Adult Shortness of breath means you have trouble breathing. Shortness of breath could be a sign of a medical problem. Follow these instructions at home:   Watch for any changes in your symptoms.  Do not use any products that contain nicotine or tobacco, such as cigarettes, e-cigarettes, and chewing tobacco.  Do not smoke. Smoking can cause shortness of breath. If you need help to quit smoking, ask your doctor.  Avoid things that can make it harder to breathe, such as: ? Mold. ? Dust. ? Air pollution. ? Chemical smells. ? Things that can cause allergy symptoms (allergens), if you have allergies.  Keep your living space clean. Use products that help remove mold and dust.  Rest as needed. Slowly return to your normal activities.  Take  over-the-counter and prescription medicines only as told by your doctor. This includes oxygen therapy and inhaled medicines.  Keep all follow-up visits as told by your doctor. This is important. Contact a doctor if:  Your condition does not get better as soon as expected.  You have a hard time doing your normal activities, even after you rest.  You have new symptoms. Get help right away if:  Your shortness of breath gets worse.  You have trouble breathing when you are resting.  You feel light-headed or you pass out (faint).  You have a cough that is not helped by medicines.  You cough up  blood.  You have pain with breathing.  You have pain in your chest, arms, shoulders, or belly (abdomen).  You have a fever.  You cannot walk up stairs.  You cannot exercise the way you normally do. These symptoms may represent a serious problem that is an emergency. Do not wait to see if the symptoms will go away. Get medical help right away. Call your local emergency services (911 in the [place] Do not drive yourself to the hospital. Summary  Shortness of breath is when you have trouble breathing enough air. It can be a sign of a medical problem.  Avoid things that make it hard for you to breathe, such as smoking, pollution, mold, and dust.  Watch for any changes in your symptoms. Contact your doctor if you do not get better or you get worse. This information is not intended to replace advice given to you by your health care provider. Make sure you discuss any questions you have with your health care provider. Document Revised: 12/17/2017 Document Reviewed: 12/17/2017 Elsevier Patient Education  2020 [employer].   Nosebleed, Adult A nosebleed is when blood comes out of the nose. Nosebleeds are common. Usually, they are not a sign of a serious condition. Nosebleeds can happen if a small blood vessel in your nose starts to bleed or if the lining of your nose (mucous membrane) cracks. They are commonly caused by:  Allergies.  Colds.  Picking your nose.  Blowing your nose too hard.  An injury from sticking an object into your nose or getting hit in the nose.  Dry or cold air. Less common causes of nosebleeds include:  Toxic fumes.  Something abnormal in the nose or in the air-filled spaces in the bones of the face (sinuses).  Growths in the nose, such as polyps.  Medicines or conditions that cause blood to clot slowly.  Certain illnesses or procedures that irritate or dry out the nasal passages. Follow these instructions at home: When you have a nosebleed:   Sit  down and tilt your head slightly forward.  Use a clean towel or tissue to pinch your nostrils under the bony part of your nose. After 10 minutes, let go of your nose and see if bleeding starts again. Do not release pressure before that time. If there is still bleeding, repeat the pinching and holding for 10 minutes until the bleeding stops.  Do not place tissues or gauze in the nose to stop bleeding.  Avoid lying down and avoid tilting your head backward. That may make blood collect in the throat and cause gagging or coughing.  Use a nasal spray decongestant to help with a nosebleed as told by your health care provider.  Do not use petroleum jelly or mineral oil in your nose. It can drip into your lungs. After a nosebleed:  Avoid blowing your  nose or sniffing for a number of hours.  Avoid straining, lifting, or bending at the waist for several days. You may resume other normal activities as you are able.  Use saline spray or a humidifier as told by your health care provider.  Aspirinand blood thinners make bleeding more likely. If you are prescribed these medicines and you suffer from nosebleeds: ? Ask your health care provider if you should stop taking the medicines or if you should adjust the dose. ? Do not stop taking medicines that your health care provider has recommended unless told by your health care provider.  If your nosebleed was caused by dry mucous membranes, use over-the-counter saline nasal spray or gel. This will keep the mucous membranes moist and allow them to heal. If you must use a lubricant: ? Choose one that is water-soluble. ? Use only as much as you need and use it only as often as needed. ? Do not lie down until several hours after you use it. Contact a health care provider if:  You have a fever.  You get nosebleeds often or more often than usual.  You bruise very easily.  You have a nosebleed from having something stuck in your nose.  You have bleeding  in your mouth.  You vomit or cough up brown material.  You have a nosebleed after you start a new medicine. Get help right away if:  You have a nosebleed after a fall or a head injury.  Your nosebleed does not go away after 20 minutes.  You feel dizzy or weak.  You have unusual bleeding from other parts of your body.  You have unusual bruising on other parts of your body.  You become sweaty.  You vomit blood. This information is not intended to replace advice given to you by your health care provider. Make sure you discuss any questions you have with your health care provider. Document Revised: 10/16/2017 Document Reviewed: 02/01/2016 Elsevier Patient Education  2020 [employer]   Shortness of Breath, Adult Shortness of breath means you have trouble breathing. Shortness of breath could be a sign of a medical problem. Follow these instructions at home:   Watch for any changes in your symptoms.  Do not use any products that contain nicotine or tobacco, such as cigarettes, e-cigarettes, and chewing tobacco.  Do not smoke. Smoking can cause shortness of breath. If you need help to quit smoking, ask your doctor.  Avoid things that can make it harder to breathe, such as: ? Mold. ? Dust. ? Air pollution. ? Chemical smells. ? Things that can cause allergy symptoms (allergens), if you have allergies.  Keep your living space clean. Use products that help remove mold and dust.  Rest as needed. Slowly return to your normal activities.  Take over-the-counter and prescription medicines only as told by your doctor. This includes oxygen therapy and inhaled medicines.  Keep all follow-up visits as told by your doctor. This is important. Contact a doctor if:  Your condition does not get better as soon as expected.  You have a hard time doing your normal activities, even after you rest.  You have new symptoms. Get help right away if:  Your shortness of breath gets  worse.  You have trouble breathing when you are resting.  You feel light-headed or you pass out (faint).  You have a cough that is not helped by medicines.  You cough up blood.  You have pain with breathing.  You have pain  in your chest, arms, shoulders, or belly (abdomen).  You have a fever.  You cannot walk up stairs.  You cannot exercise the way you normally do. These symptoms may represent a serious problem that is an emergency. Do not wait to see if the symptoms will go away. Get medical help right away. Call your local emergency services (911 in the [place] Do not drive yourself to the hospital. Summary  Shortness of breath is when you have trouble breathing enough air. It can be a sign of a medical problem.  Avoid things that make it hard for you to breathe, such as smoking, pollution, mold, and dust.  Watch for any changes in your symptoms. Contact your doctor if you do not get better or you get worse. This information is not intended to replace advice given to you by your health care provider. Make sure you discuss any questions you have with your health care provider. Document Revised: 12/17/2017 Document Reviewed: 12/17/2017 Elsevier Patient Education  2020 [employer].  Alcohol Intoxication Alcohol intoxication occurs when a person no longer thinks clearly or functions well (becomes impaired) after drinking alcohol. Intoxication can occur with just one drink. The legal definition of alcohol intoxication depends on the amount of alcohol in the blood (blood alcohol concentration, BAC). BAC of 80-100 mg/dL or higher is commonly considered legally intoxicated. The level of impairment depends on:  The amount of alcohol the person had.  The person's age, gender, and weight.  How often the person drinks.  Whether the person has other medical conditions, such as diabetes, seizures, or a heart condition. Alcohol intoxication can range from mild to severe. The condition  can be dangerous, especially if the person:  Also took certain drugs or prescription medicines.  Drinks a large amount of alcohol in a short period of time (binge drinks). ? For women, binge drinking is having four or more drinks at one time. ? For men, binge drinking is having five or more drinks at one time. If you or anyone around you appears intoxicated, speak up and act. What are the causes? This condition is caused by drinking alcohol. What increases the risk? The following factors may make you more likely to develop this condition:  Peer pressure in young adults.  Difficulty managing stress.  History of drug or alcohol abuse.  Combining alcohol with drugs.  Family history of drug or alcohol abuse.  Low body weight.  Binge drinking. What are the signs or symptoms? Symptoms of alcohol intoxication can vary from person to person. Symptoms can be mild, moderate, or severe. Symptoms of mild alcohol intoxication may include:  Feeling relaxed or sleepy.  Having mild difficulty with coordination, speech, memory, or attention. Symptoms of moderate alcohol intoxication may include:  Extreme emotions, like anger or sadness.  Moderate difficulty with coordination, speech, memory, or attention. Symptoms of severe alcohol intoxication may include:  Severe difficulty with coordination, speech, memory, or attention.  Passing out.  Vomiting.  Confusion.  Slow breathing.  Coma. Intoxication can change quickly from mild to severe. It can cause coma or death, especially in people who are not exposed to alcohol often. How is this diagnosed? Your health care provider will ask you how much alcohol you drank and what kind you had. Intoxication may also be diagnosed based on:  Your symptoms and medical history.  A physical exam.  A blood test that measures BAC.  A smell of alcohol on your breath. How is this treated? Treatment for alcohol  intoxication may  include:  Being monitored in an emergency department, hospital, or treatment center until your [facility] comes down and it is safe for you to go home.  IV fluids to prevent or treat loss of fluid in the body (dehydration).  Medicine to treat nausea or vomiting or to get rid of alcohol in the body.  Counseling (brief intervention) about the dangers of using alcohol.  Treatment for substance use disorder.  Oxygen therapy or a breathing machine (ventilator). Long-term (chronic) exposure to alcohol can have long-term effects on your brain, heart, and gastrointestinal system. These effects can be serious and may also require treatment. Follow these instructions at home:  Eating and drinking   Do not drink alcohol if: ? Your health care provider tells you not to drink. ? You are pregnant, may be pregnant, or are planning to become pregnant. ? You are under the legal drinking age (63 years old in the [place] ? You are taking medicines that should not be taken with alcohol. ? You have a medical condition, and alcohol makes it worse. ? You need to drive or perform activities that require you to be alert. ? You have substance use disorder.  Ask your health care provider if alcohol is safe for you. If your health care provider allows you to drink alcohol, limit how much you have. You may drink: ? 0-1 drink a day for women. ? 0-2 drinks a day for men.  Be aware of how much alcohol is in your drink. In the [place] one drink equals one 12 oz bottle of beer (355 mL), one 5 oz glass of wine (148 mL), or one 1 oz shot of hard liquor (44 mL).  Avoid drinking alcohol on an empty stomach.  Stay hydrated. Drink enough fluid to keep your urine pale yellow. Avoid caffeine because it can dehydrate you.  Avoid drinking more than one drink per hour.  When having multiple drinks, drink water or a non-alcoholic beverage between alcoholic drinks. General instructions  Take over-the-counter and prescription  medicines only as told by your health care provider.  Do not drive after drinking any amount of alcohol. Plan for a designated driver or another way to go home.  Have someone responsible stay with you while you are intoxicated. You should not be left alone.  Keep all follow-up visits as told by your health care provider. This is important. Contact a health care provider if:  You do not feel better after a few days.  You have problems at work, at school, or at home due to drinking. Get help right away if:  You have any of the following: ? Moderate to severe trouble with coordination, speech, memory, or attention. ? Trouble staying awake. ? Severe confusion. ? A seizure. ? Light-headedness. ? Fainting. ? Vomiting bright red blood or material that looks like coffee grounds. ? Bloody stool (feces). The blood may make your stool bright red, black, or tarry. It may also smell bad. ? Shakiness when trying to stop drinking. ? Thoughts about hurting yourself or others. If you ever feel like you may hurt yourself or others, or have thoughts about taking your own life, get help right away. You can go to your nearest emergency department or call:  Your local emergency services (911 in the [place]  A suicide crisis helpline, such as the National Suicide Prevention Lifeline at 650-822-4792. This is open 24 hours a day. Summary  Alcohol intoxication occurs when a person no longer thinks clearly  or functions well after drinking alcohol.  If your health care provider says that alcohol is safe for you, limit alcohol intake to no more than 1 drink a day for women (no drinks if you are pregnant) and 2 drinks a day for men. One drink equals 12 oz of beer, 5 oz of wine, or 1 oz of hard liquor.  Contact your health care provider if drinking has caused you problems at work, school, or home.  Get help right away if you have thoughts about hurting yourself or others. This information is not intended  to replace advice given to you by your health care provider. Make sure you discuss any questions you have with your health care provider. Document Revised: 11/06/2017 Document Reviewed: 11/06/2017 Elsevier Patient Education  2020 [employer] No strenuous activity for 2 weeks. No heavy lifting or straining. Cough and sneeze with mouth open.  In case of a nose bleed, use afrin nasal spray.   Use nasal saline spray at least 3 times per day.   Hold aspirin for 3 days after surgery.   If you wish to follow-up after surgery for any reason, please call [phone] to schedule an appointment.  Dr. Wells GuilesAckall does not see patients outside of the hospital, so your appointment will be with another physician.

## 2019-08-04 NOTE — Discharge Summary (Signed)
Physician Discharge Summary  Patient ID: Bryan Ibarra. MRN: [id number] DOB/AGE: 1956-06-29 64 y.o.  Admit date: 08/02/2019 Discharge date: 08/03/2019  Admission Diagnoses:  Discharge Diagnoses:  Principal Problem:   Acute respiratory failure with hypoxia (HCC) Active Problems:   Hypertension   Tobacco abuse   CAD in native artery   HLD (hyperlipidemia)   Epistaxis   Alcohol abuse   Discharged Condition: fair  Hospital Course:   64 y.o. male with medical history significant of hypertension, hyperlipidemia, alcohol abuse, tobacco abuse, CAD with DES stent placement 12/2016, who presents with epistaxis.  Pt states that he started having right sided epistaxis this morning while he was on his porch smoking a cigarette. He was found to have right sided epistaxis which did not resolve with afrin in ED. ENT was consulted. Per Dr. Wells Guiles, " A right sided merocel was placed and ultimately a left sided rhinorocket, followed by another merocel on the left for persistent bleeding". Finally, pt underwent nasal endoscopy and arterial bleeder from right basal lamella of middle turbinate was cauterized with suction bovie.his bleeding stopped.after the procedure, pt was found to have oxygen desaturation to 89% on room air.  Patient states that he has cough with clear mucus production and shortness of breath which has been going on for a while.  Denies chest pain, fever, but has chills.  Patient does not have nausea, vomiting, diarrhea, abdominal pain, symptoms of UTI or unilateral weakness.   Acute respiratory failure with hypoxia [facility]): Resolved  - Etiology is not clear.  Patient has a long history of smoking, possibly has undiagnosed COPD.  Chest x-ray showed  vascular congestion, but pt does not have leg edema, low suspicion for CHF. -Nebulizers: scheduled Duoneb and prn albuterol -Inhaler: Atrovent inhaler, prn Albuterol inhaler -Mucinex for cough  -Incentive spirometry -O2-sat above 92% on  RA and ambulation  -BNP WNL   HTN: Stable  -Continue home medications: Amlodipine, Coreg, lisinopril  Tobacco abuse and Alcohol abuse: -Did counseling about importance of quitting smoking -Nicotine patch -Did counseling about the importance of quitting drinking - Rx for nicotine patch given   Chronic alcoholism: No s/s of withdrawal  -Was placed CIWA protocol -- But dPt scored low and did not needed to be given benzo  - Counseled on cessation  - vitamins rx given   CAD in native artery: s/p of DES. No CP. -held ASA due to epistaxis; resumed on d/c  -Continue Crestor  Epistaxis: stopped after cauterization - no recurrent bleeding at all   HLD: -Crestor   Consults: anesthesia   Significant Diagnostic Studies: radiology and blood work   Treatments: as per hospital course and d/c medlist   Discharge Exam: Blood pressure 135/88, pulse 93, temperature 97.6 F (36.4 C), temperature source Oral, resp. rate (!) 22, height 6\' 2"  (1.88 m), weight 104.3 kg, SpO2 96 %.  General: Not in acute distress HEENT:       Eyes: PERRL, EOMI, no scleral icterus.       ENT: No discharge from the ears and nose, no pharynx injection, no tonsillar enlargement.        Neck: No JVD, no bruit, no mass felt. Heme: No neck lymph node enlargement. Cardiac: S1/S2, RRR, No murmurs, No gallops or rubs. Respiratory: No rales, wheezing, rhonchi or rubs. GI: Soft, nondistended, nontender, no rebound pain, no organomegaly, BS present. Ext: No pitting leg edema bilaterally. 2+DP/PT pulse bilaterally. Musculoskeletal: No joint deformities, No joint redness or warmth, no limitation of ROM  in spin. Neuro: Alert, oriented X3, cranial nerves II-XII grossly intact, moves all extremities normally.  Psych: Patient is not psychotic, no suicidal or hemocidal ideation.  Disposition: Discharge disposition: 01-Home or Self Care      Stable to d/c home with advise to f/u PCP  Warning S/S explained when he  must seek medical attention. Pt expressed understanding.   Discharge Instructions    Call MD for:  difficulty breathing, headache or visual disturbances   Complete by: As directed    Call MD for:  persistant dizziness or light-headedness   Complete by: As directed    Diet - low sodium heart healthy   Complete by: As directed    Increase activity slowly   Complete by: As directed      Allergies as of 08/03/2019   No Known Allergies     Medication List    TAKE these medications   albuterol 108 (90 Base) MCG/ACT inhaler Commonly known as: VENTOLIN HFA Inhale 1-2 puffs into the lungs every 6 (six) hours as needed for wheezing or shortness of breath. Use with spacer.   amLODipine 5 MG tablet Commonly known as: NORVASC Take 1 tablet (5 mg total) by mouth daily. Notes to patient: Monday morning   aspirin EC 81 MG tablet Take 1 tablet (81 mg total) by mouth daily. Hold for 3 days. What changed: additional instructions Notes to patient: HOLD FOR 3 DAYS!   carvedilol 6.25 MG tablet Commonly known as: COREG TAKE 1 TABLET(6.25 MG) BY MOUTH TWICE DAILY WITH A MEAL What changed:   how much to take  how to take this  when to take this Notes to patient: Supper tonight   dextromethorphan-guaiFENesin 30-600 MG 12hr tablet Commonly known as: MUCINEX DM Take 1 tablet by mouth 2 (two) times daily. Notes to patient: tonight   folic acid 1 MG tablet Commonly known as: FOLVITE Take 1 tablet (1 mg total) by mouth daily. Notes to patient: Monday morning   ipratropium 17 MCG/ACT inhaler Commonly known as: ATROVENT HFA Inhale 2 puffs into the lungs every 6 (six) hours as needed for wheezing.   lisinopril 40 MG tablet Commonly known as: ZESTRIL Take 1 tablet (40 mg total) by mouth daily. Notes to patient: Monday morning   multivitamin with minerals Tabs tablet Take 1 tablet by mouth daily. Notes to patient: Monday morning   nicotine 21 mg/24hr patch Commonly known as: NICODERM  CQ - dosed in mg/24 hours Place 1 patch (21 mg total) onto the skin daily. Notes to patient: Monday morning   oxyCODONE 5 MG immediate release tablet Commonly known as: Roxicodone Take 1 tablet (5 mg total) by mouth every 6 (six) hours as needed for severe pain. Notes to patient: None given in the last 6 hours.   rosuvastatin 10 MG tablet Commonly known as: CRESTOR TAKE 1 TABLET(10 MG) BY MOUTH DAILY What changed:   how much to take  how to take this  when to take this Notes to patient: Monday morning   thiamine 100 MG tablet Take 1 tablet (100 mg total) by mouth daily. Notes to patient: Monday morning      Follow-up Information    Wellington Hampshire, MD. Schedule an appointment as soon as possible for a visit in 7 day(s).   Specialty: Cardiology Contact information: [address] [place] [address] [phone]           Signed: Thornell Mule 08/04/2019, 8:44 PM

## 2019-09-08 ENCOUNTER — Other Ambulatory Visit: Payer: Self-pay | Admitting: Cardiovascular Disease

## 2019-09-08 MED ORDER — ROSUVASTATIN CALCIUM 10 MG PO TABS: ORAL_TABLET | ORAL | 0 refills | Status: DC

## 2019-09-08 NOTE — Telephone Encounter (Signed)
*  STAT* If patient is at the pharmacy, call can be transferred to refill team.   1. Which medications need to be refilled? (please list name of each medication and dose if known) Rosuvastatin 10 mg daily, albuterol inhaler   2. Which pharmacy/location (including street and city if local pharmacy) is medication to be sent to? [facility] in [place]  3. Do they need a 30 day or 90 day supply? 90

## 2019-10-14 ENCOUNTER — Telehealth: Payer: Self-pay | Admitting: Cardiovascular Disease

## 2019-10-14 NOTE — Telephone Encounter (Signed)
*  STAT* If patient is at the pharmacy, call can be transferred to refill team.   1. Which medications need to be refilled? (please list name of each medication and dose if known) albuterol inhaler  2. Which pharmacy/location (including street and city if local pharmacy) is medication to be sent to? [facility] in [place]  3. Do they need a 30 day or 90 day supply? 90 day

## 2019-10-14 NOTE — Telephone Encounter (Signed)
Please advise if OK to refill. No PCP. Thank you.

## 2019-10-15 NOTE — Telephone Encounter (Signed)
Okay to refill but please encourage him to establish with a primary care physician.

## 2019-10-16 MED ORDER — ALBUTEROL SULFATE HFA 108 (90 BASE) MCG/ACT IN AERS: 1.0000 | INHALATION_SPRAY | Freq: Four times a day (QID) | RESPIRATORY_TRACT | 2 refills | Status: DC | PRN

## 2019-10-16 NOTE — Telephone Encounter (Signed)
Spoke with the patient. Advised him that Dr. Kirke Corin will authorize the refill rqst for his Albuterol one more time. Advised the patient that he is encouraged to establish care with a pcp for further refills of his non-cardiac medications.  Provided the patient with the telephone number for [facility] to help him establish with a pcp. Patient verbalized understanding and voiced appreciation .

## 2019-12-30 ENCOUNTER — Other Ambulatory Visit: Payer: Self-pay

## 2019-12-30 ENCOUNTER — Encounter: Payer: Self-pay | Admitting: Cardiovascular Disease

## 2019-12-30 ENCOUNTER — Ambulatory Visit (INDEPENDENT_AMBULATORY_CARE_PROVIDER_SITE_OTHER): Payer: Medicaid Other | Admitting: Cardiovascular Disease

## 2019-12-30 VITALS — BP 148/100 | HR 73 | Ht 74.0 in | Wt 249.4 lb

## 2019-12-30 DIAGNOSIS — Z720 Tobacco use: Secondary | ICD-10-CM | POA: Diagnosis not present

## 2019-12-30 DIAGNOSIS — I2510 Atherosclerotic heart disease of native coronary artery without angina pectoris: Secondary | ICD-10-CM

## 2019-12-30 DIAGNOSIS — I10 Essential (primary) hypertension: Secondary | ICD-10-CM

## 2019-12-30 DIAGNOSIS — E785 Hyperlipidemia, unspecified: Secondary | ICD-10-CM | POA: Diagnosis not present

## 2019-12-30 MED ORDER — AMLODIPINE BESYLATE 5 MG PO TABS: 5.0000 mg | ORAL_TABLET | Freq: Every day | ORAL | 3 refills | Status: DC

## 2019-12-30 MED ORDER — ALBUTEROL SULFATE HFA 108 (90 BASE) MCG/ACT IN AERS: 1.0000 | INHALATION_SPRAY | Freq: Four times a day (QID) | RESPIRATORY_TRACT | 2 refills | Status: DC | PRN

## 2019-12-30 NOTE — Patient Instructions (Signed)
Medication Instructions:  Your physician recommends that you continue on your current medications as directed. Please refer to the Current Medication list given to you today.  Amlodipine and Albuterol inhaler has been refilled today.  *If you need a refill on your cardiac medications before your next appointment, please call your pharmacy*   Lab Work: None ordered If you have labs (blood work) drawn today and your tests are completely normal, you will receive your results only by: Marland Kitchen MyChart Message (if you have MyChart) OR . A paper copy in the mail If you have any lab test that is abnormal or we need to change your treatment, we will call you to review the results.   Testing/Procedures: None ordered   Follow-Up: At [facility], you and your health needs are our priority.  As part of our continuing mission to provide you with exceptional heart care, we have created designated Provider Care Teams.  These Care Teams include your primary Cardiologist (physician) and Advanced Practice Providers (APPs -  Physician Assistants and Nurse Practitioners) who all work together to provide you with the care you need, when you need it.  We recommend signing up for the patient portal called "MyChart".  Sign up information is provided on this After Visit Summary.  MyChart is used to connect with patients for Virtual Visits (Telemedicine).  Patients are able to view lab/test results, encounter notes, upcoming appointments, etc.  Non-urgent messages can be sent to your provider as well.   To learn more about what you can do with MyChart, go to [email or web address].    Your next appointment:   6 month(s)  The format for your next appointment:   In Person  Provider:    You may see Lorine Bears, MD or one of the following Advanced Practice Providers on your designated Care Team:    Nicolasa Ducking, NP  Eula Listen, PA-C  Marisue Ivan, PA-C    Other Instructions N/A

## 2019-12-30 NOTE — Progress Notes (Signed)
Cardiology Office Note   Date:  12/30/2019   ID:  Bryan Catalina., DOB 1956/02/14, MRN [id number]  PCP:  Bryan Ibarra, Bryan Ibarra  Cardiologist:   Lorine Bears, MD   Chief Complaint  Bryan Ibarra presents with  . office visit    Pt states swelling/ SOB/ fatigue. Meds verbally reviewed w/ pt.       History of Present Illness: Bryan Hayner. is a 64 y.o. male who presents for follow-up visit regarding coronary artery disease. He had non-ST elevation myocardial infarction in June 2018.  He underwent PCI and drug-eluting stent placement to the LAD without complications.  Ejection fraction was normal.  Other medical problems include hypertension, polysubstance abuse including tobacco, alcohol and marijuana and remote history of cocaine use. He did not tolerate high-dose atorvastatin due to myalgia.  He cut down on tobacco use to 6 cigarettes a day. He was hospitalized in January with severe epistaxis that required surgery and cauterization.  He was found to be mildly hypoxic and thought to have an element of COPD. He has been doing reasonably well with Bryan recent chest pain or worsening dyspnea.  He ran out of amlodipine 3 days ago and his blood pressure is elevated today.     Past Medical History:  Diagnosis Date  . Alcohol abuse    a. up to a 12 pack of beer/day.  . Arthritis   . CAD in native artery    a. LHC 6/18: severe 1 vessel CAD with 90% thrombotic stenosis in the proximal LAD which was felt to be the culprit for the Bryan Ibarra's non-STEMI. He underwent successful PCI/DES with a resolute onyx drug-eluting stent (3.0 x 18 mm). There was 0% residual stenosis. LVEF estimated at 55-65% by visual estimate. LVEDP was mildly elevated  . H/O echocardiogram    a. echo 6/18: EF 60-65%, normal wall motion, grade 1 diastolic dysfunction, RV cavity size was normal with normal wall thickness and normal RV systolic function. There were Bryan significant valvular abnormalities  . Hypertension     . Marijuana abuse    a. occasional.  . Tobacco abuse     Past Surgical History:  Procedure Laterality Date  . APPENDECTOMY    . CORONARY STENT INTERVENTION N/A 01/01/2017   Procedure: Coronary Stent Intervention;  Surgeon: Iran Ouch, MD;  Location: [facility] INVASIVE CV LAB;  Service: Cardiovascular;  Laterality: N/A;  . KNEE SURGERY    . LEFT HEART CATH AND CORONARY ANGIOGRAPHY N/A 01/01/2017   Procedure: Left Heart Cath and Coronary Angiography;  Surgeon: Iran Ouch, MD;  Location: [facility] INVASIVE CV LAB;  Service: Cardiovascular;  Laterality: N/A;  . NASAL ENDOSCOPY N/A 08/02/2019   Procedure: NASAL ENDOSCOPY;  Surgeon: Tamela Gammon, MD;  Location: [facility] ORS;  Service: ENT;  Laterality: N/A;  . NASAL HEMORRHAGE CONTROL N/A 08/02/2019   Procedure: EPISTAXIS CONTROL;  Surgeon: Tamela Gammon, MD;  Location: [facility] ORS;  Service: ENT;  Laterality: N/A;     Current Outpatient Medications  Medication Sig Dispense Refill  . albuterol (VENTOLIN HFA) 108 (90 Base) MCG/ACT inhaler Inhale 1-2 puffs into the lungs every 6 (six) hours as needed for wheezing or shortness of breath. Use with spacer. 8 g 2  . amLODipine (NORVASC) 5 MG tablet Take 1 tablet (5 mg total) by mouth daily. 180 tablet 3  . aspirin EC 81 MG tablet Take 1 tablet (81 mg total) by mouth daily. Hold for 3 days. 30 tablet 0  .  carvedilol (COREG) 6.25 MG tablet TAKE 1 TABLET(6.25 MG) BY MOUTH TWICE DAILY WITH A MEAL (Bryan Ibarra taking differently: Take 6.25 mg by mouth 2 (two) times daily with a meal. TAKE 1 TABLET(6.25 MG) BY MOUTH TWICE DAILY WITH A MEAL) 180 tablet 3  . lisinopril (ZESTRIL) 40 MG tablet Take 40 mg by mouth daily. Taking 1 tablet daily    . rosuvastatin (CRESTOR) 10 MG tablet TAKE 1 TABLET(10 MG) BY MOUTH DAILY 90 tablet 0   Bryan current facility-administered medications for this visit.    Allergies:   Bryan Ibarra has Bryan known allergies.    Social History:  The Bryan Ibarra  reports that he has been smoking  cigarettes. He has been smoking about 0.25 packs Ibarra day. He has never used smokeless tobacco. He reports current alcohol use. He reports current drug use. Drug: Marijuana.   Family History:  The Bryan Ibarra's family history includes CAD in his father and paternal grandfather.    ROS:  Please see the history of present illness.   Otherwise, review of systems are positive for none.   All other systems are reviewed and negative.    PHYSICAL EXAM: VS:  BP (!) 148/100 (BP Location: Left Arm, Bryan Ibarra Position: Sitting, Cuff Size: Normal)   Pulse 73   Ht 6\' 2"  (1.88 m)   Wt 249 lb 6 oz (113.1 kg)   SpO2 97%   BMI 32.02 kg/m  , BMI Body mass index is 32.02 kg/m. GEN: Well nourished, well developed, in Bryan acute distress  HEENT: normal  Neck: Bryan JVD, carotid bruits, or masses Cardiac: RRR; Bryan murmurs, rubs, or gallops,Bryan edema  Respiratory:  clear to auscultation bilaterally, normal work of breathing GI: soft, nontender, nondistended, + BS MS: Bryan deformity or atrophy  Skin: warm and dry, Bryan rash Neuro:  Strength and sensation are intact Psych: euthymic mood, full affect   EKG:  EKG is ordered today. EKG showed normal sinus rhythm with left axis deviation and low voltage.   Recent Labs: 08/02/2019: ALT 56; B Natriuretic Peptide 20.0 08/03/2019: BUN 21; Creatinine, Ser 0.79; Hemoglobin 12.2; Platelets 172; Potassium 4.4; Sodium 135    Lipid Panel    Component Value Date/Time   CHOL 218 (H) 06/24/2018 1725   TRIG 62 06/24/2018 1725   HDL 111 06/24/2018 1725   CHOLHDL 2.0 06/24/2018 1725   VLDL 12 06/24/2018 1725   LDLCALC 95 06/24/2018 1725   LDLDIRECT <4 06/24/2018 1725      Wt Readings from Last 3 Encounters:  12/30/19 249 lb 6 oz (113.1 kg)  08/02/19 229 lb 14.4 oz (104.3 kg)  06/19/19 225 lb (102.1 kg)     Bryan flowsheet data found.    ASSESSMENT AND PLAN:  1.  Coronary artery disease involving native coronary arteries without angina: He is doing reasonably well overall  with Bryan anginal symptoms.  Continue aspirin and rosuvastatin.  2.  Essential hypertension: Blood pressure is elevated but he ran out of amlodipine 3 days ago.  He does have refills on it so not entirely clear why pharmacy did not like the medication.  Will prescribe this medication again.  3.  Hyperlipidemia: Continue small dose rosuvastatin.  I reviewed his labs done in January when he presented with epistaxis.  His ALT was mildly elevated at 56.  The Bryan Ibarra will require a follow-up lipid profile upon follow-up.  4.  Tobacco use: I discussed with him the importance of smoking cessation.   5.  Excessive alcohol use I discussed with  him the importance of cutting down on alcohol intake.  He is hoping to establish care at [facility] primary care.    Disposition:   FU with me in 6 months  Signed,  Lorine Bears, MD  12/30/2019 4:06 PM    [facility]

## 2020-01-05 ENCOUNTER — Telehealth: Payer: Self-pay | Admitting: Cardiovascular Disease

## 2020-01-05 ENCOUNTER — Other Ambulatory Visit: Payer: Self-pay

## 2020-01-05 MED ORDER — ROSUVASTATIN CALCIUM 10 MG PO TABS: ORAL_TABLET | ORAL | 3 refills | Status: DC

## 2020-01-05 NOTE — Telephone Encounter (Signed)
*  STAT* If patient is at the pharmacy, call can be transferred to refill team.   1. Which medications need to be refilled? (please list name of each medication and dose if known) rosuvastatin 10 MG  2. Which pharmacy/location (including street and city if local pharmacy) is medication to be sent to? [facility] in [place]  3. Do they need a 30 day or 90 day supply? 90 day

## 2020-01-30 ENCOUNTER — Ambulatory Visit (INDEPENDENT_AMBULATORY_CARE_PROVIDER_SITE_OTHER): Payer: Medicaid Other | Admitting: Family Medicine

## 2020-01-30 ENCOUNTER — Encounter: Payer: Self-pay | Admitting: Family Medicine

## 2020-01-30 ENCOUNTER — Other Ambulatory Visit: Payer: Self-pay

## 2020-01-30 VITALS — BP 116/88 | HR 88 | Temp 98.6°F | Ht 74.0 in | Wt 245.0 lb

## 2020-01-30 DIAGNOSIS — E785 Hyperlipidemia, unspecified: Secondary | ICD-10-CM

## 2020-01-30 DIAGNOSIS — Z0000 Encounter for general adult medical examination without abnormal findings: Secondary | ICD-10-CM

## 2020-01-30 DIAGNOSIS — J4530 Mild persistent asthma, uncomplicated: Secondary | ICD-10-CM

## 2020-01-30 DIAGNOSIS — Z7689 Persons encountering health services in other specified circumstances: Secondary | ICD-10-CM | POA: Diagnosis not present

## 2020-01-30 DIAGNOSIS — Z13220 Encounter for screening for lipoid disorders: Secondary | ICD-10-CM

## 2020-01-30 DIAGNOSIS — H9201 Otalgia, right ear: Secondary | ICD-10-CM

## 2020-01-30 DIAGNOSIS — R635 Abnormal weight gain: Secondary | ICD-10-CM

## 2020-01-30 DIAGNOSIS — I10 Essential (primary) hypertension: Secondary | ICD-10-CM

## 2020-01-30 DIAGNOSIS — Z1329 Encounter for screening for other suspected endocrine disorder: Secondary | ICD-10-CM

## 2020-01-30 DIAGNOSIS — F1010 Alcohol abuse, uncomplicated: Secondary | ICD-10-CM

## 2020-01-30 DIAGNOSIS — R351 Nocturia: Secondary | ICD-10-CM

## 2020-01-30 DIAGNOSIS — J45909 Unspecified asthma, uncomplicated: Secondary | ICD-10-CM | POA: Insufficient documentation

## 2020-01-30 LAB — POCT URINALYSIS DIPSTICK
Bilirubin, UA: NEGATIVE
Blood, UA: NEGATIVE
Glucose, UA: NEGATIVE
Ketones, UA: NEGATIVE
Leukocytes, UA: NEGATIVE
Nitrite, UA: NEGATIVE
Protein, UA: NEGATIVE
Spec Grav, UA: 1.02 (ref 1.010–1.025)
Urobilinogen, UA: 0.2 E.U./dL
pH, UA: 5 (ref 5.0–8.0)

## 2020-01-30 LAB — HEMOGLOBIN A1C: Blood: 5.1

## 2020-01-30 MED ORDER — ALBUTEROL SULFATE HFA 108 (90 BASE) MCG/ACT IN AERS: 1.0000 | INHALATION_SPRAY | Freq: Four times a day (QID) | RESPIRATORY_TRACT | 2 refills | Status: DC | PRN

## 2020-01-30 MED ORDER — FLUTICASONE-SALMETEROL 100-50 MCG/DOSE IN AEPB: 1.0000 | INHALATION_SPRAY | Freq: Two times a day (BID) | RESPIRATORY_TRACT | 3 refills | Status: DC

## 2020-01-30 NOTE — Assessment & Plan Note (Signed)
History of asthma, using albuterol inhaler >2x per day, daily.  Has not met with pulmonology or had PFTs in the past.  Discussed obtaining better control with a daily inhaler and goal is to use rescue inhaler less than 2x per week.  Patient in agreement with plan.  Plan: 1. Refill on albuterol sent into pharmacy 2. Advair rx sent to pharmacy on file to take 1 puff 2x daily 3. RTC 4 weeks

## 2020-01-30 NOTE — Progress Notes (Signed)
Subjective:    Patient ID: Bryan Catalina., male    DOB: 1956/03/11, 64 y.o.   MRN: [id number]  Bryan Malizia. is a 64 y.o. male presenting on 01/30/2020 for Establish Care and Otitis Media (X1 month,right ear, difficult to hear, painful, no drainage, throbbing pain, soreness below ear, throwing right eye )   HPI  Previous PCP was Dr. Maryellen Pile in [place].  Records will be requested.  Past medical, family, and surgical history reviewed w/ pt.  Bryan Ibarra presents to clinic with acute concerns of right ear pain > 1 month with progressive loss of hearing, pain around right eye, pressure behind eye, soreness on right side of face and right mastoid.  Has used q-tips with peroxide to clean his ears out in the past.  Denies fevers, drainage from ear, rhinorrhea, visual changes, dizziness, vertigo, lightheadedness, ear popping.  Has not taken anything for his symptoms.  Depression screen [facility] 2/9 01/30/2020  Decreased Interest 0  Down, Depressed, Hopeless 0  PHQ - 2 Score 0  Altered sleeping 2  Tired, decreased energy 0  Change in appetite 0  Feeling bad or failure about yourself  0  Trouble concentrating 1  Moving slowly or fidgety/restless 0  Suicidal thoughts 0  PHQ-9 Score 3  Difficult doing work/chores Not difficult at all    Social History   Tobacco Use  . Smoking status: Current Every Day Smoker    Packs/day: 0.25    Years: 42.00    Pack years: 10.50    Types: Cigarettes  . Smokeless tobacco: Never Used  . Tobacco comment: smokes 2 cigarettes daily.   Vaping Use  . Vaping Use: Some days  Substance Use Topics  . Alcohol use: Yes    Comment: 40 beers per week  . Drug use: Not Currently    Types: Marijuana    Comment: occasional MJ.  Remote cocaine.    Review of Systems  Constitutional: Negative.   HENT: Positive for ear pain, hearing loss and sinus pressure. Negative for congestion, dental problem, drooling, ear discharge, facial swelling, mouth sores, nosebleeds,  postnasal drip, rhinorrhea, sinus pain, sneezing, sore throat, tinnitus, trouble swallowing and voice change.   Eyes: Negative.        Pressure behind right eye  Respiratory: Negative.   Cardiovascular: Negative.   Gastrointestinal: Negative.   Endocrine: Negative.   Genitourinary: Negative.   Musculoskeletal: Negative.   Skin: Negative.   Allergic/Immunologic: Negative.   Neurological: Negative.   Hematological: Negative.   Psychiatric/Behavioral: Negative.    Per HPI unless specifically indicated above     Objective:    BP 116/88   Pulse 88   Temp 98.6 F (37 C) (Oral)   Ht 6\' 2"  (1.88 m)   Wt 245 lb (111.1 kg)   SpO2 97%   BMI 31.46 kg/m   Wt Readings from Last 3 Encounters:  01/30/20 245 lb (111.1 kg)  12/30/19 249 lb 6 oz (113.1 kg)  08/02/19 229 lb 14.4 oz (104.3 kg)    Physical Exam Vitals reviewed.  Constitutional:      General: He is not in acute distress.    Appearance: Normal appearance. He is well-developed and well-groomed. He is obese. He is not ill-appearing or toxic-appearing.  HENT:     Head: Normocephalic and atraumatic.     Jaw: Tenderness present. No pain on movement.     Salivary Glands: Right salivary gland is not diffusely enlarged or tender. Left salivary gland is  not diffusely enlarged or tender.     Right Ear: Decreased hearing noted. No laceration, drainage or swelling.     Left Ear: Tympanic membrane, ear canal and external ear normal. There is no impacted cerumen.     Ears:     Comments: Large, white mass in right ear canal, circular, obscuring most of TM.    Nose: Nose normal. No congestion or rhinorrhea.     Comments: Lesia Sago is in place, covering mouth and nose.    Mouth/Throat:     Mouth: Mucous membranes are moist.     Pharynx: No oropharyngeal exudate or posterior oropharyngeal erythema.  Eyes:     General:        Right eye: No discharge.        Left eye: No discharge.     Extraocular Movements: Extraocular movements intact.       Conjunctiva/sclera: Conjunctivae normal.     Pupils: Pupils are equal, round, and reactive to light.  Cardiovascular:     Rate and Rhythm: Normal rate and regular rhythm.     Pulses: Normal pulses.     Heart sounds: Normal heart sounds. No murmur heard.  No friction rub. No gallop.   Pulmonary:     Effort: Pulmonary effort is normal. No respiratory distress.     Breath sounds: Normal breath sounds.  Musculoskeletal:     Right lower leg: No edema.     Left lower leg: No edema.  Skin:    General: Skin is warm and dry.     Capillary Refill: Capillary refill takes less than 2 seconds.  Neurological:     General: No focal deficit present.     Mental Status: He is alert and oriented to person, place, and time.  Psychiatric:        Attention and Perception: Attention and perception normal.        Mood and Affect: Mood and affect normal.        Speech: Speech normal.        Behavior: Behavior normal. Behavior is cooperative.        Thought Content: Thought content normal.        Cognition and Memory: Cognition and memory normal.    Results for orders placed or performed in visit on 01/30/20  Hemoglobin A1c  Result Value Ref Range   Blood 5.1   POCT urinalysis dipstick  Result Value Ref Range   Color, UA auburn    Clarity, UA clear    Glucose, UA Negative Negative   Bilirubin, UA neg    Ketones, UA neg    Spec Grav, UA 1.020 1.010 - 1.025   Blood, UA neg    pH, UA 5.0 5.0 - 8.0   Protein, UA Negative Negative   Urobilinogen, UA 0.2 0.2 or 1.0 E.U./dL   Nitrite, UA neg    Leukocytes, UA Negative Negative   Appearance auburn, clear    Odor none       Assessment & Plan:   Problem List Items Addressed This Visit      Cardiovascular and Mediastinum   Hypertension   Relevant Orders   CBC with Differential   COMPLETE METABOLIC PANEL WITH GFR   Lipid Profile     Other   HLD (hyperlipidemia)   Relevant Orders   Lipid Profile   Alcohol abuse   Relevant Orders    CBC with Differential   COMPLETE METABOLIC PANEL WITH GFR   Lipid Profile   Encounter to  establish care with new doctor    New patient establishment at [facility], previously followed with Dr. Maryellen Pile in [place], records will be requested.  Plan: 1. Have labs drawn today 2. RTC 4 weeks for CPE      Right ear pain    Right ear pain x >1 month with pressure behind right eye, right sided facial pain, mastoid tenderness.  Reports has had progressive loss of hearing.  Does on occasion use a q-tip with peroxide to clean his ears out.  Appears cholesteatoma vs. Foreign body.  Discussed urgent referral to ENT.  Plan: 1. Urgent Referral to ENT for evaluation      Relevant Orders   Ambulatory referral to ENT    Other Visit Diagnoses    Encounter to establish care    -  Primary   Relevant Orders   POCT urinalysis dipstick (Completed)   Hemoglobin A1c (Completed)   CBC with Differential   COMPLETE METABOLIC PANEL WITH GFR   Lipid Profile   Thyroid Panel With TSH   Routine medical exam       Relevant Orders   CBC with Differential   COMPLETE METABOLIC PANEL WITH GFR   Lipid Profile   Thyroid Panel With TSH   Weight gain       Relevant Orders   Thyroid Panel With TSH   Screening for thyroid disorder       Relevant Orders   Thyroid Panel With TSH   Screening for lipid disorders       Nocturia       Relevant Orders   PSA   Mild persistent asthma, unspecified whether complicated       Relevant Medications   albuterol (VENTOLIN HFA) 108 (90 Base) MCG/ACT inhaler   Fluticasone-Salmeterol (ADVAIR) 100-50 MCG/DOSE AEPB      Meds ordered this encounter  Medications  . albuterol (VENTOLIN HFA) 108 (90 Base) MCG/ACT inhaler    Sig: Inhale 1-2 puffs into the lungs every 6 (six) hours as needed for wheezing or shortness of breath. Use with spacer.    Dispense:  8 g    Refill:  2  . Fluticasone-Salmeterol (ADVAIR) 100-50 MCG/DOSE AEPB    Sig: Inhale 1 puff into the lungs 2 (two) times  daily.    Dispense:  1 each    Refill:  3      Follow up plan: Return in about 4 weeks (around 02/27/2020) for Physical.   Charlaine Dalton, FNP Family Nurse Practitioner [facility] 01/30/2020, 11:41 AM

## 2020-01-30 NOTE — Assessment & Plan Note (Signed)
Right ear pain x >1 month with pressure behind right eye, right sided facial pain, mastoid tenderness.  Reports has had progressive loss of hearing.  Does on occasion use a q-tip with peroxide to clean his ears out.  Appears cholesteatoma vs. Foreign body.  Discussed urgent referral to ENT.  Plan: 1. Urgent Referral to ENT for evaluation

## 2020-01-30 NOTE — Patient Instructions (Signed)
A referral to Ear, Nose and Throat has been placed today.  If you have not heard from the specialty office or our referral coordinator within 1 week, please let us know and we will follow up with the referral coordinator for an update.  Have your labs drawn and we will contact you when we receive the results.  We will plan to see you back in 4 weeks for physical  You will receive a survey after today's visit either digitally by e-mail or paper by USPS mail. Your experiences and feedback matter to [place].  Please respond so we know how we are doing as we provide care for you.  Call us with any questions/concerns/needs.  It is my goal to be available to you for your health concerns.  Thanks for choosing me to be a partner in your healthcare needs!  Charlaine Dalton, FNP-C Family Nurse Practitioner [facility] Phone: [phone]

## 2020-01-30 NOTE — Assessment & Plan Note (Signed)
New patient establishment at [facility], previously followed with Dr. Maryellen Pile in [place], records will be requested.  Plan: 1. Have labs drawn today 2. RTC 4 weeks for CPE

## 2020-01-31 LAB — CBC WITH DIFFERENTIAL/PLATELET
Absolute Monocytes: 616 cells/uL (ref 200–950)
Basophils Absolute: 53 cells/uL (ref 0–200)
Basophils Relative: 0.7 %
Eosinophils Absolute: 68 cells/uL (ref 15–500)
Eosinophils Relative: 0.9 %
HCT: 48.7 % (ref 38.5–50.0)
Hemoglobin: 16 g/dL (ref 13.2–17.1)
Lymphs Abs: 1398 cells/uL (ref 850–3900)
MCH: 31.4 pg (ref 27.0–33.0)
MCHC: 32.9 g/dL (ref 32.0–36.0)
MCV: 95.5 fL (ref 80.0–100.0)
MPV: 9.5 fL (ref 7.5–12.5)
Monocytes Relative: 8.1 %
Neutro Abs: 5464 cells/uL (ref 1500–7800)
Neutrophils Relative %: 71.9 %
Platelets: 245 10*3/uL (ref 140–400)
RBC: 5.1 10*6/uL (ref 4.20–5.80)
RDW: 13.1 % (ref 11.0–15.0)
Total Lymphocyte: 18.4 %
WBC: 7.6 10*3/uL (ref 3.8–10.8)

## 2020-01-31 LAB — COMPLETE METABOLIC PANEL WITH GFR
AG Ratio: 1.8 (calc) (ref 1.0–2.5)
ALT: 25 U/L (ref 9–46)
AST: 28 U/L (ref 10–35)
Albumin: 4.8 g/dL (ref 3.6–5.1)
Alkaline phosphatase (APISO): 81 U/L (ref 35–144)
BUN: 17 mg/dL (ref 7–25)
CO2: 22 mmol/L (ref 20–32)
Calcium: 9.8 mg/dL (ref 8.6–10.3)
Chloride: 107 mmol/L (ref 98–110)
Creat: 1.13 mg/dL (ref 0.70–1.25)
GFR, Est African American: 80 mL/min/{1.73_m2} (ref >=60)
GFR, Est Non African American: 69 mL/min/{1.73_m2} (ref >=60)
Globulin: 2.6 g/dL (calc) (ref 1.9–3.7)
Glucose, Bld: 92 mg/dL (ref 65–99)
Potassium: 5.1 mmol/L (ref 3.5–5.3)
Sodium: 137 mmol/L (ref 135–146)
Total Bilirubin: 0.5 mg/dL (ref 0.2–1.2)
Total Protein: 7.4 g/dL (ref 6.1–8.1)

## 2020-01-31 LAB — THYROID PANEL WITH TSH
Free Thyroxine Index: 1.9 (ref 1.4–3.8)
T3 Uptake: 32 % (ref 22–35)
T4, Total: 5.8 ug/dL (ref 4.9–10.5)
TSH: 0.89 mIU/L (ref 0.40–4.50)

## 2020-01-31 LAB — LIPID PANEL
Cholesterol: 153 mg/dL (ref <200)
HDL: 76 mg/dL (ref >=40)
LDL Cholesterol (Calc): 54 mg/dL (calc)
Non-HDL Cholesterol (Calc): 77 mg/dL (calc) (ref <130)
Total CHOL/HDL Ratio: 2 (calc) (ref <5.0)
Triglycerides: 142 mg/dL (ref <150)

## 2020-01-31 LAB — PSA: PSA: 3 ng/mL (ref <=4.0)

## 2020-02-03 DIAGNOSIS — T161XXA Foreign body in right ear, initial encounter: Secondary | ICD-10-CM | POA: Diagnosis not present

## 2020-02-03 DIAGNOSIS — H7401 Tympanosclerosis, right ear: Secondary | ICD-10-CM | POA: Diagnosis not present

## 2020-03-08 ENCOUNTER — Other Ambulatory Visit: Payer: Self-pay | Admitting: Cardiovascular Disease

## 2020-03-30 ENCOUNTER — Ambulatory Visit (INDEPENDENT_AMBULATORY_CARE_PROVIDER_SITE_OTHER): Payer: Medicaid Other | Admitting: Family Medicine

## 2020-03-30 ENCOUNTER — Other Ambulatory Visit: Payer: Self-pay

## 2020-03-30 ENCOUNTER — Encounter: Payer: Self-pay | Admitting: Family Medicine

## 2020-03-30 VITALS — BP 115/76 | HR 73 | Temp 97.7°F | Resp 20 | Ht 74.0 in | Wt 247.2 lb

## 2020-03-30 DIAGNOSIS — F1010 Alcohol abuse, uncomplicated: Secondary | ICD-10-CM

## 2020-03-30 DIAGNOSIS — J4530 Mild persistent asthma, uncomplicated: Secondary | ICD-10-CM

## 2020-03-30 DIAGNOSIS — R400 Somnolence: Secondary | ICD-10-CM | POA: Insufficient documentation

## 2020-03-30 DIAGNOSIS — Z1211 Encounter for screening for malignant neoplasm of colon: Secondary | ICD-10-CM | POA: Insufficient documentation

## 2020-03-30 NOTE — Assessment & Plan Note (Signed)
Currently >40 ETOH beverages per week.  Interested in cutting down on his ETOH intake but not stopping completely.  Requested xanax to help with his cravings, that he had taken this in the past with good relief.  Reports also had taken methadone in the past as well with good relief.  Discussed outpatient detox over the next 7 days, patient declines.  Discussed referral to psychiatry for concerns of substance abuse with ETOH and patient agreeable.  Plan: 1. Referral to psychiatry placed 2. Outpatient substance abuse location information provided

## 2020-03-30 NOTE — Assessment & Plan Note (Signed)
History of asthma, was previously using his albuterol rescue inhaler greater than 2x per day.  We started him on advair 2x per day approx 8 weeks ago.  Continues to use his rescue inhaler twice per day.  Discussed evaluation with pulmonary provider to obtain PFTs and a treatment plan that will better control his symptoms.  Patient in agreement with plan.  Plan: 1. Continue advair twice daily 2. Continue RESCUE inhaler, trying to not use this more than 2x per week 3. Referral placed to pulmonary

## 2020-03-30 NOTE — Progress Notes (Signed)
Subjective:    Patient ID: Bryan Ibarra., male    DOB: 12-06-1955, 64 y.o.   MRN: [id number]  Bryan Ibarra. is a 64 y.o. male presenting on 03/30/2020 for Asthma and Alcohol Problem (requesting Xanax to help to stop drinking )   HPI  Bryan Ibarra presents to clinic for follow up on his asthma and for concerns for cutting back on his alcohol intake.  Reports that he has been taking the advair twice daily and continues to use his rescue inhaler 2x per day, which has decreased from how he was using his rescue inhaler, but still not at goal of no more than 2x per week.  Reports is interested in cutting down on his alcohol intake, not to stop completely, and in the past he had a provider prescribe him xanax that would help with his cravings.  Has previously been on methadone that has helped with his cravings as well.  Currently >40 ETOH beverages per week.  He has a history of daytime fatigue, snoring, witnessed apneas in the past while sleeping.  Has not had a sleep study completed in the past.  Depression screen [facility] 2/9 01/30/2020  Decreased Interest 0  Down, Depressed, Hopeless 0  PHQ - 2 Score 0  Altered sleeping 2  Tired, decreased energy 0  Change in appetite 0  Feeling bad or failure about yourself  0  Trouble concentrating 1  Moving slowly or fidgety/restless 0  Suicidal thoughts 0  PHQ-9 Score 3  Difficult doing work/chores Not difficult at all    Social History   Tobacco Use  . Smoking status: Current Every Day Smoker    Packs/day: 0.25    Years: 42.00    Pack years: 10.50    Types: Cigarettes  . Smokeless tobacco: Never Used  . Tobacco comment: smokes 2 cigarettes daily.   Vaping Use  . Vaping Use: Former  Substance Use Topics  . Alcohol use: Yes    Comment: 40 beers per week  . Drug use: Not Currently    Types: Marijuana    Comment: occasional MJ.  Remote cocaine.    Review of Systems  Constitutional: Negative.   HENT: Negative.   Eyes: Negative.     Respiratory: Negative.   Cardiovascular: Negative.   Gastrointestinal: Negative.   Endocrine: Negative.   Genitourinary: Negative.   Musculoskeletal: Negative.   Skin: Negative.   Allergic/Immunologic: Negative.   Neurological: Negative.   Hematological: Negative.   Psychiatric/Behavioral: Negative.    Per HPI unless specifically indicated above     Objective:    BP 115/76 (BP Location: Right Arm, Patient Position: Sitting, Cuff Size: Large)   Pulse 73   Temp 97.7 F (36.5 C) (Oral)   Resp 20   Ht 6\' 2"  (1.88 m)   Wt 247 lb 3.2 oz (112.1 kg)   SpO2 98%   BMI 31.74 kg/m   Wt Readings from Last 3 Encounters:  03/30/20 247 lb 3.2 oz (112.1 kg)  01/30/20 245 lb (111.1 kg)  12/30/19 249 lb 6 oz (113.1 kg)    Physical Exam Vitals reviewed.  Constitutional:      General: He is not in acute distress.    Appearance: Normal appearance. He is well-developed and well-groomed. He is not ill-appearing or toxic-appearing.  HENT:     Head: Normocephalic and atraumatic.     Nose:     Comments: 02/29/20 is in place, covering mouth and nose. Eyes:  General:        Right eye: No discharge.        Left eye: No discharge.     Extraocular Movements: Extraocular movements intact.     Conjunctiva/sclera: Conjunctivae normal.     Pupils: Pupils are equal, round, and reactive to light.  Cardiovascular:     Rate and Rhythm: Normal rate and regular rhythm.     Pulses: Normal pulses.     Heart sounds: Normal heart sounds. No murmur heard.  No friction rub. No gallop.   Pulmonary:     Effort: Pulmonary effort is normal. No respiratory distress.     Breath sounds: Normal breath sounds.  Musculoskeletal:     Right lower leg: No edema.     Left lower leg: No edema.  Skin:    General: Skin is warm and dry.     Capillary Refill: Capillary refill takes less than 2 seconds.  Neurological:     General: No focal deficit present.     Mental Status: He is alert and oriented to person,  place, and time.  Psychiatric:        Attention and Perception: Attention and perception normal.        Mood and Affect: Mood and affect normal.        Speech: Speech normal.        Behavior: Behavior normal. Behavior is cooperative.        Thought Content: Thought content normal.        Cognition and Memory: Cognition and memory normal.    Results for orders placed or performed in visit on 01/30/20  Hemoglobin A1c  Result Value Ref Range   Blood 5.1   CBC with Differential  Result Value Ref Range   WBC 7.6 3.8 - 10.8 Thousand/uL   RBC 5.10 4.20 - 5.80 Million/uL   Hemoglobin 16.0 13.2 - 17.1 g/dL   HCT 16.148.7 38 - 50 %   MCV 95.5 80.0 - 100.0 fL   MCH 31.4 27.0 - 33.0 pg   MCHC 32.9 32.0 - 36.0 g/dL   RDW 09.613.1 04.511.0 - 40.915.0 %   Platelets 245 140 - 400 Thousand/uL   MPV 9.5 7.5 - 12.5 fL   Neutro Abs 5,464 1,500 - 7,800 cells/uL   Lymphs Abs 1,398 850 - 3,900 cells/uL   Absolute Monocytes 616 200 - 950 cells/uL   Eosinophils Absolute 68 15 - 500 cells/uL   Basophils Absolute 53 0 - 200 cells/uL   Neutrophils Relative % 71.9 %   Total Lymphocyte 18.4 %   Monocytes Relative 8.1 %   Eosinophils Relative 0.9 %   Basophils Relative 0.7 %  COMPLETE METABOLIC PANEL WITH GFR  Result Value Ref Range   Glucose, Bld 92 65 - 99 mg/dL   BUN 17 7 - 25 mg/dL   Creat 8.111.13 9.140.70 - 7.821.25 mg/dL   GFR, Est Non African American 69 > OR = 60 mL/min/1.573m2   GFR, Est African American 80 > OR = 60 mL/min/1.4773m2   BUN/Creatinine Ratio NOT APPLICABLE 6 - 22 (calc)   Sodium 137 135 - 146 mmol/L   Potassium 5.1 3.5 - 5.3 mmol/L   Chloride 107 98 - 110 mmol/L   CO2 22 20 - 32 mmol/L   Calcium 9.8 8.6 - 10.3 mg/dL   Total Protein 7.4 6.1 - 8.1 g/dL   Albumin 4.8 3.6 - 5.1 g/dL   Globulin 2.6 1.9 - 3.7 g/dL (calc)   AG Ratio 1.8 1.0 -  2.5 (calc)   Total Bilirubin 0.5 0.2 - 1.2 mg/dL   Alkaline phosphatase (APISO) 81 35 - 144 U/L   AST 28 10 - 35 U/L   ALT 25 9 - 46 U/L  Lipid Profile  Result  Value Ref Range   Cholesterol 153 <200 mg/dL   HDL 76 > OR = 40 mg/dL   Triglycerides 267 <124 mg/dL   LDL Cholesterol (Calc) 54 mg/dL (calc)   Total CHOL/HDL Ratio 2.0 <5.0 (calc)   Non-HDL Cholesterol (Calc) 77 <580 mg/dL (calc)  Thyroid Panel With TSH  Result Value Ref Range   T3 Uptake 32 22 - 35 %   T4, Total 5.8 4.9 - 10.5 mcg/dL   Free Thyroxine Index 1.9 1.4 - 3.8   TSH 0.89 0.40 - 4.50 mIU/L  PSA  Result Value Ref Range   PSA 3.0 < OR = 4.0 ng/mL  POCT urinalysis dipstick  Result Value Ref Range   Color, UA auburn    Clarity, UA clear    Glucose, UA Negative Negative   Bilirubin, UA neg    Ketones, UA neg    Spec Grav, UA 1.020 1.010 - 1.025   Blood, UA neg    pH, UA 5.0 5.0 - 8.0   Protein, UA Negative Negative   Urobilinogen, UA 0.2 0.2 or 1.0 E.U./dL   Nitrite, UA neg    Leukocytes, UA Negative Negative   Appearance [place], clear    Odor none       Assessment & Plan:   Problem List Items Addressed This Visit      Respiratory   Asthma - Primary    History of asthma, was previously using his albuterol rescue inhaler greater than 2x per day.  We started him on advair 2x per day approx 8 weeks ago.  Continues to use his rescue inhaler twice per day.  Discussed evaluation with pulmonary provider to obtain PFTs and a treatment plan that will better control his symptoms.  Patient in agreement with plan.  Plan: 1. Continue advair twice daily 2. Continue RESCUE inhaler, trying to not use this more than 2x per week 3. Referral placed to pulmonary      Relevant Orders   Ambulatory referral to Pulmonology     Other   Alcohol abuse    Currently >40 ETOH beverages per week.  Interested in cutting down on his ETOH intake but not stopping completely.  Requested xanax to help with his cravings, that he had taken this in the past with good relief.  Reports also had taken methadone in the past as well with good relief.  Discussed outpatient detox over the next 7 days,  patient declines.  Discussed referral to psychiatry for concerns of substance abuse with ETOH and patient agreeable.  Plan: 1. Referral to psychiatry placed 2. Outpatient substance abuse location information provided      Relevant Orders   Ambulatory referral to Psychiatry   Daytime sleepiness    STOPBANG 6/8, positive with witnessed apneas in the past while sleeping.  Discussed importance of completing a sleep study.  Plan: 1. Sleep study ordered      Relevant Orders   Home sleep test   Screening for colon cancer    Pt requiring colon cancer screening.  Denies family history of colon cancer.  Plan: - Discussed timing for initiation of colon cancer screening ACS vs USPSTF guidelines - Mutual decision making discussion for options of colonoscopy vs cologuard.  Pt prefers cologuard. - Ordered  Cologuard today      Relevant Orders   Cologuard      No orders of the defined types were placed in this encounter.  Follow up plan: Return in about 3 months (around 06/29/2020) for CPE.   Charlaine Dalton, FNP Family Nurse Practitioner [facility] 03/30/2020, 9:28 AM

## 2020-03-30 NOTE — Patient Instructions (Addendum)
We are sending in an order for a Cologuard colon cancer screening kit.  That should be sent to your home in the next few weeks.  Please complete according to the directions and we will be in touch with the results.  A referral to Psychiatry for concerns of substance use has been placed today.  If you have not heard from the specialty office or our referral coordinator within 1 week, please let us know and we will follow up with the referral coordinator for an update.  A referral to Pulmonary for assistance with your asthma has been placed today.  If you have not heard from the specialty office or our referral coordinator within 1 week, please let us know and we will follow up with the referral coordinator for an update.  A referral for a sleep study for concerns of suspected sleep apnea has been placed today.  If you have not heard from the specialty office or our referral coordinator within 1 week, please let us know and we will follow up with the referral coordinator for an update.   Self referral for substance program if need   [place] [facility] in [place], [facility] Address: [address], [place], [place] [address] Phone: [phone]  [facility], available walk-in 9am-4pm M-F [address], [place] [address] Hours: [place] (M-F, walk in available) [phone]  Alcoholics Anonymous (AA) [place] ([place]) Web Site: Factoring[employer] Answering Service: 216-497-3470  We will plan to see you back in 3 months for your physical  You will receive a survey after today's visit either digitally by e-mail or paper by [place] mail. Your experiences and feedback matter to [place].  Please respond so we know how we are doing as we provide care for you.  Call us with any questions/concerns/needs.  It is my goal to be available to you for your health concerns.  Thanks for choosing me to be a partner in your healthcare needs!  Harlin Rain, FNP-C Family Nurse Practitioner [facility] Phone: [phone]

## 2020-03-30 NOTE — Assessment & Plan Note (Signed)
STOPBANG 6/8, positive with witnessed apneas in the past while sleeping.  Discussed importance of completing a sleep study.  Plan: 1. Sleep study ordered

## 2020-03-30 NOTE — Assessment & Plan Note (Signed)
Pt requiring colon cancer screening.  Denies family history of colon cancer.  Plan: - Discussed timing for initiation of colon cancer screening ACS vs USPSTF guidelines - Mutual decision making discussion for options of colonoscopy vs cologuard.  Pt prefers cologuard. - Ordered Cologuard today 

## 2020-04-07 ENCOUNTER — Other Ambulatory Visit: Payer: Self-pay | Admitting: Family Medicine

## 2020-04-07 DIAGNOSIS — J4530 Mild persistent asthma, uncomplicated: Secondary | ICD-10-CM

## 2020-04-12 ENCOUNTER — Other Ambulatory Visit: Payer: Self-pay | Admitting: Family Medicine

## 2020-04-12 ENCOUNTER — Encounter: Payer: Self-pay | Admitting: Family Medicine

## 2020-05-04 ENCOUNTER — Other Ambulatory Visit: Payer: Self-pay | Admitting: Family Medicine

## 2020-05-04 DIAGNOSIS — J4530 Mild persistent asthma, uncomplicated: Secondary | ICD-10-CM

## 2020-05-04 NOTE — Telephone Encounter (Signed)
Due follow up in November- Rx until due

## 2020-06-11 ENCOUNTER — Ambulatory Visit: Payer: Self-pay | Admitting: *Deleted

## 2020-06-11 ENCOUNTER — Encounter: Payer: Self-pay | Admitting: Emergency Medicine

## 2020-06-11 ENCOUNTER — Other Ambulatory Visit: Payer: Self-pay

## 2020-06-11 ENCOUNTER — Emergency Department
Admission: EM | Admit: 2020-06-11 | Discharge: 2020-06-11 | Disposition: A | Discharge status: Home / Self Care | Payer: Medicaid Other | Attending: Emergency Medicine | Admitting: Emergency Medicine

## 2020-06-11 ENCOUNTER — Emergency Department: Payer: Medicaid Other

## 2020-06-11 DIAGNOSIS — Z7982 Long term (current) use of aspirin: Secondary | ICD-10-CM | POA: Insufficient documentation

## 2020-06-11 DIAGNOSIS — R0789 Other chest pain: Secondary | ICD-10-CM | POA: Insufficient documentation

## 2020-06-11 DIAGNOSIS — Z20822 Contact with and (suspected) exposure to covid-19: Secondary | ICD-10-CM | POA: Insufficient documentation

## 2020-06-11 DIAGNOSIS — I2510 Atherosclerotic heart disease of native coronary artery without angina pectoris: Secondary | ICD-10-CM | POA: Insufficient documentation

## 2020-06-11 DIAGNOSIS — R0602 Shortness of breath: Secondary | ICD-10-CM | POA: Diagnosis not present

## 2020-06-11 DIAGNOSIS — F17210 Nicotine dependence, cigarettes, uncomplicated: Secondary | ICD-10-CM | POA: Insufficient documentation

## 2020-06-11 DIAGNOSIS — Z79899 Other long term (current) drug therapy: Secondary | ICD-10-CM | POA: Diagnosis not present

## 2020-06-11 DIAGNOSIS — I10 Essential (primary) hypertension: Secondary | ICD-10-CM | POA: Insufficient documentation

## 2020-06-11 DIAGNOSIS — Z7951 Long term (current) use of inhaled steroids: Secondary | ICD-10-CM | POA: Diagnosis not present

## 2020-06-11 DIAGNOSIS — J45909 Unspecified asthma, uncomplicated: Secondary | ICD-10-CM | POA: Diagnosis not present

## 2020-06-11 DIAGNOSIS — R079 Chest pain, unspecified: Secondary | ICD-10-CM | POA: Diagnosis present

## 2020-06-11 DIAGNOSIS — R42 Dizziness and giddiness: Secondary | ICD-10-CM | POA: Diagnosis not present

## 2020-06-11 DIAGNOSIS — J441 Chronic obstructive pulmonary disease with (acute) exacerbation: Secondary | ICD-10-CM

## 2020-06-11 DIAGNOSIS — Z955 Presence of coronary angioplasty implant and graft: Secondary | ICD-10-CM | POA: Insufficient documentation

## 2020-06-11 DIAGNOSIS — R059 Cough, unspecified: Secondary | ICD-10-CM | POA: Diagnosis not present

## 2020-06-11 LAB — CBC
HCT: 45.1 % (ref 39.0–52.0)
Hemoglobin: 15.5 g/dL (ref 13.0–17.0)
MCH: 32.5 pg (ref 26.0–34.0)
MCHC: 34.4 g/dL (ref 30.0–36.0)
MCV: 94.5 fL (ref 80.0–100.0)
Platelets: 227 10*3/uL (ref 150–400)
RBC: 4.77 MIL/uL (ref 4.22–5.81)
RDW: 13 % (ref 11.5–15.5)
WBC: 6 10*3/uL (ref 4.0–10.5)
nRBC: 0 % (ref 0.0–0.2)

## 2020-06-11 LAB — BASIC METABOLIC PANEL WITH GFR
Anion gap: 11 (ref 5–15)
BUN: 11 mg/dL (ref 8–23)
CO2: 25 mmol/L (ref 22–32)
Calcium: 9.3 mg/dL (ref 8.9–10.3)
Chloride: 105 mmol/L (ref 98–111)
Creatinine, Ser: 1.04 mg/dL (ref 0.61–1.24)
GFR, Estimated: >60 mL/min
Glucose, Bld: 102 mg/dL — ABNORMAL HIGH (ref 70–99)
Potassium: 4.2 mmol/L (ref 3.5–5.1)
Sodium: 141 mmol/L (ref 135–145)

## 2020-06-11 LAB — RESPIRATORY PANEL BY RT PCR (FLU A&B, COVID)
Influenza A by PCR: NEGATIVE
Influenza B by PCR: NEGATIVE
SARS Coronavirus 2 by RT PCR: NEGATIVE

## 2020-06-11 LAB — TROPONIN I (HIGH SENSITIVITY)
Troponin I (High Sensitivity): 4 ng/L
Troponin I (High Sensitivity): 4 ng/L (ref <18)

## 2020-06-11 LAB — BRAIN NATRIURETIC PEPTIDE: B Natriuretic Peptide: 78.7 pg/mL (ref 0.0–100.0)

## 2020-06-11 MED ORDER — ALBUTEROL SULFATE HFA 108 (90 BASE) MCG/ACT IN AERS: 2.0000 | INHALATION_SPRAY | Freq: Four times a day (QID) | RESPIRATORY_TRACT | 2 refills | Status: DC | PRN

## 2020-06-11 MED ORDER — METHYLPREDNISOLONE SODIUM SUCC 125 MG IJ SOLR
125.0000 mg | Freq: Once | INTRAMUSCULAR | Status: AC
  Administered 2020-06-11: 125 mg via INTRAVENOUS
  Filled 2020-06-11: qty 2

## 2020-06-11 MED ORDER — ALBUTEROL SULFATE (2.5 MG/3ML) 0.083% IN NEBU
10.0000 mg | INHALATION_SOLUTION | Freq: Once | RESPIRATORY_TRACT | Status: AC
  Administered 2020-06-11: 10 mg via RESPIRATORY_TRACT
  Filled 2020-06-11: qty 12

## 2020-06-11 MED ORDER — PREDNISONE 50 MG PO TABS: 50.0000 mg | ORAL_TABLET | Freq: Every day | ORAL | 0 refills | Status: AC

## 2020-06-11 MED ORDER — IPRATROPIUM BROMIDE 0.02 % IN SOLN
0.5000 mg | Freq: Once | RESPIRATORY_TRACT | Status: AC
  Administered 2020-06-11: 0.5 mg via RESPIRATORY_TRACT
  Filled 2020-06-11: qty 2.5

## 2020-06-11 NOTE — Telephone Encounter (Signed)
What did they recommend for him to do?

## 2020-06-11 NOTE — ED Triage Notes (Signed)
Pt presents to ED via POV with c/o dizziness, HA, cough, CP x 4 days. Pt states cardiac hx, states "I figured it was some of my heart medications". Pt states bilateral lower chest pain. Pt denies radiation, states pain is intermittent. Pt states pain worsening with cough.

## 2020-06-11 NOTE — ED Provider Notes (Signed)
[facility] Emergency Department Provider Note ____________________________________________   First MD Initiated Contact with Patient 06/11/20 [phone]     (approximate)  I have reviewed the triage vital signs and the nursing notes.  HISTORY  Chief Complaint Dizziness and Chest Pain   HPI Bryan Ibarra. is a 63 y.o. malewho presents to the ED for evaluation of chest pain and dizziness.   Chart review indicates hx HTN, HLD, CAD s/p DES to his LAD in 2018 and polysubstance use with tobacco, alcohol and marijuana abuse documented. Patient is fully vaccinated for COVID-19.  Patient presents to the ED with his girlfriend, who provides additional history, with complaints of about 4-5 days of minimally productive cough, associated chest pain and presyncopal lightheaded dizziness without syncope.  He reports compliance with all of his medications at home, including his inhalers, but has had increased cough and difficulty breathing.  He reports similar sputum production compared to his baseline without discrete increase, but does report some subjective fever/chills.  He reports taste/smell changes with a persistent pepper taste in his nose, and reports poor p.o. intake but without abdominal pain, nausea or diarrhea.  Patient reports a mild substernal chest pain that is been constant for a matter of days, worse with coughing.  Denies syncope, trauma, increased sputum production, abdominal pain.  He cannot recall the last time he was on oral steroids or antibiotics.  Past Medical History:  Diagnosis Date  . Alcohol abuse    a. up to a 12 pack of beer/day.  . Arthritis   . CAD in native artery    a. LHC 6/18: severe 1 vessel CAD with 90% thrombotic stenosis in the proximal LAD which was felt to be the culprit for the patient's non-STEMI. He underwent successful PCI/DES with a resolute onyx drug-eluting stent (3.0 x 18 mm). There was 0% residual stenosis. LVEF estimated at  55-65% by visual estimate. LVEDP was mildly elevated  . H/O echocardiogram    a. echo 6/18: EF 60-65%, normal wall motion, grade 1 diastolic dysfunction, RV cavity size was normal with normal wall thickness and normal RV systolic function. There were no significant valvular abnormalities  . Hearing loss   . Heart attack (HCC)   . Hypertension   . Marijuana abuse    a. occasional.  . Tobacco abuse     Patient Active Problem List   Diagnosis Date Noted  . Daytime sleepiness 03/30/2020  . Screening for colon cancer 03/30/2020  . Encounter to establish care with new doctor 01/30/2020  . Right ear pain 01/30/2020  . Asthma 01/30/2020  . Alcohol abuse 08/02/2019  . Hypertension   . Tobacco abuse   . CAD in native artery   . HLD (hyperlipidemia)   . Epistaxis   . Acute respiratory failure with hypoxia (HCC)   . NSTEMI (non-ST elevated myocardial infarction) (HCC) 01/01/2017    Past Surgical History:  Procedure Laterality Date  . APPENDECTOMY    . CORONARY STENT INTERVENTION N/A 01/01/2017   Procedure: Coronary Stent Intervention;  Surgeon: Iran Ouch, MD;  Location: [facility] INVASIVE CV LAB;  Service: Cardiovascular;  Laterality: N/A;  . KNEE SURGERY    . LEFT HEART CATH AND CORONARY ANGIOGRAPHY N/A 01/01/2017   Procedure: Left Heart Cath and Coronary Angiography;  Surgeon: Iran Ouch, MD;  Location: [facility] INVASIVE CV LAB;  Service: Cardiovascular;  Laterality: N/A;  . NASAL ENDOSCOPY N/A 08/02/2019   Procedure: NASAL ENDOSCOPY;  Surgeon: Tamela Gammon, MD;  Location: [facility] ORS;  Service: ENT;  Laterality: N/A;  . NASAL HEMORRHAGE CONTROL N/A 08/02/2019   Procedure: EPISTAXIS CONTROL;  Surgeon: Tamela Gammon, MD;  Location: [facility] ORS;  Service: ENT;  Laterality: N/A;  . shunt heart attack      Prior to Admission medications   Medication Sig Start Date End Date Taking? Authorizing Provider  ADVAIR DISKUS 100-50 MCG/DOSE AEPB INHALE 1 PUFF INTO THE LUNGS TWICE DAILY 05/04/20    Malfi, Jodelle Gross, FNP  albuterol (VENTOLIN HFA) 108 (90 Base) MCG/ACT inhaler Inhale 2 puffs into the lungs every 6 (six) hours as needed for wheezing or shortness of breath. 06/11/20   Delton Prairie, MD  amLODipine (NORVASC) 5 MG tablet Take 1 tablet (5 mg total) by mouth daily. 12/30/19 03/29/20  Iran Ouch, MD  aspirin EC 81 MG tablet Take 1 tablet (81 mg total) by mouth daily. Hold for 3 days. 08/02/19   Ackall, Ardine Eng, MD  carvedilol (COREG) 6.25 MG tablet TAKE 1 TABLET(6.25 MG) BY MOUTH TWICE DAILY WITH A MEAL Patient taking differently: Take 6.25 mg by mouth 2 (two) times daily with a meal. TAKE 1 TABLET(6.25 MG) BY MOUTH TWICE DAILY WITH A MEAL 06/24/18   Dunn, Raymon Mutton, PA-C  lisinopril (ZESTRIL) 40 MG tablet TAKE 1 TABLET(40 MG) BY MOUTH DAILY 03/08/20   Iran Ouch, MD  Melatonin 10 MG TABS Take 2 tablets by mouth daily.    [provider]  predniSONE (DELTASONE) 50 MG tablet Take 1 tablet (50 mg total) by mouth daily with breakfast for 4 days. 06/11/20 06/15/20  Delton Prairie, MD  PROAIR HFA [phone] Base) MCG/ACT inhaler INHALE 1-2 PUFFS INTO LUNGS EVERY 6 HOURS AS NEEDED FOR WHEEZING OR SHORTNESS OF BREATH 04/07/20   Malfi, Jodelle Gross, FNP  rosuvastatin (CRESTOR) 10 MG tablet TAKE 1 TABLET(10 MG) BY MOUTH DAILY 01/05/20   Iran Ouch, MD    Allergies Patient has no known allergies.  Family History  Problem Relation Age of Onset  . CAD Father        s/p CABG in his 27's or 81's.  . Diabetes Father   . CAD Paternal Grandfather   . Colon polyps Brother     Social History Social History   Tobacco Use  . Smoking status: Current Every Day Smoker    Packs/day: 0.25    Years: 42.00    Pack years: 10.50    Types: Cigarettes  . Smokeless tobacco: Never Used  . Tobacco comment: smokes 2 cigarettes daily.   Vaping Use  . Vaping Use: Former  Substance Use Topics  . Alcohol use: Yes    Comment: 40 beers per week  . Drug use: Not Currently    Types: Marijuana     Comment: occasional MJ.  Remote cocaine.    Review of Systems  Constitutional: Positive for subjective chills Eyes: No visual changes. ENT: No sore throat. Cardiovascular: Positive for chest pain. Respiratory: Positive for cough and shortness of breath. Gastrointestinal: No abdominal pain.  No nausea, no vomiting.  No diarrhea.  No constipation.  Positive for poor p.o. intake Genitourinary: Negative for dysuria. Musculoskeletal: Negative for back pain. Skin: Negative for rash. Neurological: Negative for headaches, focal weakness or numbness.  ____________________________________________   PHYSICAL EXAM:  VITAL SIGNS: Vitals:   06/11/20 0836 06/11/20 1149  BP: (!) 146/87 116/89  Pulse: 98 81  Resp: 18 20  Temp: 98.5 F (36.9 C)   SpO2: 98% 97%  Constitutional: Alert and oriented. Well appearing and in no acute distress.  Sitting up in bed and conversational, though occasionally getting into coughing fits while we speak. Eyes: Conjunctivae are normal. PERRL. EOMI. Head: Atraumatic. Nose: No congestion/rhinnorhea. Mouth/Throat: Mucous membranes are moist.  Oropharynx non-erythematous. Neck: No stridor. No cervical spine tenderness to palpation. Cardiovascular: Normal rate, regular rhythm. Grossly normal heart sounds.  Good peripheral circulation. Respiratory: Mild tachypnea to the mid 20s, otherwise no evidence of distress.  Moderately decreased air movement throughout with faint expiratory wheezes, no focal features. Gastrointestinal: Soft , nondistended, nontender to palpation. No CVA tenderness. Musculoskeletal: No lower extremity tenderness.  No joint effusions. No signs of acute trauma. No pitting edema noted Neurologic:  Normal speech and language. No gross focal neurologic deficits are appreciated. No gait instability noted. Skin:  Skin is warm, dry and intact. No rash noted. Psychiatric: Mood and affect are normal. Speech and behavior are  normal.  ____________________________________________   LABS (all labs ordered are listed, but only abnormal results are displayed)  Labs Reviewed  BASIC METABOLIC PANEL - Abnormal; Notable for the following components:      Result Value   Glucose, Bld 102 (*)    All other components within normal limits  RESPIRATORY PANEL BY RT PCR (FLU A&B, COVID)  CBC  BRAIN NATRIURETIC PEPTIDE  TROPONIN I (HIGH SENSITIVITY)  TROPONIN I (HIGH SENSITIVITY)   ____________________________________________  12 Lead EKG  Sinus rhythm, rate of 90 bpm.  Normal axis and intervals.  No evidence of acute ischemia. ____________________________________________  RADIOLOGY  ED MD interpretation: 2 view CXR reviewed by me without evidence of acute cardiopulmonary pathology.  Official radiology report(s): DG Chest 2 View  Result Date: 06/11/2020 CLINICAL DATA:  Shortness of breath and cough.  Dizziness. EXAM: CHEST - 2 VIEW COMPARISON:  August 02, 2019 FINDINGS: Lungs are borderline hyperexpanded. No edema or airspace opacity. Heart size and pulmonary vascular normal. No adenopathy. There is aortic atherosclerosis. No bone lesions. There is calcification in the left carotid artery. IMPRESSION: Lungs borderline hyperexpanded without edema or airspace opacity. Cardiac silhouette within normal limits. Aortic Atherosclerosis (ICD10-I70.0). There is calcification in the left carotid artery. Electronically Signed   By: Bretta Bang III M.D.   On: 06/11/2020 09:06    ____________________________________________   PROCEDURES and INTERVENTIONS  Procedure(s) performed (including Critical Care):  .1-3 Lead EKG Interpretation Performed by: Delton Prairie, MD Authorized by: Delton Prairie, MD     Interpretation: normal     ECG rate:  80   ECG rate assessment: normal     Rhythm: sinus rhythm     Ectopy: none     Conduction: normal      Medications  albuterol (PROVENTIL) (2.5 MG/3ML) 0.083% nebulizer  solution 10 mg (10 mg Nebulization Given 06/11/20 1046)  ipratropium (ATROVENT) nebulizer solution 0.5 mg (0.5 mg Nebulization Given 06/11/20 1046)  methylPREDNISolone sodium succinate (SOLU-MEDROL) 125 mg/2 mL injection 125 mg (125 mg Intravenous Given 06/11/20 1045)    ____________________________________________   MDM / ED COURSE   64 year old smoker presents to the ED with shortness of breath, cough and chest pain most consistent with a COPD exacerbation and amenable to outpatient management with initiation of steroids.  Normal vital signs and room air.  Exam initially with an uncomfortable atropine patient is frequently coughing, has poor air movement and diffuse wheezes on auscultation.  He is in no significant distress beyond mild tachypnea.  CXR shows no infiltrates, and he has no increased sputum production to  suggest pneumonia or bacterial etiology of his exacerbation.  Blood work is unrevealing.  Patient has a history of diastolic CHF, he has no evidence of volume overload and his BNP is low.  Patient reports improving symptoms after steroids and breathing treatments.  Patient is agreeable to outpatient management, to think is reasonable.  We discussed return precautions for the ED and patient is medically stable discharge home.  Clinical Course as of Jun 12 1547  Caleen EssexFri Jun 11, 2020  1212 Reassessedl. Patient reporting improved symptoms. Improved air flow and no more wheezes on repeat auscultation   [DS]    Clinical Course User Index [DS] Delton Prairie, , MD    ____________________________________________   FINAL CLINICAL IMPRESSION(S) / ED DIAGNOSES  Final diagnoses:  Shortness of breath  Other chest pain  COPD exacerbation [facility](HCC)     ED Discharge Orders         Ordered    predniSONE (DELTASONE) 50 MG tablet  Daily with breakfast        06/11/20 1201    albuterol (VENTOLIN HFA) 108 (90 Base) MCG/ACT inhaler  Every 6 hours PRN        06/11/20 1201                Note:  This document was prepared using Conservation officer, historic buildingsDragon voice recognition software and may include unintentional dictation errors.   Delton Prairie, , MD 06/11/20 [phone]

## 2020-06-11 NOTE — Discharge Instructions (Signed)
Return to the ED with any worsening symptoms despite the prednisone.

## 2020-06-11 NOTE — ED Notes (Signed)
ED Provider at bedside. 

## 2020-06-11 NOTE — Telephone Encounter (Signed)
Pt called stating he is having problems breathing, walking, he has congestion and cough; the pt says he is having shortness of breath any time he takes a breath; he is also having dizziness; the pt says his symptoms started 4 days ago;  his inhalers have not helped; the pt says he is having shortness of breath with talking; recommendations made per nurse triage protocol; he verbalized understanding; the pt is seen by Danielle Rankin, [facility]; will route to office for notification  Reason for Disposition  [1] MODERATE difficulty breathing (e.g., speaks in phrases, SOB even at rest, pulse 100-120) AND [2] NEW-onset or WORSE than normal  Answer Assessment - Initial Assessment Questions 1. RESPIRATORY STATUS: "Describe your breathing?" (e.g., wheezing, shortness of breath, unable to speak, severe coughing)      Shortness of breath 2. ONSET: "When did this breathing problem begin?"      06/07/20 3. PATTERN "Does the difficult breathing come and go, or has it been constant since it started?"      constant 4. SEVERITY: "How bad is your breathing?" (e.g., mild, moderate, severe)    - MILD: No SOB at rest, mild SOB with walking, speaks normally in sentences, can lay down, no retractions, pulse < 100.    - MODERATE: SOB at rest, SOB with minimal exertion and prefers to sit, cannot lie down flat, speaks in phrases, mild retractions, audible wheezing, pulse 100-120.    - SEVERE: Very SOB at rest, speaks in single words, struggling to breathe, sitting hunched forward, retractions, pulse > 120      Shortness of breath when talking 5. RECURRENT SYMPTOM: "Have you had difficulty breathing before?" If Yes, ask: "When was the last time?" and "What happened that time?"      6. CARDIAC HISTORY: "Do you have any history of heart disease?" (e.g., heart attack, angina, bypass surgery, angioplasty)      HTN 7. LUNG HISTORY: "Do you have any history of lung disease?"  (e.g., pulmonary embolus, asthma,  emphysema)    asthma 8. CAUSE: "What do you think is causing the breathing problem?"      9. OTHER SYMPTOMS: "Do you have any other symptoms? (e.g., dizziness, runny nose, cough, chest pain, fever)     Severe cough, ribs sore due to coughing, headache, congestion 10. PREGNANCY: "Is there any chance you are pregnant?" "When was your last menstrual period?"       n/a 11. TRAVEL: "Have you traveled out of the country in the last month?" (e.g., travel history, exposures)  Protocols used: BREATHING DIFFICULTY-A-AH

## 2020-06-11 NOTE — ED Notes (Signed)
Radiology at bedside

## 2020-06-11 NOTE — Telephone Encounter (Signed)
Patient sent to ED. Bryan Ibarra aware through [facility] RN chat message.

## 2020-06-14 ENCOUNTER — Telehealth: Payer: Self-pay

## 2020-06-14 ENCOUNTER — Other Ambulatory Visit: Payer: Self-pay | Admitting: Family Medicine

## 2020-06-14 DIAGNOSIS — J4530 Mild persistent asthma, uncomplicated: Secondary | ICD-10-CM

## 2020-06-14 MED ORDER — ALBUTEROL SULFATE HFA 108 (90 BASE) MCG/ACT IN AERS: INHALATION_SPRAY | RESPIRATORY_TRACT | 2 refills | Status: DC

## 2020-06-14 NOTE — Telephone Encounter (Signed)
New Rx sent to pharmacy on file

## 2020-06-14 NOTE — Telephone Encounter (Signed)
error 

## 2020-06-14 NOTE — Telephone Encounter (Signed)
Copied from CRM [id number]. Topic: Quick Communication - See Telephone Encounter >> Jun 14, 2020 12:00 PM Aretta Nip wrote: CRM for notification. See Telephone encounter for: 06/14/20. This pt is reaching out to [facility] as he had an ER trip on the 12th and was prescirbed 2 meds, he was told that Katrinka Blazing, [place] dr that wrote script was not Medicaid approved, pt paid full price for one of the meds but albuterol (VENTOLIN HFA) 108 (90 Base) MCG/ACT inhaler Medication pt could not afford!  Date: 06/11/2020 Department: [facility] EMERGENCY DEPARTMENT Ordering/Authorizing: Delton Prairie, MD  Pt is wanting to see if Joni Reining could call this in as an inhaler would really be helpful else he can not afford Fu with pt  Please Advise.  KP

## 2020-06-15 ENCOUNTER — Other Ambulatory Visit: Payer: Self-pay | Admitting: Family Medicine

## 2020-06-15 ENCOUNTER — Other Ambulatory Visit: Payer: Self-pay | Admitting: Cardiovascular Disease

## 2020-06-15 DIAGNOSIS — J4530 Mild persistent asthma, uncomplicated: Secondary | ICD-10-CM

## 2020-06-15 MED ORDER — CARVEDILOL 6.25 MG PO TABS
ORAL_TABLET | ORAL | 0 refills | Status: DC
Start: 2020-06-15 — End: 2020-09-09

## 2020-06-15 MED ORDER — LISINOPRIL 40 MG PO TABS
ORAL_TABLET | ORAL | 0 refills | Status: DC
Start: 2020-06-15 — End: 2020-09-14

## 2020-06-15 MED ORDER — AMLODIPINE BESYLATE 5 MG PO TABS: 5.0000 mg | ORAL_TABLET | Freq: Every day | ORAL | 0 refills | Status: DC

## 2020-06-15 MED ORDER — ROSUVASTATIN CALCIUM 10 MG PO TABS
ORAL_TABLET | ORAL | 0 refills | Status: DC
Start: 2020-06-15 — End: 2020-09-09

## 2020-06-15 NOTE — Telephone Encounter (Signed)
°*  STAT* If patient is at the pharmacy, call can be transferred to refill team.   1. Which medications need to be refilled? (please list name of each medication and dose if known)   Amlodipine 5 mg po q d   Carvedilol 6.25 mg po BID Lisinopril 40 mg po q d  Rosuvastatin 10 mg po q  Albuterol 108 mcg/act inh q 6 hr prn Advair 100-50 mcg/aepb inh 1 puff BID Patient aware inhalers may be denied for refill and to call pcp or pulmonologist for refills on non cardiac meds    2. Which pharmacy/location (including street and city if local pharmacy) is medication to be sent to?   [facility] [address]   3. Do they need a 30 day or 90 day supply? 90  .

## 2020-06-15 NOTE — Addendum Note (Signed)
Addended by: Lonia Farber E on: 06/15/2020 12:49 PM   Modules accepted: Orders

## 2020-06-15 NOTE — Telephone Encounter (Signed)
PT need a refill  albuterol (PROAIR HFA) 108 (90 Base) MCG/ACT inhaler [[id number]]  [id number] [place]ree Ditto, [place] - [address] AT [facility] OF [address]  [address] [place] [address]  Phone: [phone] Fax: [phone]

## 2020-06-15 NOTE — Telephone Encounter (Signed)
Requested Prescriptions   Signed Prescriptions Disp Refills   carvedilol (COREG) 6.25 MG tablet 180 tablet 0    Sig: TAKE 1 TABLET(6.25 MG) BY MOUTH TWICE DAILY WITH A MEAL    Authorizing Provider: Lorine Bears A    Ordering User: NEWCOMER MCCLAIN,  L   rosuvastatin (CRESTOR) 10 MG tablet 90 tablet 0    Sig: TAKE 1 TABLET(10 MG) BY MOUTH DAILY    Authorizing Provider: Lorine Bears A    Ordering User: NEWCOMER MCCLAIN,  L   lisinopril (ZESTRIL) 40 MG tablet 90 tablet 0    Sig: TAKE 1 TABLET(40 MG) BY MOUTH DAILY    Authorizing Provider: Lorine Bears A    Ordering User: NEWCOMER MCCLAIN,  L   amLODipine (NORVASC) 5 MG tablet 90 tablet 0    Sig: Take 1 tablet (5 mg total) by mouth daily.    Authorizing Provider: Lorine Bears A    Ordering User: Thayer Headings,  L   Albuterol inhaler refilled by PCP yesterday. Patient aware to call PCP for refill of Advair as well.

## 2020-07-01 ENCOUNTER — Emergency Department: Payer: Medicaid Other

## 2020-07-01 ENCOUNTER — Ambulatory Visit: Payer: Self-pay | Admitting: *Deleted

## 2020-07-01 ENCOUNTER — Ambulatory Visit: Payer: Medicaid Other | Admitting: Cardiovascular Disease

## 2020-07-01 ENCOUNTER — Other Ambulatory Visit: Payer: Self-pay

## 2020-07-01 ENCOUNTER — Emergency Department
Admission: EM | Admit: 2020-07-01 | Discharge: 2020-07-01 | Disposition: A | Discharge status: Home / Self Care | Payer: Medicaid Other | Attending: Student in an Organized Health Care Education/Training Program | Admitting: Student in an Organized Health Care Education/Training Program

## 2020-07-01 DIAGNOSIS — Z20822 Contact with and (suspected) exposure to covid-19: Secondary | ICD-10-CM | POA: Diagnosis not present

## 2020-07-01 DIAGNOSIS — I10 Essential (primary) hypertension: Secondary | ICD-10-CM | POA: Diagnosis not present

## 2020-07-01 DIAGNOSIS — I2510 Atherosclerotic heart disease of native coronary artery without angina pectoris: Secondary | ICD-10-CM | POA: Insufficient documentation

## 2020-07-01 DIAGNOSIS — J40 Bronchitis, not specified as acute or chronic: Secondary | ICD-10-CM | POA: Diagnosis not present

## 2020-07-01 DIAGNOSIS — F17210 Nicotine dependence, cigarettes, uncomplicated: Secondary | ICD-10-CM | POA: Diagnosis not present

## 2020-07-01 DIAGNOSIS — Z79899 Other long term (current) drug therapy: Secondary | ICD-10-CM | POA: Insufficient documentation

## 2020-07-01 DIAGNOSIS — R519 Headache, unspecified: Secondary | ICD-10-CM | POA: Diagnosis not present

## 2020-07-01 DIAGNOSIS — R059 Cough, unspecified: Secondary | ICD-10-CM

## 2020-07-01 DIAGNOSIS — Z7982 Long term (current) use of aspirin: Secondary | ICD-10-CM | POA: Diagnosis not present

## 2020-07-01 DIAGNOSIS — R42 Dizziness and giddiness: Secondary | ICD-10-CM | POA: Diagnosis not present

## 2020-07-01 DIAGNOSIS — R0602 Shortness of breath: Secondary | ICD-10-CM | POA: Diagnosis not present

## 2020-07-01 LAB — CBC
HCT: 45.3 % (ref 39.0–52.0)
Hemoglobin: 15.8 g/dL (ref 13.0–17.0)
MCH: 32 pg (ref 26.0–34.0)
MCHC: 34.9 g/dL (ref 30.0–36.0)
MCV: 91.9 fL (ref 80.0–100.0)
Platelets: 213 10*3/uL (ref 150–400)
RBC: 4.93 MIL/uL (ref 4.22–5.81)
RDW: 11.9 % (ref 11.5–15.5)
WBC: 7.2 10*3/uL (ref 4.0–10.5)
nRBC: 0 % (ref 0.0–0.2)

## 2020-07-01 LAB — BASIC METABOLIC PANEL
Anion gap: 14 (ref 5–15)
BUN: 11 mg/dL (ref 8–23)
CO2: 23 mmol/L (ref 22–32)
Calcium: 9.6 mg/dL (ref 8.9–10.3)
Chloride: 99 mmol/L (ref 98–111)
Creatinine, Ser: 0.93 mg/dL (ref 0.61–1.24)
GFR, Estimated: >60 mL/min (ref >60)
Glucose, Bld: 110 mg/dL — ABNORMAL HIGH (ref 70–99)
Potassium: 3.7 mmol/L (ref 3.5–5.1)
Sodium: 136 mmol/L (ref 135–145)

## 2020-07-01 LAB — RESP PANEL BY RT-PCR (FLU A&B, COVID) ARPGX2
Influenza A by PCR: NEGATIVE
Influenza B by PCR: NEGATIVE
SARS Coronavirus 2 by RT PCR: NEGATIVE

## 2020-07-01 MED ORDER — PREDNISONE 10 MG PO TABS: 10.0000 mg | ORAL_TABLET | Freq: Every day | ORAL | 0 refills | Status: DC

## 2020-07-01 MED ORDER — AZITHROMYCIN 500 MG PO TABS
500.0000 mg | ORAL_TABLET | Freq: Once | ORAL | Status: AC
  Administered 2020-07-01: 500 mg via ORAL
  Filled 2020-07-01: qty 1

## 2020-07-01 MED ORDER — AZITHROMYCIN 500 MG PO TABS: 500.0000 mg | ORAL_TABLET | Freq: Every day | ORAL | 0 refills | Status: AC

## 2020-07-01 NOTE — Telephone Encounter (Signed)
Note sent to incorrect office- apologies.

## 2020-07-01 NOTE — Telephone Encounter (Signed)
Patient was seen at ED 2-3 weeks ago- patient reports he is not better- he maybe some worse. Patient states he still has cough with heavy mucus- changing color to dark tan. Patient states he gets fatigued/dizzy with cough.Patient states he has started wheezing. Patient has appointment with cardiology today- advised they may not see him with these symptoms. Advised patient to go back to ED- he states he will.  Reason for Disposition . [1] Continuous (nonstop) coughing interferes with work or school AND [2] no improvement using cough treatment per Care Advice  Answer Assessment - Initial Assessment Questions 1. ONSET: "When did the cough begin?"      2-3 weeeks 2. SEVERITY: "How bad is the cough today?"      Cough makes him dizzy- coughing all the time- treating with mucinex 3. SPUTUM: "Describe the color of your sputum" (none, dry cough; clear, white, yellow, green)     Brownish in color 4. HEMOPTYSIS: "Are you coughing up any blood?" If so ask: "How much?" (flecks, streaks, tablespoons, etc.)     no 5. DIFFICULTY BREATHING: "Are you having difficulty breathing?" If Yes, ask: "How bad is it?" (e.g., mild, moderate, severe)    - MILD: No SOB at rest, mild SOB with walking, speaks normally in sentences, can lay down, no retractions, pulse < 100.    - MODERATE: SOB at rest, SOB with minimal exertion and prefers to sit, cannot lie down flat, speaks in phrases, mild retractions, audible wheezing, pulse 100-120.    - SEVERE: Very SOB at rest, speaks in single words, struggling to breathe, sitting hunched forward, retractions, pulse > 120      Mild/moderate 6. FEVER: "Do you have a fever?" If Yes, ask: "What is your temperature, how was it measured, and when did it start?"     no 7. CARDIAC HISTORY: "Do you have any history of heart disease?" (e.g., heart attack, congestive heart failure)      Yes- heart attcak 8. LUNG HISTORY: "Do you have any history of lung disease?"  (e.g., pulmonary embolus,  asthma, emphysema)     Yes- asthma 9. PE RISK FACTORS: "Do you have a history of blood clots?" (or: recent major surgery, recent prolonged travel, bedridden)     no 10. OTHER SYMPTOMS: "Do you have any other symptoms?" (e.g., runny nose, wheezing, chest pain)       Wheezing, runny nose, chest pain 11. PREGNANCY: "Is there any chance you are pregnant?" "When was your last menstrual period?"       n/a 12. TRAVEL: "Have you traveled out of the country in the last month?" (e.g., travel history, exposures)       No none  Protocols used: COUGH - ACUTE PRODUCTIVE-A-AH

## 2020-07-01 NOTE — ED Triage Notes (Signed)
Pt to ED for chief complaint of dizziness, headahce, shob, cough x 2 weeks.  States cannot go to PCP for sx d/t COVID sx is sent to ER.  Denies CP.  RR even and unlabored, speaking in complete sentences.

## 2020-07-01 NOTE — Discharge Instructions (Addendum)
The results of your covid test should be available in your MyChart this evening.  Please follow up with PCP.  Return for any additional questions or concerns.

## 2020-07-01 NOTE — ED Provider Notes (Signed)
[facility] Emergency Department Provider Note    First MD Initiated Contact with Patient 07/01/20 1738     (approximate)  I have reviewed the triage vital signs and the nursing notes.   HISTORY  Chief Complaint Dizziness and Shortness of Breath    HPI Bryan Ibarra. is a 64 y.o. male below listed past medical history with known COPD as well as coronary disease presents to the ER for 2 to 3 weeks of persistent congestion cough productive in nature.  Was recently given present pack felt like he had some mild improvement but that is come back and gotten worse.  But feels like he is having worsening productive sputum.  Is not been on any antibiotics.  He is having headaches.  States he has had some change in his taste.  Denies any chest pain or pressure.  Feels like his breathing is otherwise at his baseline.    Past Medical History:  Diagnosis Date  . Alcohol abuse    a. up to a 12 pack of beer/day.  . Arthritis   . CAD in native artery    a. LHC 6/18: severe 1 vessel CAD with 90% thrombotic stenosis in the proximal LAD which was felt to be the culprit for the patient's non-STEMI. He underwent successful PCI/DES with a resolute onyx drug-eluting stent (3.0 x 18 mm). There was 0% residual stenosis. LVEF estimated at 55-65% by visual estimate. LVEDP was mildly elevated  . H/O echocardiogram    a. echo 6/18: EF 60-65%, normal wall motion, grade 1 diastolic dysfunction, RV cavity size was normal with normal wall thickness and normal RV systolic function. There were no significant valvular abnormalities  . Hearing loss   . Heart attack (HCC)   . Hypertension   . Marijuana abuse    a. occasional.  . Tobacco abuse    Family History  Problem Relation Age of Onset  . CAD Father        s/p CABG in his 47's or 87's.  . Diabetes Father   . CAD Paternal Grandfather   . Colon polyps Brother    Past Surgical History:  Procedure Laterality Date  .  APPENDECTOMY    . CORONARY STENT INTERVENTION N/A 01/01/2017   Procedure: Coronary Stent Intervention;  Surgeon: Iran Ouch, MD;  Location: [facility] INVASIVE CV LAB;  Service: Cardiovascular;  Laterality: N/A;  . KNEE SURGERY    . LEFT HEART CATH AND CORONARY ANGIOGRAPHY N/A 01/01/2017   Procedure: Left Heart Cath and Coronary Angiography;  Surgeon: Iran Ouch, MD;  Location: [facility] INVASIVE CV LAB;  Service: Cardiovascular;  Laterality: N/A;  . NASAL ENDOSCOPY N/A 08/02/2019   Procedure: NASAL ENDOSCOPY;  Surgeon: Tamela Gammon, MD;  Location: [facility] ORS;  Service: ENT;  Laterality: N/A;  . NASAL HEMORRHAGE CONTROL N/A 08/02/2019   Procedure: EPISTAXIS CONTROL;  Surgeon: Tamela Gammon, MD;  Location: [facility] ORS;  Service: ENT;  Laterality: N/A;  . shunt heart attack     Patient Active Problem List   Diagnosis Date Noted  . Daytime sleepiness 03/30/2020  . Screening for colon cancer 03/30/2020  . Encounter to establish care with new doctor 01/30/2020  . Right ear pain 01/30/2020  . Asthma 01/30/2020  . Alcohol abuse 08/02/2019  . Hypertension   . Tobacco abuse   . CAD in native artery   . HLD (hyperlipidemia)   . Epistaxis   . Acute respiratory failure with hypoxia (HCC)   .  NSTEMI (non-ST elevated myocardial infarction) (HCC) 01/01/2017      Prior to Admission medications   Medication Sig Start Date End Date Taking? Authorizing Provider  ADVAIR DISKUS 100-50 MCG/DOSE AEPB INHALE 1 PUFF INTO THE LUNGS TWICE DAILY 05/04/20   Malfi, Jodelle Gross, FNP  albuterol (PROAIR HFA) 108 (90 Base) MCG/ACT inhaler INHALE 1-2 PUFFS INTO LUNGS EVERY 6 HOURS AS NEEDED FOR WHEEZING OR SHORTNESS OF BREATH 06/14/20   Malfi, Jodelle Gross, FNP  amLODipine (NORVASC) 5 MG tablet Take 1 tablet (5 mg total) by mouth daily. 06/15/20 09/13/20  Iran Ouch, MD  aspirin EC 81 MG tablet Take 1 tablet (81 mg total) by mouth daily. Hold for 3 days. 08/02/19   Ackall, Ardine Eng, MD  azithromycin (ZITHROMAX) 500 MG  tablet Take 1 tablet (500 mg total) by mouth daily for 3 days. Take 1 tablet daily for 3 days. 07/01/20 07/04/20  Willy Eddy, MD  carvedilol (COREG) 6.25 MG tablet TAKE 1 TABLET(6.25 MG) BY MOUTH TWICE DAILY WITH A MEAL 06/15/20   Iran Ouch, MD  lisinopril (ZESTRIL) 40 MG tablet TAKE 1 TABLET(40 MG) BY MOUTH DAILY 06/15/20   Iran Ouch, MD  Melatonin 10 MG TABS Take 2 tablets by mouth daily.    [provider]  predniSONE (DELTASONE) 10 MG tablet Take 1 tablet (10 mg total) by mouth daily. Day 1-2: Take 50 mg  ( 5 pills) Day 3-4 : Take 40 mg (4pills) Day 5-6: Take 30 mg (3 pills) Day 7-8:  Take 20 mg (2 pills) Day 9:  Take 10mg  (1 pill) 07/01/20   14/2/21, MD  rosuvastatin (CRESTOR) 10 MG tablet TAKE 1 TABLET(10 MG) BY MOUTH DAILY 06/15/20   06/17/20, MD    Allergies Patient has no known allergies.    Social History Social History   Tobacco Use  . Smoking status: Current Every Day Smoker    Packs/day: 0.25    Years: 42.00    Pack years: 10.50    Types: Cigarettes  . Smokeless tobacco: Never Used  . Tobacco comment: smokes 2 cigarettes daily.   Vaping Use  . Vaping Use: Former  Substance Use Topics  . Alcohol use: Yes    Comment: 40 beers per week  . Drug use: Not Currently    Types: Marijuana    Comment: occasional MJ.  Remote cocaine.    Review of Systems Patient denies headaches, rhinorrhea, blurry vision, numbness, shortness of breath, chest pain, edema, cough, abdominal pain, nausea, vomiting, diarrhea, dysuria, fevers, rashes or hallucinations unless otherwise stated above in HPI. ____________________________________________   PHYSICAL EXAM:  VITAL SIGNS: Vitals:   07/01/20 1415  BP: 121/87  Pulse: 79  Resp: 20  Temp: 98 F (36.7 C)  SpO2: 95%    Constitutional: Alert and oriented.  Eyes: Conjunctivae are normal.  Head: Atraumatic. Nose: No congestion/rhinnorhea. Mouth/Throat: Mucous membranes are moist.     Neck: No stridor. Painless ROM.  Cardiovascular: Normal rate, regular rhythm. Grossly normal heart sounds.  Good peripheral circulation. Respiratory: Normal respiratory effort.  No retractions. Lungs with scattered expiratory wheeze Gastrointestinal: Soft and nontender. No distention. No abdominal bruits. No CVA tenderness. Genitourinary:  Musculoskeletal: No lower extremity tenderness nor edema.  No joint effusions. Neurologic:  Normal speech and language. No gross focal neurologic deficits are appreciated. No facial droop Skin:  Skin is warm, dry and intact. No rash noted. Psychiatric: Mood and affect are normal. Speech and behavior are normal.  ____________________________________________  LABS (all labs ordered are listed, but only abnormal results are displayed)  Results for orders placed or performed during the hospital encounter of 07/01/20 (from the past 24 hour(s))  Basic metabolic panel     Status: Abnormal   Collection Time: 07/01/20  2:18 PM  Result Value Ref Range   Sodium 136 135 - 145 mmol/L   Potassium 3.7 3.5 - 5.1 mmol/L   Chloride 99 98 - 111 mmol/L   CO2 23 22 - 32 mmol/L   Glucose, Bld 110 (H) 70 - 99 mg/dL   BUN 11 8 - 23 mg/dL   Creatinine, Ser 7.85 0.61 - 1.24 mg/dL   Calcium 9.6 8.9 - 88.5 mg/dL   GFR, Estimated >02 >77 mL/min   Anion gap 14 5 - 15  CBC     Status: None   Collection Time: 07/01/20  2:18 PM  Result Value Ref Range   WBC 7.2 4.0 - 10.5 K/uL   RBC 4.93 4.22 - 5.81 MIL/uL   Hemoglobin 15.8 13.0 - 17.0 g/dL   HCT 41.2 39 - 52 %   MCV 91.9 80.0 - 100.0 fL   MCH 32.0 26.0 - 34.0 pg   MCHC 34.9 30.0 - 36.0 g/dL   RDW 87.8 67.6 - 72.0 %   Platelets 213 150 - 400 K/uL   nRBC 0.0 0.0 - 0.2 %   ____________________________________________  EKG My review and personal interpretation at Time: 14:14   Indication: cough  Rate: 85  Rhythm: sinus Axis: left Other: normal intervals, no stemi, no  depressions ____________________________________________  RADIOLOGY  I personally reviewed all radiographic images ordered to evaluate for the above acute complaints and reviewed radiology reports and findings.  These findings were personally discussed with the patient.  Please see medical record for radiology report.  ____________________________________________   PROCEDURES  Procedure(s) performed:  Procedures    Critical Care performed: no ____________________________________________   INITIAL IMPRESSION / ASSESSMENT AND PLAN / ED COURSE  Pertinent labs & imaging results that were available during my care of the patient were reviewed by me and considered in my medical decision making (see chart for details).   DDX: Pneumonia, URI, Covid, COPD, emphysema, CHF  Bryan Doo. is a 64 y.o. who presents to the ED with presentation as described above.  Is not having any findings suggest CHF.  No consolidation to suggest pneumonia some symptoms are concerning for Covid is not febrile not hypoxic no white count.  Will test for Covid in particular after recent Thanksgiving holidays.  His presentation is more consistent with bronchitis and worsening congestion therefore will give Z-Pak.  Will give additional prednisone taper.  Is not consistent with ACS, PE.  He is very well-appearing clinically.  Have discussed with the patient and available family all diagnostics and treatments performed thus far and all questions were answered to the best of my ability. The patient demonstrates understanding and agreement with plan.      The patient was evaluated in Emergency Department today for the symptoms described in the history of present illness. He/she was evaluated in the context of the global COVID-19 pandemic, which necessitated consideration that the patient might be at risk for infection with the SARS-CoV-2 virus that causes COVID-19. Institutional protocols and algorithms that pertain  to the evaluation of patients at risk for COVID-19 are in a state of rapid change based on information released by regulatory bodies including the CDC and federal and state organizations. These policies and algorithms  were followed during the patient's care in the ED.  As part of my medical decision making, I reviewed the following data within the electronic MEDICAL RECORD NUMBER Nursing notes reviewed and incorporated, Labs reviewed, notes from prior ED visits and  Controlled Substance Database   ____________________________________________   FINAL CLINICAL IMPRESSION(S) / ED DIAGNOSES  Final diagnoses:  Bronchitis  Cough      NEW MEDICATIONS STARTED DURING THIS VISIT:  New Prescriptions   AZITHROMYCIN (ZITHROMAX) 500 MG TABLET    Take 1 tablet (500 mg total) by mouth daily for 3 days. Take 1 tablet daily for 3 days.   PREDNISONE (DELTASONE) 10 MG TABLET    Take 1 tablet (10 mg total) by mouth daily. Day 1-2: Take 50 mg  ( 5 pills) Day 3-4 : Take 40 mg (4pills) Day 5-6: Take 30 mg (3 pills) Day 7-8:  Take 20 mg (2 pills) Day 9:  Take 10mg  (1 pill)     Note:  This document was prepared using Dragon voice recognition software and may include unintentional dictation errors.    Willy Eddyobinson, , MD 07/01/20 [phone]

## 2020-07-02 ENCOUNTER — Telehealth: Payer: Self-pay

## 2020-07-02 NOTE — Telephone Encounter (Signed)
Transition Care Management Unsuccessful Follow-up Telephone Call  Date of discharge and from where:  07/01/2020 [facility] ED  Attempts:  1st Attempt  Reason for unsuccessful TCM follow-up call:  Left voice message

## 2020-07-05 NOTE — Telephone Encounter (Signed)
Transition Care Management Unsuccessful Follow-up Telephone Call  Date of discharge and from where:  07/01/2020 [facility] ED   Attempts:  2nd Attempt  Reason for unsuccessful TCM follow-up call:  Left voice message

## 2020-07-06 NOTE — Telephone Encounter (Signed)
Transition Care Management Follow-up Telephone Call  Date of discharge and from where: 07/01/2020 [facility] ED  How have you been since you were released from the hospital? Patient states that he is feeling better since he has been home. He completed the abx but did not receive the steroid dose pack. Notes indicate it was printed but pt stated that it was not in his paper work.   Any questions or concerns? Yes, Pt states that he is feeling better but is still coughing some. Informed pt about the results from the respiratory panel.   Items Reviewed:  Did the pt receive and understand the discharge instructions provided? Yes   Medications obtained and verified? Yes   Other? No   Any new allergies since your discharge? No   Dietary orders reviewed? N/A  Do you have support at home? Yes   Functional Questionnaire: (I = Independent and D = Dependent) ADLs: I  Bathing/Dressing- I  Meal Prep- I  Eating- I  Maintaining continence- I  Transferring/Ambulation- I  Managing Meds- I  Follow up appointments reviewed:   PCP Hospital f/u appt confirmed? No  Pt informed that since he did not get the steriods he would need an appt with PCP. It would appropriate for her to evaluate the lengering symptoms that he is having.   Are transportation arrangements needed? No   If their condition worsens, is the pt aware to call PCP or go to the Emergency Dept.? Yes  Was the patient provided with contact information for the PCP's office or ED? Yes  Was to pt encouraged to call back with questions or concerns? Yes

## 2020-08-04 ENCOUNTER — Ambulatory Visit: Payer: Self-pay | Admitting: *Deleted

## 2020-08-04 NOTE — Telephone Encounter (Signed)
Needs to go to ER.  If symptoms were present on our virtual OV as the triage nurse describes, I would recommend ER evaluation as well.

## 2020-08-04 NOTE — Telephone Encounter (Signed)
Appt rescheduled to tomorrow Thursday, Jan 6th. Pt wife state his symptoms are not bad enough that he need to be seen in ER. She state that he only experienced the dizziness once and it subsided.

## 2020-08-04 NOTE — Telephone Encounter (Addendum)
I returned pt's call.   I had a hard time at first understanding what he was saying.   He had me on speaker phone and there was a lot of noise in the background cutting the call in and out.  I asked him to go to some place without the noise which he did and the call was much better.  He is covid positive.  He did a home test yesterday afternoon.  He is short of breath, having a terrible dry cough, runny nose, muscle aches, no taste/smell, no fever.   He started having diarrhea last night.  He is also dizzy when he stands up from sitting or lying.   He stands there a minute then he is fine.  During the call I could hear he is short of breath.  He is high risk.   He had a heart attack 3 yrs ago and had a stent put in his main artery in his heart.  He also uses 2 inhalers since having the heart attack but they are not helping much right now.  He has COPD.  He was seen in the ED at [facility] by Nicole's recommendation on 07/01/2020 for some of these same respiratory symptoms but he has not improved.   He was given a Z pak and steroids.  He took the Z pak but the Rx for the steroids got messed up so he never took the steroids.  He is requesting his virtual visit appt that he has on 08/06/2020 be moved up sooner.   Since he has been to the ED he really does not want to go back.   "It's a long wait and you are around all these sick people".  "I really do not want to go back".   "Is there anything Joni Reining can recommend or do other than the ED".   I let him know with his symptoms he really needed to return to the ED however at his request I'm sending a high priority note to [facility] for [facility] and for a follow up call with further directions.  I went over the care advice most of which he is already doing or has done.  He asked about the infusion that they are giving for covid to high risk pts.   I let him know to talk with Joni Reining regarding this.  I instructed him to call 911 if his  shortness of breath became worse or he developed any chest pain/discomfort/pressure or his symptoms became worse.  He verbalized understanding of my instructions and was agreeable to calling if needed.       See notes below.   He is drinking plenty of fluids.  I sent my notes to the office high priority. I attempted to call the office however they are closed for lunch.  I called the office back to verify they had gotten my message regarding this pt.   I spoke with Fleet Contras who is going to give my notes to Joni Reining and see what she advices probably that he go back to the ED.   I thanked her for checking.  Reason for Disposition . Patient sounds very sick or weak to the triager    Covid positive.  High risk:  Had a stent put in his heart 3 yrs ago after having a heart attack.   Has been to the ED does not want to go back.   Is having shortness of breath.  Answer Assessment - Initial Assessment  Questions 1. COVID-19 DIAGNOSIS: "Who made your COVID-19 diagnosis?" "Was it confirmed by a positive lab test?" If not diagnosed by a HCP, ask "Are there lots of cases (community spread) where you live?" Note: See public health department website, if unsure.     He is positive for covid 19 via home test.   5:00 PM yesterday afternoon. 2. COVID-19 EXPOSURE: "Was there any known exposure to COVID before the symptoms began?" CDC Definition of close contact: within 6 feet (2 meters) for a total of 15 minutes or more over a 24-hour period.      I was around people with covid 3. ONSET: "When did the COVID-19 symptoms start?"      Last Friday. 4. WORST SYMPTOM: "What is your worst symptom?" (e.g., cough, fever, shortness of breath, muscle aches)     I use inhalers and have a stent in my heart.   5. COUGH: "Do you have a cough?" If Yes, ask: "How bad is the cough?"       I'm coughing a lot a dry cough.  I'm coughing a dry cough 6. FEVER: "Do you have a fever?" If Yes, ask: "What is your temperature, how was it  measured, and when did it start?"     No fever 7. RESPIRATORY STATUS: "Describe your breathing?" (e.g., shortness of breath, wheezing, unable to speak)      I'm very short of breath.  Recently I had pneumonia and was on the Z pak and steroids.    (The call is cutting in and out due to noise in the background he went to a better place and it was better. I was sent to the ED.   I never took the steroids because the Rx got mixed up. 8. BETTER-SAME-WORSE: "Are you getting better, staying the same or getting worse compared to yesterday?"  If getting worse, ask, "In what way?"     Worse 9. HIGH RISK DISEASE: "Do you have any chronic medical problems?" (e.g., asthma, heart or lung disease, weak immune system, obesity, etc.)     Yes.   I use 2 inhalers since my heart attack.   I have a stent in my heart from 3 years ago.   I'm very short of breath.    When I get up I'm very dizzy.   I am better after being up a minute. 10. VACCINE: "Have you gotten the COVID-19 vaccine?" If Yes ask: "Which one, how many shots, when did you get it?"       I've both covid shots.   11. PREGNANCY: "Is there any chance you are pregnant?" "When was your last menstrual period?"       N/A 12. OTHER SYMPTOMS: "Do you have any other symptoms?"  (e.g., chills, fatigue, headache, loss of smell or taste, muscle pain, sore throat; new loss of smell or taste especially support the diagnosis of COVID-19)       Shortness of breath, congestion in chest, nose running, headache, restless and my joints ache, no taste or smell, dry cough.   Diarrhea started last night.  I'm drinking plenty of fluids.  I'm urinating regularly.  My urine is pale yellow so I'm trying to keep my fluids going.  Protocols used: CORONAVIRUS (COVID-19) DIAGNOSED OR SUSPECTED-A-AH

## 2020-08-05 ENCOUNTER — Ambulatory Visit (INDEPENDENT_AMBULATORY_CARE_PROVIDER_SITE_OTHER): Payer: Medicaid Other | Admitting: Family Medicine

## 2020-08-05 ENCOUNTER — Encounter: Payer: Self-pay | Admitting: Family Medicine

## 2020-08-05 ENCOUNTER — Other Ambulatory Visit: Payer: Self-pay

## 2020-08-05 DIAGNOSIS — U071 COVID-19: Secondary | ICD-10-CM | POA: Diagnosis not present

## 2020-08-05 DIAGNOSIS — J4530 Mild persistent asthma, uncomplicated: Secondary | ICD-10-CM

## 2020-08-05 MED ORDER — BENZONATATE 100 MG PO CAPS: 100.0000 mg | ORAL_CAPSULE | Freq: Two times a day (BID) | ORAL | 0 refills | Status: DC | PRN

## 2020-08-05 MED ORDER — GUAIFENESIN ER 600 MG PO TB12: 1200.0000 mg | ORAL_TABLET | Freq: Two times a day (BID) | ORAL | 0 refills | Status: AC

## 2020-08-05 MED ORDER — FLUTICASONE-SALMETEROL 100-50 MCG/DOSE IN AEPB: INHALATION_SPRAY | RESPIRATORY_TRACT | 1 refills | Status: DC

## 2020-08-05 MED ORDER — PREDNISONE 50 MG PO TABS: ORAL_TABLET | ORAL | 0 refills | Status: DC

## 2020-08-05 MED ORDER — PROMETHAZINE-DM 6.25-15 MG/5ML PO SYRP: 5.0000 mL | ORAL_SOLUTION | Freq: Four times a day (QID) | ORAL | 0 refills | Status: DC | PRN

## 2020-08-05 MED ORDER — ALBUTEROL SULFATE HFA 108 (90 BASE) MCG/ACT IN AERS: INHALATION_SPRAY | RESPIRATORY_TRACT | 2 refills | Status: DC

## 2020-08-05 NOTE — Assessment & Plan Note (Signed)
Positive COVID home test 2 days ago with symptoms starting 4 days ago.  High risk patient with recent cardiac stenting.  Sending in Rx for prednisone 50mg  to take 1 tablet daily x 5 days, albuterol HFA 1-2 puffs every 4-6 hours as needed for cough and SOB, promethazine DM 102mL every 4-6 hours as needed for nighttime cough, tessalon perles 100mg  BID for cough, and mucinex 1200mg  BID x 10 days.  Sent referral to [facility] infusion clinic.  Discussed if unable to get into [facility] infusion clinic, provided with contact information for antibody injections with [facility] in [place].  Strict ER precautions provided.

## 2020-08-05 NOTE — Progress Notes (Signed)
Virtual Visit via Telephone  The purpose of this virtual visit is to provide medical care while limiting exposure to the novel coronavirus (COVID19) for both patient and office staff.  Consent was obtained for phone visit:  Yes.   Answered questions that patient had about telehealth interaction:  Yes.   I discussed the limitations, risks, security and privacy concerns of performing an evaluation and management service by telephone. I also discussed with the patient that there may be a patient responsible charge related to this service. The patient expressed understanding and agreed to proceed.  Patient is at home and is accessed via telephone Services are provided by Charlaine Dalton, FNP-C from [facility])  ---------------------------------------------------------------------- Chief Complaint  Patient presents with  . Covid Positive    Positive home COVID test x 2 days ago. Dizziness, mild headache, nasal congestion, sneezing, nasal discomfort, bodyaches, diarrhea, lack of appetite, watery eyes, loss of taste and smell and productive coughing. X 4 days       S: Reviewed CMA documentation. I have called patient and gathered additional HPI as follows:  Bryan Ibarra presents for virtual telemedicine visit to discuss positive COVID testing.  Reports positive COVID test approximately 2 days ago with symptoms of dizziness, mild headache, nasal congestion, sneezing, nasal discomfort, body aches, diarrhea, lack of appetite, watery eyes with loss of taste/smell and productive cough x 4 days.  No known COVID vaccination.  Denies fevers, DOE, CP, SOB, abdominal pain, nausea or vomiting.  Has not taken anything that has helped with his symptoms.  Patient is currently home Denies any high risk travel to areas of current concern for COVID19. Denies any known or suspected exposure to person with or possibly with COVID19.  Past Medical History:  Diagnosis Date  . Alcohol abuse     a. up to a 12 pack of beer/day.  . Arthritis   . CAD in native artery    a. LHC 6/18: severe 1 vessel CAD with 90% thrombotic stenosis in the proximal LAD which was felt to be the culprit for the patient's non-STEMI. He underwent successful PCI/DES with a resolute onyx drug-eluting stent (3.0 x 18 mm). There was 0% residual stenosis. LVEF estimated at 55-65% by visual estimate. LVEDP was mildly elevated  . H/O echocardiogram    a. echo 6/18: EF 60-65%, normal wall motion, grade 1 diastolic dysfunction, RV cavity size was normal with normal wall thickness and normal RV systolic function. There were no significant valvular abnormalities  . Hearing loss   . Heart attack (HCC)   . Hypertension   . Marijuana abuse    a. occasional.  . Tobacco abuse    Social History   Tobacco Use  . Smoking status: Current Every Day Smoker    Packs/day: 0.25    Years: 42.00    Pack years: 10.50    Types: Cigarettes  . Smokeless tobacco: Never Used  . Tobacco comment: smokes 5 cigarettes daily.   Vaping Use  . Vaping Use: Former  Substance Use Topics  . Alcohol use: Yes    Comment: 12 beers per week  . Drug use: Not Currently    Types: Marijuana    Comment: occasional MJ.  Remote cocaine.    Current Outpatient Medications:  .  albuterol (PROAIR HFA) 108 (90 Base) MCG/ACT inhaler, INHALE 1-2 PUFFS INTO LUNGS EVERY 6 HOURS AS NEEDED FOR WHEEZING OR SHORTNESS OF BREATH, Disp: 8.5 g, Rfl: 2 .  amLODipine (NORVASC) 5 MG tablet,  Take 1 tablet (5 mg total) by mouth daily., Disp: 90 tablet, Rfl: 0 .  aspirin EC 81 MG tablet, Take 1 tablet (81 mg total) by mouth daily. Hold for 3 days., Disp: 30 tablet, Rfl: 0 .  benzonatate (TESSALON) 100 MG capsule, Take 1 capsule (100 mg total) by mouth 2 (two) times daily as needed for cough., Disp: 20 capsule, Rfl: 0 .  carvedilol (COREG) 6.25 MG tablet, TAKE 1 TABLET(6.25 MG) BY MOUTH TWICE DAILY WITH A MEAL, Disp: 180 tablet, Rfl: 0 .  guaiFENesin (MUCINEX) 600 MG  12 hr tablet, Take 2 tablets (1,200 mg total) by mouth 2 (two) times daily for 10 days., Disp: 40 tablet, Rfl: 0 .  lisinopril (ZESTRIL) 40 MG tablet, TAKE 1 TABLET(40 MG) BY MOUTH DAILY, Disp: 90 tablet, Rfl: 0 .  Melatonin 10 MG TABS, Take 2 tablets by mouth daily., Disp: , Rfl:  .  predniSONE (DELTASONE) 50 MG tablet, Take 1 tablet daily x 5 days, Disp: 5 tablet, Rfl: 0 .  promethazine-dextromethorphan (PROMETHAZINE-DM) 6.25-15 MG/5ML syrup, Take 5 mLs by mouth 4 (four) times daily as needed for cough., Disp: 118 mL, Rfl: 0 .  rosuvastatin (CRESTOR) 10 MG tablet, TAKE 1 TABLET(10 MG) BY MOUTH DAILY, Disp: 90 tablet, Rfl: 0 .  Fluticasone-Salmeterol (ADVAIR DISKUS) 100-50 MCG/DOSE AEPB, INHALE 1 PUFF INTO THE LUNGS TWICE DAILY, Disp: 60 each, Rfl: 1  [facility]  Decreased Interest 0  Down, Depressed, Hopeless 0  PHQ - 2 Score 0  Altered sleeping 2  Tired, decreased energy 0  Change in appetite 0  Feeling bad or failure about yourself  0  Trouble concentrating 1  Moving slowly or fidgety/restless 0  Suicidal thoughts 0  PHQ-9 Score 3  Difficult doing work/chores Not difficult at all    No flowsheet data found.  -------------------------------------------------------------------------- O: No physical exam performed due to remote telephone encounter.  Physical Exam: Patient remotely monitored without video.  Verbal communication appropriate.  Cognition normal.  Recent Results (from the past 2160 hour(s))  Basic metabolic panel     Status: Abnormal   Collection Time: 06/11/20  9:07 AM  Result Value Ref Range   Sodium 141 135 - 145 mmol/L   Potassium 4.2 3.5 - 5.1 mmol/L   Chloride 105 98 - 111 mmol/L   CO2 25 22 - 32 mmol/L   Glucose, Bld 102 (H) 70 - 99 mg/dL    Comment: Glucose reference range applies only to samples taken after fasting for at least 8 hours.   BUN 11 8 - 23 mg/dL   Creatinine, Ser 1.611.04 0.61 - 1.24 mg/dL   Calcium 9.3 8.9 - 09.610.3 mg/dL    GFR, Estimated >04>60 >54>60 mL/min    Comment: (NOTE) Calculated using the CKD-EPI Creatinine Equation (2021)    Anion gap 11 5 - 15    Comment: Performed at [facility], [address] [place], [place] [address]  CBC     Status: None   Collection Time: 06/11/20  9:07 AM  Result Value Ref Range   WBC 6.0 4.0 - 10.5 K/uL   RBC 4.77 4.22 - 5.81 MIL/uL   Hemoglobin 15.5 13.0 - 17.0 g/dL   HCT 91.445.1 78.239.0 - 95.652.0 %   MCV 94.5 80.0 - 100.0 fL   MCH 32.5 26.0 - 34.0 pg   MCHC 34.4 30.0 - 36.0 g/dL   RDW 21.313.0 08.611.5 - 57.815.5 %   Platelets 227 150 - 400 K/uL  nRBC 0.0 0.0 - 0.2 %    Comment: Performed at [facility], [place], [place] [address]  Troponin I (High Sensitivity)     Status: None   Collection Time: 06/11/20  9:07 AM  Result Value Ref Range   Troponin I (High Sensitivity) 4 <18 ng/L    Comment: (NOTE) Elevated high sensitivity troponin I (hsTnI) values and significant  changes across serial measurements may suggest ACS but many other  chronic and acute conditions are known to elevate hsTnI results.  Refer to the "Links" section for chest pain algorithms and additional  guidance. Performed at [facility], [address] [place], [place] [address]   Brain natriuretic peptide     Status: None   Collection Time: 06/11/20  9:07 AM  Result Value Ref Range   B Natriuretic Peptide 78.7 0.0 - 100.0 pg/mL    Comment: Performed at [facility], [address] [place], [place] [address]  Respiratory Panel by RT PCR (Flu A&B, Covid) - Nasopharyngeal Swab     Status: None   Collection Time: 06/11/20 10:45 AM   Specimen: Nasopharyngeal Swab  Result Value Ref Range   SARS Coronavirus 2 by RT PCR NEGATIVE NEGATIVE    Comment: (NOTE) SARS-CoV-2 target nucleic acids are NOT DETECTED.  The SARS-CoV-2 RNA is generally detectable in upper respiratoy specimens during the acute phase of infection. The lowest concentration of SARS-CoV-2  viral copies this assay can detect is 131 copies/mL. A negative result does not preclude SARS-Cov-2 infection and should not be used as the sole basis for treatment or other patient management decisions. A negative result may occur with  improper specimen collection/handling, submission of specimen other than nasopharyngeal swab, presence of viral mutation(s) within the areas targeted by this assay, and inadequate number of viral copies (<131 copies/mL). A negative result must be combined with clinical observations, patient history, and epidemiological information. The expected result is Negative.  Fact Sheet for Patients:  PinkCheek.be  Fact Sheet for Healthcare Providers:  GravelBags.it  This test is no t yet approved or cleared by the [place] FDA and  has been authorized for detection and/or diagnosis of SARS-CoV-2 by FDA under an Emergency Use Authorization (EUA). This EUA will remain  in effect (meaning this test can be used) for the duration of the COVID-19 declaration under Section 564(b)(1) of the Act, 21 U.S.C. section 360bbb-3(b)(1), unless the authorization is terminated or revoked sooner.     Influenza A by PCR NEGATIVE NEGATIVE   Influenza B by PCR NEGATIVE NEGATIVE    Comment: (NOTE) The Xpert Xpress SARS-CoV-2/FLU/RSV assay is intended as an aid in  the diagnosis of influenza from Nasopharyngeal swab specimens and  should not be used as a sole basis for treatment. Nasal washings and  aspirates are unacceptable for Xpert Xpress SARS-CoV-2/FLU/RSV  testing.  Fact Sheet for Patients: PinkCheek.be  Fact Sheet for Healthcare Providers: GravelBags.it  This test is not yet approved or cleared by the [place] FDA and  has been authorized for detection and/or diagnosis of SARS-CoV-2 by  FDA under an Emergency Use Authorization (EUA). This EUA  will remain  in effect (meaning this test can be used) for the duration of the  Covid-19 declaration under Section 564(b)(1) of the Act, 21  U.S.C. section 360bbb-3(b)(1), unless the authorization is  terminated or revoked. Performed at [facility], [address], [place], [place] [address]   Troponin I (High Sensitivity)  Status: None   Collection Time: 06/11/20 10:45 AM  Result Value Ref Range   Troponin I (High Sensitivity) 4 <18 ng/L    Comment: (NOTE) Elevated high sensitivity troponin I (hsTnI) values and significant  changes across serial measurements may suggest ACS but many other  chronic and acute conditions are known to elevate hsTnI results.  Refer to the "Links" section for chest pain algorithms and additional  guidance. Performed at [facility], [address] [place], [place] [address]   Basic metabolic panel     Status: Abnormal   Collection Time: 07/01/20  2:18 PM  Result Value Ref Range   Sodium 136 135 - 145 mmol/L   Potassium 3.7 3.5 - 5.1 mmol/L   Chloride 99 98 - 111 mmol/L   CO2 23 22 - 32 mmol/L   Glucose, Bld 110 (H) 70 - 99 mg/dL    Comment: Glucose reference range applies only to samples taken after fasting for at least 8 hours.   BUN 11 8 - 23 mg/dL   Creatinine, Ser 7.34 0.61 - 1.24 mg/dL   Calcium 9.6 8.9 - 19.3 mg/dL   GFR, Estimated >79 >02 mL/min    Comment: (NOTE) Calculated using the CKD-EPI Creatinine Equation (2021)    Anion gap 14 5 - 15    Comment: Performed at [facility], [address] [place], [place] [address]  CBC     Status: None   Collection Time: 07/01/20  2:18 PM  Result Value Ref Range   WBC 7.2 4.0 - 10.5 K/uL   RBC 4.93 4.22 - 5.81 MIL/uL   Hemoglobin 15.8 13.0 - 17.0 g/dL   HCT 53.2 99.2 - 42.6 %   MCV 91.9 80.0 - 100.0 fL   MCH 32.0 26.0 - 34.0 pg   MCHC 34.9 30.0 - 36.0 g/dL   RDW 83.4 19.6 - 22.2 %   Platelets 213 150 - 400 K/uL   nRBC 0.0 0.0 - 0.2 %    Comment:  Performed at [facility], [address], [place], [place] [address]  Resp Panel by RT-PCR (Flu A&B, Covid) Nasopharyngeal Swab     Status: None   Collection Time: 07/01/20  5:50 PM   Specimen: Nasopharyngeal Swab; Nasopharyngeal(NP) swabs in vial transport medium  Result Value Ref Range   SARS Coronavirus 2 by RT PCR NEGATIVE NEGATIVE    Comment: (NOTE) SARS-CoV-2 target nucleic acids are NOT DETECTED.  The SARS-CoV-2 RNA is generally detectable in upper respiratory specimens during the acute phase of infection. The lowest concentration of SARS-CoV-2 viral copies this assay can detect is 138 copies/mL. A negative result does not preclude SARS-Cov-2 infection and should not be used as the sole basis for treatment or other patient management decisions. A negative result may occur with  improper specimen collection/handling, submission of specimen other than nasopharyngeal swab, presence of viral mutation(s) within the areas targeted by this assay, and inadequate number of viral copies(<138 copies/mL). A negative result must be combined with clinical observations, patient history, and epidemiological information. The expected result is Negative.  Fact Sheet for Patients:  BloggerCourse.com  Fact Sheet for Healthcare Providers:  SeriousBroker.it  This test is no t yet approved or cleared by the [place] FDA and  has been authorized for detection and/or diagnosis of SARS-CoV-2 by FDA under an Emergency Use Authorization (EUA). This EUA will remain  in effect (meaning this test can be used) for the duration of the COVID-19 declaration under Section 564(b)(1)  of the Act, 21 U.S.C.section 360bbb-3(b)(1), unless the authorization is terminated  or revoked sooner.       Influenza A by PCR NEGATIVE NEGATIVE   Influenza B by PCR NEGATIVE NEGATIVE    Comment: (NOTE) The Xpert Xpress SARS-CoV-2/FLU/RSV plus assay is intended  as an aid in the diagnosis of influenza from Nasopharyngeal swab specimens and should not be used as a sole basis for treatment. Nasal washings and aspirates are unacceptable for Xpert Xpress SARS-CoV-2/FLU/RSV testing.  Fact Sheet for Patients: BloggerCourse.com  Fact Sheet for Healthcare Providers: SeriousBroker.it  This test is not yet approved or cleared by the [place] FDA and has been authorized for detection and/or diagnosis of SARS-CoV-2 by FDA under an Emergency Use Authorization (EUA). This EUA will remain in effect (meaning this test can be used) for the duration of the COVID-19 declaration under Section 564(b)(1) of the Act, 21 U.S.C. section 360bbb-3(b)(1), unless the authorization is terminated or revoked.  Performed at [facility], [address] [place], [place] [address]     -------------------------------------------------------------------------- A&P:  Problem List Items Addressed This Visit      Respiratory   Asthma   Relevant Medications   predniSONE (DELTASONE) 50 MG tablet   albuterol (PROAIR HFA) 108 (90 Base) MCG/ACT inhaler   Fluticasone-Salmeterol (ADVAIR DISKUS) 100-50 MCG/DOSE AEPB     Other   COVID-19 - Primary    Positive COVID home test 2 days ago with symptoms starting 4 days ago.  High risk patient with recent cardiac stenting.  Sending in Rx for prednisone 50mg  to take 1 tablet daily x 5 days, albuterol HFA 1-2 puffs every 4-6 hours as needed for cough and SOB, promethazine DM 71mL every 4-6 hours as needed for nighttime cough, tessalon perles 100mg  BID for cough, and mucinex 1200mg  BID x 10 days.  Sent referral to [facility] infusion clinic.  Discussed if unable to get into [facility] infusion clinic, provided with contact information for antibody injections with [facility] in [place].  Strict ER precautions provided.      Relevant Medications   predniSONE (DELTASONE) 50 MG tablet    albuterol (PROAIR HFA) 108 (90 Base) MCG/ACT inhaler   guaiFENesin (MUCINEX) 600 MG 12 hr tablet   promethazine-dextromethorphan (PROMETHAZINE-DM) 6.25-15 MG/5ML syrup   benzonatate (TESSALON) 100 MG capsule      Meds ordered this encounter  Medications  . predniSONE (DELTASONE) 50 MG tablet    Sig: Take 1 tablet daily x 5 days    Dispense:  5 tablet    Refill:  0  . albuterol (PROAIR HFA) 108 (90 Base) MCG/ACT inhaler    Sig: INHALE 1-2 PUFFS INTO LUNGS EVERY 6 HOURS AS NEEDED FOR WHEEZING OR SHORTNESS OF BREATH    Dispense:  8.5 g    Refill:  2  . Fluticasone-Salmeterol (ADVAIR DISKUS) 100-50 MCG/DOSE AEPB    Sig: INHALE 1 PUFF INTO THE LUNGS TWICE DAILY    Dispense:  60 each    Refill:  1  . guaiFENesin (MUCINEX) 600 MG 12 hr tablet    Sig: Take 2 tablets (1,200 mg total) by mouth 2 (two) times daily for 10 days.    Dispense:  40 tablet    Refill:  0  . promethazine-dextromethorphan (PROMETHAZINE-DM) 6.25-15 MG/5ML syrup    Sig: Take 5 mLs by mouth 4 (four) times daily as needed for cough.    Dispense:  118 mL    Refill:  0  . benzonatate (TESSALON) 100 MG capsule  Sig: Take 1 capsule (100 mg total) by mouth 2 (two) times daily as needed for cough.    Dispense:  20 capsule    Refill:  0    Follow-up: - Return if symptoms worsen or fail to improve - Strict ER precautions  Patient verbalizes understanding with the above medical recommendations including the limitation of remote medical advice.  Specific follow-up and call-back criteria were given for patient to follow-up or seek medical care more urgently if needed.  - Time spent in direct consultation with patient on phone: 9 minutes  Charlaine Dalton, FNP-C [facility] 08/05/2020, 3:57 PM

## 2020-08-06 ENCOUNTER — Telehealth: Payer: Medicaid Other | Admitting: Family Medicine

## 2020-09-01 ENCOUNTER — Other Ambulatory Visit: Payer: Self-pay | Admitting: Family Medicine

## 2020-09-01 DIAGNOSIS — J4530 Mild persistent asthma, uncomplicated: Secondary | ICD-10-CM

## 2020-09-01 MED ORDER — FLUTICASONE-SALMETEROL 100-50 MCG/DOSE IN AEPB: INHALATION_SPRAY | RESPIRATORY_TRACT | 1 refills | Status: DC

## 2020-09-01 NOTE — Telephone Encounter (Signed)
Medication Refill - Medication:  Fluticasone-Salmeterol (ADVAIR DISKUS) 100-50 MCG/DOSE AEPB 60 each 1 08/05/2020    Sig: INHALE 1 PUFF INTO THE LUNGS TWICE DAILY   Sent to pharmacy as: Fluticasone-Salmeterol (ADVAIR DISKUS) 100-50 MCG/DOSE Aerosol Powder, Breath Activtivatede   E-Prescribing Status: Receipt confirmed by pharmacy (08/05/2020 3:23 PM EST)    [id number] [place], [place] - [address] AT [facility] OF [address] [facility] Phone:  [phone]  Fax:  [phone]    Please resend!!! [facility] is stating they did not receive.    Associated Diagnoses     Has the patient contacted their pharmacy (Agent: If no, request that the patient contact the pharmacy for the refill.) (Agent: If yes, when and what did the pharmacy advise?)  Preferred Pharmacy (with phone number or street name)  Agent: Please be advised that RX refills may take up to 3 business days. We ask that you follow-up with your pharmacy.

## 2020-09-01 NOTE — Telephone Encounter (Signed)
Attempted to call pharmacy- could not get staff on the phone- Rx resent to pharmacy

## 2020-09-09 ENCOUNTER — Other Ambulatory Visit: Payer: Self-pay | Admitting: Cardiovascular Disease

## 2020-09-09 ENCOUNTER — Telehealth: Payer: Self-pay | Admitting: Cardiovascular Disease

## 2020-09-09 NOTE — Telephone Encounter (Signed)
  Patient Consent for Virtual Visit         [name]. has provided verbal consent on 09/09/2020 for a virtual visit (video or telephone).   CONSENT FOR VIRTUAL VISIT FOR:  Bryan Ibarra.  By participating in this virtual visit I agree to the following:  I hereby voluntarily request, consent and authorize [facility] and its employed or contracted physicians, physician assistants, nurse practitioners or other licensed health care professionals (the Practitioner), to provide me with telemedicine health care services (the "Services") as deemed necessary by the treating Practitioner. I acknowledge and consent to receive the Services by the Practitioner via telemedicine. I understand that the telemedicine visit will involve communicating with the Practitioner through live audiovisual communication technology and the disclosure of certain medical information by electronic transmission. I acknowledge that I have been given the opportunity to request an in-person assessment or other available alternative prior to the telemedicine visit and am voluntarily participating in the telemedicine visit.  I understand that I have the right to withhold or withdraw my consent to the use of telemedicine in the course of my care at any time, without affecting my right to future care or treatment, and that the Practitioner or I may terminate the telemedicine visit at any time. I understand that I have the right to inspect all information obtained and/or recorded in the course of the telemedicine visit and may receive copies of available information for a reasonable fee.  I understand that some of the potential risks of receiving the Services via telemedicine include:  Marland Kitchen Delay or interruption in medical evaluation due to technological equipment failure or disruption; . Information transmitted may not be sufficient (e.g. poor resolution of images) to allow for appropriate medical decision making by the  Practitioner; and/or  . In rare instances, security protocols could fail, causing a breach of personal health information.  Furthermore, I acknowledge that it is my responsibility to provide information about my medical history, conditions and care that is complete and accurate to the best of my ability. I acknowledge that Practitioner's advice, recommendations, and/or decision may be based on factors not within their control, such as incomplete or inaccurate data provided by me or distortions of diagnostic images or specimens that may result from electronic transmissions. I understand that the practice of medicine is not an exact science and that Practitioner makes no warranties or guarantees regarding treatment outcomes. I acknowledge that a copy of this consent can be made available to me via my patient portal [facility] MyChart), or I can request a printed copy by calling the office of [facility].    I understand that my insurance will be billed for this visit.   I have read or had this consent read to me. . I understand the contents of this consent, which adequately explains the benefits and risks of the Services being provided via telemedicine.  . I have been provided ample opportunity to ask questions regarding this consent and the Services and have had my questions answered to my satisfaction. . I give my informed consent for the services to be provided through the use of telemedicine in my medical care

## 2020-09-09 NOTE — Telephone Encounter (Signed)
Scheduled for virtual 2/15

## 2020-09-09 NOTE — Telephone Encounter (Signed)
Spoke with wife states she will have him call back

## 2020-09-09 NOTE — Telephone Encounter (Signed)
Please reschedule F/U appointment. Patient cancelled last scheduled appointment. Thank you!

## 2020-09-13 NOTE — Progress Notes (Signed)
Virtual Visit via Telephone Note   This visit type was conducted due to national recommendations for restrictions regarding the COVID-19 Pandemic (e.g. social distancing) in an effort to limit this patient's exposure and mitigate transmission in our community.  Due to his co-morbid illnesses, this patient is at least at moderate risk for complications without adequate follow up.  This format is felt to be most appropriate for this patient at this time.  The patient did not have access to video technology/had technical difficulties with video requiring transitioning to audio format only (telephone).  All issues noted in this document were discussed and addressed.  No physical exam could be performed with this format.  Please refer to the patient's chart for his  consent to telehealth for [facility].    Date:  09/13/2020   ID:  Bryan Catalina., DOB 1956/03/17, MRN [id number] The patient was identified using 2 identifiers.  Patient Location: Home Provider Location: Office/Clinic   PCP:  Tarri Fuller, FNP   [facility]  Cardiologist:  Lorine Bears, MD  Advanced Practice Provider:  No care team member to display Electrophysiologist:  None   Evaluation Performed:  Follow-Up Visit  Chief Complaint:  Follow up of CAD, HTN  History of Present Illness:    Bryan Rodas. is a 65 y.o. male with coronary artery disease, hypertension, polysubstance abuse (tobacco, alcohol, marjuana, and remote history of cocaine use), COVID19 07/2020 not requiring hospitalization. He was last seen 12/2019 by Dr. Kirke Corin. He presents today for follow up of his coronary artery disease.   He had NSTEMI June 2018 and underwent PCI and DES to LAD without complications. Ejection fraction at the time was normal. He did not tolerate Atorvastatin due to myalgia.   Hospitalized January 2021 with severe epitaxis requiring surgery and cauterization. He was mildly hypoxic and thought to  have an element of COPD.  He was last seen by Dr. Kirke Corin 12/2019 doing well from cardiac perspective and encouraged to quit smoking.   He tested positive for COVID-19 08/03/20 symptomatic with dizziness, headache, congestion, body aches. No known COVID vaccination. He was treated with prednisone, albuterol, tessalon perles, muccinex.   Follow-up today via phone visit.  He reports no chest pain, pressure, tightness.  Reports some mild shortness of breath which is stable at his baseline.  Tells me it was mildly worsened after his COVID-19 infection which felt like a "bad pneumonia "but has since improved.  He reports good relief with albuterol as well as his Advair inhalers.  No formal exercise routine.  Endorses eating a low-sodium, heart healthy diet.  Reports blood pressure is elevated at home though at recent office visits over the last year blood pressure has been well controlled.  He takes his blood pressure medication routinely every evening.  The patient does not have symptoms concerning for COVID-19 infection (fever, chills, cough, or new shortness of breath).    Past Medical History:  Diagnosis Date  . Alcohol abuse    a. up to a 12 pack of beer/day.  . Arthritis   . CAD in native artery    a. LHC 6/18: severe 1 vessel CAD with 90% thrombotic stenosis in the proximal LAD which was felt to be the culprit for the patient's non-STEMI. He underwent successful PCI/DES with a resolute onyx drug-eluting stent (3.0 x 18 mm). There was 0% residual stenosis. LVEF estimated at 55-65% by visual estimate. LVEDP was mildly elevated  . H/O echocardiogram  a. echo 6/18: EF 60-65%, normal wall motion, grade 1 diastolic dysfunction, RV cavity size was normal with normal wall thickness and normal RV systolic function. There were no significant valvular abnormalities  . Hearing loss   . Heart attack (HCC)   . Hypertension   . Marijuana abuse    a. occasional.  . Tobacco abuse    Past Surgical History:   Procedure Laterality Date  . APPENDECTOMY    . CORONARY STENT INTERVENTION N/A 01/01/2017   Procedure: Coronary Stent Intervention;  Surgeon: Iran Ouch, MD;  Location: [facility] INVASIVE CV LAB;  Service: Cardiovascular;  Laterality: N/A;  . KNEE SURGERY    . LEFT HEART CATH AND CORONARY ANGIOGRAPHY N/A 01/01/2017   Procedure: Left Heart Cath and Coronary Angiography;  Surgeon: Iran Ouch, MD;  Location: [facility] INVASIVE CV LAB;  Service: Cardiovascular;  Laterality: N/A;  . NASAL ENDOSCOPY N/A 08/02/2019   Procedure: NASAL ENDOSCOPY;  Surgeon: Tamela Gammon, MD;  Location: [facility] ORS;  Service: ENT;  Laterality: N/A;  . NASAL HEMORRHAGE CONTROL N/A 08/02/2019   Procedure: EPISTAXIS CONTROL;  Surgeon: Tamela Gammon, MD;  Location: [facility] ORS;  Service: ENT;  Laterality: N/A;  . shunt heart attack       No outpatient medications have been marked as taking for the 09/14/20 encounter (Appointment) with Alver Sorrow, NP.     Allergies:   Patient has no known allergies.   Social History   Tobacco Use  . Smoking status: Current Every Day Smoker    Packs/day: 0.25    Years: 42.00    Pack years: 10.50    Types: Cigarettes  . Smokeless tobacco: Never Used  . Tobacco comment: smokes 5 cigarettes daily.   Vaping Use  . Vaping Use: Former  Substance Use Topics  . Alcohol use: Yes    Comment: 12 beers per week  . Drug use: Not Currently    Types: Marijuana    Comment: occasional MJ.  Remote cocaine.     Family Hx: The patient's family history includes CAD in his father and paternal grandfather; Colon polyps in his brother; Diabetes in his father.  ROS:   Please see the history of present illness.    All other systems reviewed and are negative.   Prior CV studies:   The following studies were reviewed today:  Echo 07/22/18 - Left ventricle: The cavity size was normal. Systolic function was    normal. The estimated ejection fraction was in the range of 55%    to 60%. Wall  motion was normal; there were no regional wall    motion abnormalities. Left ventricular diastolic function    parameters were normal.  - Pulmonary arteries: Systolic pressure could not be accurately    estimated.    Labs/Other Tests and Data Reviewed:    EKG:  No ECG reviewed.  Recent Labs: 01/30/2020: ALT 25; TSH 0.89 06/11/2020: B Natriuretic Peptide 78.7 07/01/2020: BUN 11; Creatinine, Ser 0.93; Hemoglobin 15.8; Platelets 213; Potassium 3.7; Sodium 136   Recent Lipid Panel Lab Results  Component Value Date/Time   CHOL 153 01/30/2020 11:15 AM   TRIG 142 01/30/2020 11:15 AM   HDL 76 01/30/2020 11:15 AM   CHOLHDL 2.0 01/30/2020 11:15 AM   LDLCALC 54 01/30/2020 11:15 AM   LDLDIRECT <4 06/24/2018 05:25 PM    Wt Readings from Last 3 Encounters:  07/01/20 235 lb (106.6 kg)  06/11/20 240 lb (108.9 kg)  03/30/20 247 lb 3.2 oz (112.1  kg)     Objective:    Vital Signs:  There were no vitals taken for this visit.   VITAL SIGNS:  reviewed  ASSESSMENT & PLAN:    1. CAD - Stable with no anginal symptoms.  No indication for ischemic evaluation at this time.  GDMT includes aspirin, Coreg, rosuvastatin.  Low-salt, heart healthy diet encouraged.  Recommend regular cardiovascular exercise.  2. COPD / Hx of COVID 19 / Tobacco use -continue to follow with primary care provider.  Continue Advair twice daily and albuterol as needed.  Reports mild increase in the shortness of breath at rest since COVID early January 2021 which did not require hospitalization. Shortness of breath occurs at rest and with activity and is not associated with chest pain.  Low suspicion angina, no indication for ischemic evaluation. Smoking cessation encouraged. Recommend utilization of 1800QUITNOW.  3. HTN -BP mildly elevated at home though in clinic visits over the last year has been well controlled.  Encouraged to continue home monitoring.  If blood pressure remains persistently elevated could consider increased  dose of amlodipine to 10 mg daily.  In the interim, we will continue amlodipine 5 mg daily, Coreg 6.25 mg twice daily, lisinopril 40 mg daily.  4. HLD, LDL goal <70 - Lipid panel 01/2020 with LDL 54. 01/2020 normal liver enzymes. Previous intolerance to atorvastatin with myalgias.  Tolerates Crestor 10 mg daily without difficulty, refill provided.   COVID-19 Education: The signs and symptoms of COVID-19 were discussed with the patient and how to seek care for testing (follow up with PCP or arrange E-visit).  The importance of social distancing was discussed today.  Time:   Today, I have spent 12 minutes with the patient with telehealth technology discussing the above problems.     Medication Adjustments/Labs and Tests Ordered: Current medicines are reviewed at length with the patient today.  Concerns regarding medicines are outlined above.   Tests Ordered: No orders of the defined types were placed in this encounter.   Medication Changes: No orders of the defined types were placed in this encounter.   Follow Up:  In Person in 6 month(s)  Signed, Alver Sorrow, NP  09/13/2020 5:58 PM    [facility]

## 2020-09-14 ENCOUNTER — Encounter: Payer: Self-pay | Admitting: Family

## 2020-09-14 ENCOUNTER — Other Ambulatory Visit: Payer: Self-pay

## 2020-09-14 ENCOUNTER — Telehealth (INDEPENDENT_AMBULATORY_CARE_PROVIDER_SITE_OTHER): Payer: Medicaid Other | Admitting: Family

## 2020-09-14 VITALS — BP 155/80 | Ht 74.0 in | Wt 235.0 lb

## 2020-09-14 DIAGNOSIS — I25118 Atherosclerotic heart disease of native coronary artery with other forms of angina pectoris: Secondary | ICD-10-CM

## 2020-09-14 DIAGNOSIS — E785 Hyperlipidemia, unspecified: Secondary | ICD-10-CM

## 2020-09-14 DIAGNOSIS — Z720 Tobacco use: Secondary | ICD-10-CM

## 2020-09-14 DIAGNOSIS — I10 Essential (primary) hypertension: Secondary | ICD-10-CM | POA: Diagnosis not present

## 2020-09-14 MED ORDER — CARVEDILOL 6.25 MG PO TABS: 6.2500 mg | ORAL_TABLET | Freq: Two times a day (BID) | ORAL | 1 refills | Status: DC

## 2020-09-14 MED ORDER — LISINOPRIL 40 MG PO TABS: ORAL_TABLET | ORAL | 1 refills | Status: DC

## 2020-09-14 MED ORDER — AMLODIPINE BESYLATE 5 MG PO TABS: 5.0000 mg | ORAL_TABLET | Freq: Every day | ORAL | 1 refills | Status: DC

## 2020-09-14 MED ORDER — ROSUVASTATIN CALCIUM 10 MG PO TABS: 10.0000 mg | ORAL_TABLET | Freq: Every day | ORAL | 1 refills | Status: DC

## 2020-09-14 NOTE — Patient Instructions (Addendum)
Medication Instructions:  Continue your current medications.   We have sent a refill into your pharmacy of your cardiac medications including Carvedilol, Amlodipine, Lisinopril, Rosuvastatin.   *If you need a refill on your cardiac medications before your next appointment, please call your pharmacy*  Lab Work: None ordered today. Your cholesterol numbers in July looked great! We typically check once per year and will consider checking again at your next appointment.   Testing/Procedures: None ordered today.   Follow-Up: At [facility], you and your health needs are our priority.  As part of our continuing mission to provide you with exceptional heart care, we have created designated Provider Care Teams.  These Care Teams include your primary Cardiologist (physician) and Advanced Practice Providers (APPs -  Physician Assistants and Nurse Practitioners) who all work together to provide you with the care you need, when you need it.  We recommend signing up for the patient portal called "MyChart".  Sign up information is provided on this After Visit Summary.  MyChart is used to connect with patients for Virtual Visits (Telemedicine).  Patients are able to view lab/test results, encounter notes, upcoming appointments, etc.  Non-urgent messages can be sent to your provider as well.   To learn more about what you can do with MyChart, go to [email or web address].    Your next appointment:   6 month(s) follow up in person with Dr. Kirke Corin.  This has been scheduled for 03/29/2021 at 3:20PM. Dr. Kirke Corin is only in the [place] office on Tuesday afternoons. If this appointment slot does not work for you, simply call to let us know.  Other Instructions  Heart Healthy Diet Recommendations: A low-salt diet is recommended. Meats should be grilled, baked, or boiled. Avoid fried foods. Focus on lean protein sources like fish or chicken with vegetables and fruits. The American Heart Association is a Personal assistant!  American Heart Association Diet and Lifeystyle Recommendations can be found with a quick internet search.   Exercise recommendations: The American Heart Association recommends 150 minutes of moderate intensity exercise weekly. Try 30 minutes of moderate intensity exercise 4-5 times per week. This could include walking, jogging, or swimming.

## 2020-09-29 ENCOUNTER — Other Ambulatory Visit: Payer: Self-pay | Admitting: Family Medicine

## 2020-09-29 DIAGNOSIS — J4530 Mild persistent asthma, uncomplicated: Secondary | ICD-10-CM

## 2020-09-29 DIAGNOSIS — U071 COVID-19: Secondary | ICD-10-CM

## 2020-09-29 NOTE — Telephone Encounter (Signed)
}    Notes to clinic: Script last filled by Danielle Rankin  Review for refill    Requested Prescriptions  Pending Prescriptions Disp Refills   albuterol (PROAIR HFA) 108 (90 Base) MCG/ACT inhaler 8.5 g 2    Sig: INHALE 1-2 PUFFS INTO LUNGS EVERY 6 HOURS AS NEEDED FOR WHEEZING OR SHORTNESS OF BREATH      Pulmonology:  Beta Agonists Failed - 09/29/2020  2:06 PM      Failed - One inhaler should last at least one month. If the patient is requesting refills earlier, contact the patient to check for uncontrolled symptoms.      Passed - Valid encounter within last 12 months    Recent Outpatient Visits           1 month ago COVID-19   [facility], Jodelle Gross, FNP   6 months ago Mild persistent asthma, unspecified whether complicated   [facility], Jodelle Gross, FNP   8 months ago Encounter to establish care   [facility], Jodelle Gross, FNP       Future Appointments             In 6 months Kirke Corin, Chelsea Aus, MD [facility], [facility]

## 2020-09-29 NOTE — Telephone Encounter (Signed)
Copied from CRM [id number]. Topic: Quick Communication - Rx Refill/Question >> Sep 29, 2020  1:59 PM Gaetana Michaelis A wrote: Medication: albuterol (PROAIR HFA) 108 (90 Base) MCG/ACT inhaler  Has the patient contacted their pharmacy? Yes.Patient has made contact with pharmacy and been directed to contact PCP  Preferred Pharmacy (with phone number or street name): [facility] #[id number] Cheree Ditto, [place] - [address] AT [facility] OF [address] [facility] Phone: [phone]  Agent: Please be advised that RX refills may take up to 3 business days. We ask that you follow-up with your pharmacy.

## 2020-09-30 MED ORDER — ALBUTEROL SULFATE HFA 108 (90 BASE) MCG/ACT IN AERS: INHALATION_SPRAY | RESPIRATORY_TRACT | 2 refills | Status: DC

## 2020-09-30 NOTE — Telephone Encounter (Signed)
Pt called stating that he only has two puffs left. Please advise.

## 2020-10-25 ENCOUNTER — Other Ambulatory Visit: Payer: Self-pay | Admitting: Family Medicine

## 2020-10-25 DIAGNOSIS — J4530 Mild persistent asthma, uncomplicated: Secondary | ICD-10-CM

## 2020-11-08 ENCOUNTER — Ambulatory Visit (INDEPENDENT_AMBULATORY_CARE_PROVIDER_SITE_OTHER): Payer: Medicaid Other | Admitting: Family Medicine

## 2020-11-08 ENCOUNTER — Other Ambulatory Visit: Payer: Self-pay

## 2020-11-08 ENCOUNTER — Encounter: Payer: Self-pay | Admitting: Family Medicine

## 2020-11-08 VITALS — Ht 74.0 in | Wt 235.0 lb

## 2020-11-08 DIAGNOSIS — J209 Acute bronchitis, unspecified: Secondary | ICD-10-CM

## 2020-11-08 MED ORDER — BENZONATATE 100 MG PO CAPS: 100.0000 mg | ORAL_CAPSULE | Freq: Three times a day (TID) | ORAL | 0 refills | Status: DC | PRN

## 2020-11-08 MED ORDER — PREDNISONE 50 MG PO TABS: 50.0000 mg | ORAL_TABLET | Freq: Every day | ORAL | 0 refills | Status: DC

## 2020-11-08 NOTE — Progress Notes (Signed)
Virtual Visit via Telephone The purpose of this virtual visit is to provide medical care while limiting exposure to the novel coronavirus (COVID19) for both patient and office staff.  Consent was obtained for phone visit:  Yes.   Answered questions that patient had about telehealth interaction:  Yes.   I discussed the limitations, risks, security and privacy concerns of performing an evaluation and management service by telephone. I also discussed with the patient that there may be a patient responsible charge related to this service. The patient expressed understanding and agreed to proceed.  Patient Location: Home Provider Location: [facility] (Office)  Participants in virtual visit: - Patient: Bryan Ibarra - CMA: Burnell Blanks, CMA - Provider: Dr Althea Charon  Prior PCP Danielle Rankin, FNP   ---------------------------------------------------------------------- Chief Complaint  Patient presents with  . Cough  . Headache    S: Reviewed CMA documentation. I have called patient and gathered additional HPI as follows:  Acute Bronchitis Reports that symptoms started yesterday 11/07/20, with sinus congestion, drainage, coughing, had R sided eustachian tube dysfunction swelling. Home COVID Test Negative today. Recently treated 08/05/20 for COVID with Prednisone, Mucinex, Cough Syrup, Tessalon Perls - he has some dyspnea at times. - Continues to use Advair regularly and Albuterol PRN  Denies any fevers, chills, sweats, body ache, abdominal pain, diarrhea  Past Medical History:  Diagnosis Date  . Alcohol abuse    a. up to a 12 pack of beer/day.  . Arthritis   . CAD in native artery    a. LHC 6/18: severe 1 vessel CAD with 90% thrombotic stenosis in the proximal LAD which was felt to be the culprit for the patient's non-STEMI. He underwent successful PCI/DES with a resolute onyx drug-eluting stent (3.0 x 18 mm). There was 0% residual stenosis. LVEF estimated at  55-65% by visual estimate. LVEDP was mildly elevated  . H/O echocardiogram    a. echo 6/18: EF 60-65%, normal wall motion, grade 1 diastolic dysfunction, RV cavity size was normal with normal wall thickness and normal RV systolic function. There were no significant valvular abnormalities  . Hearing loss   . Heart attack (HCC)   . Hypertension   . Marijuana abuse    a. occasional.  . Tobacco abuse    Social History   Tobacco Use  . Smoking status: Current Some Day Smoker    Packs/day: 0.25    Years: 42.00    Pack years: 10.50    Types: Cigarettes  . Smokeless tobacco: Never Used  . Tobacco comment: smokes 5 cigarettes daily.   Vaping Use  . Vaping Use: Former  Substance Use Topics  . Alcohol use: Yes    Comment: 12 beers per week  . Drug use: Not Currently    Types: Marijuana    Comment: occasional MJ.  Remote cocaine.    Current Outpatient Medications:  .  albuterol (PROAIR HFA) 108 (90 Base) MCG/ACT inhaler, INHALE 1-2 PUFFS INTO LUNGS EVERY 6 HOURS AS NEEDED FOR WHEEZING OR SHORTNESS OF BREATH, Disp: 8.5 g, Rfl: 2 .  amLODipine (NORVASC) 5 MG tablet, Take 1 tablet (5 mg total) by mouth daily., Disp: 90 tablet, Rfl: 1 .  aspirin EC 81 MG tablet, Take 1 tablet (81 mg total) by mouth daily. Hold for 3 days., Disp: 30 tablet, Rfl: 0 .  benzonatate (TESSALON) 100 MG capsule, Take 1 capsule (100 mg total) by mouth 3 (three) times daily as needed for cough., Disp: 30 capsule, Rfl: 0 .  carvedilol (COREG) 6.25 MG tablet, Take 1 tablet (6.25 mg total) by mouth 2 (two) times daily with a meal., Disp: 180 tablet, Rfl: 1 .  Fluticasone-Salmeterol (ADVAIR DISKUS) 100-50 MCG/DOSE AEPB, INHALE 1 PUFF INTO THE LUNGS TWICE DAILY, Disp: 60 each, Rfl: 1 .  lisinopril (ZESTRIL) 40 MG tablet, TAKE 1 TABLET(40 MG) BY MOUTH DAILY, Disp: 90 tablet, Rfl: 1 .  Melatonin 10 MG TABS, Take 2 tablets by mouth daily., Disp: , Rfl:  .  predniSONE (DELTASONE) 50 MG tablet, Take 1 tablet (50 mg total) by  mouth daily with breakfast., Disp: 5 tablet, Rfl: 0 .  rosuvastatin (CRESTOR) 10 MG tablet, Take 1 tablet (10 mg total) by mouth daily., Disp: 90 tablet, Rfl: 1  Depression screen [place] 2/9 01/30/2020  Decreased Interest 0  Down, Depressed, Hopeless 0  PHQ - 2 Score 0  Altered sleeping 2  Tired, decreased energy 0  Change in appetite 0  Feeling bad or failure about yourself  0  Trouble concentrating 1  Moving slowly or fidgety/restless 0  Suicidal thoughts 0  PHQ-9 Score 3  Difficult doing work/chores Not difficult at all    No flowsheet data found.  -------------------------------------------------------------------------- O: No physical exam performed due to remote telephone encounter.  Lab results reviewed.  No results found for this or any previous visit (from the past 2160 hour(s)).  -------------------------------------------------------------------------- A&P:  Problem List Items Addressed This Visit   None   Visit Diagnoses    Acute bronchitis, unspecified organism    -  Primary   Relevant Medications   predniSONE (DELTASONE) 50 MG tablet   benzonatate (TESSALON) 100 MG capsule     Acute bronchitis / bronchospasm History of asthma on chart but patient not endorsing asthma Has had COVID 07/2020 responded w/ prednisone and other therapy Now acute onset symptoms 1 day, had home negative covid test today  Start trial again with Prednisone 50mg  x 5 day burst, Mucinex, Tessalon Rx Hold rx cough syrup this time Use Albuterol has refills, continue Advair Follow-up within 1 week if not improved, consider Zpak / CXR if indicated or other evaluation / treatment if needed.  Meds ordered this encounter  Medications  . predniSONE (DELTASONE) 50 MG tablet    Sig: Take 1 tablet (50 mg total) by mouth daily with breakfast.    Dispense:  5 tablet    Refill:  0  . benzonatate (TESSALON) 100 MG capsule    Sig: Take 1 capsule (100 mg total) by mouth 3 (three) times daily as  needed for cough.    Dispense:  30 capsule    Refill:  0    Follow-up: - Return in 1 week if unresolved  Patient verbalizes understanding with the above medical recommendations including the limitation of remote medical advice.  Specific follow-up and call-back criteria were given for patient to follow-up or seek medical care more urgently if needed.   - Time spent in direct consultation with patient on phone: 7 minutes   , DO [facility] 11/08/2020, 2:56 PM

## 2020-11-09 ENCOUNTER — Telehealth: Payer: Self-pay | Admitting: Family Medicine

## 2020-11-17 ENCOUNTER — Telehealth: Payer: Self-pay

## 2020-11-17 DIAGNOSIS — J209 Acute bronchitis, unspecified: Secondary | ICD-10-CM

## 2020-11-17 MED ORDER — AZITHROMYCIN 250 MG PO TABS: ORAL_TABLET | ORAL | 0 refills | Status: DC

## 2020-11-17 NOTE — Telephone Encounter (Signed)
Copied from CRM [id number]. Topic: General - Other >> Nov 17, 2020  1:42 PM Laural Benes, [place] C wrote: Reason for CRM: pt called in to be advised. Pt says that he was prescribed predniSONE (DELTASONE) 50 MG tablet and also benzonatate (TESSALON) 100 MG capsule. Pt says that neither are helping. Pt says that he was told that if symptoms werent better provider would also prescribe a zpak.    Pharmacy: [facility] #[id number] Cheree Ditto, [place] - [address] AT [facility] OF [address] [facility]  Phone:  [phone] Fax:  [phone]   Please advise.

## 2020-11-17 NOTE — Telephone Encounter (Signed)
Reviewed record from our last visit.  Please notify patient that - I agree and will send Zpak to [facility].  Saralyn Pilar, DO [facility] 11/17/2020, 4:04 PM

## 2020-12-07 ENCOUNTER — Other Ambulatory Visit: Payer: Self-pay | Admitting: Family Medicine

## 2020-12-07 DIAGNOSIS — U071 COVID-19: Secondary | ICD-10-CM

## 2020-12-07 DIAGNOSIS — J4530 Mild persistent asthma, uncomplicated: Secondary | ICD-10-CM

## 2020-12-07 MED ORDER — ALBUTEROL SULFATE HFA 108 (90 BASE) MCG/ACT IN AERS: INHALATION_SPRAY | RESPIRATORY_TRACT | 2 refills | Status: DC

## 2020-12-07 NOTE — Telephone Encounter (Signed)
Requested medication (s) are due for refill today: no  Requested medication (s) are on the active medication list: yes  Last refill: 09/2020  Future visit scheduled: yes  Notes to clinic:   One inhaler should last at least one month. If the patient is requesting refills earlier, contact the patient to check for uncontrolled symptoms  Requested Prescriptions  Pending Prescriptions Disp Refills   albuterol (PROAIR HFA) 108 (90 Base) MCG/ACT inhaler 8.5 g 2    Sig: INHALE 1-2 PUFFS INTO LUNGS EVERY 6 HOURS AS NEEDED FOR WHEEZING OR SHORTNESS OF BREATH      Pulmonology:  Beta Agonists Failed - 12/07/2020  8:42 AM      Failed - One inhaler should last at least one month. If the patient is requesting refills earlier, contact the patient to check for uncontrolled symptoms.      Passed - Valid encounter within last 12 months    Recent Outpatient Visits           4 weeks ago Acute bronchitis, unspecified organism   [facility], Netta Neat, DO   4 months ago COVID-19   [facility], Jodelle Gross, FNP   8 months ago Mild persistent asthma, unspecified whether complicated   [facility], Jodelle Gross, FNP   10 months ago Encounter to establish care   [facility], Jodelle Gross, FNP       Future Appointments             In 3 months Kirke Corin, Chelsea Aus, MD [facility], [facility]

## 2020-12-07 NOTE — Telephone Encounter (Signed)
Copied from CRM [id number]. Topic: Quick Communication - Rx Refill/Question >> Dec 07, 2020  8:38 AM Gaetana Michaelis A wrote: Medication: albuterol (PROAIR HFA) 108 (90 Base) MCG/ACT inhaler   Has the patient contacted their pharmacy? Yes. Patient has contacted their pharmacy but was uncertain if their pharmacy had contacted their PCP  Preferred Pharmacy (with phone number or street name): [facility] #[id number] Cheree Ditto, [place] - [address] AT [facility] OF [address] [facility]  Phone:  [phone] Fax:  [phone]  Agent: Please be advised that RX refills may take up to 3 business days. We ask that you follow-up with your pharmacy.

## 2020-12-16 ENCOUNTER — Other Ambulatory Visit: Payer: Self-pay

## 2020-12-16 DIAGNOSIS — J209 Acute bronchitis, unspecified: Secondary | ICD-10-CM

## 2020-12-16 MED ORDER — PREDNISONE 50 MG PO TABS: 50.0000 mg | ORAL_TABLET | Freq: Every day | ORAL | 0 refills | Status: DC

## 2020-12-16 MED ORDER — AZITHROMYCIN 250 MG PO TABS: ORAL_TABLET | ORAL | 0 refills | Status: DC

## 2020-12-23 ENCOUNTER — Other Ambulatory Visit: Payer: Self-pay | Admitting: Family Medicine

## 2020-12-23 DIAGNOSIS — J4530 Mild persistent asthma, uncomplicated: Secondary | ICD-10-CM

## 2020-12-30 ENCOUNTER — Other Ambulatory Visit: Payer: Self-pay

## 2020-12-30 DIAGNOSIS — J209 Acute bronchitis, unspecified: Secondary | ICD-10-CM

## 2020-12-30 MED ORDER — PREDNISONE 50 MG PO TABS: 50.0000 mg | ORAL_TABLET | Freq: Every day | ORAL | 0 refills | Status: DC

## 2021-01-26 ENCOUNTER — Other Ambulatory Visit: Payer: Self-pay | Admitting: Family Medicine

## 2021-01-26 DIAGNOSIS — J4530 Mild persistent asthma, uncomplicated: Secondary | ICD-10-CM

## 2021-02-08 ENCOUNTER — Other Ambulatory Visit: Payer: Self-pay | Admitting: Internal Medicine

## 2021-02-08 DIAGNOSIS — U071 COVID-19: Secondary | ICD-10-CM

## 2021-02-08 DIAGNOSIS — J4530 Mild persistent asthma, uncomplicated: Secondary | ICD-10-CM

## 2021-02-08 NOTE — Telephone Encounter (Signed)
Requested medication (s) are due for refill today: no  Requested medication (s) are on the active medication list: yes  Last refill:  01/18/2021  Future visit scheduled: yes  Notes to clinic:  One inhaler should last at least one month. If the patient is requesting refills earlier, contact the patient to check for uncontrolled symptoms   Requested Prescriptions  Pending Prescriptions Disp Refills   albuterol (VENTOLIN HFA) 108 (90 Base) MCG/ACT inhaler [Pharmacy Med Name: ALBUTEROL HFA INH (200 PUFFS)8.5GM] 8.5 g 2    Sig: INHALE 1 TO 2 PUFFS INTO THE LUNGS EVERY 6 HOURS AS NEEDED FOR WHEEZING OR SHORTNESS OF BREATH      Pulmonology:  Beta Agonists Failed - 02/08/2021  1:33 PM      Failed - One inhaler should last at least one month. If the patient is requesting refills earlier, contact the patient to check for uncontrolled symptoms.      Passed - Valid encounter within last 12 months    Recent Outpatient Visits           3 months ago Acute bronchitis, unspecified organism   [facility], Netta Neat, DO   6 months ago COVID-19   [facility], Jodelle Gross, FNP   10 months ago Mild persistent asthma, unspecified whether complicated   [facility], Jodelle Gross, FNP   1 year ago Encounter to establish care   [facility], Jodelle Gross, FNP       Future Appointments             In 3 weeks Althea Charon, Netta Neat, DO [facility], [facility]   In 1 month Kirke Corin, Chelsea Aus, MD [facility], [facility]

## 2021-03-02 ENCOUNTER — Other Ambulatory Visit: Payer: Self-pay

## 2021-03-02 ENCOUNTER — Encounter: Payer: Self-pay | Admitting: Family Medicine

## 2021-03-02 ENCOUNTER — Ambulatory Visit (INDEPENDENT_AMBULATORY_CARE_PROVIDER_SITE_OTHER): Payer: Medicaid Other | Admitting: Family Medicine

## 2021-03-02 VITALS — BP 140/89 | HR 74 | Ht 74.0 in | Wt 264.4 lb

## 2021-03-02 DIAGNOSIS — J4530 Mild persistent asthma, uncomplicated: Secondary | ICD-10-CM | POA: Diagnosis not present

## 2021-03-02 DIAGNOSIS — N529 Male erectile dysfunction, unspecified: Secondary | ICD-10-CM

## 2021-03-02 DIAGNOSIS — J3089 Other allergic rhinitis: Secondary | ICD-10-CM | POA: Diagnosis not present

## 2021-03-02 MED ORDER — ALBUTEROL SULFATE HFA 108 (90 BASE) MCG/ACT IN AERS: INHALATION_SPRAY | RESPIRATORY_TRACT | 5 refills | Status: DC

## 2021-03-02 MED ORDER — SILDENAFIL CITRATE 20 MG PO TABS: ORAL_TABLET | ORAL | 3 refills | Status: DC

## 2021-03-02 MED ORDER — FLUTICASONE-SALMETEROL 100-50 MCG/ACT IN AEPB: 1.0000 | INHALATION_SPRAY | Freq: Two times a day (BID) | RESPIRATORY_TRACT | 5 refills | Status: DC

## 2021-03-02 MED ORDER — MONTELUKAST SODIUM 10 MG PO TABS: 10.0000 mg | ORAL_TABLET | Freq: Every day | ORAL | 1 refills | Status: DC

## 2021-03-02 NOTE — Patient Instructions (Addendum)
Thank you for coming to the office today.  Start Singulair 10mg  nightly for asthma and allergies  Refilled Advair 1 puff twice a day for 6 months  Try to scale back on Albuterol as needed.  Ordered Sildenafil as needed for erection function use goodrx printed rx   Please schedule a Follow-up Appointment to: Return in about 6 months (around 09/02/2021) for 6 month follow-up to establish with 10/31/2021, f/u Asthma.  If you have any other questions or concerns, please feel free to call the office or send a message through MyChart. You may also schedule an earlier appointment if necessary.  Additionally, you may be receiving a survey about your experience at our office within a few days to 1 week by e-mail or mail. We value your feedback.  Rene Kocher, DO [facility], [facility]

## 2021-03-02 NOTE — Progress Notes (Signed)
Subjective:    Patient ID: Bryan Ibarra., male    DOB: [birth year], 65 y.o.   MRN: [id number]  Bryan Ibarra. is a 65 y.o. male presenting on 03/02/2021 for Shortness of Breath  PCP Danielle Rankin, FNP now has not set up with Nicki Reaper, FNP yet  HPI  Mild Persistent Asthma Environmental Allergies Chronic problem On Advair with relief He has had history of COVID earlier in 2022 and bronchitis flare. Now today doing well from breathing stand point but needs refills on Advair and Albuterol.  Erectile Dysfunction Reports chronic issue with difficulty obtaining and maintaining erection. Never tried meds before He has history of heart disease CAD but does not take NTG.   Depression screen [facility] 2/9 01/30/2020  Decreased Interest 0  Down, Depressed, Hopeless 0  PHQ - 2 Score 0  Altered sleeping 2  Tired, decreased energy 0  Change in appetite 0  Feeling bad or failure about yourself  0  Trouble concentrating 1  Moving slowly or fidgety/restless 0  Suicidal thoughts 0  PHQ-9 Score 3  Difficult doing work/chores Not difficult at all    Social History   Tobacco Use   Smoking status: Some Days    Packs/day: 0.25    Years: 42.00    Pack years: 10.50    Types: Cigarettes   Smokeless tobacco: Never   Tobacco comments:    smokes 5 cigarettes daily.   Vaping Use   Vaping Use: Former  Substance Use Topics   Alcohol use: Yes    Comment: 12 beers per week   Drug use: Not Currently    Types: Marijuana    Comment: occasional MJ.  Remote cocaine.    Review of Systems Per HPI unless specifically indicated above     Objective:    BP 140/89   Pulse 74   Ht 6\' 2"  (1.88 m)   Wt 264 lb 6.4 oz (119.9 kg)   SpO2 96%   BMI 33.95 kg/m   Wt Readings from Last 3 Encounters:  03/02/21 264 lb 6.4 oz (119.9 kg)  11/08/20 235 lb (106.6 kg)  09/14/20 235 lb (106.6 kg)    Physical Exam Vitals and nursing note reviewed.  Constitutional:      General: He is not in acute  distress.    Appearance: He is well-developed. He is not diaphoretic.     Comments: Well-appearing, comfortable, cooperative  HENT:     Head: Normocephalic and atraumatic.  Eyes:     General:        Right eye: No discharge.        Left eye: No discharge.     Conjunctiva/sclera: Conjunctivae normal.  Neck:     Thyroid: No thyromegaly.  Cardiovascular:     Rate and Rhythm: Normal rate and regular rhythm.     Pulses: Normal pulses.     Heart sounds: Normal heart sounds. No murmur heard. Pulmonary:     Effort: Pulmonary effort is normal. No respiratory distress.     Breath sounds: Examination of the right-upper field reveals wheezing. Examination of the left-upper field reveals wheezing. Wheezing present. No rales.  Musculoskeletal:        General: Normal range of motion.     Cervical back: Normal range of motion and neck supple.  Lymphadenopathy:     Cervical: No cervical adenopathy.  Skin:    General: Skin is warm and dry.     Findings: No erythema or rash.  Neurological:  Mental Status: He is alert and oriented to person, place, and time. Mental status is at baseline.  Psychiatric:        Behavior: Behavior normal.     Comments: Well groomed, good eye contact, normal speech and thoughts   Results for orders placed or performed during the hospital encounter of 07/01/20  Resp Panel by RT-PCR (Flu A&B, Covid) Nasopharyngeal Swab   Specimen: Nasopharyngeal Swab; Nasopharyngeal(NP) swabs in vial transport medium  Result Value Ref Range   SARS Coronavirus 2 by RT PCR NEGATIVE NEGATIVE   Influenza A by PCR NEGATIVE NEGATIVE   Influenza B by PCR NEGATIVE NEGATIVE  Basic metabolic panel  Result Value Ref Range   Sodium 136 135 - 145 mmol/L   Potassium 3.7 3.5 - 5.1 mmol/L   Chloride 99 98 - 111 mmol/L   CO2 23 22 - 32 mmol/L   Glucose, Bld 110 (H) 70 - 99 mg/dL   BUN 11 8 - 23 mg/dL   Creatinine, Ser 1.61 0.61 - 1.24 mg/dL   Calcium 9.6 8.9 - 09.6 mg/dL   GFR, Estimated >04  >54 mL/min   Anion gap 14 5 - 15  CBC  Result Value Ref Range   WBC 7.2 4.0 - 10.5 K/uL   RBC 4.93 4.22 - 5.81 MIL/uL   Hemoglobin 15.8 13.0 - 17.0 g/dL   HCT 09.8 11.9 - 14.7 %   MCV 91.9 80.0 - 100.0 fL   MCH 32.0 26.0 - 34.0 pg   MCHC 34.9 30.0 - 36.0 g/dL   RDW 82.9 56.2 - 13.0 %   Platelets 213 150 - 400 K/uL   nRBC 0.0 0.0 - 0.2 %      Assessment & Plan:   Problem List Items Addressed This Visit     Environmental and seasonal allergies   Asthma - Primary   Relevant Medications   fluticasone-salmeterol (ADVAIR DISKUS) 100-50 MCG/ACT AEPB   albuterol (VENTOLIN HFA) 108 (90 Base) MCG/ACT inhaler   montelukast (SINGULAIR) 10 MG tablet   Other Visit Diagnoses     Erectile dysfunction, unspecified erectile dysfunction type       Relevant Medications   sildenafil (REVATIO) 20 MG tablet       Asthma, mild persistent Allergies / Environmental seasonal  Chronic problem, improved on maintenance therapy Continue Advair generic, refilled today Refill Albuterol PRN  Add new therapy Singulair 10mg  nightly for asthma/allergy  Counseling that outdoor lawncare activities now can flare up his symptoms unfortunately  #ED Persistent issue, no prior meds tried Likely combination of medical conditions and medications Trial on PDE5 Sildenafil 20-100mg  PRN dosing instructions reviewed, goodrx [employer] printed   Meds ordered this encounter  Medications   fluticasone-salmeterol (ADVAIR DISKUS) 100-50 MCG/ACT AEPB    Sig: Inhale 1 puff into the lungs 2 (two) times daily.    Dispense:  60 each    Refill:  5   albuterol (VENTOLIN HFA) 108 (90 Base) MCG/ACT inhaler    Sig: INHALE 1 TO 2 PUFFS INTO THE LUNGS EVERY 6 HOURS AS NEEDED FOR WHEEZING OR SHORTNESS OF BREATH    Dispense:  8.5 g    Refill:  5   montelukast (SINGULAIR) 10 MG tablet    Sig: Take 1 tablet (10 mg total) by mouth at bedtime.    Dispense:  90 tablet    Refill:  1   sildenafil (REVATIO) 20 MG tablet    Sig:  Take 1-5 pills about 30 min prior to sex. Start with 1  and increase as needed.    Dispense:  90 tablet    Refill:  3      Follow up plan: Return in about 6 months (around 09/02/2021) for 6 month follow-up to establish with Rene Kocher, f/u Asthma.  Saralyn Pilar, DO [facility] 03/02/2021, 2:34 PM

## 2021-03-20 ENCOUNTER — Other Ambulatory Visit: Payer: Self-pay | Admitting: Family

## 2021-03-20 DIAGNOSIS — I10 Essential (primary) hypertension: Secondary | ICD-10-CM

## 2021-03-20 DIAGNOSIS — E785 Hyperlipidemia, unspecified: Secondary | ICD-10-CM

## 2021-03-20 DIAGNOSIS — I25118 Atherosclerotic heart disease of native coronary artery with other forms of angina pectoris: Secondary | ICD-10-CM

## 2021-03-29 ENCOUNTER — Encounter: Payer: Self-pay | Admitting: Cardiovascular Disease

## 2021-03-29 ENCOUNTER — Other Ambulatory Visit: Payer: Self-pay

## 2021-03-29 ENCOUNTER — Ambulatory Visit: Payer: Medicaid Other | Admitting: Cardiovascular Disease

## 2021-03-29 VITALS — BP 138/82 | HR 65 | Ht 74.0 in | Wt 261.0 lb

## 2021-03-29 DIAGNOSIS — F1010 Alcohol abuse, uncomplicated: Secondary | ICD-10-CM

## 2021-03-29 DIAGNOSIS — I10 Essential (primary) hypertension: Secondary | ICD-10-CM

## 2021-03-29 DIAGNOSIS — E785 Hyperlipidemia, unspecified: Secondary | ICD-10-CM

## 2021-03-29 DIAGNOSIS — I2510 Atherosclerotic heart disease of native coronary artery without angina pectoris: Secondary | ICD-10-CM

## 2021-03-29 DIAGNOSIS — Z720 Tobacco use: Secondary | ICD-10-CM | POA: Diagnosis not present

## 2021-03-29 DIAGNOSIS — R0602 Shortness of breath: Secondary | ICD-10-CM

## 2021-03-29 MED ORDER — LISINOPRIL 40 MG PO TABS: ORAL_TABLET | ORAL | 1 refills | Status: DC

## 2021-03-29 MED ORDER — AMLODIPINE BESYLATE 5 MG PO TABS: 5.0000 mg | ORAL_TABLET | Freq: Every day | ORAL | 1 refills | Status: DC

## 2021-03-29 NOTE — Patient Instructions (Signed)
Medication Instructions:  Your physician recommends that you continue on your current medications as directed. Please refer to the Current Medication list given to you today.  *If you need a refill on your cardiac medications before your next appointment, please call your pharmacy*   Lab Work: None ordered  If you have labs (blood work) drawn today and your tests are completely normal, you will receive your results only by: MyChart Message (if you have MyChart) OR A paper copy in the mail If you have any lab test that is abnormal or we need to change your treatment, we will call you to review the results.   Testing/Procedures: Your physician has requested that you have an echocardiogram. Echocardiography is a painless test that uses sound waves to create images of your heart. It provides your doctor with information about the size and shape of your heart and how well your heart's chambers and valves are working. This procedure takes approximately one hour. There are no restrictions for this procedure.    Follow-Up: At [facility], you and your health needs are our priority.  As part of our continuing mission to provide you with exceptional heart care, we have created designated Provider Care Teams.  These Care Teams include your primary Cardiologist (physician) and Advanced Practice Providers (APPs -  Physician Assistants and Nurse Practitioners) who all work together to provide you with the care you need, when you need it.  We recommend signing up for the patient portal called "MyChart".  Sign up information is provided on this After Visit Summary.  MyChart is used to connect with patients for Virtual Visits (Telemedicine).  Patients are able to view lab/test results, encounter notes, upcoming appointments, etc.  Non-urgent messages can be sent to your provider as well.   To learn more about what you can do with MyChart, go to [email or web address].    Your next appointment:   6  month(s)  The format for your next appointment:   In Person  Provider:   You may see Muhammad Arida, MD or one of the following Advanced Practice Providers on your designated Care Team:   Christopher Berge, NP Ryan Dunn, PA-C Jacquelyn Visser, PA-C Cadence Furth, PA-C   Other Instructions N/A  

## 2021-03-29 NOTE — Progress Notes (Signed)
Cardiology Office Note   Date:  03/29/2021   ID:  Bryan Craver., DOB Feb 22, 1956, MRN [id number]  PCP:  Lorre Munroe, NP  Cardiologist:   Lorine Bears, MD   Chief Complaint  Patient presents with   Other    6 month follow up -- patient c.o increased SOB. Meds reviewed verbally with patient.      History of Present Illness: Bryan Eisenhour. is a 65 y.o. male who presents for follow-up visit regarding coronary artery disease. He had non-ST elevation myocardial infarction in June 2018.  He underwent PCI and drug-eluting stent placement to the LAD without complications.  Ejection fraction was normal.  Other medical problems include hypertension, polysubstance abuse including tobacco, alcohol and marijuana and remote history of cocaine use. He did not tolerate high-dose atorvastatin due to myalgia.   He had COVID this year and gained significant weight while he was recovering.  He reports worsening exertional dyspnea with no chest pain or orthopnea.  He takes his medications regularly. He cut down on tobacco use to 3 to 4 cigarettes a day but has not been able to quit completely.    Past Medical History:  Diagnosis Date   Alcohol abuse    a. up to a 12 pack of beer/day.   Arthritis    CAD in native artery    a. LHC 6/18: severe 1 vessel CAD with 90% thrombotic stenosis in the proximal LAD which was felt to be the culprit for the patient's non-STEMI. He underwent successful PCI/DES with a resolute onyx drug-eluting stent (3.0 x 18 mm). There was 0% residual stenosis. LVEF estimated at 55-65% by visual estimate. LVEDP was mildly elevated   H/O echocardiogram    a. echo 6/18: EF 60-65%, normal wall motion, grade 1 diastolic dysfunction, RV cavity size was normal with normal wall thickness and normal RV systolic function. There were no significant valvular abnormalities   Hearing loss    Heart attack (HCC)    Hypertension    Marijuana abuse    a. occasional.   Tobacco  abuse     Past Surgical History:  Procedure Laterality Date   APPENDECTOMY     CORONARY STENT INTERVENTION N/A 01/01/2017   Procedure: Coronary Stent Intervention;  Surgeon: Iran Ouch, MD;  Location: [facility] INVASIVE CV LAB;  Service: Cardiovascular;  Laterality: N/A;   KNEE SURGERY     LEFT HEART CATH AND CORONARY ANGIOGRAPHY N/A 01/01/2017   Procedure: Left Heart Cath and Coronary Angiography;  Surgeon: Iran Ouch, MD;  Location: [facility] INVASIVE CV LAB;  Service: Cardiovascular;  Laterality: N/A;   NASAL ENDOSCOPY N/A 08/02/2019   Procedure: NASAL ENDOSCOPY;  Surgeon: Tamela Gammon, MD;  Location: [facility] ORS;  Service: ENT;  Laterality: N/A;   NASAL HEMORRHAGE CONTROL N/A 08/02/2019   Procedure: EPISTAXIS CONTROL;  Surgeon: Tamela Gammon, MD;  Location: [facility] ORS;  Service: ENT;  Laterality: N/A;   shunt heart attack       Current Outpatient Medications  Medication Sig Dispense Refill   albuterol (VENTOLIN HFA) 108 (90 Base) MCG/ACT inhaler INHALE 1 TO 2 PUFFS INTO THE LUNGS EVERY 6 HOURS AS NEEDED FOR WHEEZING OR SHORTNESS OF BREATH 8.5 g 5   amLODipine (NORVASC) 5 MG tablet Take 1 tablet (5 mg total) by mouth daily. 90 tablet 1   aspirin EC 81 MG tablet Take 1 tablet (81 mg total) by mouth daily. Hold for 3 days. 30 tablet 0  carvedilol (COREG) 6.25 MG tablet TAKE 1 TABLET(6.25 MG) BY MOUTH TWICE DAILY WITH A MEAL 180 tablet 1   fluticasone-salmeterol (ADVAIR DISKUS) 100-50 MCG/ACT AEPB Inhale 1 puff into the lungs 2 (two) times daily. 60 each 5   lisinopril (ZESTRIL) 40 MG tablet TAKE 1 TABLET(40 MG) BY MOUTH DAILY 90 tablet 1   Melatonin 10 MG TABS Take 2 tablets by mouth daily.     montelukast (SINGULAIR) 10 MG tablet Take 1 tablet (10 mg total) by mouth at bedtime. 90 tablet 1   rosuvastatin (CRESTOR) 10 MG tablet TAKE 1 TABLET(10 MG) BY MOUTH DAILY 90 tablet 1   sildenafil (REVATIO) 20 MG tablet Take 1-5 pills about 30 min prior to sex. Start with 1 and increase as needed.  90 tablet 3   No current facility-administered medications for this visit.    Allergies:   Patient has no known allergies.    Social History:  The patient  reports that he has been smoking cigarettes. He has a 10.50 pack-year smoking history. He has never used smokeless tobacco. He reports current alcohol use. He reports that he does not currently use drugs after having used the following drugs: Marijuana.   Family History:  The patient's family history includes CAD in his father and paternal grandfather; Colon polyps in his brother; Diabetes in his father.    ROS:  Please see the history of present illness.   Otherwise, review of systems are positive for none.   All other systems are reviewed and negative.    PHYSICAL EXAM: VS:  BP 138/82 (BP Location: Left Arm, Patient Position: Sitting, Cuff Size: Normal)   Pulse 65   Ht 6\' 2"  (1.88 m)   Wt 261 lb (118.4 kg)   SpO2 96%   BMI 33.51 kg/m  , BMI Body mass index is 33.51 kg/m. GEN: Well nourished, well developed, in no acute distress  HEENT: normal  Neck: no JVD, carotid bruits, or masses Cardiac: RRR; no murmurs, rubs, or gallops, mild bilateral leg edema Respiratory:  clear to auscultation bilaterally, normal work of breathing GI: soft, nontender, nondistended, + BS MS: no deformity or atrophy  Skin: warm and dry, no rash Neuro:  Strength and sensation are intact Psych: euthymic mood, full affect   EKG:  EKG is ordered today. EKG showed normal sinus rhythm with left axis deviation and low voltage.   Recent Labs: 06/11/2020: B Natriuretic Peptide 78.7 07/01/2020: BUN 11; Creatinine, Ser 0.93; Hemoglobin 15.8; Platelets 213; Potassium 3.7; Sodium 136    Lipid Panel    Component Value Date/Time   CHOL 153 01/30/2020 1115   TRIG 142 01/30/2020 1115   HDL 76 01/30/2020 1115   CHOLHDL 2.0 01/30/2020 1115   VLDL 12 06/24/2018 1725   LDLCALC 54 01/30/2020 1115   LDLDIRECT <4 06/24/2018 1725      Wt Readings from  Last 3 Encounters:  03/29/21 261 lb (118.4 kg)  03/02/21 264 lb 6.4 oz (119.9 kg)  11/08/20 235 lb (106.6 kg)     No flowsheet data found.    ASSESSMENT AND PLAN:  1.  Coronary artery disease involving native coronary arteries without angina: He is doing reasonably well overall with no anginal symptoms.  Continue aspirin and rosuvastatin.  2.  Essential hypertension: Blood pressure is controlled.  I refilled amlodipine and lisinopril.   3.  Hyperlipidemia: Continue small dose rosuvastatin.  Most recent lipid profile showed an LDL of 54.   4.  Tobacco use: He cut down  but has not been able to quit completely.  I discussed with him the importance of smoking cessation.   5.  Alcohol use: He reports that he cut down significantly and denies excessive use at the present time.  6.  Worsening exertional dyspnea: Suspect is likely due to continued tobacco use and physical deconditioning with weight gain.  Nonetheless, I think we should evaluate for diastolic heart failure.  I requested an echocardiogram.    Disposition:   FU with me in 6 months  Signed,  Lorine Bears, MD  03/29/2021 3:17 PM     [facility]

## 2021-04-26 ENCOUNTER — Ambulatory Visit (INDEPENDENT_AMBULATORY_CARE_PROVIDER_SITE_OTHER): Payer: Medicaid Other

## 2021-04-26 ENCOUNTER — Other Ambulatory Visit: Payer: Self-pay

## 2021-04-26 DIAGNOSIS — R0602 Shortness of breath: Secondary | ICD-10-CM

## 2021-04-27 LAB — ECHOCARDIOGRAM COMPLETE
AR max vel: 2.89 cm2
AV Area VTI: 2.53 cm2
AV Area mean vel: 2.51 cm2
AV Mean grad: 5 mmHg
AV Peak grad: 8.6 mmHg
Ao pk vel: 1.47 m/s
Area-P 1/2: 2.96 cm2
Calc EF: 70.2 %
S' Lateral: 4.19 cm
Single Plane A2C EF: 67.4 %
Single Plane A4C EF: 72.1 %

## 2021-05-27 ENCOUNTER — Other Ambulatory Visit: Payer: Self-pay | Admitting: Family Medicine

## 2021-05-27 DIAGNOSIS — J4530 Mild persistent asthma, uncomplicated: Secondary | ICD-10-CM

## 2021-06-01 ENCOUNTER — Encounter: Payer: Self-pay | Admitting: Internal Medicine

## 2021-06-01 ENCOUNTER — Telehealth (INDEPENDENT_AMBULATORY_CARE_PROVIDER_SITE_OTHER): Payer: Medicaid Other | Admitting: Internal Medicine

## 2021-06-01 ENCOUNTER — Other Ambulatory Visit: Payer: Self-pay

## 2021-06-01 DIAGNOSIS — R6889 Other general symptoms and signs: Secondary | ICD-10-CM

## 2021-06-01 DIAGNOSIS — J4540 Moderate persistent asthma, uncomplicated: Secondary | ICD-10-CM

## 2021-06-01 MED ORDER — PREDNISONE 10 MG PO TABS: ORAL_TABLET | ORAL | 0 refills | Status: DC

## 2021-06-01 MED ORDER — HYDROCODONE BIT-HOMATROP MBR 5-1.5 MG/5ML PO SOLN: 5.0000 mL | Freq: Three times a day (TID) | ORAL | 0 refills | Status: DC | PRN

## 2021-06-01 NOTE — Progress Notes (Signed)
Virtual Visit via Video Note  I connected with Bryan Ibarra. on 06/01/21 at 11:20 AM EDT by a video enabled telemedicine application and verified that I am speaking with the correct person using two identifiers.  Location: Patient: Home Provider: Office  Persons participating in this video call: Nicki Reaper, NP and Vito Berger   I discussed the limitations of evaluation and management by telemedicine and the availability of in person appointments. The patient expressed understanding and agreed to proceed.  History of Present Illness:  Patient reports nasal congestion, cough, shortness of breath, diarrhea, chills and weakness.  He reports this started 4 days ago.  He is blowing clear mucus out of his nose.  The cough is productive of clear mucus.  He denies headache, runny nose, ear pain, sore throat, chest pain, nausea, vomiting or body aches.  He has taken Singulair, Advair and albuterol as prescribed.  He has a history of asthma.  He has not had sick contacts or exposure to the flu or COVID that he is aware of.  He has not had his flu or COVID vaccines.  He smokes occasionally.   Past Medical History:  Diagnosis Date   Alcohol abuse    a. up to a 12 pack of beer/day.   Arthritis    CAD in native artery    a. LHC 6/18: severe 1 vessel CAD with 90% thrombotic stenosis in the proximal LAD which was felt to be the culprit for the patient's non-STEMI. He underwent successful PCI/DES with a resolute onyx drug-eluting stent (3.0 x 18 mm). There was 0% residual stenosis. LVEF estimated at 55-65% by visual estimate. LVEDP was mildly elevated   H/O echocardiogram    a. echo 6/18: EF 60-65%, normal wall motion, grade 1 diastolic dysfunction, RV cavity size was normal with normal wall thickness and normal RV systolic function. There were no significant valvular abnormalities   Hearing loss    Heart attack (HCC)    Hypertension    Marijuana abuse    a. occasional.   Tobacco abuse      Current Outpatient Medications  Medication Sig Dispense Refill   albuterol (VENTOLIN HFA) 108 (90 Base) MCG/ACT inhaler INHALE 1 TO 2 PUFFS INTO THE LUNGS EVERY 6 HOURS AS NEEDED FOR WHEEZING OR SHORTNESS OF BREATH 8.5 g 5   amLODipine (NORVASC) 5 MG tablet Take 1 tablet (5 mg total) by mouth daily. 90 tablet 1   aspirin EC 81 MG tablet Take 1 tablet (81 mg total) by mouth daily. Hold for 3 days. 30 tablet 0   carvedilol (COREG) 6.25 MG tablet TAKE 1 TABLET(6.25 MG) BY MOUTH TWICE DAILY WITH A MEAL 180 tablet 1   fluticasone-salmeterol (ADVAIR DISKUS) 100-50 MCG/ACT AEPB Inhale 1 puff into the lungs 2 (two) times daily. 60 each 5   HYDROcodone bit-homatropine (HYCODAN) 5-1.5 MG/5ML syrup Take 5 mLs by mouth every 8 (eight) hours as needed for cough. 120 mL 0   lisinopril (ZESTRIL) 40 MG tablet TAKE 1 TABLET(40 MG) BY MOUTH DAILY 90 tablet 1   Melatonin 10 MG TABS Take 2 tablets by mouth daily.     montelukast (SINGULAIR) 10 MG tablet TAKE 1 TABLET(10 MG) BY MOUTH AT BEDTIME 90 tablet 1   predniSONE (DELTASONE) 10 MG tablet Take 6 tabs on day 1, 5 tabs on day 2, 4 tabs on day 3, 3 tabs on day 4, 2 tabs on day 5, 1 tab on day 6 21 tablet 0   rosuvastatin (  CRESTOR) 10 MG tablet TAKE 1 TABLET(10 MG) BY MOUTH DAILY 90 tablet 1   sildenafil (REVATIO) 20 MG tablet Take 1-5 pills about 30 min prior to sex. Start with 1 and increase as needed. 90 tablet 3   No current facility-administered medications for this visit.    No Known Allergies  Family History  Problem Relation Age of Onset   CAD Father        s/p CABG in his 35's or 76's.   Diabetes Father    CAD Paternal Grandfather    Colon polyps Brother     Social History   Socioeconomic History   Marital status: Divorced    Spouse name: Not on file   Number of children: Not on file   Years of education: Not on file   Highest education level: High school graduate  Occupational History   Not on file  Tobacco Use   Smoking status:  Some Days    Packs/day: 0.25    Years: 42.00    Pack years: 10.50    Types: Cigarettes   Smokeless tobacco: Never   Tobacco comments:    smokes 5 cigarettes daily.   Vaping Use   Vaping Use: Former  Substance and Sexual Activity   Alcohol use: Yes    Comment: 12 beers per week   Drug use: Not Currently    Types: Marijuana    Comment: occasional MJ.  Remote cocaine.   Sexual activity: Yes  Other Topics Concern   Not on file  Social History Narrative   Lives in [place].  He lives in a mobile home in the backyard of his girlfriend.  He works in Aeronautical engineer.   Social Determinants of Health   Financial Resource Strain: Not on file  Food Insecurity: Not on file  Transportation Needs: Not on file  Physical Activity: Not on file  Stress: Not on file  Social Connections: Not on file  Intimate Partner Violence: Not on file     Constitutional: Patient reports chills.  Denies fatigue, headache or abrupt weight changes.  HEENT: Patient reports nasal congestion.  Denies eye pain, eye redness, ear pain, ringing in the ears, wax buildup, runny nose, bloody nose, or sore throat. Respiratory: Patient reports cough and shortness of breath.  Denies difficulty breathing.   Cardiovascular: Denies chest pain, chest tightness, palpitations or swelling in the hands or feet.  Gastrointestinal: Patient reports diarrhea.  Denies abdominal pain, bloating, constipation, diarrhea or blood in the stool.  Musculoskeletal: Patient reports weakness.  Denies decrease in range of motion, difficulty with gait, muscle pain or joint pain and swelling.   No other specific complaints in a complete review of systems (except as listed in HPI above).    Observations/Objective:   Wt Readings from Last 3 Encounters:  03/29/21 261 lb (118.4 kg)  03/02/21 264 lb 6.4 oz (119.9 kg)  11/08/20 235 lb (106.6 kg)    General: Appears his stated age, appears unwell but in NAD. HEENT: Head: normal shape and size; Nose:  Congestion noted; Throat/Mouth: Hoarseness noted.  Pulmonary/Chest: Normal effort. No respiratory distress.  Neurological: Alert and oriented.   BMET    Component Value Date/Time   NA 136 07/01/2020 1418   NA 141 01/15/2017 1407   K 3.7 07/01/2020 1418   CL 99 07/01/2020 1418   CO2 23 07/01/2020 1418   GLUCOSE 110 (H) 07/01/2020 1418   BUN 11 07/01/2020 1418   BUN 14 01/15/2017 1407   CREATININE 0.93 07/01/2020 1418  CREATININE 1.13 01/30/2020 1115   CALCIUM 9.6 07/01/2020 1418   GFRNONAA >60 07/01/2020 1418   GFRNONAA 69 01/30/2020 1115   GFRAA 80 01/30/2020 1115    Lipid Panel     Component Value Date/Time   CHOL 153 01/30/2020 1115   TRIG 142 01/30/2020 1115   HDL 76 01/30/2020 1115   CHOLHDL 2.0 01/30/2020 1115   VLDL 12 06/24/2018 1725   LDLCALC 54 01/30/2020 1115    CBC    Component Value Date/Time   WBC 7.2 07/01/2020 1418   RBC 4.93 07/01/2020 1418   HGB 15.8 07/01/2020 1418   HCT 45.3 07/01/2020 1418   PLT 213 07/01/2020 1418   MCV 91.9 07/01/2020 1418   MCH 32.0 07/01/2020 1418   MCHC 34.9 07/01/2020 1418   RDW 11.9 07/01/2020 1418   LYMPHSABS 1,398 01/30/2020 1115   MONOABS 0.5 08/02/2019 0703   EOSABS 68 01/30/2020 1115   BASOSABS 53 01/30/2020 1115    Hgb A1C Lab Results  Component Value Date   HGBA1C 5.3 01/01/2017        Assessment and Plan:  Flu Like Symptoms, Asthma:  No indication for flu testing at this time as he is outside the window for antiviral therapy Encouraged rest and fluids RX For Pred Taper x 6 days RX for Hycodan for cough- sedation caution given Continue Singulair, Advair and Albuterol as prescribed Ok to take Imodium OTC as needed  Return precautions discussed Follow Up Instructions:    I discussed the assessment and treatment plan with the patient. The patient was provided an opportunity to ask questions and all were answered. The patient agreed with the plan and demonstrated an understanding of the  instructions.   The patient was advised to call back or seek an in-person evaluation if the symptoms worsen or if the condition fails to improve as anticipated.   Nicki Reaper, NP

## 2021-06-01 NOTE — Patient Instructions (Signed)
Influenza, Adult °Influenza is also called "the flu." It is an infection in the lungs, nose, and throat (respiratory tract). It spreads easily from person to person (is contagious). The flu causes symptoms that are like a cold, along with high fever and body aches. °What are the causes? °This condition is caused by the influenza virus. You can get the virus by: °Breathing in droplets that are in the air after a person infected with the flu coughed or sneezed. °Touching something that has the virus on it and then touching your mouth, nose, or eyes. °What increases the risk? °Certain things may make you more likely to get the flu. These include: °Not washing your hands often. °Having close contact with many people during cold and flu season. °Touching your mouth, eyes, or nose without first washing your hands. °Not getting a flu shot every year. °You may have a higher risk for the flu, and serious problems, such as a lung infection (pneumonia), if you: °Are older than 65. °Are pregnant. °Have a weakened disease-fighting system (immune system) because of a disease or because you are taking certain medicines. °Have a long-term (chronic) condition, such as: °Heart, kidney, or lung disease. °Diabetes. °Asthma. °Have a liver disorder. °Are very overweight (morbidly obese). °Have anemia. °What are the signs or symptoms? °Symptoms usually begin suddenly and last 4-14 days. They may include: °Fever and chills. °Headaches, body aches, or muscle aches. °Sore throat. °Cough. °Runny or stuffy (congested) nose. °Feeling discomfort in your chest. °Not wanting to eat as much as normal. °Feeling weak or tired. °Feeling dizzy. °Feeling sick to your stomach or throwing up. °How is this treated? °If the flu is found early, you can be treated with antiviral medicine. This can help to reduce how bad the illness is and how long it lasts. This may be given by mouth or through an IV tube. °Taking care of yourself at home can help your  symptoms get better. Your doctor may want you to: °Take over-the-counter medicines. °Drink plenty of fluids. °The flu often goes away on its own. If you have very bad symptoms or other problems, you may be treated in a hospital. °Follow these instructions at home: °  °Activity °Rest as needed. Get plenty of sleep. °Stay home from work or school as told by your doctor. °Do not leave home until you do not have a fever for 24 hours without taking medicine. °Leave home only to go to your doctor. °Eating and drinking °Take an ORS (oral rehydration solution). This is a drink that is sold at pharmacies and stores. °Drink enough fluid to keep your pee pale yellow. °Drink clear fluids in small amounts as you are able. Clear fluids include: °Water. °Ice chips. °Fruit juice mixed with water. °Low-calorie sports drinks. °Eat bland foods that are easy to digest. Eat small amounts as you are able. These foods include: °Bananas. °Applesauce. °Rice. °Lean meats. °Toast. °Crackers. °Do not eat or drink: °Fluids that have a lot of sugar or caffeine. °Alcohol. °Spicy or fatty foods. °General instructions °Take over-the-counter and prescription medicines only as told by your doctor. °Use a cool mist humidifier to add moisture to the air in your home. This can make it easier for you to breathe. °When using a cool mist humidifier, clean it daily. Empty water and replace with clean water. °Cover your mouth and nose when you cough or sneeze. °Wash your hands with soap and water often and for at least 20 seconds. This is also important after   you cough or sneeze. If you cannot use soap and water, use alcohol-based hand sanitizer. °Keep all follow-up visits. °How is this prevented? ° °Get a flu shot every year. You may get the flu shot in late summer, fall, or winter. Ask your doctor when you should get your flu shot. °Avoid contact with people who are sick during fall and winter. This is cold and flu season. °Contact a doctor if: °You get  new symptoms. °You have: °Chest pain. °Watery poop (diarrhea). °A fever. °Your cough gets worse. °You start to have more mucus. °You feel sick to your stomach. °You throw up. °Get help right away if you: °Have shortness of breath. °Have trouble breathing. °Have skin or nails that turn a bluish color. °Have very bad pain or stiffness in your neck. °Get a sudden headache. °Get sudden pain in your face or ear. °Cannot eat or drink without throwing up. °These symptoms may represent a serious problem that is an emergency. Get medical help right away. Call your local emergency services (911 in the [place] °Do not wait to see if the symptoms will go away. °Do not drive yourself to the hospital. °Summary °Influenza is also called "the flu." It is an infection in the lungs, nose, and throat. It spreads easily from person to person. °Take over-the-counter and prescription medicines only as told by your doctor. °Getting a flu shot every year is the best way to not get the flu. °This information is not intended to replace advice given to you by your health care provider. Make sure you discuss any questions you have with your health care provider. °Document Revised: 03/05/2020 Document Reviewed: 03/05/2020 °Elsevier Patient Education © 2022 [employer] ° °

## 2021-06-04 ENCOUNTER — Other Ambulatory Visit: Payer: Self-pay | Admitting: Internal Medicine

## 2021-06-05 ENCOUNTER — Telehealth: Payer: Self-pay | Admitting: Internal Medicine

## 2021-06-06 NOTE — Telephone Encounter (Signed)
Pt calling stating that he is completely out of both of these medications. He states that they were helping, but is starting to feel badly again without them. Please advise.

## 2021-06-06 NOTE — Telephone Encounter (Signed)
We don't typically refill medicine like this. If he is worse, he needs to be reevaluated.

## 2021-06-06 NOTE — Telephone Encounter (Signed)
The pt was scheduled for an appt with Rene Kocher on tomorrow.

## 2021-06-07 ENCOUNTER — Ambulatory Visit
Admission: RE | Admit: 2021-06-07 | Discharge: 2021-06-07 | Disposition: A | Discharge status: Home / Self Care | Payer: Medicaid Other | Source: Ambulatory Visit | Attending: Internal Medicine | Admitting: Internal Medicine

## 2021-06-07 ENCOUNTER — Encounter: Payer: Self-pay | Admitting: Internal Medicine

## 2021-06-07 ENCOUNTER — Ambulatory Visit: Payer: Medicaid Other | Admitting: Internal Medicine

## 2021-06-07 ENCOUNTER — Other Ambulatory Visit: Payer: Self-pay

## 2021-06-07 ENCOUNTER — Ambulatory Visit
Admission: RE | Admit: 2021-06-07 | Discharge: 2021-06-07 | Disposition: A | Discharge status: Home / Self Care | Payer: Medicaid Other | Attending: Internal Medicine | Admitting: Internal Medicine

## 2021-06-07 ENCOUNTER — Telehealth: Payer: Medicaid Other | Admitting: Internal Medicine

## 2021-06-07 DIAGNOSIS — J439 Emphysema, unspecified: Secondary | ICD-10-CM

## 2021-06-07 DIAGNOSIS — I2510 Atherosclerotic heart disease of native coronary artery without angina pectoris: Secondary | ICD-10-CM

## 2021-06-07 DIAGNOSIS — J449 Chronic obstructive pulmonary disease, unspecified: Secondary | ICD-10-CM | POA: Diagnosis not present

## 2021-06-07 DIAGNOSIS — R6889 Other general symptoms and signs: Secondary | ICD-10-CM | POA: Insufficient documentation

## 2021-06-07 DIAGNOSIS — R059 Cough, unspecified: Secondary | ICD-10-CM | POA: Diagnosis not present

## 2021-06-07 DIAGNOSIS — I214 Non-ST elevation (NSTEMI) myocardial infarction: Secondary | ICD-10-CM | POA: Diagnosis not present

## 2021-06-07 DIAGNOSIS — R0602 Shortness of breath: Secondary | ICD-10-CM | POA: Diagnosis not present

## 2021-06-07 MED ORDER — PREDNISONE 10 MG PO TABS: ORAL_TABLET | ORAL | 0 refills | Status: DC

## 2021-06-07 MED ORDER — HYDROCODONE BIT-HOMATROP MBR 5-1.5 MG/5ML PO SOLN: 5.0000 mL | Freq: Three times a day (TID) | ORAL | 0 refills | Status: DC | PRN

## 2021-06-07 NOTE — Patient Instructions (Signed)

## 2021-06-07 NOTE — Progress Notes (Signed)
Subjective:    Patient ID: Bryan Ibarra., male    DOB: [birth year], 64 y.o.   MRN: [id number]  HPI  Patient presents the clinic today for follow-up of flulike symptoms.  He reports headache, nasal congestion, cough, shortness of breath, chills and body aches.  The symptoms started 2 weeks ago.  He was seen 11/2 for the same, started on Prednisone and Hycodan which he reports improved while he was taking this medication and then worsened once he stopped taking the medication.  He describes the headache as pressure which he attributes to excessive coughing.  He is blowing clear mucus out of his nose.  The cough is productive of clear mucus.  He reports the shortness of breath is worse with exertion.  He reports some associated chest pressure with this but has a history of CAD status post MI and reports this feels a little different.  He has a history of emphysema, managed on Singulair, Advair and Albuterol.  He does not feel like these medications are effective and thinks he may need oxygen.  He does continue to smoke at this time.  He does not follow with pulmonology.  Review of Systems     Past Medical History:  Diagnosis Date   Alcohol abuse    a. up to a 12 pack of beer/day.   Arthritis    CAD in native artery    a. LHC 6/18: severe 1 vessel CAD with 90% thrombotic stenosis in the proximal LAD which was felt to be the culprit for the patient's non-STEMI. He underwent successful PCI/DES with a resolute onyx drug-eluting stent (3.0 x 18 mm). There was 0% residual stenosis. LVEF estimated at 55-65% by visual estimate. LVEDP was mildly elevated   H/O echocardiogram    a. echo 6/18: EF 60-65%, normal wall motion, grade 1 diastolic dysfunction, RV cavity size was normal with normal wall thickness and normal RV systolic function. There were no significant valvular abnormalities   Hearing loss    Heart attack (HCC)    Hypertension    Marijuana abuse    a. occasional.   Tobacco abuse      Current Outpatient Medications  Medication Sig Dispense Refill   albuterol (VENTOLIN HFA) 108 (90 Base) MCG/ACT inhaler INHALE 1 TO 2 PUFFS INTO THE LUNGS EVERY 6 HOURS AS NEEDED FOR WHEEZING OR SHORTNESS OF BREATH 8.5 g 5   amLODipine (NORVASC) 5 MG tablet Take 1 tablet (5 mg total) by mouth daily. 90 tablet 1   aspirin EC 81 MG tablet Take 1 tablet (81 mg total) by mouth daily. Hold for 3 days. 30 tablet 0   carvedilol (COREG) 6.25 MG tablet TAKE 1 TABLET(6.25 MG) BY MOUTH TWICE DAILY WITH A MEAL 180 tablet 1   dextromethorphan-guaiFENesin (MUCINEX DM) 30-600 MG 12hr tablet Take 1 tablet by mouth 2 (two) times daily.     fluticasone-salmeterol (ADVAIR DISKUS) 100-50 MCG/ACT AEPB Inhale 1 puff into the lungs 2 (two) times daily. 60 each 5   HYDROcodone bit-homatropine (HYCODAN) 5-1.5 MG/5ML syrup Take 5 mLs by mouth every 8 (eight) hours as needed for cough. 120 mL 0   lisinopril (ZESTRIL) 40 MG tablet TAKE 1 TABLET(40 MG) BY MOUTH DAILY 90 tablet 1   Melatonin 10 MG TABS Take 2 tablets by mouth daily.     montelukast (SINGULAIR) 10 MG tablet TAKE 1 TABLET(10 MG) BY MOUTH AT BEDTIME 90 tablet 1   predniSONE (DELTASONE) 10 MG tablet Take 6 tabs on day  1, 5 tabs on day 2, 4 tabs on day 3, 3 tabs on day 4, 2 tabs on day 5, 1 tab on day 6 21 tablet 0   rosuvastatin (CRESTOR) 10 MG tablet TAKE 1 TABLET(10 MG) BY MOUTH DAILY 90 tablet 1   sildenafil (REVATIO) 20 MG tablet Take 1-5 pills about 30 min prior to sex. Start with 1 and increase as needed. 90 tablet 3   No current facility-administered medications for this visit.    No Known Allergies  Family History  Problem Relation Age of Onset   CAD Father        s/p CABG in his 39's or 66's.   Diabetes Father    CAD Paternal Grandfather    Colon polyps Brother     Social History   Socioeconomic History   Marital status: Divorced    Spouse name: Not on file   Number of children: Not on file   Years of education: Not on file    Highest education level: High school graduate  Occupational History   Not on file  Tobacco Use   Smoking status: Some Days    Packs/day: 0.25    Years: 42.00    Pack years: 10.50    Types: Cigarettes   Smokeless tobacco: Never   Tobacco comments:    smokes 5 cigarettes daily.   Vaping Use   Vaping Use: Never used  Substance and Sexual Activity   Alcohol use: Yes    Comment: 12 beers per week   Drug use: Not Currently    Types: Marijuana    Comment: occasional MJ.  Remote cocaine.   Sexual activity: Yes  Other Topics Concern   Not on file  Social History Narrative   Lives in [place].  He lives in a mobile home in the backyard of his girlfriend.  He works in Aeronautical engineer.   Social Determinants of Health   Financial Resource Strain: Not on file  Food Insecurity: Not on file  Transportation Needs: Not on file  Physical Activity: Not on file  Stress: Not on file  Social Connections: Not on file  Intimate Partner Violence: Not on file     Constitutional: Patient reports headache, fatigue, chills.  Denies fever, malaise, or abrupt weight changes.  HEENT: Patient reports nasal congestion.  Denies eye pain, eye redness, ear pain, ringing in the ears, wax buildup, runny nose, bloody nose, or sore throat. Respiratory: Patient reports cough and shortness of breath.  Denies difficulty breathing.   Cardiovascular: Patient reports chest pressure.  Denies chest pain, chest tightness, palpitations or swelling in the hands or feet.  Musculoskeletal: Patient reports body aches.  Denies decrease in range of motion, difficulty with gait, or joint pain and swelling.  Neurological: Denies dizziness, difficulty with memory, difficulty with speech or problems with balance and coordination.    No other specific complaints in a complete review of systems (except as listed in HPI above).  Objective:   Physical Exam   Wt Readings from Last 3 Encounters:  06/07/21 250 lb 6.4 oz (113.6 kg)   03/29/21 261 lb (118.4 kg)  03/02/21 264 lb 6.4 oz (119.9 kg)    General: Appears his stated age, obese, in NAD. Skin: Warm, dry and intact.  HEENT: Head: normal shape and size; Eyes: sclera white and EOMs intact;  Throat/Mouth: Teeth present, mucosa pink and moist, no exudate, lesions or ulcerations noted.  Neck: No adenopathy noted. Cardiovascular: Normal rate and rhythm. S1,S2 noted.  No murmur,  rubs or gallops noted.  Pulmonary/Chest: Normal effort and diminished breath sounds. No respiratory distress. No wheezes, rales or ronchi noted.  Musculoskeletal: No difficulty with gait.  Neurological: Alert and oriented.   BMET    Component Value Date/Time   NA 136 07/01/2020 1418   NA 141 01/15/2017 1407   K 3.7 07/01/2020 1418   CL 99 07/01/2020 1418   CO2 23 07/01/2020 1418   GLUCOSE 110 (H) 07/01/2020 1418   BUN 11 07/01/2020 1418   BUN 14 01/15/2017 1407   CREATININE 0.93 07/01/2020 1418   CREATININE 1.13 01/30/2020 1115   CALCIUM 9.6 07/01/2020 1418   GFRNONAA >60 07/01/2020 1418   GFRNONAA 69 01/30/2020 1115   GFRAA 80 01/30/2020 1115    Lipid Panel     Component Value Date/Time   CHOL 153 01/30/2020 1115   TRIG 142 01/30/2020 1115   HDL 76 01/30/2020 1115   CHOLHDL 2.0 01/30/2020 1115   VLDL 12 06/24/2018 1725   LDLCALC 54 01/30/2020 1115    CBC    Component Value Date/Time   WBC 7.2 07/01/2020 1418   RBC 4.93 07/01/2020 1418   HGB 15.8 07/01/2020 1418   HCT 45.3 07/01/2020 1418   PLT 213 07/01/2020 1418   MCV 91.9 07/01/2020 1418   MCH 32.0 07/01/2020 1418   MCHC 34.9 07/01/2020 1418   RDW 11.9 07/01/2020 1418   LYMPHSABS 1,398 01/30/2020 1115   MONOABS 0.5 08/02/2019 0703   EOSABS 68 01/30/2020 1115   BASOSABS 53 01/30/2020 1115    Hgb A1C Lab Results  Component Value Date   HGBA1C 5.3 01/01/2017            Assessment & Plan:   Flu-Like Symptoms, COPD, CAD s/p MI:  Deteriorated but in NAD Chest x-ray today Flu/COVID/RSV  swab Refilled prednisone x6 days Refilled Hycodan x6 days Consider referral to pulmonology for further evaluation of COPD and medication management We will have him follow-up with his cardiologist regarding his intermittent chest pressure  We will follow-up after labs with further recommendation and treatment plan   Bryan Reaper, NP This visit occurred during the SARS-CoV-2 public health emergency.  Safety protocols were in place, including screening questions prior to the visit, additional usage of staff PPE, and extensive cleaning of exam room while observing appropriate contact time as indicated for disinfecting solutions.

## 2021-06-08 LAB — COVID-19, FLU A+B AND RSV
Influenza A, NAA: NOT DETECTED
Influenza B, NAA: NOT DETECTED
RSV, NAA: NOT DETECTED
SARS-CoV-2, NAA: NOT DETECTED

## 2021-06-08 LAB — SPECIMEN STATUS REPORT

## 2021-06-08 NOTE — Progress Notes (Signed)
Pt came in person for mychart visit, had to be changed to office visit

## 2021-06-10 ENCOUNTER — Telehealth: Payer: Self-pay

## 2021-06-10 DIAGNOSIS — R918 Other nonspecific abnormal finding of lung field: Secondary | ICD-10-CM

## 2021-06-10 DIAGNOSIS — J449 Chronic obstructive pulmonary disease, unspecified: Secondary | ICD-10-CM

## 2021-06-10 NOTE — Telephone Encounter (Signed)
Copied from CRM [id number]. Topic: General - Other >> Jun 09, 2021  8:10 AM Jaquita Rector A wrote: Reason for CRM: Patient called in to inquire of Nicki Reaper about the next step from his lung Xray that he recently say that he was wondering about the more in dept scan of the lung and what exactly the next step will be. Please call patient at  Ph# [phone]

## 2021-06-10 NOTE — Telephone Encounter (Signed)
He needs to have another lung cancer screening CT done for new nodules in the LLL. Previous nodules were in the RLL. He has know COPD. Continue inhalers. If breathing worse, will need to see pulmonology.

## 2021-06-10 NOTE — Telephone Encounter (Signed)
Attempted to contact the patient. No answer. LMOM to return my call.  

## 2021-06-13 NOTE — Addendum Note (Signed)
Addended by: Lorre Munroe on: 06/13/2021 08:24 AM   Modules accepted: Orders

## 2021-06-13 NOTE — Telephone Encounter (Signed)
The pt would like to proceed with the CT scan and the referral to pulmonology.

## 2021-06-13 NOTE — Telephone Encounter (Signed)
CT ordered and referral to pulmonology placed

## 2021-06-20 ENCOUNTER — Other Ambulatory Visit: Payer: Self-pay | Admitting: *Deleted

## 2021-06-20 DIAGNOSIS — F17210 Nicotine dependence, cigarettes, uncomplicated: Secondary | ICD-10-CM

## 2021-07-11 ENCOUNTER — Other Ambulatory Visit: Payer: Self-pay | Admitting: Cardiovascular Disease

## 2021-07-11 ENCOUNTER — Other Ambulatory Visit: Payer: Self-pay | Admitting: Family

## 2021-07-11 ENCOUNTER — Telehealth: Payer: Self-pay | Admitting: Cardiovascular Disease

## 2021-07-11 DIAGNOSIS — I10 Essential (primary) hypertension: Secondary | ICD-10-CM

## 2021-07-11 MED ORDER — LISINOPRIL 40 MG PO TABS: ORAL_TABLET | ORAL | 0 refills | Status: DC

## 2021-07-11 MED ORDER — AMLODIPINE BESYLATE 5 MG PO TABS: 5.0000 mg | ORAL_TABLET | Freq: Every day | ORAL | 0 refills | Status: DC

## 2021-07-11 NOTE — Telephone Encounter (Signed)
Requested Prescriptions   Signed Prescriptions Disp Refills   amLODipine (NORVASC) 5 MG tablet 90 tablet 0    Sig: Take 1 tablet (5 mg total) by mouth daily.    Authorizing Provider: Lorine Bears A    Ordering User: Iverson Alamin C   lisinopril (ZESTRIL) 40 MG tablet 90 tablet 0    Sig: TAKE 1 TABLET(40 MG) BY MOUTH DAILY    Authorizing Provider: Lorine Bears A    Ordering User: Kendrick Fries

## 2021-07-11 NOTE — Telephone Encounter (Signed)
*  STAT* If patient is at the pharmacy, call can be transferred to refill team.   1. Which medications need to be refilled? (please list name of each medication and dose if known)  lisinopril 40 MG 1 tablet daily  Amlodipine 5 MG 1 tablet daily   2. Which pharmacy/location (including street and city if local pharmacy) is medication to be sent to? [facility] in [place]   3. Do they need a 30 day or 90 day supply? 90 day

## 2021-07-18 ENCOUNTER — Ambulatory Visit (INDEPENDENT_AMBULATORY_CARE_PROVIDER_SITE_OTHER): Payer: Medicaid Other | Admitting: Acute Care

## 2021-07-18 ENCOUNTER — Other Ambulatory Visit: Payer: Self-pay

## 2021-07-18 ENCOUNTER — Encounter: Payer: Self-pay | Admitting: Acute Care

## 2021-07-18 DIAGNOSIS — R918 Other nonspecific abnormal finding of lung field: Secondary | ICD-10-CM

## 2021-07-18 DIAGNOSIS — F17210 Nicotine dependence, cigarettes, uncomplicated: Secondary | ICD-10-CM

## 2021-07-18 DIAGNOSIS — J449 Chronic obstructive pulmonary disease, unspecified: Secondary | ICD-10-CM

## 2021-07-18 NOTE — Progress Notes (Signed)
Virtual Visit via Video Note  I connected with Bryan Ibarra. on 07/18/21 at  4:00 PM EST by a video enabled telemedicine application and verified that I am speaking with the correct person using two identifiers.  Location: Patient: At home Provider: [address], [place], [place], [address]    I discussed the limitations of evaluation and management by telemedicine and the availability of in person appointments. The patient expressed understanding and agreed to proceed.   Shared Decision Making Visit Lung Cancer Screening Program [phone])   Eligibility: Age 65 y.o. Pack Years Smoking History Calculation 50 pack year smoking history (# packs/per year x # years smoked) Recent History of coughing up blood  no Unexplained weight loss? no ( >Than 15 pounds within the last 6 months ) Prior History Lung / other cancer no (Diagnosis within the last 5 years already requiring surveillance chest CT Scans). Smoking Status Current Smoker Former Smokers: Years since quit:  NA  Quit Date: NA  Visit Components: Discussion included one or more decision making aids. yes Discussion included risk/benefits of screening. yes Discussion included potential follow up diagnostic testing for abnormal scans. yes Discussion included meaning and risk of over diagnosis. yes Discussion included meaning and risk of False Positives. yes Discussion included meaning of total radiation exposure. yes  Counseling Included: Importance of adherence to annual lung cancer LDCT screening. yes Impact of comorbidities on ability to participate in the program. yes Ability and willingness to under diagnostic treatment. yes  Smoking Cessation Counseling: Current Smokers:  Discussed importance of smoking cessation. yes Information about tobacco cessation classes and interventions provided to patient. yes Patient provided with "ticket" for LDCT Scan. yes Symptomatic Patient. no  Counseling Diagnosis Code:  Tobacco Use Z72.0 Asymptomatic Patient yes  Counseling (Intermediate counseling: > three minutes counseling) N8676 Former Smokers:  Discussed the importance of maintaining cigarette abstinence. yes Diagnosis Code: Personal History of Nicotine Dependence. H20.947 Information about tobacco cessation classes and interventions provided to patient. Yes Patient provided with "ticket" for LDCT Scan. yes Written Order for Lung Cancer Screening with LDCT placed in Epic. Yes (CT Chest Lung Cancer Screening Low Dose W/O CM) [id number] Z12.2-Screening of respiratory organs Z87.891-Personal history of nicotine dependence  I have spent 25 minutes of face to face/ virtual visit   time with Bryan Ibarra discussing the risks and benefits of lung cancer screening. We viewed / discussed a power point together that explained in detail the above noted topics. We paused at intervals to allow for questions to be asked and answered to ensure understanding.We discussed that the single most powerful action that he can take to decrease his risk of developing lung cancer is to quit smoking. We discussed whether or not he is ready to commit to setting a quit date. We discussed options for tools to aid in quitting smoking including nicotine replacement therapy, non-nicotine medications, support groups, Quit Smart classes, and behavior modification. We discussed that often times setting smaller, more achievable goals, such as eliminating 1 cigarette a day for a week and then 2 cigarettes a day for a week can be helpful in slowly decreasing the number of cigarettes smoked. This allows for a sense of accomplishment as well as providing a clinical benefit. I provided  him  with smoking cessation  information  with contact information for community resources, classes, free nicotine replacement therapy, and access to mobile apps, text messaging, and on-line smoking cessation help. I have also provided  him  the office  contact information in  the event he needs to contact me, or the screening staff. We discussed the time and location of the scan, and that either Abigail Miyamoto RN, Karlton Lemon, RN  or I will call / send a letter with the results within 24-72 hours of receiving them. The patient verbalized understanding of all of  the above and had no further questions upon leaving the office. They have my contact information in the event they have any further questions.  I spent 3 minutes counseling on smoking cessation and the health risks of continued tobacco abuse.  I explained to the patient that there has been a high incidence of coronary artery disease noted on these exams. I explained that this is a non-gated exam therefore degree or severity cannot be determined. This patient is currently  on statin therapy. I have asked the patient to follow-up with their PCP regarding any incidental finding of coronary artery disease and management with diet or medication as their PCP  feels is clinically indicated. The patient verbalized understanding of the above and had no further questions upon completion of the visit.      Bevelyn Ngo, NP 07/18/2021

## 2021-07-18 NOTE — Patient Instructions (Signed)
Thank you for participating in the [facility] Screening [facility]. °It was our pleasure to meet you today. °We will call you with the results of your scan within the next few days. °Your scan will be assigned a Lung RADS category score by the physicians reading the scans.  °This Lung RADS score determines follow up scanning.  °See below for description of categories, and follow up screening recommendations. °We will be in touch to schedule your follow up screening annually or based on recommendations of our providers. °We will fax a copy of your scan results to your Primary Care Physician, or the physician who referred you to the program, to ensure they have the results. °Please call the office if you have any questions or concerns regarding your scanning experience or results.  °Our office number is [phone]. °Please speak with Denise Phelps, RN. She is our Lung Cancer Screening RN. °If she is unavailable when you call, please have the office staff send her a message. She will return your call at her earliest convenience. °Remember, if your scan is normal, we will scan you annually as long as you continue to meet the criteria for the program. (Age 55-77, Current smoker or smoker who has quit within the last 15 years). °If you are a smoker, remember, quitting is the single most powerful action that you can take to decrease your risk of lung cancer and other pulmonary, breathing related problems. °We know quitting is hard, and we are here to help.  °Please let us know if there is anything we can do to help you meet your goal of quitting. °If you are a former smoker, congratulations. We are proud of you! Remain smoke free! °Remember you can refer friends or family members through the number above.  °We will screen them to make sure they meet criteria for the program. °Thank you for helping us take better care of you by participating in Lung Screening. ° °You can receive free nicotine replacement therapy  ( patches, gum or mints) by calling 1-800-QUIT NOW. Please call so we can get you on the path to becoming  a non-smoker. I know it is hard, but you can do this! ° °Lung RADS Categories: ° °Lung RADS 1: no nodules or definitely non-concerning nodules.  °Recommendation is for a repeat annual scan in 12 months. ° °Lung RADS 2:  nodules that are non-concerning in appearance and behavior with a very low likelihood of becoming an active cancer. °Recommendation is for a repeat annual scan in 12 months. ° °Lung RADS 3: nodules that are probably non-concerning , includes nodules with a low likelihood of becoming an active cancer.  Recommendation is for a 6-month repeat screening scan. Often noted after an upper respiratory illness. We will be in touch to make sure you have no questions, and to schedule your 6-month scan. ° °Lung RADS 4 A: nodules with concerning findings, recommendation is most often for a follow up scan in 3 months or additional testing based on our provider's assessment of the scan. We will be in touch to make sure you have no questions and to schedule the recommended 3 month follow up scan. ° °Lung RADS 4 B:  indicates findings that are concerning. We will be in touch with you to schedule additional diagnostic testing based on our provider's  assessment of the scan. ° °Hypnosis for smoking cessation  °[employer] °[phone] ° °Acupuncture for smoking cessation  °[facility] °[phone]  °

## 2021-07-19 ENCOUNTER — Ambulatory Visit
Admission: RE | Admit: 2021-07-19 | Discharge: 2021-07-19 | Disposition: A | Discharge status: Home / Self Care | Payer: Medicaid Other | Source: Ambulatory Visit | Attending: Acute Care | Admitting: Acute Care

## 2021-07-19 DIAGNOSIS — F17210 Nicotine dependence, cigarettes, uncomplicated: Secondary | ICD-10-CM | POA: Insufficient documentation

## 2021-07-26 ENCOUNTER — Other Ambulatory Visit: Payer: Self-pay | Admitting: Internal Medicine

## 2021-07-26 ENCOUNTER — Other Ambulatory Visit: Payer: Self-pay | Admitting: Acute Care

## 2021-07-26 ENCOUNTER — Other Ambulatory Visit: Payer: Self-pay | Admitting: Cardiovascular Disease

## 2021-07-26 DIAGNOSIS — J4530 Mild persistent asthma, uncomplicated: Secondary | ICD-10-CM

## 2021-07-26 DIAGNOSIS — F17210 Nicotine dependence, cigarettes, uncomplicated: Secondary | ICD-10-CM

## 2021-07-26 DIAGNOSIS — Z87891 Personal history of nicotine dependence: Secondary | ICD-10-CM

## 2021-07-26 NOTE — Telephone Encounter (Signed)
Medication: albuterol (VENTOLIN HFA) 108 (90 Base) MCG/ACT inhaler [[id number]]   Has the patient contacted their pharmacy? YES advised to contact the office (Agent: If no, request that the patient contact the pharmacy for the refill. If patient does not wish to contact the pharmacy document the reason why and proceed with request.) (Agent: If yes, when and what did the pharmacy advise?)  Preferred Pharmacy (with phone number or street name): [facility][id number] [place], [place] - [address] AT [facility] OF [address] [place] [address] [place] [address] Phone: [phone] Fax: [phone] Hours: Not open 24 hours   Has the patient been seen for an appointment in the last year OR does the patient have an upcoming appointment? YES 05/2021  Agent: Please be advised that RX refills may take up to 3 business days. We ask that you follow-up with your pharmacy.

## 2021-07-27 ENCOUNTER — Other Ambulatory Visit: Payer: Self-pay | Admitting: Family Medicine

## 2021-07-27 DIAGNOSIS — J4530 Mild persistent asthma, uncomplicated: Secondary | ICD-10-CM

## 2021-07-27 NOTE — Telephone Encounter (Signed)
Requested medications are due for refill today.  Seems too soon  Requested medications are on the active medications list.  yes  Last refill. 03/02/2021  Future visit scheduled.   no  Notes to clinic.  Medication was refilled 03/02/2021 8.5g, with 5 refills. Pt is using more than 1 inhaler a month. Please advise.    Requested Prescriptions  Pending Prescriptions Disp Refills   albuterol (VENTOLIN HFA) 108 (90 Base) MCG/ACT inhaler 8.5 g 5    Sig: INHALE 1 TO 2 PUFFS INTO THE LUNGS EVERY 6 HOURS AS NEEDED FOR WHEEZING OR SHORTNESS OF BREATH     Pulmonology:  Beta Agonists Failed - 07/26/2021 11:40 AM      Failed - One inhaler should last at least one month. If the patient is requesting refills earlier, contact the patient to check for uncontrolled symptoms.      Passed - Valid encounter within last 12 months    Recent Outpatient Visits           1 month ago Flu-like symptoms   [facility] [place], Salvadore Oxford, NP   1 month ago Erroneous encounter - disregard   [facility] [place], Salvadore Oxford, NP   1 month ago Flu-like symptoms   [facility] [place], [place], NP   4 months ago Mild persistent asthma without complication   [facility] Smitty Cords, DO   8 months ago Acute bronchitis, unspecified organism   [facility] [place] Charon, Netta Neat, DO       Future Appointments             In 2 months Kirke Corin, Chelsea Aus, MD [facility], [facility]

## 2021-07-28 NOTE — Telephone Encounter (Signed)
Requested Prescriptions  Pending Prescriptions Disp Refills   albuterol (VENTOLIN HFA) 108 (90 Base) MCG/ACT inhaler [Pharmacy Med Name: ALBUTEROL HFA INH (200 PUFFS)8.5GM] 8.5 g 5    Sig: INHALE 1 TO 2 PUFFS INTO THE LUNGS EVERY 6 HOURS AS NEEDED FOR WHEEZING OR SHORTNESS OF BREATH     Pulmonology:  Beta Agonists Failed - 07/27/2021  1:43 PM      Failed - One inhaler should last at least one month. If the patient is requesting refills earlier, contact the patient to check for uncontrolled symptoms.      Passed - Valid encounter within last 12 months    Recent Outpatient Visits          1 month ago Flu-like symptoms   [facility] [place], Salvadore Oxford, NP   1 month ago Erroneous encounter - disregard   [facility] [place], Salvadore Oxford, NP   1 month ago Flu-like symptoms   [facility] [place], [place], NP   4 months ago Mild persistent asthma without complication   [facility] Smitty Cords, DO   8 months ago Acute bronchitis, unspecified organism   [facility] Althea Charon, Netta Neat, DO      Future Appointments            In 2 months Kirke Corin, Chelsea Aus, MD [facility], [facility]

## 2021-08-23 ENCOUNTER — Other Ambulatory Visit: Payer: Self-pay

## 2021-08-23 DIAGNOSIS — I10 Essential (primary) hypertension: Secondary | ICD-10-CM

## 2021-08-23 MED ORDER — LISINOPRIL 40 MG PO TABS: ORAL_TABLET | ORAL | 0 refills | Status: DC

## 2021-08-26 ENCOUNTER — Other Ambulatory Visit: Payer: Self-pay | Admitting: Internal Medicine

## 2021-08-26 DIAGNOSIS — J4530 Mild persistent asthma, uncomplicated: Secondary | ICD-10-CM

## 2021-08-26 NOTE — Telephone Encounter (Signed)
Call to pharmacy-2x- call was disconnected by pharmacy twice after long wait.  Patient should have RF- Rx 05/27/21 #90 1RF Requested Prescriptions  Pending Prescriptions Disp Refills   montelukast (SINGULAIR) 10 MG tablet 90 tablet 1    Sig: TAKE 1 TABLET(10 MG) BY MOUTH AT BEDTIME Strength: 10 mg     Pulmonology:  Leukotriene Inhibitors Passed - 08/26/2021  1:40 PM      Passed - Valid encounter within last 12 months    Recent Outpatient Visits          2 months ago Flu-like symptoms   [facility] [place], Salvadore Oxford, NP   2 months ago Erroneous encounter - disregard   [facility] [place], Salvadore Oxford, NP   2 months ago Flu-like symptoms   [facility] [place], [place], NP   5 months ago Mild persistent asthma without complication   [facility] Smitty Cords, DO   9 months ago Acute bronchitis, unspecified organism   [facility] [place], Netta Neat, DO      Future Appointments            In 1 month Kirke Corin, Chelsea Aus, MD [facility], [facility]

## 2021-08-26 NOTE — Telephone Encounter (Signed)
Medication Refill - Medication: montelukast (SINGULAIR) 10 MG tablet Pt stated he has 4 pills left.   Has the patient contacted their pharmacy? Yes.   Pharmacy advised pt to contact PCP.   (Agent: If yes, when and what did the pharmacy advise?)  Preferred Pharmacy (with phone number or street name):  [facility] #[id number] [place], [place] - [address] AT [facility] OF [address] [place]  [address] [place] [address]  Phone: [phone] Fax: [phone]  Hours: Not open 24 hours   Has the patient been seen for an appointment in the last year OR does the patient have an upcoming appointment? Yes.    Agent: Please be advised that RX refills may take up to 3 business days. We ask that you follow-up with your pharmacy.

## 2021-08-30 ENCOUNTER — Other Ambulatory Visit: Payer: Self-pay | Admitting: Internal Medicine

## 2021-08-30 DIAGNOSIS — J4530 Mild persistent asthma, uncomplicated: Secondary | ICD-10-CM

## 2021-08-30 NOTE — Telephone Encounter (Signed)
Has current rx at different pharm. Requested Prescriptions  Pending Prescriptions Disp Refills   montelukast (SINGULAIR) 10 MG tablet 90 tablet 1     Pulmonology:  Leukotriene Inhibitors Passed - 08/30/2021  1:33 PM      Passed - Valid encounter within last 12 months    Recent Outpatient Visits          2 months ago Flu-like symptoms   [facility] [place], Coralie Keens, NP   2 months ago Erroneous encounter - disregard   [facility] [place], Coralie Keens, NP   3 months ago Flu-like symptoms   [facility] [place], [place], NP   6 months ago Mild persistent asthma without complication   [place], DO   9 months ago Acute bronchitis, unspecified organism   [place], DO      Future Appointments            In 1 month Fletcher Anon, Mertie Clause, MD [facility], [place]

## 2021-08-30 NOTE — Telephone Encounter (Signed)
Medication Refill - Medication:  montelukast (SINGULAIR) 10 MG tablet   Has the patient contacted their pharmacy? Yes.   Contact PCP- It was stated that it was to soon, but pt states he is now out of the medication.  Preferred Pharmacy (with phone number or street name):  [id number] [place], [place] - [address] Phone:  [phone]  Fax:  [phone]      Has the patient been seen for an appointment in the last year OR does the patient have an upcoming appointment? Yes.    Agent: Please be advised that RX refills may take up to 3 business days. We ask that you follow-up with your pharmacy.

## 2021-09-16 ENCOUNTER — Other Ambulatory Visit: Payer: Self-pay | Admitting: Family

## 2021-09-16 DIAGNOSIS — I10 Essential (primary) hypertension: Secondary | ICD-10-CM

## 2021-09-16 DIAGNOSIS — I25118 Atherosclerotic heart disease of native coronary artery with other forms of angina pectoris: Secondary | ICD-10-CM

## 2021-09-16 NOTE — Telephone Encounter (Signed)
Rx(s) sent to pharmacy electronically.  

## 2021-10-04 ENCOUNTER — Encounter: Payer: Self-pay | Admitting: Cardiovascular Disease

## 2021-10-04 ENCOUNTER — Ambulatory Visit (INDEPENDENT_AMBULATORY_CARE_PROVIDER_SITE_OTHER): Payer: Medicare Other | Admitting: Cardiovascular Disease

## 2021-10-04 ENCOUNTER — Other Ambulatory Visit: Payer: Self-pay

## 2021-10-04 VITALS — BP 112/80 | HR 73 | Ht 74.0 in | Wt 255.0 lb

## 2021-10-04 DIAGNOSIS — I10 Essential (primary) hypertension: Secondary | ICD-10-CM | POA: Diagnosis not present

## 2021-10-04 DIAGNOSIS — Z720 Tobacco use: Secondary | ICD-10-CM

## 2021-10-04 DIAGNOSIS — E785 Hyperlipidemia, unspecified: Secondary | ICD-10-CM | POA: Diagnosis not present

## 2021-10-04 DIAGNOSIS — I2510 Atherosclerotic heart disease of native coronary artery without angina pectoris: Secondary | ICD-10-CM | POA: Diagnosis not present

## 2021-10-04 MED ORDER — LISINOPRIL 40 MG PO TABS: ORAL_TABLET | ORAL | 2 refills | Status: DC

## 2021-10-04 MED ORDER — AMLODIPINE BESYLATE 5 MG PO TABS: 5.0000 mg | ORAL_TABLET | Freq: Every day | ORAL | 2 refills | Status: DC

## 2021-10-04 MED ORDER — ROSUVASTATIN CALCIUM 10 MG PO TABS: ORAL_TABLET | ORAL | 2 refills | Status: DC

## 2021-10-04 NOTE — Progress Notes (Signed)
?  ?Cardiology Office Note ? ? ?Date:  10/04/2021  ? ?ID:  Bryan Catalina., DOB [birth year], MRN [id number] ? ?PCP:  Bryan Munroe, NP  ?Cardiologist:   Bryan Bears, MD  ? ?Chief Complaint  ?Patient presents with  ? Other  ?  6 month follow up -- Patient c.o swelling in both ankles. Meds reviewed verbally with patient.   ? ? ?  ?History of Present Illness: ?Bryan Iha. is a 66 y.o. male who presents for follow-up visit regarding coronary artery disease. ?He had non-ST elevation myocardial infarction in June 2018.  He underwent PCI and drug-eluting stent placement to the LAD without complications.  Ejection fraction was normal.  Other medical problems include hypertension, polysubstance abuse including tobacco, alcohol and marijuana and remote history of cocaine use. ?He did not tolerate high-dose atorvastatin due to myalgia.  ? ?He had an echocardiogram done in September which showed normal LV systolic function, normal diastolic function and no significant valvular abnormalities. ?He stopped taking rosuvastatin due to dizziness.  He had no significant myalgia with it.  He denies chest pain.  He has chronic exertional dyspnea related to lung disease and continued smoking.  He does complain of worsening bilateral leg edema.  He denies excessive alcohol use at the present time and reports that he cut down significantly.  He used to drink a 12 pack beer daily. ? ? ? ?Past Medical History:  ?Diagnosis Date  ? Alcohol abuse   ? a. up to a 12 pack of beer/day.  ? Arthritis   ? CAD in native artery   ? a. LHC 6/18: severe 1 vessel CAD with 90% thrombotic stenosis in the proximal LAD which was felt to be the culprit for the patient's non-STEMI. He underwent successful PCI/DES with a resolute onyx drug-eluting stent (3.0 x 18 mm). There was 0% residual stenosis. LVEF estimated at 55-65% by visual estimate. LVEDP was mildly elevated  ? H/O echocardiogram   ? a. echo 6/18: EF 60-65%, normal wall motion, grade 1  diastolic dysfunction, RV cavity size was normal with normal wall thickness and normal RV systolic function. There were no significant valvular abnormalities  ? Hearing loss   ? Heart attack (HCC)   ? Hypertension   ? Marijuana abuse   ? a. occasional.  ? Tobacco abuse   ? ? ?Past Surgical History:  ?Procedure Laterality Date  ? APPENDECTOMY    ? CORONARY STENT INTERVENTION N/A 01/01/2017  ? Procedure: Coronary Stent Intervention;  Surgeon: Bryan Ouch, MD;  Location: [facility] INVASIVE CV LAB;  Service: Cardiovascular;  Laterality: N/A;  ? KNEE SURGERY    ? LEFT HEART CATH AND CORONARY ANGIOGRAPHY N/A 01/01/2017  ? Procedure: Left Heart Cath and Coronary Angiography;  Surgeon: Bryan Ouch, MD;  Location: [facility] INVASIVE CV LAB;  Service: Cardiovascular;  Laterality: N/A;  ? NASAL ENDOSCOPY N/A 08/02/2019  ? Procedure: NASAL ENDOSCOPY;  Surgeon: Bryan Gammon, MD;  Location: [facility] ORS;  Service: ENT;  Laterality: N/A;  ? NASAL HEMORRHAGE CONTROL N/A 08/02/2019  ? Procedure: EPISTAXIS CONTROL;  Surgeon: Bryan Gammon, MD;  Location: [facility] ORS;  Service: ENT;  Laterality: N/A;  ? shunt heart attack    ? ? ? ?Current Outpatient Medications  ?Medication Sig Dispense Refill  ? albuterol (VENTOLIN HFA) 108 (90 Base) MCG/ACT inhaler INHALE 1 TO 2 PUFFS INTO THE LUNGS EVERY 6 HOURS AS NEEDED FOR WHEEZING OR SHORTNESS OF BREATH 8.5 g 5  ?  amLODipine (NORVASC) 5 MG tablet Take 1 tablet (5 mg total) by mouth daily. 90 tablet 0  ? aspirin EC 81 MG tablet Take 1 tablet (81 mg total) by mouth daily. Hold for 3 days. 30 tablet 0  ? carvedilol (COREG) 6.25 MG tablet TAKE 1 TABLET(6.25 MG) BY MOUTH TWICE DAILY WITH A MEAL 180 tablet 2  ? fluticasone-salmeterol (ADVAIR DISKUS) 100-50 MCG/ACT AEPB Inhale 1 puff into the lungs 2 (two) times daily. 60 each 5  ? lisinopril (ZESTRIL) 40 MG tablet TAKE 1 TABLET(40 MG) BY MOUTH DAILY 90 tablet 0  ? Melatonin 10 MG TABS Take 2 tablets by mouth daily.    ? montelukast (SINGULAIR) 10 MG tablet  TAKE 1 TABLET(10 MG) BY MOUTH AT BEDTIME 90 tablet 1  ? sildenafil (REVATIO) 20 MG tablet Take 1-5 pills about 30 min prior to sex. Start with 1 and increase as needed. 90 tablet 3  ? rosuvastatin (CRESTOR) 10 MG tablet TAKE 1 TABLET(10 MG) BY MOUTH DAILY (Patient not taking: Reported on 10/04/2021) 90 tablet 1  ? ?No current facility-administered medications for this visit.  ? ? ?Allergies:   Patient has no known allergies.  ? ? ?Social History:  The patient  reports that he has been smoking cigarettes. He has a 10.50 pack-year smoking history. He has never used smokeless tobacco. He reports current alcohol use. He reports that he does not currently use drugs after having used the following drugs: Marijuana.  ? ?Family History:  The patient's family history includes CAD in his father and paternal grandfather; Colon polyps in his brother; Diabetes in his father.  ? ? ?ROS:  Please see the history of present illness.   Otherwise, review of systems are positive for none.   All other systems are reviewed and negative.  ? ? ?PHYSICAL EXAM: ?VS:  BP 112/80 (BP Location: Left Arm, Patient Position: Sitting, Cuff Size: Normal)   Pulse 73   Ht 6\' 2"  (1.88 m)   Wt 255 lb (115.7 kg)   SpO2 96%   BMI 32.74 kg/m?  , BMI Body mass index is 32.74 kg/m?. ?GEN: Well nourished, well developed, in no acute distress  ?HEENT: normal  ?Neck: no JVD, carotid bruits, or masses ?Cardiac: RRR; no murmurs, rubs, or gallops, mild bilateral leg edema ?Respiratory:  clear to auscultation bilaterally, normal work of breathing ?GI: soft, nontender, nondistended, + BS ?MS: no deformity or atrophy  ?Skin: warm and dry, no rash ?Neuro:  Strength and sensation are intact ?Psych: euthymic mood, full affect ? ? ?EKG:  EKG is ordered today. ?EKG showed normal sinus rhythm with left axis deviation ? ? ?Recent Labs: ?No results found for requested labs within last 8760 hours.  ? ? ?Lipid Panel ?   ?Component Value Date/Time  ? CHOL 153 01/30/2020 1115   ? TRIG 142 01/30/2020 1115  ? HDL 76 01/30/2020 1115  ? CHOLHDL 2.0 01/30/2020 1115  ? VLDL 12 06/24/2018 1725  ? LDLCALC 54 01/30/2020 1115  ? LDLDIRECT <4 06/24/2018 1725  ? ?  ? ?Wt Readings from Last 3 Encounters:  ?10/04/21 255 lb (115.7 kg)  ?06/07/21 250 lb 6.4 oz (113.6 kg)  ?03/29/21 261 lb (118.4 kg)  ?  ? ?No flowsheet data found. ? ? ? ?ASSESSMENT AND PLAN: ? ?1.  Coronary artery disease involving native coronary arteries without angina: He is doing reasonably well overall with no anginal symptoms.  Continue aspirin .  ? ?2.  Essential hypertension: Blood pressure is controlled.  He reports worsening bilateral leg edema.  We could consider switching amlodipine to a thiazide diuretic but he has not had any labs done in the last 3 years.  I requested routine labs today including CBC and CMP. ?  ?3.  Hyperlipidemia: He stopped taking rosuvastatin due to dizziness.  I explained to him that dizziness is unlikely related to rosuvastatin and he agreed to resume the medication which was refilled today. ?  ?4.  Tobacco use: He cut down but has not been able to quit completely.  I discussed with him the importance of smoking cessation.  ? ?5.  Alcohol use: He reports that he cut down significantly and denies excessive use at the present time. ? ? ? ? ?Disposition:   FU with me in 6 months ? ?Signed, ? ?Bryan Bears, MD  ?10/04/2021 2:45 PM    ?[facility] ?

## 2021-10-04 NOTE — Patient Instructions (Signed)
Medication Instructions:  ?Your physician has recommended you make the following change in your medication:  ? ?RESUME Crestor (rosuvastatin) 10 mg daily. An Rx has been sent to your pharmacy. ? ?*If you need a refill on your cardiac medications before your next appointment, please call your pharmacy* ? ? ?Lab Work: ?Cmp and Cbc today ? ?If you have labs (blood work) drawn today and your tests are completely normal, you will receive your results only by: ?MyChart Message (if you have MyChart) OR ?A paper copy in the mail ?If you have any lab test that is abnormal or we need to change your treatment, we will call you to review the results. ? ? ?Testing/Procedures: ?None ordered ? ? ?Follow-Up: ?At [facility], you and your health needs are our priority.  As part of our continuing mission to provide you with exceptional heart care, we have created designated Provider Care Teams.  These Care Teams include your primary Cardiologist (physician) and Advanced Practice Providers (APPs -  Physician Assistants and Nurse Practitioners) who all work together to provide you with the care you need, when you need it. ? ?We recommend signing up for the patient portal called "MyChart".  Sign up information is provided on this After Visit Summary.  MyChart is used to connect with patients for Virtual Visits (Telemedicine).  Patients are able to view lab/test results, encounter notes, upcoming appointments, etc.  Non-urgent messages can be sent to your provider as well.   ?To learn more about what you can do with MyChart, go to [email or web address].   ? ?Your next appointment:   ?6 month(s) ? ?The format for your next appointment:   ?In Person ? ?Provider:   ?You may see Lorine Bears, MD or one of the following Advanced Practice Providers on your designated Care Team:   ?Nicolasa Ducking, NP ?Eula Listen, PA-C ?Cadence Fransico Michael, PA-C ? ? ?Other Instructions ?N/A ? ?

## 2021-10-05 LAB — CBC WITH DIFFERENTIAL/PLATELET
Basophils Absolute: 0.1 10*3/uL (ref 0.0–0.2)
Basos: 1 %
EOS (ABSOLUTE): 0.1 10*3/uL (ref 0.0–0.4)
Eos: 1 %
Hematocrit: 49.2 % (ref 37.5–51.0)
Hemoglobin: 16.7 g/dL (ref 13.0–17.7)
Immature Grans (Abs): 0 10*3/uL (ref 0.0–0.1)
Immature Granulocytes: 0 %
Lymphocytes Absolute: 2 10*3/uL (ref 0.7–3.1)
Lymphs: 24 %
MCH: 31.2 pg (ref 26.6–33.0)
MCHC: 33.9 g/dL (ref 31.5–35.7)
MCV: 92 fL (ref 79–97)
Monocytes Absolute: 0.8 10*3/uL (ref 0.1–0.9)
Monocytes: 9 %
Neutrophils Absolute: 5.3 10*3/uL (ref 1.4–7.0)
Neutrophils: 65 %
Platelets: 272 10*3/uL (ref 150–450)
RBC: 5.36 x10E6/uL (ref 4.14–5.80)
RDW: 13.1 % (ref 11.6–15.4)
WBC: 8.2 10*3/uL (ref 3.4–10.8)

## 2021-10-05 LAB — COMPREHENSIVE METABOLIC PANEL
ALT: 14 IU/L (ref 0–44)
AST: 15 IU/L (ref 0–40)
Albumin/Globulin Ratio: 2.1 (ref 1.2–2.2)
Albumin: 4.8 g/dL (ref 3.8–4.8)
Alkaline Phosphatase: 101 IU/L (ref 44–121)
BUN/Creatinine Ratio: 14 (ref 10–24)
BUN: 14 mg/dL (ref 8–27)
Bilirubin Total: 0.6 mg/dL (ref 0.0–1.2)
CO2: 20 mmol/L (ref 20–29)
Calcium: 9.6 mg/dL (ref 8.6–10.2)
Chloride: 104 mmol/L (ref 96–106)
Creatinine, Ser: 1.02 mg/dL (ref 0.76–1.27)
Globulin, Total: 2.3 g/dL (ref 1.5–4.5)
Glucose: 78 mg/dL (ref 70–99)
Potassium: 4.3 mmol/L (ref 3.5–5.2)
Sodium: 140 mmol/L (ref 134–144)
Total Protein: 7.1 g/dL (ref 6.0–8.5)
eGFR: 82 mL/min/{1.73_m2} (ref >59)

## 2021-10-21 ENCOUNTER — Other Ambulatory Visit: Payer: Self-pay | Admitting: Family Medicine

## 2021-10-21 DIAGNOSIS — J4530 Mild persistent asthma, uncomplicated: Secondary | ICD-10-CM

## 2021-10-24 NOTE — Telephone Encounter (Signed)
Requested Prescriptions  ?Pending Prescriptions Disp Refills  ?? ADVAIR DISKUS 100-50 MCG/ACT AEPB [Pharmacy Med Name: ADVAIR 100-50 DISKUS] 60 each 1  ?  Sig: INHALE 1 PUFF INTO THE LUNGS TWICE A DAY  ?  ? Pulmonology:  Combination Products Passed - 10/21/2021  4:53 AM  ?  ?  Passed - Valid encounter within last 12 months  ?  Recent Outpatient Visits   ?      ? 4 months ago Flu-like symptoms  ? [facility] [place], Salvadore Oxford, NP  ? 4 months ago Erroneous encounter - disregard  ? [facility] [place], [place] W, NP  ? 4 months ago Flu-like symptoms  ? [facility] [place], [place] W, NP  ? 7 months ago Mild persistent asthma without complication  ? [facility] [place], Netta Neat, DO  ? 11 months ago Acute bronchitis, unspecified organism  ? [facility] Smitty Cords, DO  ?  ?  ?Future Appointments   ?        ? In 5 months Iran Ouch, MD [facility]  ?  ? ?  ?  ?  ? ?

## 2021-10-27 ENCOUNTER — Emergency Department: Payer: Medicare Other

## 2021-10-27 ENCOUNTER — Emergency Department
Admission: EM | Admit: 2021-10-27 | Discharge: 2021-10-27 | Disposition: A | Discharge status: Home / Self Care | Payer: Medicare Other | Attending: Emergency Medicine | Admitting: Emergency Medicine

## 2021-10-27 ENCOUNTER — Other Ambulatory Visit: Payer: Self-pay

## 2021-10-27 ENCOUNTER — Ambulatory Visit: Payer: Self-pay | Admitting: *Deleted

## 2021-10-27 DIAGNOSIS — R1032 Left lower quadrant pain: Secondary | ICD-10-CM | POA: Diagnosis present

## 2021-10-27 DIAGNOSIS — I10 Essential (primary) hypertension: Secondary | ICD-10-CM | POA: Diagnosis not present

## 2021-10-27 DIAGNOSIS — K5792 Diverticulitis of intestine, part unspecified, without perforation or abscess without bleeding: Secondary | ICD-10-CM | POA: Insufficient documentation

## 2021-10-27 LAB — URINALYSIS, ROUTINE W REFLEX MICROSCOPIC
Bilirubin Urine: NEGATIVE
Glucose, UA: NEGATIVE mg/dL
Hgb urine dipstick: NEGATIVE
Ketones, ur: NEGATIVE mg/dL
Leukocytes,Ua: NEGATIVE
Nitrite: NEGATIVE
Protein, ur: NEGATIVE mg/dL
Specific Gravity, Urine: 1.018 (ref 1.005–1.030)
pH: 5 (ref 5.0–8.0)

## 2021-10-27 LAB — CBC
HCT: 48.2 % (ref 39.0–52.0)
Hemoglobin: 16.4 g/dL (ref 13.0–17.0)
MCH: 30.7 pg (ref 26.0–34.0)
MCHC: 34 g/dL (ref 30.0–36.0)
MCV: 90.1 fL (ref 80.0–100.0)
Platelets: 238 10*3/uL (ref 150–400)
RBC: 5.35 MIL/uL (ref 4.22–5.81)
RDW: 12.2 % (ref 11.5–15.5)
WBC: 10.2 10*3/uL (ref 4.0–10.5)
nRBC: 0 % (ref 0.0–0.2)

## 2021-10-27 LAB — COMPREHENSIVE METABOLIC PANEL
ALT: 17 U/L (ref 0–44)
AST: 17 U/L (ref 15–41)
Albumin: 4.1 g/dL (ref 3.5–5.0)
Alkaline Phosphatase: 88 U/L (ref 38–126)
Anion gap: 9 (ref 5–15)
BUN: 16 mg/dL (ref 8–23)
CO2: 26 mmol/L (ref 22–32)
Calcium: 9.4 mg/dL (ref 8.9–10.3)
Chloride: 100 mmol/L (ref 98–111)
Creatinine, Ser: 0.91 mg/dL (ref 0.61–1.24)
GFR, Estimated: >60 mL/min (ref >60)
Glucose, Bld: 104 mg/dL — ABNORMAL HIGH (ref 70–99)
Potassium: 4.5 mmol/L (ref 3.5–5.1)
Sodium: 135 mmol/L (ref 135–145)
Total Bilirubin: 1.6 mg/dL — ABNORMAL HIGH (ref 0.3–1.2)
Total Protein: 7.8 g/dL (ref 6.5–8.1)

## 2021-10-27 LAB — LIPASE, BLOOD: Lipase: 37 U/L (ref 11–51)

## 2021-10-27 MED ORDER — ONDANSETRON HCL 4 MG/2ML IJ SOLN
4.0000 mg | Freq: Once | INTRAMUSCULAR | Status: AC
  Administered 2021-10-27: 4 mg via INTRAVENOUS
  Filled 2021-10-27: qty 2

## 2021-10-27 MED ORDER — OXYCODONE-ACETAMINOPHEN 5-325 MG PO TABS: 1.0000 | ORAL_TABLET | Freq: Three times a day (TID) | ORAL | 0 refills | Status: DC | PRN

## 2021-10-27 MED ORDER — MORPHINE SULFATE (PF) 4 MG/ML IV SOLN
4.0000 mg | Freq: Once | INTRAVENOUS | Status: AC
  Administered 2021-10-27: 4 mg via INTRAVENOUS
  Filled 2021-10-27: qty 1

## 2021-10-27 MED ORDER — SODIUM CHLORIDE 0.9 % IV BOLUS
1000.0000 mL | Freq: Once | INTRAVENOUS | Status: AC
  Administered 2021-10-27: 1000 mL via INTRAVENOUS

## 2021-10-27 MED ORDER — AMOXICILLIN-POT CLAVULANATE 875-125 MG PO TABS: 1.0000 | ORAL_TABLET | Freq: Three times a day (TID) | ORAL | 0 refills | Status: AC

## 2021-10-27 NOTE — Telephone Encounter (Signed)
Will reviewed ED note ?

## 2021-10-27 NOTE — Telephone Encounter (Signed)
FYI

## 2021-10-27 NOTE — Discharge Instructions (Addendum)
Please seek medical attention for any high fevers, chest pain, shortness of breath, change in behavior, persistent vomiting, bloody stool or any other new or concerning symptoms.  

## 2021-10-27 NOTE — Telephone Encounter (Signed)
?  Chief Complaint: sharp abd pain since 2:00 AM this morning ?Symptoms: sharp abd pain in right lower abd into groin area ?Frequency: Now ?Pertinent Negatives: Patient denies vomiting, blood in urine or problems with BM ?Disposition: [x] ED /[] Urgent Care (no appt availability in office) / [] Appointment(In office/virtual)/ []  [place] [facility]/ [] Home Care/ [] Refused Recommended Disposition /[] [place] [facility]/ []  Follow-up with PCP ?Additional Notes: Going to [facility]  ?

## 2021-10-27 NOTE — ED Provider Notes (Signed)
? ?[facility] ?Provider Note ? ? ? Event Date/Time  ? First MD Initiated Contact with Patient 10/27/21 1232   ?  (approximate) ? ? ?History  ? ?Abdominal Pain ? ? ?HPI ? ?Bryan Ibarra. is a 66 y.o. male who, per cardiology note dated 10/04/21 has history of NSTEMI, HTN, who presents to the emergency department today because of concern for left lower quadrant pain. Pain started around 2-3 am this morning. It has been severe, it does wax and wane in intensity. The patient has had some associated nausea. Feels like his urine has been darker. The patient denies any fevers. Denies any change with defecation. Denies similar pain in the past.   ? ? ?Physical Exam  ? ?Triage Vital Signs: ?ED Triage Vitals  ?Enc Vitals Group  ?   BP 10/27/21 1223 (!) 127/99  ?   Pulse Rate 10/27/21 1223 75  ?   Resp 10/27/21 1223 18  ?   Temp 10/27/21 1223 98.5 ?F (36.9 ?C)  ?   Temp Source 10/27/21 1223 Oral  ?   SpO2 10/27/21 1223 95 %  ?   Weight --   ?   Height --   ?   Head Circumference --   ?   Peak Flow --   ?   Pain Score 10/27/21 1350 4  ?   Pain Loc --   ?   Pain Edu? --   ?   Excl. in [place]? --   ? ? ?Most recent vital signs: ?Vitals:  ? 10/27/21 1400 10/27/21 1506  ?BP: 116/77 127/86  ?Pulse: 67 65  ?Resp: 18 18  ?Temp:    ?SpO2: 95% 94%  ? ?General: Awake, no distress.  ?CV:  Good peripheral perfusion.  ?Resp:  Normal effort.  ?Abd:  No distention. Tender to palpation in the left lower quadrant.  ? ?ED Results / Procedures / Treatments  ? ?Labs ?(all labs ordered are listed, but only abnormal results are displayed) ?Labs Reviewed  ?COMPREHENSIVE METABOLIC PANEL - Abnormal; Notable for the following components:  ?    Result Value  ? Glucose, Bld 104 (*)   ? Total Bilirubin 1.6 (*)   ? All other components within normal limits  ?URINALYSIS, ROUTINE W REFLEX MICROSCOPIC - Abnormal; Notable for the following components:  ? Color, Urine YELLOW (*)   ? APPearance CLEAR (*)   ? All other components within  normal limits  ?LIPASE, BLOOD  ?CBC  ? ? ? ?EKG ? ?None ? ? ?RADIOLOGY ?I independently interpreted and visualized the ct renal. My interpretation: No kidney stone. No free air. ?Radiology interpretation:  ?IMPRESSION:  ?1. Focal acute diverticulitis at the junction of the descending and  ?sigmoid colon. No associated abscess visualized.  ?2. Suspected cholelithiasis.  ?3. Mild prostatomegaly.  ?   ? ? ?PROCEDURES: ? ?Critical Care performed: No ? ?Procedures ? ? ?MEDICATIONS ORDERED IN ED: ?Medications  ?morphine (PF) 4 MG/ML injection 4 mg (has no administration in time range)  ?sodium chloride 0.9 % bolus 1,000 mL (1,000 mLs Intravenous New Bag/Given 10/27/21 1317)  ?morphine (PF) 4 MG/ML injection 4 mg (4 mg Intravenous Given 10/27/21 1318)  ?ondansetron (ZOFRAN) injection 4 mg (4 mg Intravenous Given 10/27/21 1318)  ? ? ? ?IMPRESSION / MDM / ASSESSMENT AND PLAN / ED COURSE  ?I reviewed the triage vital signs and the nursing notes. ?             ?               ? ?  Differential diagnosis includes, but is not limited to, kidney stone, diverticulitis. ? ?Patient presented to the ER because of concern for LLQ pain. On exam patient is tender to palpation in that area. The patient blood work without leukocytosis. No elevated kidney function. Given concern for possible kidney stone vs diverticulitis ct exam was obtained. This was consistent with diverticulitis. Discussed finding with patient. Will plan on discharging with prescription for antibiotics and pain medication. Encouraged follow up with PCP. ? ? ? ?FINAL CLINICAL IMPRESSION(S) / ED DIAGNOSES  ? ?Final diagnoses:  ?Diverticulitis  ? ? ? ?Rx / DC Orders  ? ?ED Discharge Orders   ? ?      Ordered  ?  oxyCODONE-acetaminophen (PERCOCET) 5-325 MG tablet  Every 8 hours PRN       ? 10/27/21 1514  ?  amoxicillin-clavulanate (AUGMENTIN) 875-125 MG tablet  3 times daily       ? 10/27/21 1514  ? ?  ?  ? ?  ? ? ? ?Note:  This document was prepared using Dragon voice  recognition software and may include unintentional dictation errors. ? ?  ?Nance Pear, MD ?10/27/21 1539 ? ?

## 2021-10-27 NOTE — ED Triage Notes (Signed)
Pt c/o LLQ pain with nausea since 2am today, states the pain is worse with urination. Denies hx of kidney stones. Denies vomiting or diarrhea ?

## 2021-10-27 NOTE — Telephone Encounter (Signed)
Reason for Disposition ? [1] SEVERE pain (e.g., excruciating) AND [2] present > 1 hour ? ?Answer Assessment - Initial Assessment Questions ?1. LOCATION: "Where does it hurt?"  ?    Hurting bottom right abd and in groin area too.   Sharp pain.  I can't hardly stand up straight. ?2. RADIATION: "Does the pain shoot anywhere else?" (e.g., chest, back) ?    No ?3. ONSET: "When did the pain begin?" (Minutes, hours or days ago)  ?    My urine color is darker.  It hurts if I don't go to urinate.   ?Started early this morning while still in bed.  2:00 AM ?4. SUDDEN: "Gradual or sudden onset?" ?    It woke me up. ?5. PATTERN "Does the pain come and go, or is it constant?" ?   - If constant: "Is it getting better, staying the same, or worsening?"  ?    (Note: Constant means the pain never goes away completely; most serious pain is constant and it progresses)  ?   - If intermittent: "How long does it last?" "Do you have pain now?" ?    (Note: Intermittent means the pain goes away completely between bouts) ?    Constant ?6. SEVERITY: "How bad is the pain?"  (e.g., Scale 1-10; mild, moderate, or severe) ?   - MILD (1-3): doesn't interfere with normal activities, abdomen soft and not tender to touch  ?   - MODERATE (4-7): interferes with normal activities or awakens from sleep, abdomen tender to touch  ?   - SEVERE (8-10): excruciating pain, doubled over, unable to do any normal activities   ?    Severe ?7. RECURRENT SYMPTOM: "Have you ever had this type of stomach pain before?" If Yes, ask: "When was the last time?" and "What happened that time?"  ?    No ?No history of kidney stones or abd surgery ?8. CAUSE: "What do you think is causing the stomach pain?" ?    *No Answer* ?9. RELIEVING/AGGRAVATING FACTORS: "What makes it better or worse?" (e.g., movement, antacids, bowel movement) ?    *No Answer* ?10. OTHER SYMPTOMS: "Do you have any other symptoms?" (e.g., back pain, diarrhea, fever, urination pain, vomiting) ?      No  changes ? ?Protocols used: Abdominal Pain - Male-A-AH ? ?

## 2021-11-28 ENCOUNTER — Telehealth (INDEPENDENT_AMBULATORY_CARE_PROVIDER_SITE_OTHER): Payer: Medicare Other | Admitting: Internal Medicine

## 2021-11-28 ENCOUNTER — Ambulatory Visit: Payer: Self-pay | Admitting: *Deleted

## 2021-11-28 ENCOUNTER — Encounter: Payer: Self-pay | Admitting: Internal Medicine

## 2021-11-28 DIAGNOSIS — J00 Acute nasopharyngitis [common cold]: Secondary | ICD-10-CM | POA: Diagnosis not present

## 2021-11-28 MED ORDER — AZITHROMYCIN 250 MG PO TABS: ORAL_TABLET | ORAL | 0 refills | Status: DC

## 2021-11-28 MED ORDER — HYDROCOD POLI-CHLORPHE POLI ER 10-8 MG/5ML PO SUER: 5.0000 mL | Freq: Two times a day (BID) | ORAL | 0 refills | Status: DC | PRN

## 2021-11-28 NOTE — Telephone Encounter (Signed)
Summary: sinus discomfort / rx request  ? The patient is experiencing significant sinus discomfort  ? ?The patient shares that their symptoms began roughly 2 weeks ago and they've previously used OTC medication  ? ?The patient is experiencing coughing, congestion, watery eyes and sore throat  ? ?The patient would like to be prescribed cough medicine and a z pak  ? ?Please contact further when possible   ?  ? ?Reason for Disposition ? Lots of coughing ? ?Answer Assessment - Initial Assessment Questions ?1. LOCATION: "Where does it hurt?"  ?    Little sinus pain- cough and chest sore, headache ?2. ONSET: "When did the sinus pain start?"  (e.g., hours, days)  ?    2 weeks ago- using OTC- not better ?3. SEVERITY: "How bad is the pain?"   (Scale 1-10; mild, moderate or severe) ?  - MILD (1-3): doesn't interfere with normal activities  ?  - MODERATE (4-7): interferes with normal activities (e.g., work or school) or awakens from sleep ?  - SEVERE (8-10): excruciating pain and patient unable to do any normal activities    ?    moderate ?4. RECURRENT SYMPTOM: "Have you ever had sinus problems before?" If Yes, ask: "When was the last time?" and "What happened that time?"  ?    Yes- history sinus infection ?5. NASAL CONGESTION: "Is the nose blocked?" If Yes, ask: "Can you open it or must you breathe through your mouth?" ?    Sinus drainage ?6. NASAL DISCHARGE: "Do you have discharge from your nose?" If so ask, "What color?" ?    Greenish/brown ?7. FEVER: "Do you have a fever?" If Yes, ask: "What is it, how was it measured, and when did it start?"  ?    no ?8. OTHER SYMPTOMS: "Do you have any other symptoms?" (e.g., sore throat, cough, earache, difficulty breathing) ?    Cough, sore throat, difficultly breathing- uses inhaler ?9. PREGNANCY: "Is there any chance you are pregnant?" "When was your last menstrual period?" ? ?Protocols used: Sinus Pain or Congestion-A-AH ? ?

## 2021-11-28 NOTE — Progress Notes (Signed)
Virtual Visit via Video Note ? ?I connected with Bryan Ibarra. on 11/28/21 at  4:00 PM EDT by a video enabled telemedicine application and verified that I am speaking with the correct person using two identifiers. ? ?Location: ?Patient: home ?Provider: Office ? ?Person's participating in this video call: Nicki Reaper, NP-C and Vito Berger. ?  ?I discussed the limitations of evaluation and management by telemedicine and the availability of in person appointments. The patient expressed understanding and agreed to proceed. ? ?History of Present Illness: ? ?Pt reports headache, cough, chest congestion and shortness of breath. This started 1 week ago. The headache is generalized. He denies dizziness, vision changes. The cough is productive of brown mucous. He is slightly more short of breath than usual. He denies runny nose, nasal congestion, ear pain, sore throat, nausea, vomiting or diarrhea. He denies chills or body aches but has had some chills. He has not taken anything OTC for this. He is taking Singulair, Albuterol and Advair as prescribed. He has not had sick contacts with similar symptoms.  ? ? ?Past Medical History:  ?Diagnosis Date  ? Alcohol abuse   ? a. up to a 12 pack of beer/day.  ? Arthritis   ? CAD in native artery   ? a. LHC 6/18: severe 1 vessel CAD with 90% thrombotic stenosis in the proximal LAD which was felt to be the culprit for the patient's non-STEMI. He underwent successful PCI/DES with a resolute onyx drug-eluting stent (3.0 x 18 mm). There was 0% residual stenosis. LVEF estimated at 55-65% by visual estimate. LVEDP was mildly elevated  ? H/O echocardiogram   ? a. echo 6/18: EF 60-65%, normal wall motion, grade 1 diastolic dysfunction, RV cavity size was normal with normal wall thickness and normal RV systolic function. There were no significant valvular abnormalities  ? Hearing loss   ? Heart attack (HCC)   ? Hypertension   ? Marijuana abuse   ? a. occasional.  ? Tobacco abuse    ? ? ?Current Outpatient Medications  ?Medication Sig Dispense Refill  ? ADVAIR DISKUS 100-50 MCG/ACT AEPB INHALE 1 PUFF INTO THE LUNGS TWICE A DAY 60 each 1  ? albuterol (VENTOLIN HFA) 108 (90 Base) MCG/ACT inhaler INHALE 1 TO 2 PUFFS INTO THE LUNGS EVERY 6 HOURS AS NEEDED FOR WHEEZING OR SHORTNESS OF BREATH 8.5 g 5  ? amLODipine (NORVASC) 5 MG tablet Take 1 tablet (5 mg total) by mouth daily. 90 tablet 2  ? aspirin EC 81 MG tablet Take 1 tablet (81 mg total) by mouth daily. Hold for 3 days. 30 tablet 0  ? carvedilol (COREG) 6.25 MG tablet TAKE 1 TABLET(6.25 MG) BY MOUTH TWICE DAILY WITH A MEAL 180 tablet 2  ? lisinopril (ZESTRIL) 40 MG tablet TAKE 1 TABLET(40 MG) BY MOUTH DAILY 90 tablet 2  ? Melatonin 10 MG TABS Take 2 tablets by mouth daily.    ? montelukast (SINGULAIR) 10 MG tablet TAKE 1 TABLET(10 MG) BY MOUTH AT BEDTIME 90 tablet 1  ? oxyCODONE-acetaminophen (PERCOCET) 5-325 MG tablet Take 1 tablet by mouth every 8 (eight) hours as needed for severe pain. 15 tablet 0  ? rosuvastatin (CRESTOR) 10 MG tablet TAKE 1 TABLET(10 MG) BY MOUTH DAILY 90 tablet 2  ? sildenafil (REVATIO) 20 MG tablet Take 1-5 pills about 30 min prior to sex. Start with 1 and increase as needed. 90 tablet 3  ? ?No current facility-administered medications for this visit.  ? ? ?No Known Allergies ? ?  Family History  ?Problem Relation Age of Onset  ? CAD Father   ?     s/p CABG in his 4340's or 1750's.  ? Diabetes Father   ? CAD Paternal Grandfather   ? Colon polyps Brother   ? ? ?Social History  ? ?Socioeconomic History  ? Marital status: Divorced  ?  Spouse name: Not on file  ? Number of children: Not on file  ? Years of education: Not on file  ? Highest education level: High school graduate  ?Occupational History  ? Not on file  ?Tobacco Use  ? Smoking status: Some Days  ?  Packs/day: 0.25  ?  Years: 42.00  ?  Pack years: 10.50  ?  Types: Cigarettes  ? Smokeless tobacco: Never  ? Tobacco comments:  ?  smokes 5 cigarettes daily.   ?Vaping Use   ? Vaping Use: Never used  ?Substance and Sexual Activity  ? Alcohol use: Yes  ?  Comment: 12 beers per week  ? Drug use: Not Currently  ?  Types: Marijuana  ?  Comment: occasional MJ.  Remote cocaine.  ? Sexual activity: Yes  ?Other Topics Concern  ? Not on file  ?Social History Narrative  ? Lives in [place].  He lives in a mobile home in the backyard of his girlfriend.  He works in Aeronautical engineerlandscaping.  ? ?Social Determinants of Health  ? ?Financial Resource Strain: Not on file  ?Food Insecurity: Not on file  ?Transportation Needs: Not on file  ?Physical Activity: Not on file  ?Stress: Not on file  ?Social Connections: Not on file  ?Intimate Partner Violence: Not on file  ? ? ? ?Constitutional: Pt reports headache. Denies fever, malaise, fatigue, or abrupt weight changes.  ?HEENT: Denies eye pain, eye redness, ear pain, ringing in the ears, wax buildup, runny nose, nasal congestion, bloody nose, or sore throat. ?Respiratory: Pt reports cough, chest congestion and shortness of breath. Denies difficulty breathing,.   ?Cardiovascular: Denies chest pain, chest tightness, palpitations or swelling in the hands or feet.  ?Gastrointestinal: Denies abdominal pain, bloating, constipation, diarrhea or blood in the stool.  ? ?No other specific complaints in a complete review of systems (except as listed in HPI above). ? ?  ?Observations/Objective: ? ? ?Wt Readings from Last 3 Encounters:  ?10/04/21 255 lb (115.7 kg)  ?06/07/21 250 lb 6.4 oz (113.6 kg)  ?03/29/21 261 lb (118.4 kg)  ? ? ?General: Appears his stated age,  in NAD. ?HEENT:  Throat/Mouth: hoarseness noted. ?Pulmonary/Chest: Normal effort . No respiratory distress.  ?Neurological: Alert and oriented. C ? ?BMET ?   ?Component Value Date/Time  ? NA 135 10/27/2021 1228  ? NA 140 10/04/2021 1459  ? K 4.5 10/27/2021 1228  ? CL 100 10/27/2021 1228  ? CO2 26 10/27/2021 1228  ? GLUCOSE 104 (H) 10/27/2021 1228  ? BUN 16 10/27/2021 1228  ? BUN 14 10/04/2021 1459  ? CREATININE 0.91  10/27/2021 1228  ? CREATININE 1.13 01/30/2020 1115  ? CALCIUM 9.4 10/27/2021 1228  ? GFRNONAA >60 10/27/2021 1228  ? GFRNONAA 69 01/30/2020 1115  ? GFRAA 80 01/30/2020 1115  ? ? ?Lipid Panel  ?   ?Component Value Date/Time  ? CHOL 153 01/30/2020 1115  ? TRIG 142 01/30/2020 1115  ? HDL 76 01/30/2020 1115  ? CHOLHDL 2.0 01/30/2020 1115  ? VLDL 12 06/24/2018 1725  ? LDLCALC 54 01/30/2020 1115  ? ? ?CBC ?   ?Component Value Date/Time  ? WBC 10.2  10/27/2021 1228  ? RBC 5.35 10/27/2021 1228  ? HGB 16.4 10/27/2021 1228  ? HGB 16.7 10/04/2021 1459  ? HCT 48.2 10/27/2021 1228  ? HCT 49.2 10/04/2021 1459  ? PLT 238 10/27/2021 1228  ? PLT 272 10/04/2021 1459  ? MCV 90.1 10/27/2021 1228  ? MCV 92 10/04/2021 1459  ? MCH 30.7 10/27/2021 1228  ? MCHC 34.0 10/27/2021 1228  ? RDW 12.2 10/27/2021 1228  ? RDW 13.1 10/04/2021 1459  ? LYMPHSABS 2.0 10/04/2021 1459  ? MONOABS 0.5 08/02/2019 0703  ? EOSABS 0.1 10/04/2021 1459  ? BASOSABS 0.1 10/04/2021 1459  ? ? ?Hgb A1C ?Lab Results  ?Component Value Date  ? HGBA1C 5.3 01/01/2017  ? ? ? ? ? ?Assessment and Plan: ? ?URI: ? ?Encouraged rest and fluids ?RX for Azithromycin 250 mg PO x 5 days ?RX for Tussionex as needed for cough ?Continue medications as previously prescribed ? ?Schedule a follow up appt with me  ? ?Follow Up Instructions: ? ?  ?I discussed the assessment and treatment plan with the patient. The patient was provided an opportunity to ask questions and all were answered. The patient agreed with the plan and demonstrated an understanding of the instructions. ?  ?The patient was advised to call back or seek an in-person evaluation if the symptoms worsen or if the condition fails to improve as anticipated. ? ? ? ?Nicki Reaper, NP ? ?

## 2021-11-28 NOTE — Telephone Encounter (Signed)
?  Chief Complaint: nasal drainage, sore throat ?Symptoms: sore throat, cough, headache ?Frequency: 2 weeks ?Pertinent Negatives: Patient denies fever ?Disposition: [] ED /[] Urgent Care (no appt availability in office) / [x] Appointment(In office/virtual)/ []  [place] [facility]/ [] Home Care/ [] Refused Recommended Disposition /[] [place] [facility]/ []  Follow-up with PCP ?Additional Notes:    ?

## 2021-11-28 NOTE — Patient Instructions (Signed)

## 2021-12-18 ENCOUNTER — Other Ambulatory Visit: Payer: Self-pay | Admitting: Family Medicine

## 2021-12-18 DIAGNOSIS — J4530 Mild persistent asthma, uncomplicated: Secondary | ICD-10-CM

## 2021-12-20 NOTE — Telephone Encounter (Signed)
Requested Prescriptions  Pending Prescriptions Disp Refills  . albuterol (VENTOLIN HFA) 108 (90 Base) MCG/ACT inhaler [Pharmacy Med Name: ALBUTEROL HFA (PROAIR) INHALER] 8.5 each 3    Sig: INHALE 1-2 PUFFS INTO THE LUNGS EVERY 6 HOURS AS NEEDED FOR WHEEZING OR SHORTNESS OF BREATH     Pulmonology:  Beta Agonists 2 Passed - 12/18/2021  6:12 AM      Passed - Last BP in normal range    BP Readings from Last 1 Encounters:  10/27/21 110/84         Passed - Last Heart Rate in normal range    Pulse Readings from Last 1 Encounters:  10/27/21 69         Passed - Valid encounter within last 12 months    Recent Outpatient Visits          3 weeks ago Acute nasopharyngitis   [facility] [place], Coralie Keens, NP   6 months ago Flu-like symptoms   [facility] [place], Coralie Keens, NP   6 months ago Erroneous encounter - disregard   [facility] [place], Coralie Keens, NP   6 months ago Flu-like symptoms   [facility] [place], [place], NP   9 months ago Mild persistent asthma without complication   [place], DO      Future Appointments            In 3 months Fletcher Anon, Mertie Clause, MD [facility] [place], [place]

## 2021-12-21 ENCOUNTER — Other Ambulatory Visit: Payer: Self-pay | Admitting: Internal Medicine

## 2021-12-21 DIAGNOSIS — J4530 Mild persistent asthma, uncomplicated: Secondary | ICD-10-CM

## 2021-12-22 NOTE — Telephone Encounter (Signed)
Requested Prescriptions  Pending Prescriptions Disp Refills  . ADVAIR DISKUS 100-50 MCG/ACT AEPB [Pharmacy Med Name: ADVAIR 100-50 DISKUS] 60 each 1    Sig: INHALE 1 PUFF INTO THE LUNGS TWICE A DAY     Pulmonology:  Combination Products Passed - 12/21/2021  5:36 AM      Passed - Valid encounter within last 12 months    Recent Outpatient Visits          3 weeks ago Acute nasopharyngitis   [facility] [place], Salvadore Oxford, NP   6 months ago Flu-like symptoms   [facility] [place], Salvadore Oxford, NP   6 months ago Erroneous encounter - disregard   [facility] [place], Salvadore Oxford, NP   6 months ago Flu-like symptoms   [facility] [place], [place], NP   9 months ago Mild persistent asthma without complication   [facility] Smitty Cords, DO      Future Appointments            In 3 months Kirke Corin, Chelsea Aus, MD [facility]

## 2022-01-19 ENCOUNTER — Other Ambulatory Visit: Payer: Self-pay | Admitting: Family Medicine

## 2022-01-19 DIAGNOSIS — J4530 Mild persistent asthma, uncomplicated: Secondary | ICD-10-CM

## 2022-01-19 NOTE — Telephone Encounter (Signed)
Requested Prescriptions  Pending Prescriptions Disp Refills  . montelukast (SINGULAIR) 10 MG tablet [Pharmacy Med Name: MONTELUKAST SOD 10 MG TABLET] 90 tablet 1    Sig: TAKE 1 TABLET BY MOUTH AT BEDTIME     Pulmonology:  Leukotriene Inhibitors Passed - 01/19/2022 11:21 AM      Passed - Valid encounter within last 12 months    Recent Outpatient Visits          1 month ago Acute nasopharyngitis   [facility] [place], Salvadore Oxford, NP   7 months ago Flu-like symptoms   [facility] [place], Salvadore Oxford, NP   7 months ago Erroneous encounter - disregard   [facility] [place], Salvadore Oxford, NP   7 months ago Flu-like symptoms   [facility] [place], [place], NP   10 months ago Mild persistent asthma without complication   [facility] Smitty Cords, DO      Future Appointments            In 2 months Kirke Corin, Chelsea Aus, MD [facility]

## 2022-02-17 ENCOUNTER — Other Ambulatory Visit: Payer: Self-pay | Admitting: Internal Medicine

## 2022-02-17 DIAGNOSIS — J4530 Mild persistent asthma, uncomplicated: Secondary | ICD-10-CM

## 2022-02-17 NOTE — Telephone Encounter (Signed)
Requested Prescriptions  Pending Prescriptions Disp Refills  . fluticasone-salmeterol (ADVAIR DISKUS) 100-50 MCG/ACT AEPB [Pharmacy Med Name: ADVAIR 100-50 DISKUS] 60 each 1    Sig: INHALE 1 PUFF INTO THE LUNGS TWICE A DAY     Pulmonology:  Combination Products Passed - 02/17/2022  9:06 AM      Passed - Valid encounter within last 12 months    Recent Outpatient Visits          2 months ago Acute nasopharyngitis   [facility] [place], Salvadore Oxford, NP   8 months ago Flu-like symptoms   [facility] [place], Salvadore Oxford, NP   8 months ago Erroneous encounter - disregard   [facility] [place], Salvadore Oxford, NP   8 months ago Flu-like symptoms   [facility] [place], [place], NP   11 months ago Mild persistent asthma without complication   [facility] Smitty Cords, DO      Future Appointments            In 1 month Kirke Corin, Chelsea Aus, MD [facility]

## 2022-03-18 ENCOUNTER — Other Ambulatory Visit: Payer: Self-pay | Admitting: Family Medicine

## 2022-03-18 DIAGNOSIS — J4530 Mild persistent asthma, uncomplicated: Secondary | ICD-10-CM

## 2022-03-20 NOTE — Telephone Encounter (Signed)
Requested Prescriptions  Pending Prescriptions Disp Refills  . albuterol (VENTOLIN HFA) 108 (90 Base) MCG/ACT inhaler [Pharmacy Med Name: ALBUTEROL HFA (PROAIR) INHALER] 8.5 each 0    Sig: INHALE 1-2 PUFFS INTO THE LUNGS EVERY 6 HOURS AS NEEDED FOR WHEEZING OR SHORTNESS OF BREATH.     Pulmonology:  Beta Agonists 2 Passed - 03/18/2022  5:45 AM      Passed - Last BP in normal range    BP Readings from Last 1 Encounters:  10/27/21 110/84         Passed - Last Heart Rate in normal range    Pulse Readings from Last 1 Encounters:  10/27/21 69         Passed - Valid encounter within last 12 months    Recent Outpatient Visits          3 months ago Acute nasopharyngitis   [facility] [place], Salvadore Oxford, NP   9 months ago Flu-like symptoms   [facility] [place], Salvadore Oxford, NP   9 months ago Erroneous encounter - disregard   [facility] [place], Salvadore Oxford, NP   9 months ago Flu-like symptoms   [facility] [place], Salvadore Oxford, NP   1 year ago Mild persistent asthma without complication   [facility] Smitty Cords, DO      Future Appointments            In 3 weeks Kirke Corin, Chelsea Aus, MD [facility] [place], [facility]

## 2022-04-11 ENCOUNTER — Ambulatory Visit: Payer: Medicare Other | Admitting: Cardiovascular Disease

## 2022-04-11 ENCOUNTER — Other Ambulatory Visit: Payer: Self-pay | Admitting: Internal Medicine

## 2022-04-11 DIAGNOSIS — J4530 Mild persistent asthma, uncomplicated: Secondary | ICD-10-CM

## 2022-04-12 NOTE — Telephone Encounter (Signed)
Requested Prescriptions  Pending Prescriptions Disp Refills  . albuterol (VENTOLIN HFA) 108 (90 Base) MCG/ACT inhaler [Pharmacy Med Name: ALBUTEROL HFA (PROAIR) INHALER] 8.5 each 0    Sig: INHALE 1-2 PUFFS INTO THE LUNGS EVERY 6 HOURS AS NEEDED FOR WHEEZING OR SHORTNESS OF BREATH.     Pulmonology:  Beta Agonists 2 Passed - 04/11/2022  8:44 AM      Passed - Last BP in normal range    BP Readings from Last 1 Encounters:  10/27/21 110/84         Passed - Last Heart Rate in normal range    Pulse Readings from Last 1 Encounters:  10/27/21 69         Passed - Valid encounter within last 12 months    Recent Outpatient Visits          4 months ago Acute nasopharyngitis   [facility] [place], Salvadore Oxford, NP   10 months ago Flu-like symptoms   [facility] [place], Salvadore Oxford, NP   10 months ago Erroneous encounter - disregard   [facility] [place], Salvadore Oxford, NP   10 months ago Flu-like symptoms   [facility] [place], Salvadore Oxford, NP   1 year ago Mild persistent asthma without complication   [facility], Netta Neat, DO             . ADVAIR DISKUS 100-50 MCG/ACT AEPB [Pharmacy Med Name: ADVAIR 100-50 DISKUS] 60 each 1    Sig: INHALE 1 PUFF INTO THE LUNGS TWICE A DAY     Pulmonology:  Combination Products Passed - 04/11/2022  8:44 AM      Passed - Valid encounter within last 12 months    Recent Outpatient Visits          4 months ago Acute nasopharyngitis   [facility] [place], Salvadore Oxford, NP   10 months ago Flu-like symptoms   [facility], Salvadore Oxford, NP   10 months ago Erroneous encounter - disregard   [facility] [place], Salvadore Oxford, NP   10 months ago Flu-like symptoms   [facility] [place], Salvadore Oxford, NP   1 year ago Mild persistent asthma without complication   [facility] [place], Netta Neat, DO

## 2022-05-04 ENCOUNTER — Other Ambulatory Visit: Payer: Self-pay | Admitting: Internal Medicine

## 2022-05-04 DIAGNOSIS — J4530 Mild persistent asthma, uncomplicated: Secondary | ICD-10-CM

## 2022-05-04 NOTE — Telephone Encounter (Signed)
Requested Prescriptions  Pending Prescriptions Disp Refills  . albuterol (VENTOLIN HFA) 108 (90 Base) MCG/ACT inhaler [Pharmacy Med Name: ALBUTEROL HFA (PROAIR) INHALER] 8.5 each 0    Sig: INHALE 1-2 PUFFS INTO THE LUNGS EVERY 6 HOURS AS NEEDED FOR WHEEZING OR SHORTNESS OF BREATH.     Pulmonology:  Beta Agonists 2 Passed - 05/04/2022  2:35 AM      Passed - Last BP in normal range    BP Readings from Last 1 Encounters:  10/27/21 110/84         Passed - Last Heart Rate in normal range    Pulse Readings from Last 1 Encounters:  10/27/21 69         Passed - Valid encounter within last 12 months    Recent Outpatient Visits          5 months ago Acute nasopharyngitis   [facility] [place], Coralie Keens, NP   11 months ago Flu-like symptoms   [facility] [place], Coralie Keens, NP   11 months ago Erroneous encounter - disregard   [facility] [place], Coralie Keens, NP   11 months ago Flu-like symptoms   [facility] [place], Coralie Keens, NP   1 year ago Mild persistent asthma without complication   [place], DO

## 2022-05-08 ENCOUNTER — Other Ambulatory Visit: Payer: Self-pay | Admitting: Family Medicine

## 2022-05-08 DIAGNOSIS — N529 Male erectile dysfunction, unspecified: Secondary | ICD-10-CM

## 2022-05-09 NOTE — Telephone Encounter (Signed)
Requested Prescriptions  Pending Prescriptions Disp Refills  . sildenafil (REVATIO) 20 MG tablet [Pharmacy Med Name: Sildenafil Citrate 20 MG Oral Tablet] 90 tablet 0    Sig: TAKE 1 TO 5 TABLETS BY MOUTH ABOUT 30 MINUTES PRIOR TO SEX. START WITH 1 AND INCREASE AS NEEDED     Urology: Erectile Dysfunction Agents Passed - 05/08/2022  1:37 PM      Passed - AST in normal range and within 360 days    AST  Date Value Ref Range Status  10/27/2021 17 15 - 41 U/L Final         Passed - ALT in normal range and within 360 days    ALT  Date Value Ref Range Status  10/27/2021 17 0 - 44 U/L Final         Passed - Last BP in normal range    BP Readings from Last 1 Encounters:  10/27/21 110/84         Passed - Valid encounter within last 12 months    Recent Outpatient Visits          5 months ago Acute nasopharyngitis   [facility] [place], Coralie Keens, NP   11 months ago Flu-like symptoms   [facility] [place], Coralie Keens, NP   11 months ago Erroneous encounter - disregard   [facility] [place], Coralie Keens, NP   11 months ago Flu-like symptoms   [facility] [place], Coralie Keens, NP   1 year ago Mild persistent asthma without complication   [place], DO      Future Appointments            In 1 week Garnette Gunner, Coralie Keens, NP [facility], [facility]

## 2022-05-16 ENCOUNTER — Encounter: Payer: Self-pay | Admitting: Internal Medicine

## 2022-05-16 ENCOUNTER — Ambulatory Visit (INDEPENDENT_AMBULATORY_CARE_PROVIDER_SITE_OTHER): Payer: Medicare Other | Admitting: Internal Medicine

## 2022-05-16 ENCOUNTER — Other Ambulatory Visit: Payer: Self-pay

## 2022-05-16 ENCOUNTER — Telehealth: Payer: Self-pay

## 2022-05-16 ENCOUNTER — Telehealth: Payer: Self-pay | Admitting: Cardiovascular Disease

## 2022-05-16 VITALS — BP 136/81 | HR 69 | Temp 97.3°F | Ht 74.0 in | Wt 242.0 lb

## 2022-05-16 DIAGNOSIS — Z1211 Encounter for screening for malignant neoplasm of colon: Secondary | ICD-10-CM

## 2022-05-16 DIAGNOSIS — Z0000 Encounter for general adult medical examination without abnormal findings: Secondary | ICD-10-CM

## 2022-05-16 DIAGNOSIS — R350 Frequency of micturition: Secondary | ICD-10-CM

## 2022-05-16 DIAGNOSIS — N401 Enlarged prostate with lower urinary tract symptoms: Secondary | ICD-10-CM

## 2022-05-16 DIAGNOSIS — J449 Chronic obstructive pulmonary disease, unspecified: Secondary | ICD-10-CM | POA: Insufficient documentation

## 2022-05-16 DIAGNOSIS — I10 Essential (primary) hypertension: Secondary | ICD-10-CM

## 2022-05-16 DIAGNOSIS — R7309 Other abnormal glucose: Secondary | ICD-10-CM

## 2022-05-16 DIAGNOSIS — D692 Other nonthrombocytopenic purpura: Secondary | ICD-10-CM | POA: Insufficient documentation

## 2022-05-16 DIAGNOSIS — Z125 Encounter for screening for malignant neoplasm of prostate: Secondary | ICD-10-CM

## 2022-05-16 DIAGNOSIS — E6609 Other obesity due to excess calories: Secondary | ICD-10-CM | POA: Insufficient documentation

## 2022-05-16 DIAGNOSIS — Z6831 Body mass index (BMI) 31.0-31.9, adult: Secondary | ICD-10-CM

## 2022-05-16 DIAGNOSIS — Z1159 Encounter for screening for other viral diseases: Secondary | ICD-10-CM

## 2022-05-16 DIAGNOSIS — E663 Overweight: Secondary | ICD-10-CM | POA: Insufficient documentation

## 2022-05-16 DIAGNOSIS — K219 Gastro-esophageal reflux disease without esophagitis: Secondary | ICD-10-CM

## 2022-05-16 MED ORDER — LISINOPRIL 40 MG PO TABS: ORAL_TABLET | ORAL | 1 refills | Status: DC

## 2022-05-16 MED ORDER — AMLODIPINE BESYLATE 5 MG PO TABS: 5.0000 mg | ORAL_TABLET | Freq: Every day | ORAL | 1 refills | Status: DC

## 2022-05-16 MED ORDER — FLUTICASONE-SALMETEROL 100-50 MCG/ACT IN AEPB: INHALATION_SPRAY | RESPIRATORY_TRACT | 1 refills | Status: DC

## 2022-05-16 MED ORDER — OMEPRAZOLE 40 MG PO CPDR: 40.0000 mg | DELAYED_RELEASE_CAPSULE | Freq: Every day | ORAL | 1 refills | Status: DC

## 2022-05-16 MED ORDER — CARVEDILOL 6.25 MG PO TABS: ORAL_TABLET | ORAL | 1 refills | Status: DC

## 2022-05-16 MED ORDER — ALBUTEROL SULFATE HFA 108 (90 BASE) MCG/ACT IN AERS: INHALATION_SPRAY | RESPIRATORY_TRACT | 0 refills | Status: DC

## 2022-05-16 MED ORDER — MONTELUKAST SODIUM 10 MG PO TABS: 10.0000 mg | ORAL_TABLET | Freq: Every day | ORAL | 1 refills | Status: DC

## 2022-05-16 MED ORDER — ROSUVASTATIN CALCIUM 10 MG PO TABS: ORAL_TABLET | ORAL | 1 refills | Status: DC

## 2022-05-16 MED ORDER — NA SULFATE-K SULFATE-MG SULF 17.5-3.13-1.6 GM/177ML PO SOLN: 1.0000 | Freq: Once | ORAL | 0 refills | Status: AC

## 2022-05-16 NOTE — Assessment & Plan Note (Addendum)
Advair and albuterol refilled today Encouraged smoking cessation

## 2022-05-16 NOTE — Assessment & Plan Note (Signed)
Encourage diet and exercise for weight loss 

## 2022-05-16 NOTE — Progress Notes (Signed)
HPI:  Patient presents to clinic today for his welcome to Medicare exam.  Past Medical History:  Diagnosis Date   Alcohol abuse    a. up to a 12 pack of beer/day.   Arthritis    CAD in native artery    a. LHC 6/18: severe 1 vessel CAD with 90% thrombotic stenosis in the proximal LAD which was felt to be the culprit for the patient's non-STEMI. He underwent successful PCI/DES with a resolute onyx drug-eluting stent (3.0 x 18 mm). There was 0% residual stenosis. LVEF estimated at 55-65% by visual estimate. LVEDP was mildly elevated   H/O echocardiogram    a. echo 6/18: EF 60-65%, normal wall motion, grade 1 diastolic dysfunction, RV cavity size was normal with normal wall thickness and normal RV systolic function. There were no significant valvular abnormalities   Hearing loss    Heart attack ([place])    Hypertension    Marijuana abuse    a. occasional.   Tobacco abuse     Current Outpatient Medications  Medication Sig Dispense Refill   ADVAIR DISKUS 100-50 MCG/ACT AEPB INHALE 1 PUFF INTO THE LUNGS TWICE A DAY 60 each 1   albuterol (VENTOLIN HFA) 108 (90 Base) MCG/ACT inhaler INHALE 1-2 PUFFS INTO THE LUNGS EVERY 6 HOURS AS NEEDED FOR WHEEZING OR SHORTNESS OF BREATH. 8.5 each 0   amLODipine (NORVASC) 5 MG tablet Take 1 tablet (5 mg total) by mouth daily. 90 tablet 2   aspirin EC 81 MG tablet Take 1 tablet (81 mg total) by mouth daily. Hold for 3 days. 30 tablet 0   azithromycin (ZITHROMAX) 250 MG tablet Take 2 tabs today, then 1 tab daily x 4 days 6 tablet 0   carvedilol (COREG) 6.25 MG tablet TAKE 1 TABLET(6.25 MG) BY MOUTH TWICE DAILY WITH A MEAL 180 tablet 2   chlorpheniramine-HYDROcodone (TUSSIONEX PENNKINETIC ER) 10-8 MG/5ML Take 5 mLs by mouth every 12 (twelve) hours as needed for cough. 115 mL 0   lisinopril (ZESTRIL) 40 MG tablet TAKE 1 TABLET(40 MG) BY MOUTH DAILY 90 tablet 2   Melatonin 10 MG TABS Take 2 tablets by mouth daily.     montelukast (SINGULAIR) 10 MG tablet TAKE 1  TABLET BY MOUTH AT BEDTIME 90 tablet 1   oxyCODONE-acetaminophen (PERCOCET) 5-325 MG tablet Take 1 tablet by mouth every 8 (eight) hours as needed for severe pain. 15 tablet 0   rosuvastatin (CRESTOR) 10 MG tablet TAKE 1 TABLET(10 MG) BY MOUTH DAILY 90 tablet 2   sildenafil (REVATIO) 20 MG tablet TAKE 1 TO 5 TABLETS BY MOUTH ABOUT 30 MINUTES PRIOR TO SEX. START WITH 1 AND INCREASE AS NEEDED 90 tablet 0   No current facility-administered medications for this visit.    No Known Allergies  Family History  Problem Relation Age of Onset   CAD Father        s/p CABG in his 38's or 60's.   Diabetes Father    CAD Paternal Grandfather    Colon polyps Brother     Social History   Socioeconomic History   Marital status: Divorced    Spouse name: Not on file   Number of children: Not on file   Years of education: Not on file   Highest education level: High school graduate  Occupational History   Not on file  Tobacco Use   Smoking status: Some Days    Packs/day: 0.25    Years: 42.00    Total pack years: 10.50  Types: Cigarettes   Smokeless tobacco: Never   Tobacco comments:    smokes 5 cigarettes daily.   Vaping Use   Vaping Use: Never used  Substance and Sexual Activity   Alcohol use: Yes    Comment: 12 beers per week   Drug use: Not Currently    Types: Marijuana    Comment: occasional MJ.  Remote cocaine.   Sexual activity: Yes  Other Topics Concern   Not on file  Social History Narrative   Lives in [place].  He lives in a mobile home in the backyard of his girlfriend.  He works in Biomedical scientist.   Social Determinants of Health   Financial Resource Strain: Not on file  Food Insecurity: Not on file  Transportation Needs: Not on file  Physical Activity: Not on file  Stress: Not on file  Social Connections: Not on file  Intimate Partner Violence: Not on file    Hospitiliaztions: 09/2021-diverticulitis  Health Maintenance:    Flu: Never  Tetanus: >10 years  ago  Pneumovax: Never  Prevnar: Never  Zostavax: Never  Shingrix: Never  PSA: 01/2020  Colon Screening: Never  Eye Doctor: annually  Dental Exam: biannually   Providers:   PCP: Webb Silversmith, NP  Cardiologist: Dr. Fletcher Anon    I have personally reviewed and have noted:  1. The patient's medical and social history 2. Their use of alcohol, tobacco or illicit drugs 3. Their current medications and supplements 4. The patient's functional ability including ADL's, fall risks, home safety risks and hearing or visual impairment. 5. Diet and physical activities 6. Evidence for depression or mood disorder  Subjective:   Review of Systems:   Constitutional: Denies fever, malaise, fatigue, headache or abrupt weight changes.  HEENT: Denies eye pain, eye redness, ear pain, ringing in the ears, wax buildup, runny nose, nasal congestion, bloody nose, or sore throat. Respiratory: Pt reports shortness of breath. Denies difficulty breathing, cough or sputum production.   Cardiovascular: Denies chest pain, chest tightness, palpitations or swelling in the hands or feet.  Gastrointestinal: Pt reports epigastric pain. Denies bloating, constipation, diarrhea or blood in the stool.  GU: Patient reports urinary frequency.  Denies urgency, pain with urination, burning sensation, blood in urine, odor or discharge. Musculoskeletal: Denies decrease in range of motion, difficulty with gait, muscle pain or joint pain and swelling.  Skin: Denies redness, rashes, lesions or ulcercations.  Neurological: Denies dizziness, difficulty with memory, difficulty with speech or problems with balance and coordination.  Psych: Denies anxiety, depression, SI/HI.  No other specific complaints in a complete review of systems (except as listed in HPI above).  Objective:  PE:   BP 136/81 (BP Location: Right Arm, Patient Position: Sitting, Cuff Size: Normal)   Pulse 69   Temp (!) 97.3 F (36.3 C) (Temporal)   Ht '6\' 2"'   (1.88 m)   Wt 242 lb (109.8 kg)   SpO2 98%   BMI 31.07 kg/m   Wt Readings from Last 3 Encounters:  10/04/21 255 lb (115.7 kg)  06/07/21 250 lb 6.4 oz (113.6 kg)  03/29/21 261 lb (118.4 kg)    General: Appears his stated age, obese, in NAD. Skin: Warm, dry and intact.  Senile purpura noted. HEENT: Head: normal shape and size; Eyes: sclera white, no icterus, conjunctiva pink, PERRLA and EOMs intact;  Neck: Neck supple, trachea midline. No masses, lumps or thyromegaly present.  Cardiovascular: Normal rate and rhythm. S1,S2 noted.  No murmur, rubs or gallops noted. No JVD or BLE  edema. No carotid bruits noted. Pulmonary/Chest: Normal effort and positive vesicular breath sounds. No respiratory distress. No wheezes, rales or ronchi noted.  Abdomen:  Normal bowel sounds.  Musculoskeletal: Strength 5/5 BUE/BLE. No difficulty with gait. Neurological: Alert and oriented. Cranial nerves II-XII grossly intact. Coordination normal.  Psychiatric: Mood and affect normal. Behavior is normal. Judgment and thought content normal.   ECG: Normal Sinus Rhythm  BMET    Component Value Date/Time   NA 135 10/27/2021 1228   NA 140 10/04/2021 1459   K 4.5 10/27/2021 1228   CL 100 10/27/2021 1228   CO2 26 10/27/2021 1228   GLUCOSE 104 (H) 10/27/2021 1228   BUN 16 10/27/2021 1228   BUN 14 10/04/2021 1459   CREATININE 0.91 10/27/2021 1228   CREATININE 1.13 01/30/2020 1115   CALCIUM 9.4 10/27/2021 1228   GFRNONAA >60 10/27/2021 1228   GFRNONAA 69 01/30/2020 1115   GFRAA 80 01/30/2020 1115    Lipid Panel     Component Value Date/Time   CHOL 153 01/30/2020 1115   TRIG 142 01/30/2020 1115   HDL 76 01/30/2020 1115   CHOLHDL 2.0 01/30/2020 1115   VLDL 12 06/24/2018 1725   LDLCALC 54 01/30/2020 1115    CBC    Component Value Date/Time   WBC 10.2 10/27/2021 1228   RBC 5.35 10/27/2021 1228   HGB 16.4 10/27/2021 1228   HGB 16.7 10/04/2021 1459   HCT 48.2 10/27/2021 1228   HCT 49.2 10/04/2021  1459   PLT 238 10/27/2021 1228   PLT 272 10/04/2021 1459   MCV 90.1 10/27/2021 1228   MCV 92 10/04/2021 1459   MCH 30.7 10/27/2021 1228   MCHC 34.0 10/27/2021 1228   RDW 12.2 10/27/2021 1228   RDW 13.1 10/04/2021 1459   LYMPHSABS 2.0 10/04/2021 1459   MONOABS 0.5 08/02/2019 0703   EOSABS 0.1 10/04/2021 1459   BASOSABS 0.1 10/04/2021 1459    Hgb A1C Lab Results  Component Value Date   HGBA1C 5.3 01/01/2017      Assessment and Plan:   Medicare Annual Wellness Visit:  Diet: He does eat meat. He consumes fruits and veggies. He does eat fried foods. He drinks mostly water. Physical activity: Yardwork Depression/mood screen: Negative, PHQ 9 score of 0 Hearing: Intact to whispered voice Visual acuity: Grossly normal, performs annual eye exam  ADLs: Capable Fall risk: None Home safety: Good Cognitive evaluation: Intact to orientation, naming, recall and repetition EOL planning: Adv directives, full code/I agree  Preventative Medicine: He declines flu, tetanus, COVID, Pneumovax, Prevnar and Shingrix vaccines.  Referral to GI for screening colonoscopy.  Encouraged him to consume a balanced diet and exercise regimen.  Advised him to see an eye doctor and dentist annually.  We will check CBC, c-Met, lipid, A1c, PSA and hep C today.  Due dates for screening exam given to patient as part of his AVS.   Next appointment: 6 months, follow-up chronic conditions   Webb Silversmith, NP

## 2022-05-16 NOTE — Telephone Encounter (Signed)
   Pre-operative Risk Assessment    Patient Name: Bryan Ibarra.  DOB: 10/17/1955 MRN: [id number]      Request for Surgical Clearance    Procedure:   colonoscopy   Date of Surgery:  Clearance 06/13/22                                 Surgeon:  not indicated  Surgeon's Group or Practice Name:  [facility]  Phone number:  [phone] Fax number:  [phone]   Type of Clearance Requested:   - Medical    Type of Anesthesia:  General    Additional requests/questions:    Manfred Arch   05/16/2022, 3:43 PM

## 2022-05-16 NOTE — Assessment & Plan Note (Signed)
CBC today.  

## 2022-05-16 NOTE — Assessment & Plan Note (Signed)
We will start omeprazole 40 mg daily

## 2022-05-16 NOTE — Telephone Encounter (Signed)
Gastroenterology Pre-Procedure Review  Request Date: 06/13/22 Requesting Physician: Dr. Vicente Males  PATIENT REVIEW QUESTIONS: The patient responded to the following health history questions as indicated:    1. Are you having any GI issues? yes (heartburn pcp prescribed prilosec today) 2. Do you have a personal history of Polyps? no 3. Do you have a family history of Colon Cancer or Polyps? no 4. Diabetes Mellitus? no 5. Joint replacements in the past 12 months?no 6. Major health problems in the past 3 months?no 7. Any artificial heart valves, MVP, or defibrillator?yes (patient has stent Dr. Fletcher Anon is cardiologist)    [place]:    Patient reports the following regarding taking any anticoagulation/antiplatelet therapy:   Plavix, Coumadin, Eliquis, Xarelto, Lovenox, Pradaxa, Brilinta, or Effient? no Aspirin? yes (81mg )  Patient confirms/reports the following medications:  Current Outpatient Medications  Medication Sig Dispense Refill   albuterol (VENTOLIN HFA) 108 (90 Base) MCG/ACT inhaler INHALE 1-2 PUFFS INTO THE LUNGS EVERY 6 HOURS AS NEEDED FOR WHEEZING OR SHORTNESS OF BREATH. 8.5 each 0   amLODipine (NORVASC) 5 MG tablet Take 1 tablet (5 mg total) by mouth daily. 90 tablet 1   aspirin EC 81 MG tablet Take 1 tablet (81 mg total) by mouth daily. Hold for 3 days. 30 tablet 0   carvedilol (COREG) 6.25 MG tablet TAKE 1 TABLET(6.25 MG) BY MOUTH TWICE DAILY WITH A MEAL 180 tablet 1   fluticasone-salmeterol (ADVAIR DISKUS) 100-50 MCG/ACT AEPB INHALE 1 PUFF INTO THE LUNGS TWICE A DAY 60 each 1   lisinopril (ZESTRIL) 40 MG tablet TAKE 1 TABLET(40 MG) BY MOUTH DAILY 90 tablet 1   Melatonin 10 MG TABS Take 2 tablets by mouth daily.     montelukast (SINGULAIR) 10 MG tablet Take 1 tablet (10 mg total) by mouth at bedtime. 90 tablet 1   omeprazole (PRILOSEC) 40 MG capsule Take 1 capsule (40 mg total) by mouth daily. 90 capsule 1   rosuvastatin (CRESTOR) 10 MG tablet TAKE 1 TABLET(10 MG) BY  MOUTH DAILY 90 tablet 1   sildenafil (REVATIO) 20 MG tablet TAKE 1 TO 5 TABLETS BY MOUTH ABOUT 30 MINUTES PRIOR TO SEX. START WITH 1 AND INCREASE AS NEEDED 90 tablet 0   No current facility-administered medications for this visit.    Patient confirms/reports the following allergies:  No Known Allergies  No orders of the defined types were placed in this encounter.   AUTHORIZATION INFORMATION Primary Insurance: 1D#: Group #:  Secondary Insurance: 1D#: Group #:  SCHEDULE INFORMATION: Date: 06/13/22 Time: Location: [facility]

## 2022-05-17 LAB — CBC
HCT: 48 % (ref 38.5–50.0)
Hemoglobin: 17 g/dL (ref 13.2–17.1)
MCH: 33.1 pg — ABNORMAL HIGH (ref 27.0–33.0)
MCHC: 35.4 g/dL (ref 32.0–36.0)
MCV: 93.6 fL (ref 80.0–100.0)
MPV: 9.4 fL (ref 7.5–12.5)
Platelets: 247 10*3/uL (ref 140–400)
RBC: 5.13 10*6/uL (ref 4.20–5.80)
RDW: 12.7 % (ref 11.0–15.0)
WBC: 6.4 10*3/uL (ref 3.8–10.8)

## 2022-05-17 LAB — LIPID PANEL
Cholesterol: 123 mg/dL (ref <200)
HDL: 71 mg/dL (ref >=40)
LDL Cholesterol (Calc): 38 mg/dL (calc)
Non-HDL Cholesterol (Calc): 52 mg/dL (calc) (ref <130)
Total CHOL/HDL Ratio: 1.7 (calc) (ref <5.0)
Triglycerides: 68 mg/dL (ref <150)

## 2022-05-17 LAB — COMPLETE METABOLIC PANEL WITH GFR
AG Ratio: 1.8 (calc) (ref 1.0–2.5)
ALT: 36 U/L (ref 9–46)
AST: 25 U/L (ref 10–35)
Albumin: 4.3 g/dL (ref 3.6–5.1)
Alkaline phosphatase (APISO): 84 U/L (ref 35–144)
BUN: 13 mg/dL (ref 7–25)
CO2: 25 mmol/L (ref 20–32)
Calcium: 9.3 mg/dL (ref 8.6–10.3)
Chloride: 104 mmol/L (ref 98–110)
Creat: 0.87 mg/dL (ref 0.70–1.35)
Globulin: 2.4 g/dL (calc) (ref 1.9–3.7)
Glucose, Bld: 94 mg/dL (ref 65–99)
Potassium: 4.3 mmol/L (ref 3.5–5.3)
Sodium: 138 mmol/L (ref 135–146)
Total Bilirubin: 0.9 mg/dL (ref 0.2–1.2)
Total Protein: 6.7 g/dL (ref 6.1–8.1)
eGFR: 96 mL/min/{1.73_m2} (ref >=60)

## 2022-05-17 LAB — PSA: PSA: 2.25 ng/mL (ref <=4.00)

## 2022-05-17 LAB — HEPATITIS C ANTIBODY: Hepatitis C Ab: NONREACTIVE

## 2022-05-17 LAB — HEMOGLOBIN A1C
Hgb A1c MFr Bld: 5.5 % of total Hgb (ref <5.7)
Mean Plasma Glucose: 111 mg/dL
eAG (mmol/L): 6.2 mmol/L

## 2022-05-17 NOTE — Telephone Encounter (Signed)
Left message for pt to call the office.  He is already scheduled to see Christell Faith, PA, 06/12/13 and his procedure is 06/13/22.  Pt will need to move his appointment up a little sooner, so if pt calls back, please schedule pt a sooner in-office appointment.

## 2022-05-17 NOTE — Telephone Encounter (Signed)
   Name: Jeramia Saleeby.  DOB: 21-Feb-1956  MRN: [id number]  Primary Cardiologist: Kathlyn [place], MD  Chart reviewed as part of pre-operative protocol coverage. Because of HOMMER CUNLIFFE Jr.'s past medical history and time since last visit, he will require a follow-up in-office visit in order to better assess preoperative cardiovascular risk.  Pre-op covering staff: - Please schedule appointment and call patient to inform them. If patient already had an upcoming appointment within acceptable timeframe, please add "pre-op clearance" to the appointment notes so provider is aware. - Please contact requesting surgeon's office via preferred method (i.e, phone, fax) to inform them of need for appointment prior to surgery.  Lenna Sciara, NP  05/17/2022, 12:40 PM

## 2022-05-19 NOTE — Telephone Encounter (Signed)
I s/w the pt and was able to move pre op appt up sooner to 05/24/22 @ 8:50 with Gerrie Nordmann, NP. I will forward notes to NP for appt. Will send FYI to requesting office pt hs appt 05/24/22.

## 2022-05-23 NOTE — Progress Notes (Unsigned)
Cardiology Clinic Note   Patient Name: Bryan Ibarra. Date of Encounter: 05/24/2022  Primary Care Provider:  Jearld Fenton, NP Primary Cardiologist:  Kathlyn Sacramento, MD  Patient Profile    66 year old male with a past medical history of coronary artery disease, hypertension, polysubstance abuse including tobacco alcohol and marijuana and remote history of cocaine use, who is here today for follow-up and surgical clearance.  Past Medical History    Past Medical History:  Diagnosis Date   Alcohol abuse    a. up to a 12 pack of beer/day.   Arthritis    CAD in native artery    a. LHC 6/18: severe 1 vessel CAD with 90% thrombotic stenosis in the proximal LAD which was felt to be the culprit for the patient's non-STEMI. He underwent successful PCI/DES with a resolute onyx drug-eluting stent (3.0 x 18 mm). There was 0% residual stenosis. LVEF estimated at 55-65% by visual estimate. LVEDP was mildly elevated   H/O echocardiogram    a. echo 6/18: EF 60-65%, normal wall motion, grade 1 diastolic dysfunction, RV cavity size was normal with normal wall thickness and normal RV systolic function. There were no significant valvular abnormalities   Hearing loss    Heart attack ([place])    Hypertension    Marijuana abuse    a. occasional.   Tobacco abuse    Past Surgical History:  Procedure Laterality Date   APPENDECTOMY     CORONARY STENT INTERVENTION N/A 01/01/2017   Procedure: Coronary Stent Intervention;  Surgeon: Wellington Hampshire, MD;  Location: [facility];  Service: Cardiovascular;  Laterality: N/A;   KNEE SURGERY     LEFT HEART CATH AND CORONARY ANGIOGRAPHY N/A 01/01/2017   Procedure: Left Heart Cath and Coronary Angiography;  Surgeon: Wellington Hampshire, MD;  Location: [facility];  Service: Cardiovascular;  Laterality: N/A;   NASAL ENDOSCOPY N/A 08/02/2019   Procedure: NASAL ENDOSCOPY;  Surgeon: Gaye Alken, MD;  Location: [facility];  Service: ENT;   Laterality: N/A;   NASAL HEMORRHAGE CONTROL N/A 08/02/2019   Procedure: EPISTAXIS CONTROL;  Surgeon: Gaye Alken, MD;  Location: [facility];  Service: ENT;  Laterality: N/A;   shunt heart attack      Allergies  No Known Allergies  History of Present Illness    Bryan Ibarra, Bryan Ibarra. is a 66 year old male with a past medical history of coronary artery where he had a non-ST elevated myocardial infarction in June 2018.  He underwent PCI/DES placement to the LAD without complications.  At that time his ejection fraction was normal.  He also has a past medical history of hypertension polysubstance abuse including tobacco alcohol and marijuana as well as a remote history of cocaine use.  Of note he is intolerant to high-dose atorvastatin due to myalgias.  Echocardiogram done in September 2022 showed normal LV systolic function, normal diastolic function no significant valvular abnormalities.  He did have an intolerance that was noted to rosuvastatin stating that he had stopped taking it due to dizziness.  Last time he was seen in clinic on 10/04/2021 by Dr. Fletcher Anon.  He denied any chest pain but had increased swelling to his bilateral lower extremities. He had denies any excessive alcohol use nd had cut down significantly since his previous visit. He had decided to retry statin therapy and was sent for follow up blood work.   He returns to clinic today stating that he has been doing  well.  He denies any complaints overall today.  He denies chest pain, palpitations, shortness of breath, he occasionally has some swelling to his bilateral lower extremities which she states is a chronic finding.  He needs to be evaluated today for his upcoming colonoscopy.  He denies any hospitalizations or visits to the emergency department. Home Medications    Current Outpatient Medications  Medication Sig Dispense Refill   albuterol (VENTOLIN HFA) 108 (90 Base) MCG/ACT inhaler INHALE 1-2 PUFFS INTO THE LUNGS EVERY 6 HOURS  AS NEEDED FOR WHEEZING OR SHORTNESS OF BREATH. 8.5 each 0   amLODipine (NORVASC) 5 MG tablet Take 1 tablet (5 mg total) by mouth daily. 90 tablet 1   aspirin EC 81 MG tablet Take 1 tablet (81 mg total) by mouth daily. Hold for 3 days. 30 tablet 0   carvedilol (COREG) 6.25 MG tablet TAKE 1 TABLET(6.25 MG) BY MOUTH TWICE DAILY WITH A MEAL 180 tablet 1   fluticasone-salmeterol (ADVAIR DISKUS) 100-50 MCG/ACT AEPB INHALE 1 PUFF INTO THE LUNGS TWICE A DAY 60 each 1   lisinopril (ZESTRIL) 40 MG tablet TAKE 1 TABLET(40 MG) BY MOUTH DAILY 90 tablet 1   Melatonin 10 MG TABS Take 2 tablets by mouth daily.     montelukast (SINGULAIR) 10 MG tablet Take 1 tablet (10 mg total) by mouth at bedtime. 90 tablet 1   omeprazole (PRILOSEC) 40 MG capsule Take 1 capsule (40 mg total) by mouth daily. 90 capsule 1   rosuvastatin (CRESTOR) 10 MG tablet TAKE 1 TABLET(10 MG) BY MOUTH DAILY 90 tablet 1   sildenafil (REVATIO) 20 MG tablet TAKE 1 TO 5 TABLETS BY MOUTH ABOUT 30 MINUTES PRIOR TO SEX. START WITH 1 AND INCREASE AS NEEDED 90 tablet 0   No current facility-administered medications for this visit.     Family History    Family History  Problem Relation Age of Onset   Osteoporosis Mother    CAD Father        s/p CABG in his 27's or 16's.   Diabetes Father    Colon polyps Brother    CAD Paternal Grandfather    He indicated that his mother is alive. He indicated that his father is alive. He indicated that the status of his brother is unknown. He indicated that the status of his paternal grandfather is unknown.  Social History    Social History   Socioeconomic History   Marital status: Divorced    Spouse name: Not on file   Number of children: Not on file   Years of education: Not on file   Highest education level: High school graduate  Occupational History   Not on file  Tobacco Use   Smoking status: Some Days    Packs/day: 0.25    Years: 42.00    Total pack years: 10.50    Types: Cigarettes    Smokeless tobacco: Never   Tobacco comments:    smokes 5 cigarettes daily.   Vaping Use   Vaping Use: Never used  Substance and Sexual Activity   Alcohol use: Yes    Comment: 12 beers per week   Drug use: Not Currently    Types: Marijuana    Comment: occasional MJ.  Remote cocaine.   Sexual activity: Yes  Other Topics Concern   Not on file  Social History Narrative   Lives in [place].  He lives in a mobile home in the backyard of his girlfriend.  He works in Aeronautical engineer.   Social  Determinants of Health   Financial Resource Strain: Not on file  Food Insecurity: Not on file  Transportation Needs: Not on file  Physical Activity: Not on file  Stress: Not on file  Social Connections: Not on file  Intimate Partner Violence: Not on file     Review of Systems    General:  No chills, fever, night sweats or weight changes.  Cardiovascular:  No chest pain, dyspnea on exertion, endorses occasional edema, orthopnea, palpitations, paroxysmal nocturnal dyspnea. Dermatological: No rash, lesions/masses Respiratory: No cough, dyspnea Urologic: No hematuria, dysuria Abdominal:   No nausea, vomiting, diarrhea, bright red blood per rectum, melena, or hematemesis Neurologic:  No visual changes, wkns, changes in mental status. All other systems reviewed and are otherwise negative except as noted above.   Physical Exam    VS:  BP 120/80 (BP Location: Left Arm, Patient Position: Sitting, Cuff Size: Normal)   Pulse 79   Ht 6\' 2"  (1.88 m)   Wt 246 lb (111.6 kg)   SpO2 98%   BMI 31.58 kg/m  , BMI Body mass index is 31.58 kg/m.     GEN: Well nourished, well developed, in no acute distress. HEENT: normal. Neck: Supple, no JVD, carotid bruits, or masses. Cardiac: RRR, no murmurs, rubs, or gallops. No clubbing, cyanosis, edema.  Radials/DP/PT 2+ and equal bilaterally.  Respiratory:  Respirations regular and unlabored, clear to auscultation bilaterally. GI: Soft, nontender, nondistended, BS + x  4. MS: no deformity or atrophy. Skin: warm and dry, no rash. Neuro:  Strength and sensation are intact. Psych: Normal affect.  Accessory Clinical Findings    ECG personally reviewed by me today-EKG that was done at his primary care provider's office revealed a sinus rhythm with a rate of 69 and a left axis deviation- No acute changes  Lab Results  Component Value Date   WBC 6.4 05/16/2022   HGB 17.0 05/16/2022   HCT 48.0 05/16/2022   MCV 93.6 05/16/2022   PLT 247 05/16/2022   Lab Results  Component Value Date   CREATININE 0.87 05/16/2022   BUN 13 05/16/2022   NA 138 05/16/2022   K 4.3 05/16/2022   CL 104 05/16/2022   CO2 25 05/16/2022   Lab Results  Component Value Date   ALT 36 05/16/2022   AST 25 05/16/2022   ALKPHOS 88 10/27/2021   BILITOT 0.9 05/16/2022   Lab Results  Component Value Date   CHOL 123 05/16/2022   HDL 71 05/16/2022   LDLCALC 38 05/16/2022   LDLDIRECT <4 06/24/2018   TRIG 68 05/16/2022   CHOLHDL 1.7 05/16/2022    Lab Results  Component Value Date   HGBA1C 5.5 05/16/2022    Assessment & Plan   1.  Coronary artery disease involving native coronary arteries without angina.  Continue.  Remains without anginal symptoms.  He has been continued on aspirin Coreg, rosuvastatin and lisinopril.  EKG reviewed from primary care office reveals no ischemic changes.  2.  Essential hypertension blood pressure 120/80, remains stable, continues amlodipine 5 mg daily, carvedilol 6.25 mg twice daily, and lisinopril 40 mg daily  3.  Hyperlipidemia with LDL of 38 on 05/16/2022.  He is continued on Crestor 10 mg daily.  4.  Tobacco abuse as he states he continues to smoke about 5-6 cigarettes/day which is considerably better over the past few years previously smoked he is continue to encourage drug cessation.  5.  Alcohol use which he states is significantly down and he excessive use  recommended cessation as well     Mr. Ke perioperative risk of a major  cardiac event is 0.9% according to the Revised Cardiac Risk Index (RCRI).  Therefore, he is at low risk for perioperative complications.   His functional capacity is good at 8.97 METs according to the Duke Activity Status Index (DASI). Recommendations: According to ACC/AHA guidelines, no further cardiovascular testing needed.  The patient may proceed to surgery at acceptable risk.   Antiplatelet and/or Anticoagulation Recommendations: Aspirin can be held for 5 days prior to his surgery.  Please resume Aspirin post operatively when it is felt to be safe from a bleeding standpoint.       , NP 05/24/2022, 1:17 PM

## 2022-05-24 ENCOUNTER — Encounter: Payer: Self-pay | Admitting: Cardiology

## 2022-05-24 ENCOUNTER — Ambulatory Visit: Payer: Medicare Other | Attending: Cardiovascular Disease | Admitting: Cardiology

## 2022-05-24 VITALS — BP 120/80 | HR 79 | Ht 74.0 in | Wt 246.0 lb

## 2022-05-24 DIAGNOSIS — F1010 Alcohol abuse, uncomplicated: Secondary | ICD-10-CM

## 2022-05-24 DIAGNOSIS — Z720 Tobacco use: Secondary | ICD-10-CM | POA: Diagnosis present

## 2022-05-24 DIAGNOSIS — I10 Essential (primary) hypertension: Secondary | ICD-10-CM | POA: Diagnosis present

## 2022-05-24 DIAGNOSIS — E785 Hyperlipidemia, unspecified: Secondary | ICD-10-CM

## 2022-05-24 DIAGNOSIS — I2510 Atherosclerotic heart disease of native coronary artery without angina pectoris: Secondary | ICD-10-CM | POA: Diagnosis present

## 2022-05-24 NOTE — Patient Instructions (Signed)
Medication Instructions:  No changes at this time.   *If you need a refill on your cardiac medications before your next appointment, please call your pharmacy*   Lab Work: None  If you have labs (blood work) drawn today and your tests are completely normal, you will receive your results only by: MyChart Message (if you have MyChart) OR A paper copy in the mail If you have any lab test that is abnormal or we need to change your treatment, we will call you to review the results.   Testing/Procedures: None   Follow-Up: At [facility], you and your health needs are our priority.  As part of our continuing mission to provide you with exceptional heart care, we have created designated Provider Care Teams.  These Care Teams include your primary Cardiologist (physician) and Advanced Practice Providers (APPs -  Physician Assistants and Nurse Practitioners) who all work together to provide you with the care you need, when you need it.   Your next appointment:   6 month(s)  The format for your next appointment:   In Person  Provider:   Muhammad Arida, MD or Sheri Hammock, NP        Important Information About Sugar       

## 2022-05-25 ENCOUNTER — Telehealth: Payer: Self-pay

## 2022-05-25 NOTE — Telephone Encounter (Signed)
Cardiac clearance received from Dr. Fletcher Anon.  Patient has been cleared to have his colonoscopy.  Thanks,  Bryan Ibarra, [place]

## 2022-06-07 ENCOUNTER — Other Ambulatory Visit: Payer: Self-pay | Admitting: Internal Medicine

## 2022-06-07 DIAGNOSIS — Z0000 Encounter for general adult medical examination without abnormal findings: Secondary | ICD-10-CM

## 2022-06-07 NOTE — Telephone Encounter (Signed)
Requested Prescriptions  Pending Prescriptions Disp Refills   albuterol (VENTOLIN HFA) 108 (90 Base) MCG/ACT inhaler [Pharmacy Med Name: ALBUTEROL HFA (VENTOLIN) INH] 8.5 g 0    Sig: INHALE 1-2 PUFFS INTO THE LUNGS EVERY 6 HOURS AS NEEDED FOR WHEEZING OR SHORTNESS OF BREATH.     Pulmonology:  Beta Agonists 2 Passed - 06/07/2022  5:13 AM      Passed - Last BP in normal range    BP Readings from Last 1 Encounters:  05/24/22 120/80         Passed - Last Heart Rate in normal range    Pulse Readings from Last 1 Encounters:  05/24/22 79         Passed - Valid encounter within last 12 months    Recent Outpatient Visits           3 weeks ago Welcome to [employer] preventive visit   [facility] [place], Salvadore Oxford, NP   6 months ago Acute nasopharyngitis   [facility] [place], Salvadore Oxford, NP   1 year ago Flu-like symptoms   [facility] [place], Salvadore Oxford, NP   1 year ago Erroneous encounter - disregard   [facility] [place], Salvadore Oxford, NP   1 year ago Flu-like symptoms   [facility] [place] [place], Salvadore Oxford, NP

## 2022-06-08 ENCOUNTER — Encounter: Payer: Self-pay | Admitting: Cardiology

## 2022-06-08 ENCOUNTER — Telehealth: Payer: Self-pay

## 2022-06-08 NOTE — Telephone Encounter (Signed)
Patient is calling because he wants to cancel colonoscopy that is schedule for 06/13/2022 he states he is not ready to have it done at this time. He states he will call us back to reschedule the procedure called trish and she cancel the procedure

## 2022-06-12 ENCOUNTER — Ambulatory Visit: Payer: Medicare Other | Admitting: Physician Assistant

## 2022-06-13 ENCOUNTER — Encounter: Admission: RE | Payer: Self-pay | Source: Home / Self Care

## 2022-06-13 ENCOUNTER — Ambulatory Visit: Admission: RE | Admit: 2022-06-13 | Payer: Medicare Other | Source: Home / Self Care | Admitting: Gastroenterology

## 2022-06-13 SURGERY — COLONOSCOPY WITH PROPOFOL
Anesthesia: General

## 2022-07-04 ENCOUNTER — Other Ambulatory Visit: Payer: Self-pay | Admitting: Internal Medicine

## 2022-07-04 DIAGNOSIS — Z0000 Encounter for general adult medical examination without abnormal findings: Secondary | ICD-10-CM

## 2022-07-05 ENCOUNTER — Other Ambulatory Visit (HOSPITAL_COMMUNITY): Payer: Self-pay

## 2022-07-05 ENCOUNTER — Other Ambulatory Visit: Payer: Self-pay | Admitting: Internal Medicine

## 2022-07-05 DIAGNOSIS — Z0000 Encounter for general adult medical examination without abnormal findings: Secondary | ICD-10-CM

## 2022-07-05 MED ORDER — ALBUTEROL SULFATE HFA 108 (90 BASE) MCG/ACT IN AERS
INHALATION_SPRAY | RESPIRATORY_TRACT | 0 refills | Status: DC
  Filled 2022-07-05: qty 6.7, 25d supply, fill #0

## 2022-07-05 NOTE — Telephone Encounter (Signed)
Change of pharmacy Requested Prescriptions  Pending Prescriptions Disp Refills   albuterol (VENTOLIN HFA) 108 (90 Base) MCG/ACT inhaler [Pharmacy Med Name: ALBUTEROL HFA (PROAIR) INHALER] 6.7 each 0    Sig: INHALE 1-2 PUFFS INTO THE LUNGS EVERY 6 HOURS AS NEEDED FOR WHEEZING OR SHORTNESS OF BREATH.     Pulmonology:  Beta Agonists 2 Passed - 07/05/2022 12:11 PM      Passed - Last BP in normal range    BP Readings from Last 1 Encounters:  05/24/22 120/80         Passed - Last Heart Rate in normal range    Pulse Readings from Last 1 Encounters:  05/24/22 79         Passed - Valid encounter within last 12 months    Recent Outpatient Visits           1 month ago Welcome to [employer] preventive visit   [facility] [place], Salvadore Oxford, NP   7 months ago Acute nasopharyngitis   [facility] [place], Salvadore Oxford, NP   1 year ago Flu-like symptoms   [facility] [place], Salvadore Oxford, NP   1 year ago Erroneous encounter - disregard   [facility] [place], Salvadore Oxford, NP   1 year ago Flu-like symptoms   [facility] [place], Salvadore Oxford, NP

## 2022-07-08 ENCOUNTER — Emergency Department: Payer: Medicare Other

## 2022-07-08 ENCOUNTER — Other Ambulatory Visit: Payer: Self-pay

## 2022-07-08 ENCOUNTER — Emergency Department
Admission: EM | Admit: 2022-07-08 | Discharge: 2022-07-08 | Disposition: A | Discharge status: Home / Self Care | Payer: Medicare Other | Attending: Emergency Medicine | Admitting: Emergency Medicine

## 2022-07-08 DIAGNOSIS — M5431 Sciatica, right side: Secondary | ICD-10-CM

## 2022-07-08 DIAGNOSIS — M5441 Lumbago with sciatica, right side: Secondary | ICD-10-CM | POA: Insufficient documentation

## 2022-07-08 DIAGNOSIS — I2510 Atherosclerotic heart disease of native coronary artery without angina pectoris: Secondary | ICD-10-CM | POA: Diagnosis not present

## 2022-07-08 DIAGNOSIS — I10 Essential (primary) hypertension: Secondary | ICD-10-CM | POA: Diagnosis not present

## 2022-07-08 MED ORDER — FAMOTIDINE 20 MG PO TABS: 20.0000 mg | ORAL_TABLET | Freq: Two times a day (BID) | ORAL | 0 refills | Status: DC

## 2022-07-08 MED ORDER — PREDNISONE 10 MG (21) PO TBPK: ORAL_TABLET | ORAL | 0 refills | Status: DC

## 2022-07-08 MED ORDER — OXYCODONE-ACETAMINOPHEN 5-325 MG PO TABS: 1.0000 | ORAL_TABLET | Freq: Four times a day (QID) | ORAL | 0 refills | Status: DC | PRN

## 2022-07-08 MED ORDER — NAPROXEN 500 MG PO TABS
500.0000 mg | ORAL_TABLET | Freq: Once | ORAL | Status: AC
  Administered 2022-07-08: 500 mg via ORAL
  Filled 2022-07-08: qty 1

## 2022-07-08 MED ORDER — OXYCODONE-ACETAMINOPHEN 5-325 MG PO TABS
1.0000 | ORAL_TABLET | Freq: Once | ORAL | Status: AC
  Administered 2022-07-08: 1 via ORAL
  Filled 2022-07-08: qty 1

## 2022-07-08 MED ORDER — NAPROXEN 500 MG PO TABS: 500.0000 mg | ORAL_TABLET | Freq: Two times a day (BID) | ORAL | 0 refills | Status: DC

## 2022-07-08 NOTE — Discharge Instructions (Signed)
Your CT scan does not show any serious injuries of the spine, but does show signs of a pinched nerve at your right low back.  Take prednisone and pain medications as prescribed to help alleviate this.  Follow-up with orthopedics in a week for further evaluation.  When your pain starts to feel more tolerable, increased gentle activity such as walking.  Review the enclosed instructions for exercises you can do to help alleviate the symptoms.

## 2022-07-08 NOTE — ED Provider Triage Note (Signed)
Emergency Medicine Provider Triage Evaluation Note  Bryan Gathright. , a 66 y.o. male  was evaluated in triage.  Pt complains of right sided back pain that radiates down the back of his leg. Reports it hurts to sit. Began 1 week ago. He has been taking ibuprofen and patches. Denies injury. No urinary or fecal incontinence or retention, no saddle anesthesia.  Able to ambulate  Patient Active Problem List   Diagnosis Date Noted   Class 1 obesity due to excess calories with body mass index (BMI) of 31.0 to 31.9 in adult 05/16/2022   GERD (gastroesophageal reflux disease) 05/16/2022   Senile purpura (HCC) 05/16/2022   Alcohol abuse 08/02/2019   Hypertension    CAD in native artery    HLD (hyperlipidemia)    NSTEMI (non-ST elevated myocardial infarction) (HCC) 01/01/2017     Review of Systems  Positive: Back pain, right leg pain Negative: Abdominal pain  Physical Exam  There were no vitals taken for this visit. Gen:   Awake, no distress   Resp:  Normal effort  MSK:   Moves extremities without difficulty  Other:    Medical Decision Making  Medically screening exam initiated at 7:45 AM.  Appropriate orders placed.  Bryan Ibarra. was informed that the remainder of the evaluation will be completed by another provider, this initial triage assessment does not replace that evaluation, and the importance of remaining in the ED until their evaluation is complete.     Jackelyn Hoehn, PA-C 07/08/22 [phone]

## 2022-07-08 NOTE — ED Triage Notes (Signed)
Pt states that he has lower back pain that goes down into his R leg- pt states that he first noticed it about a week ago- pt has been taking ibuprofen at home for it but it is not helping

## 2022-07-08 NOTE — ED Provider Notes (Signed)
[facility] Provider Note    Event Date/Time   First MD Initiated Contact with Patient 07/08/22 [phone]     (approximate)   History   Back Pain   HPI  Bryan Ibarra. is a 66 y.o. male with a history of CAD hypertension GERD and alcohol abuse who comes ED complaining of right lower back pain that radiates down his right leg all the way to the ankle.  This has been going on for about 2 weeks, worse in the past week.  Started after he tried to lift a generator which was too heavy for him to move, and then a week ago he tried to lift a metal bleacher which again was too heavy for him to move, after which his lower back started to hurt, worse with any movement.  No leg weakness, no difficulty with urination, no groin numbness.     Physical Exam   Triage Vital Signs: ED Triage Vitals  Enc Vitals Group     BP 07/08/22 0746 136/87     Pulse Rate 07/08/22 0746 68     Resp 07/08/22 0746 18     Temp 07/08/22 0746 97.6 F (36.4 C)     Temp Source 07/08/22 0746 Oral     SpO2 07/08/22 0746 96 %     Weight 07/08/22 0747 240 lb (108.9 kg)     Height 07/08/22 0747 6\' 2"  (1.88 m)     Head Circumference --      Peak Flow --      Pain Score 07/08/22 0746 9     Pain Loc --      Pain Edu? --      Excl. in GC? --     Most recent vital signs: Vitals:   07/08/22 0746  BP: 136/87  Pulse: 68  Resp: 18  Temp: 97.6 F (36.4 C)  SpO2: 96%    General: Awake, no distress.  CV:  Good peripheral perfusion.  Normal distal pulses Resp:  Normal effort.  Abd:  No distention.  Soft nontender Other:  Reproducible pain in the right lower back L5 region.  No midline spinal tenderness.   ED Results / Procedures / Treatments   Labs (all labs ordered are listed, but only abnormal results are displayed) Labs Reviewed - No data to display   RADIOLOGY CT lumbar spine interpreted by me, negative for fracture.  Radiology report  reviewed   PROCEDURES:  Procedures   MEDICATIONS ORDERED IN ED: Medications  oxyCODONE-acetaminophen (PERCOCET/ROXICET) 5-325 MG per tablet 1 tablet (1 tablet Oral Given 07/08/22 0854)  naproxen (NAPROSYN) tablet 500 mg (500 mg Oral Given 07/08/22 0854)     IMPRESSION / MDM / ASSESSMENT AND PLAN / ED COURSE  I reviewed the triage vital signs and the nursing notes.                              Differential diagnosis includes, but is not limited to, vertebral compression fracture, intervertebral disc herniation, muscle strain, foraminal stenosis, sciatica  Patient presents with subacute, worsening right low back pain, not relieved by heat therapy and NSAIDs at home.  By history and exam, most likely muscular strain but with escalating symptoms will obtain CT to rule out more serious injury.  Doubt cauda equina, no signs of infection.   ----------------------------------------- 10:25 AM on 07/08/2022 ----------------------------------------- CT negative for acute spinal injury.  Will treat for sciatica with  steroids, naproxen, short course of Percocet.  Pepcid for GI prophylaxis.      FINAL CLINICAL IMPRESSION(S) / ED DIAGNOSES   Final diagnoses:  Sciatica of right side     Rx / DC Orders   ED Discharge Orders          Ordered    predniSONE (STERAPRED UNI-PAK 21 TAB) 10 MG (21) TBPK tablet        07/08/22 1023    naproxen (NAPROSYN) 500 MG tablet  2 times daily with meals        07/08/22 1023    oxyCODONE-acetaminophen (PERCOCET) 5-325 MG tablet  Every 6 hours PRN        07/08/22 1023    famotidine (PEPCID) 20 MG tablet  2 times daily        07/08/22 1023             Note:  This document was prepared using Dragon voice recognition software and may include unintentional dictation errors.   Sharman Cheek, MD 07/08/22 1026

## 2022-07-10 ENCOUNTER — Other Ambulatory Visit: Payer: Self-pay | Admitting: Internal Medicine

## 2022-07-10 NOTE — Telephone Encounter (Signed)
Medication Refill - Medication: oxyCODONE-acetaminophen (PERCOCET) 5-325 MG tablet   Has the patient contacted their pharmacy? Yes.   (Agent: If no, request that the patient contact the pharmacy for the refill. If patient does not wish to contact the pharmacy document the reason why and proceed with request.) (Agent: If yes, when and what did the pharmacy advise?)  Preferred Pharmacy (with phone number or street name):  [id number] - [place], [place] - [address]  [address] [place] [address]  Phone: [phone] Fax: [phone]   Has the patient been seen for an appointment in the last year OR does the patient have an upcoming appointment? Yes.    Agent: Please be advised that RX refills may take up to 3 business days. We ask that you follow-up with your pharmacy.

## 2022-07-10 NOTE — Telephone Encounter (Signed)
Requested medication (s) are due for refill today -provider review   Requested medication (s) are on the active medication list -yes  Future visit scheduled -no  Last refill: 07/08/22 #8  Notes to clinic: non delegated Rx, outside provider  Requested Prescriptions  Pending Prescriptions Disp Refills   oxyCODONE-acetaminophen (PERCOCET) 5-325 MG tablet 8 tablet 0    Sig: Take 1 tablet by mouth every 6 (six) hours as needed for severe pain.     Not Delegated - Analgesics:  Opioid Agonist Combinations Failed - 07/10/2022  1:52 PM      Failed - This refill cannot be delegated      Failed - Urine Drug Screen completed in last 360 days      Passed - Valid encounter within last 3 months    Recent Outpatient Visits           1 month ago Welcome to [employer] preventive visit   [facility] [place], Salvadore Oxford, NP   7 months ago Acute nasopharyngitis   [facility] [place], Salvadore Oxford, NP   1 year ago Flu-like symptoms   [facility] [place], Salvadore Oxford, NP   1 year ago Erroneous encounter - disregard   [facility] [place], Salvadore Oxford, NP   1 year ago Flu-like symptoms   [facility] [place], Salvadore Oxford, NP                 Requested Prescriptions  Pending Prescriptions Disp Refills   oxyCODONE-acetaminophen (PERCOCET) 5-325 MG tablet 8 tablet 0    Sig: Take 1 tablet by mouth every 6 (six) hours as needed for severe pain.     Not Delegated - Analgesics:  Opioid Agonist Combinations Failed - 07/10/2022  1:52 PM      Failed - This refill cannot be delegated      Failed - Urine Drug Screen completed in last 360 days      Passed - Valid encounter within last 3 months    Recent Outpatient Visits           1 month ago Welcome to [employer] preventive visit   [facility] [place], Salvadore Oxford, NP   7 months ago Acute nasopharyngitis   [facility] [place], Salvadore Oxford, NP   1 year ago Flu-like  symptoms   [facility] [place], Salvadore Oxford, NP   1 year ago Erroneous encounter - disregard   [facility] [place], Salvadore Oxford, NP   1 year ago Flu-like symptoms   [facility] [place], Salvadore Oxford, NP

## 2022-07-11 ENCOUNTER — Telehealth: Payer: Self-pay | Admitting: Internal Medicine

## 2022-07-11 NOTE — Telephone Encounter (Signed)
Pt states he was sen in ED and advised to get 3 - 4 day refill from PCP for Oxycodone until he can see the orthopedic on Friday / please call pt and advise

## 2022-07-11 NOTE — Telephone Encounter (Signed)
Requested medication (s) are due for refill today -provider review   Requested medication (s) are on the active medication list -yes  Future visit scheduled -no  Last refill: 07/08/22 #8  Notes to clinic: non delegated Rx  Requested Prescriptions  Pending Prescriptions Disp Refills   oxyCODONE-acetaminophen (PERCOCET) 5-325 MG tablet 8 tablet 0    Sig: Take 1 tablet by mouth every 6 (six) hours as needed for severe pain.     Not Delegated - Analgesics:  Opioid Agonist Combinations Failed - 07/11/2022  9:56 AM      Failed - This refill cannot be delegated      Failed - Urine Drug Screen completed in last 360 days      Passed - Valid encounter within last 3 months    Recent Outpatient Visits           1 month ago Welcome to [employer] preventive visit   [facility] [place], Salvadore Oxford, NP   7 months ago Acute nasopharyngitis   [facility] [place], Salvadore Oxford, NP   1 year ago Flu-like symptoms   [facility] [place], Salvadore Oxford, NP   1 year ago Erroneous encounter - disregard   [facility] [place], Salvadore Oxford, NP   1 year ago Flu-like symptoms   [facility] [place], Salvadore Oxford, NP                 Requested Prescriptions  Pending Prescriptions Disp Refills   oxyCODONE-acetaminophen (PERCOCET) 5-325 MG tablet 8 tablet 0    Sig: Take 1 tablet by mouth every 6 (six) hours as needed for severe pain.     Not Delegated - Analgesics:  Opioid Agonist Combinations Failed - 07/11/2022  9:56 AM      Failed - This refill cannot be delegated      Failed - Urine Drug Screen completed in last 360 days      Passed - Valid encounter within last 3 months    Recent Outpatient Visits           1 month ago Welcome to [employer] preventive visit   [facility] [place], Salvadore Oxford, NP   7 months ago Acute nasopharyngitis   [facility] [place], Salvadore Oxford, NP   1 year ago Flu-like symptoms   [facility] [place], Salvadore Oxford, NP   1 year ago Erroneous encounter - disregard   [facility] [place], Salvadore Oxford, NP   1 year ago Flu-like symptoms   [facility] [place], Salvadore Oxford, NP

## 2022-07-11 NOTE — Telephone Encounter (Signed)
Pt called saying he has an appt with his orthopedic doctor on Friday.  He wants to know if he can get enough to last until then.  CB@  [phone]

## 2022-07-11 NOTE — Telephone Encounter (Signed)
Copied from CRM [id number]. Topic: General - Other >> Jul 11, 2022  9:40 AM Bryan Ibarra wrote: Reason for CRM: Medication Refill - Medication: oxyCODONE-acetaminophen (PERCOCET) 5-325 MG tablet [[id number]] - the patient has 0 tablets remaining   Has the patient contacted their pharmacy? No. The patient was uncertain of who to contact until they can be seen by an orthopedic specialist  (Agent: If no, request that the patient contact the pharmacy for the refill. If patient does not wish to contact the pharmacy document the reason why and proceed with request.) (Agent: If yes, when and what did the pharmacy advise?)  Preferred Pharmacy (with phone number or street name): [id number] [place] - [address] [place] [address] Phone: [phone] Fax: [phone] Hours: Not open 24 hours   Has the patient been seen for an appointment in the last year OR does the patient have an upcoming appointment? Yes.    Agent: Please be advised that RX refills may take up to 3 business days. We ask that you follow-up with your pharmacy.

## 2022-07-12 ENCOUNTER — Other Ambulatory Visit (HOSPITAL_COMMUNITY): Payer: Self-pay

## 2022-07-12 NOTE — Telephone Encounter (Signed)
He would have to be seen here for an ER follow-up before I would refill a controlled substance.

## 2022-07-14 NOTE — Telephone Encounter (Signed)
Pt states he has follow with othro today and they refilled his RX    Thanks,   -Vernona Rieger

## 2022-07-19 ENCOUNTER — Ambulatory Visit
Admission: RE | Admit: 2022-07-19 | Discharge: 2022-07-19 | Disposition: A | Discharge status: Home / Self Care | Payer: Medicare Other | Source: Ambulatory Visit | Attending: Internal Medicine | Admitting: Internal Medicine

## 2022-07-19 DIAGNOSIS — F17210 Nicotine dependence, cigarettes, uncomplicated: Secondary | ICD-10-CM | POA: Insufficient documentation

## 2022-07-19 DIAGNOSIS — Z87891 Personal history of nicotine dependence: Secondary | ICD-10-CM | POA: Diagnosis present

## 2022-07-25 ENCOUNTER — Other Ambulatory Visit: Payer: Self-pay | Admitting: Acute Care

## 2022-07-25 DIAGNOSIS — Z122 Encounter for screening for malignant neoplasm of respiratory organs: Secondary | ICD-10-CM

## 2022-07-25 DIAGNOSIS — F17210 Nicotine dependence, cigarettes, uncomplicated: Secondary | ICD-10-CM

## 2022-07-25 DIAGNOSIS — Z87891 Personal history of nicotine dependence: Secondary | ICD-10-CM

## 2022-07-26 ENCOUNTER — Telehealth: Payer: Self-pay | Admitting: Internal Medicine

## 2022-07-26 NOTE — Telephone Encounter (Signed)
Patient inquiring about CT CHEST LUNG results. Patient is concerned because he was able to view results on My Chart and the results are concerning. He would like a follow up call from PCP today.

## 2022-07-27 NOTE — Telephone Encounter (Signed)
The pt was informed that the ordering provider will call with the results of the CT scan

## 2022-07-30 ENCOUNTER — Other Ambulatory Visit: Payer: Self-pay | Admitting: Internal Medicine

## 2022-07-30 DIAGNOSIS — Z0000 Encounter for general adult medical examination without abnormal findings: Secondary | ICD-10-CM

## 2022-08-01 NOTE — Telephone Encounter (Signed)
Requested Prescriptions  Pending Prescriptions Disp Refills   ADVAIR DISKUS 100-50 MCG/ACT AEPB [Pharmacy Med Name: ADVAIR 100-50 DISKUS] 60 each 0    Sig: INHALE 1 PUFF INTO THE LUNGS TWICE A DAY     Pulmonology:  Combination Products Passed - 07/30/2022  9:19 AM      Passed - Valid encounter within last 12 months    Recent Outpatient Visits           2 months ago Welcome to [employer] preventive visit   [facility] [place], Coralie Keens, NP   8 months ago Acute nasopharyngitis   [facility] [place], Coralie Keens, NP   1 year ago Flu-like symptoms   [facility] [place], Coralie Keens, NP   1 year ago Erroneous encounter - disregard   [facility] [place], Coralie Keens, NP   1 year ago Flu-like symptoms   [facility], Coralie Keens, NP

## 2022-08-11 ENCOUNTER — Other Ambulatory Visit: Payer: Self-pay | Admitting: Orthopedic Surgery

## 2022-08-11 DIAGNOSIS — M4808 Spinal stenosis, sacral and sacrococcygeal region: Secondary | ICD-10-CM

## 2022-08-17 ENCOUNTER — Telehealth: Payer: Self-pay | Admitting: Cardiovascular Disease

## 2022-08-17 ENCOUNTER — Ambulatory Visit
Admission: RE | Admit: 2022-08-17 | Discharge: 2022-08-17 | Disposition: A | Discharge status: Home / Self Care | Payer: Medicare Other | Source: Ambulatory Visit | Attending: Orthopedic Surgery | Admitting: Orthopedic Surgery

## 2022-08-17 ENCOUNTER — Ambulatory Visit: Payer: Medicare Other

## 2022-08-17 DIAGNOSIS — M4808 Spinal stenosis, sacral and sacrococcygeal region: Secondary | ICD-10-CM | POA: Diagnosis not present

## 2022-08-17 NOTE — Telephone Encounter (Signed)
Patient calling to make Dr Fletcher Anon aware that he had a MRI done today. Because of his stent the dr thought he should call our office to notify Dr. Fletcher Anon. Please advise

## 2022-08-18 NOTE — Telephone Encounter (Signed)
The MRI should have no effect on the stent.

## 2022-08-18 NOTE — Telephone Encounter (Signed)
Patient has been made aware.

## 2022-08-18 NOTE — Telephone Encounter (Signed)
Spoke to the patient about his MRI. He was told to call his cardiologist to make sure that the MRI that he had yesterday did not have an adverse effect on his his stent.

## 2022-08-28 ENCOUNTER — Other Ambulatory Visit: Payer: Self-pay | Admitting: Internal Medicine

## 2022-08-28 DIAGNOSIS — Z0000 Encounter for general adult medical examination without abnormal findings: Secondary | ICD-10-CM

## 2022-08-28 NOTE — Telephone Encounter (Signed)
Medication Refill - Medication: albuterol (VENTOLIN HFA) 108 (90 Base) MCG/ACT inhaler   Has the patient contacted their pharmacy? Yes.     Pharmacy referred patient to PCP.   Preferred Pharmacy (with phone number or street name):  [id number] [place], [place] Phone: [phone]  Fax: [phone]     Has the patient been seen for an appointment in the last year OR does the patient have an upcoming appointment? Yes.    Agent: Please be advised that RX refills may take up to 3 business days. We ask that you follow-up with your pharmacy.

## 2022-08-28 NOTE — Telephone Encounter (Signed)
Patient is wondering if he can get a 90day supply of the medication.

## 2022-08-29 MED ORDER — ALBUTEROL SULFATE HFA 108 (90 BASE) MCG/ACT IN AERS: INHALATION_SPRAY | RESPIRATORY_TRACT | 0 refills | Status: DC

## 2022-08-29 NOTE — Telephone Encounter (Signed)
Requested Prescriptions  Pending Prescriptions Disp Refills   albuterol (VENTOLIN HFA) 108 (90 Base) MCG/ACT inhaler 6.7 each 0     Pulmonology:  Beta Agonists 2 Passed - 08/28/2022  2:07 PM      Passed - Last BP in normal range    BP Readings from Last 1 Encounters:  07/08/22 136/87         Passed - Last Heart Rate in normal range    Pulse Readings from Last 1 Encounters:  07/08/22 68         Passed - Valid encounter within last 12 months    Recent Outpatient Visits           3 months ago Welcome to [employer] preventive visit   [facility] [place], Coralie Keens, NP   9 months ago Acute nasopharyngitis   [facility] [place], Coralie Keens, NP   1 year ago Flu-like symptoms   [facility] [place], Coralie Keens, NP   1 year ago Erroneous encounter - disregard   [facility] [place], Coralie Keens, NP   1 year ago Flu-like symptoms   [facility] [place], Coralie Keens, [place]

## 2022-08-30 ENCOUNTER — Other Ambulatory Visit: Payer: Self-pay | Admitting: Internal Medicine

## 2022-08-30 DIAGNOSIS — Z0000 Encounter for general adult medical examination without abnormal findings: Secondary | ICD-10-CM

## 2022-08-30 NOTE — Telephone Encounter (Signed)
Requested medication (s) are due for refill today:   Yes  Requested medication (s) are on the active medication list:   Yes  Future visit scheduled:   No   Last ordered: 08/01/2022 60 each, 0 refills  Returned because this is not on his formulary.   Alternative being requested  Requested Prescriptions  Pending Prescriptions Disp Refills   WIXELA INHUB 100-50 MCG/ACT AEPB [Pharmacy Med Name: WIXELA 100-50 INHUB] 60 each 0    Sig: INHALE 1 PUFF INTO THE LUNGS TWICE A DAY     Pulmonology:  Combination Products Passed - 08/30/2022  7:53 AM      Passed - Valid encounter within last 12 months    Recent Outpatient Visits           3 months ago Welcome to [employer] preventive visit   [facility] [place], Coralie Keens, NP   9 months ago Acute nasopharyngitis   [facility] [place], Coralie Keens, NP   1 year ago Flu-like symptoms   [facility] [place], Coralie Keens, NP   1 year ago Erroneous encounter - disregard   [facility] [place], Coralie Keens, NP   1 year ago Flu-like symptoms   [facility] [place], Coralie Keens, [place]

## 2022-08-31 ENCOUNTER — Other Ambulatory Visit: Payer: Self-pay | Admitting: Internal Medicine

## 2022-08-31 DIAGNOSIS — Z0000 Encounter for general adult medical examination without abnormal findings: Secondary | ICD-10-CM

## 2022-08-31 DIAGNOSIS — J432 Centrilobular emphysema: Secondary | ICD-10-CM | POA: Insufficient documentation

## 2022-08-31 NOTE — Telephone Encounter (Signed)
Requested medication (s) are due for refill today: alternative medication requested  Requested medication (s) are on the active medication list: yes  Last refill:  08/31/22  Future visit scheduled: yes  Notes to clinic:  Alternative Requested.      Requested Prescriptions  Pending Prescriptions Disp Refills   ADVAIR HFA 45-21 MCG/ACT inhaler [Pharmacy Med Name: ADVAIR HFA 45-21 [place]  0     Pulmonology:  Combination Products Passed - 08/31/2022  8:01 AM      Passed - Valid encounter within last 12 months    Recent Outpatient Visits           3 months ago Welcome to [employer] preventive visit   [facility] [place], Coralie Keens, NP   9 months ago Acute nasopharyngitis   [facility] [place], Coralie Keens, NP   1 year ago Flu-like symptoms   [facility] [place], Coralie Keens, NP   1 year ago Erroneous encounter - disregard   [facility] [place], Coralie Keens, NP   1 year ago Flu-like symptoms   [facility] [place], Coralie Keens, [place]

## 2022-09-27 ENCOUNTER — Other Ambulatory Visit: Payer: Self-pay | Admitting: Internal Medicine

## 2022-09-27 DIAGNOSIS — Z0000 Encounter for general adult medical examination without abnormal findings: Secondary | ICD-10-CM

## 2022-09-27 NOTE — Telephone Encounter (Signed)
Requested Prescriptions  Pending Prescriptions Disp Refills   albuterol (VENTOLIN HFA) 108 (90 Base) MCG/ACT inhaler [Pharmacy Med Name: ALBUTEROL HFA (PROVENTIL) INH] 6.7 each 0    Sig: INHALE 1-2 PUFFS INTO THE LUNGS EVERY 6 HOURS AS NEEDED FOR WHEEZING OR SHORTNESS OF BREATH.     Pulmonology:  Beta Agonists 2 Passed - 09/27/2022  4:57 AM      Passed - Last BP in normal range    BP Readings from Last 1 Encounters:  07/08/22 136/87         Passed - Last Heart Rate in normal range    Pulse Readings from Last 1 Encounters:  07/08/22 68         Passed - Valid encounter within last 12 months    Recent Outpatient Visits           4 months ago Welcome to [employer] preventive visit   [facility] [place], Coralie Keens, NP   10 months ago Acute nasopharyngitis   [facility] [place], Coralie Keens, NP   1 year ago Flu-like symptoms   [facility] [place], Coralie Keens, NP   1 year ago Erroneous encounter - disregard   [facility] [place], Coralie Keens, NP   1 year ago Flu-like symptoms   [facility] [place], Coralie Keens, [place]

## 2022-10-17 ENCOUNTER — Other Ambulatory Visit: Payer: Self-pay | Admitting: Internal Medicine

## 2022-10-17 DIAGNOSIS — Z0000 Encounter for general adult medical examination without abnormal findings: Secondary | ICD-10-CM

## 2022-10-18 NOTE — Telephone Encounter (Signed)
Requested Prescriptions  Pending Prescriptions Disp Refills   omeprazole (PRILOSEC) 40 MG capsule [Pharmacy Med Name: OMEPRAZOLE DR 40 MG CAPSULE] 90 capsule 1    Sig: TAKE 1 CAPSULE (40 MG TOTAL) BY MOUTH DAILY.     Gastroenterology: Proton Pump Inhibitors Passed - 10/17/2022 11:55 AM      Passed - Valid encounter within last 12 months    Recent Outpatient Visits           5 months ago Welcome to [employer] preventive visit   [facility] [place], [place], [place]   10 months ago Acute nasopharyngitis   [facility] [place], [place], NP   1 year ago Flu-like symptoms   [facility] [place], [place], NP   1 year ago Erroneous encounter - disregard   [facility] [place], Coralie Keens, NP   1 year ago Flu-like symptoms   [facility] [place], Coralie Keens, NP               carvedilol (COREG) 6.25 MG tablet [Pharmacy Med Name: CARVEDILOL 6.25 MG TABLET] 180 tablet 1    Sig: TAKE 1 TABLET(6.25 MG) BY MOUTH TWICE DAILY WITH A MEAL     Cardiovascular: Beta Blockers 3 Passed - 10/17/2022 11:55 AM      Passed - Cr in normal range and within 360 days    Creat  Date Value Ref Range Status  05/16/2022 0.87 0.70 - 1.35 mg/dL Final         Passed - AST in normal range and within 360 days    AST  Date Value Ref Range Status  05/16/2022 25 10 - 35 U/L Final         Passed - ALT in normal range and within 360 days    ALT  Date Value Ref Range Status  05/16/2022 36 9 - 46 U/L Final         Passed - Last BP in normal range    BP Readings from Last 1 Encounters:  07/08/22 136/87         Passed - Last Heart Rate in normal range    Pulse Readings from Last 1 Encounters:  07/08/22 68         Passed - Valid encounter within last 6 months    Recent Outpatient Visits           5 months ago Welcome to [employer] preventive visit   [place], Coralie Keens, NP   10 months ago Acute nasopharyngitis   [facility] [place], Coralie Keens, NP   1 year ago Flu-like symptoms   [facility] [place], Coralie Keens, NP   1 year ago Erroneous encounter - disregard   [facility] [place], Coralie Keens, NP   1 year ago Flu-like symptoms   [facility] [place], Coralie Keens, [place]

## 2022-10-20 ENCOUNTER — Other Ambulatory Visit: Payer: Self-pay | Admitting: Internal Medicine

## 2022-10-20 DIAGNOSIS — Z0000 Encounter for general adult medical examination without abnormal findings: Secondary | ICD-10-CM

## 2022-10-20 NOTE — Telephone Encounter (Signed)
Requested Prescriptions  Pending Prescriptions Disp Refills   albuterol (VENTOLIN HFA) 108 (90 Base) MCG/ACT inhaler [Pharmacy Med Name: ALBUTEROL HFA (PROAIR) INHALER] 8.5 each 0    Sig: INHALE 1-2 PUFFS INTO THE LUNGS EVERY 6 HOURS AS NEEDED FOR WHEEZING OR SHORTNESS OF BREATH.     Pulmonology:  Beta Agonists 2 Passed - 10/20/2022  9:06 AM      Passed - Last BP in normal range    BP Readings from Last 1 Encounters:  07/08/22 136/87         Passed - Last Heart Rate in normal range    Pulse Readings from Last 1 Encounters:  07/08/22 68         Passed - Valid encounter within last 12 months    Recent Outpatient Visits           5 months ago Welcome to [employer] preventive visit   [facility] [place], Coralie Keens, NP   10 months ago Acute nasopharyngitis   [facility] [place], Coralie Keens, NP   1 year ago Flu-like symptoms   [facility] [place], Coralie Keens, NP   1 year ago Erroneous encounter - disregard   [facility] [place], Coralie Keens, NP   1 year ago Flu-like symptoms   [facility] [place], Coralie Keens, [place]

## 2022-11-15 NOTE — Progress Notes (Unsigned)
Referring Physician:  Evon Slack, PA-C [address] [place],  [place] [address]  Primary Physician:  Lorre Munroe, NP  History of Present Illness: 11/16/2022 [name] is here today with a chief complaint of  right low back pain that radiates down the right leg, along with numbness and tingling in his leg as well.  His back and leg pain has been worsening over the past 4 months.  His pain is as bad as 10 out of 10.  It is made worse by standing and activity.  Nothing really helps.  His pain is down his right leg into his right foot.  He tried physical therapy without improvement.   Bowel/Bladder Dysfunction: none  Conservative measures:  Physical therapy:  has participated in at [facility] on 10/31/22 and 11/07/22 and then was put on hold due to poor tolerance of therapy. Multimodal medical therapy including regular antiinflammatories:  prednisone, naproxen, oxycodone, hydrocodone Injections:  has received epidural steroid injections 09/08/2022: Right L5-S1 and right S1 transforaminal ESI   Past Surgery: denies   Bryan Ibarra. has no symptoms of cervical myelopathy.  The symptoms are causing a significant impact on the patient's life.   I have utilized the care everywhere function in epic to review the outside records available from external health systems.  Review of Systems:  A 10 point review of systems is negative, except for the pertinent positives and negatives detailed in the HPI.  Past Medical History: Past Medical History:  Diagnosis Date   Alcohol abuse    a. up to a 12 pack of beer/day.   Arthritis    CAD in native artery    a. LHC 6/18: severe 1 vessel CAD with 90% thrombotic stenosis in the proximal LAD which was felt to be the culprit for the patient's non-STEMI. He underwent successful PCI/DES with a resolute onyx drug-eluting stent (3.0 x 18 mm). There was 0% residual stenosis. LVEF estimated at 55-65% by visual estimate. LVEDP was  mildly elevated   H/O echocardiogram    a. echo 6/18: EF 60-65%, normal wall motion, grade 1 diastolic dysfunction, RV cavity size was normal with normal wall thickness and normal RV systolic function. There were no significant valvular abnormalities   Hearing loss    Heart attack    Hypertension    Marijuana abuse    a. occasional.   Tobacco abuse     Past Surgical History: Past Surgical History:  Procedure Laterality Date   APPENDECTOMY     CORONARY STENT INTERVENTION N/A 01/01/2017   Procedure: Coronary Stent Intervention;  Surgeon: Iran Ouch, MD;  Location: [facility] INVASIVE CV LAB;  Service: Cardiovascular;  Laterality: N/A;   KNEE SURGERY     LEFT HEART CATH AND CORONARY ANGIOGRAPHY N/A 01/01/2017   Procedure: Left Heart Cath and Coronary Angiography;  Surgeon: Iran Ouch, MD;  Location: [facility] INVASIVE CV LAB;  Service: Cardiovascular;  Laterality: N/A;   NASAL ENDOSCOPY N/A 08/02/2019   Procedure: NASAL ENDOSCOPY;  Surgeon: Tamela Gammon, MD;  Location: [facility] ORS;  Service: ENT;  Laterality: N/A;   NASAL HEMORRHAGE CONTROL N/A 08/02/2019   Procedure: EPISTAXIS CONTROL;  Surgeon: Tamela Gammon, MD;  Location: [facility] ORS;  Service: ENT;  Laterality: N/A;   shunt heart attack      Allergies: Allergies as of 11/16/2022   (No Known Allergies)    Medications:  Current Outpatient Medications:    albuterol (VENTOLIN HFA) 108 (90 Base)  MCG/ACT inhaler, INHALE 1-2 PUFFS INTO THE LUNGS EVERY 6 HOURS AS NEEDED FOR WHEEZING OR SHORTNESS OF BREATH., Disp: 8.5 each, Rfl: 0   amLODipine (NORVASC) 5 MG tablet, Take 1 tablet (5 mg total) by mouth daily., Disp: 90 tablet, Rfl: 1   aspirin EC 81 MG tablet, Take 1 tablet (81 mg total) by mouth daily. Hold for 3 days., Disp: 30 tablet, Rfl: 0   Budeson-Glycopyrrol-Formoterol (BREZTRI AEROSPHERE) 160-9-4.8 MCG/ACT AERO, Inhale 2 puffs into the lungs 2 (two) times daily., Disp: 10.7 g, Rfl: 11   carvedilol (COREG) 6.25 MG tablet, TAKE 1  TABLET(6.25 MG) BY MOUTH TWICE DAILY WITH A MEAL, Disp: 180 tablet, Rfl: 1   cyclobenzaprine (FLEXERIL) 10 MG tablet, 10 mg 2 (two) times daily., Disp: , Rfl:    famotidine (PEPCID) 20 MG tablet, Take 1 tablet (20 mg total) by mouth 2 (two) times daily., Disp: 60 tablet, Rfl: 0   gabapentin (NEURONTIN) 300 MG capsule, Take 300 mg by mouth 2 (two) times daily., Disp: , Rfl:    lisinopril (ZESTRIL) 40 MG tablet, TAKE 1 TABLET(40 MG) BY MOUTH DAILY, Disp: 90 tablet, Rfl: 1   Melatonin 10 MG TABS, Take 2 tablets by mouth daily., Disp: , Rfl:    montelukast (SINGULAIR) 10 MG tablet, Take 1 tablet (10 mg total) by mouth at bedtime., Disp: 90 tablet, Rfl: 1   naproxen (NAPROSYN) 500 MG tablet, Take 1 tablet (500 mg total) by mouth 2 (two) times daily with a meal., Disp: 20 tablet, Rfl: 0   omeprazole (PRILOSEC) 40 MG capsule, TAKE 1 CAPSULE (40 MG TOTAL) BY MOUTH DAILY., Disp: 90 capsule, Rfl: 1   oxyCODONE-acetaminophen (PERCOCET) 5-325 MG tablet, Take 1 tablet by mouth every 6 (six) hours as needed for severe pain., Disp: 8 tablet, Rfl: 0   predniSONE (STERAPRED UNI-PAK 21 TAB) 10 MG (21) TBPK tablet, 6 tablets on day 1, then 5 tablets on day 2, then 4 tablets on day 3, then 3 tablets on day 4, then 2 tablets on day 5, then 1 tablet on day 6., Disp: 21 tablet, Rfl: 0   rosuvastatin (CRESTOR) 10 MG tablet, TAKE 1 TABLET(10 MG) BY MOUTH DAILY, Disp: 90 tablet, Rfl: 1   sildenafil (REVATIO) 20 MG tablet, TAKE 1 TO 5 TABLETS BY MOUTH ABOUT 30 MINUTES PRIOR TO SEX. START WITH 1 AND INCREASE AS NEEDED, Disp: 90 tablet, Rfl: 0  Social History: Social History   Tobacco Use   Smoking status: Some Days    Packs/day: 0.25    Years: 42.00    Additional pack years: 0.00    Total pack years: 10.50    Types: Cigarettes   Smokeless tobacco: Never   Tobacco comments:    smokes 5 cigarettes daily.   Vaping Use   Vaping Use: Never used  Substance Use Topics   Alcohol use: Yes    Comment: 12 beers per week    Drug use: Not Currently    Types: Marijuana    Comment: occasional MJ.  Remote cocaine.    Family Medical History: Family History  Problem Relation Age of Onset   Osteoporosis Mother    CAD Father        s/p CABG in his 13's or 4's.   Diabetes Father    Colon polyps Brother    CAD Paternal Grandfather     Physical Examination: Vitals:   11/16/22 1335  BP: 124/74  Pulse: 85  SpO2: 95%    General: Patient is well  developed, well nourished, calm, collected, and in no apparent distress. Attention to examination is appropriate.  Neck:   Supple.  Full range of motion.  Respiratory: Patient is breathing without any difficulty.   NEUROLOGICAL:     Awake, alert, oriented to person, place, and time.  Speech is clear and fluent.   Cranial Nerves: Pupils equal round and reactive to light.  Facial tone is symmetric.  Facial sensation is symmetric. Shoulder shrug is symmetric. Tongue protrusion is midline.  There is no pronator drift.  ROM of spine: full.    Strength: Side Biceps Triceps Deltoid Interossei Grip Wrist Ext. Wrist Flex.  R L Side Iliopsoas Quads Hamstring PF DF EHL  R L Reflexes are 1+ and symmetric at the biceps, triceps, brachioradialis, patella and achilles.   Hoffman's is absent.   Bilateral upper and lower extremity sensation is intact to light touch.    No evidence of dysmetria noted.  Gait is antalgic.  +SLR on R.     Medical Decision Making  Imaging: MRI L spine 08/17/2022 L1-2: Left eccentric disc bulging and moderate facet and ligamentum flavum hypertrophy result in borderline to mild left lateral recess and left neural foraminal stenosis without significant spinal stenosis.   L2-3: Disc bulging and moderate facet and ligamentum flavum hypertrophy without significant stenosis.   L3-4: Disc bulging, prominent dorsal epidural fat, and moderate facet and ligamentum flavum hypertrophy  result in borderline to mild spinal stenosis and mild bilateral neural foraminal stenosis.   L4-5: Disc bulging, a broad calcified central disc protrusion, prominent dorsal epidural fat, and moderate facet and ligamentum flavum hypertrophy result in mild-to-moderate spinal stenosis, mild-to-moderate bilateral lateral recess stenosis, and mild right greater than left neural foraminal stenosis. Potential bilateral L5 nerve root impingement.   L5-S1: Disc bulging, a right foraminal to extraforaminal disc extrusion, and moderate right and mild left facet hypertrophy result in mild bilateral neural foraminal stenosis without spinal stenosis. The disc extrusion may result in right L5 nerve impingement.   The above described degenerative changes are overall similar in appearance to the prior CT.   IMPRESSION: 1. Right foraminal/extraforaminal disc extrusion at L5-S1 with potential right L5 nerve root impingement. 2. 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 3. Mild spinal stenosis at L3-4.     Electronically Signed   By: Sebastian Ache M.D.   On: 08/17/2022 15:25  I have personally reviewed the images and agree with the above interpretation.  Assessment and Plan: Mr. Saccente is a pleasant 66 y.o. male with low back pain with right-sided sciatica.  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  At L4-5, he has disc height collapse and a disc protrusion that results in lateral recess stenosis.  I recommended L4-5 lateral lumbar interbody fusion with right-sided far lateral discectomy to address his L5 nerve root impingement.  We discussed that he will need to quit smoking.  If he quits today, he can get tested in mid May and we will proceed with his surgery at the end of May.    I discussed the planned procedure at length with the patient, including the risks, benefits, alternatives, and indications. The risks discussed include but are not  limited to bleeding, infection, need for reoperation, spinal fluid leak, stroke, vision loss,  anesthetic complication, coma, paralysis, and even death. I also described the possibility of psoas weakness and paresthesias. I described in detail that improvement was not guaranteed.  The patient expressed understanding of these risks, and asked that we proceed with surgery. I described the surgery in layman's terms, and gave ample opportunity for questions, which were answered to the best of my ability.   Thank you for involving me in the care of this patient.       K. Myer Haff MD, [facility] Neurosurgery

## 2022-11-16 ENCOUNTER — Other Ambulatory Visit: Payer: Self-pay

## 2022-11-16 ENCOUNTER — Other Ambulatory Visit: Payer: Self-pay | Admitting: Internal Medicine

## 2022-11-16 ENCOUNTER — Encounter: Payer: Self-pay | Admitting: Neurosurgery

## 2022-11-16 ENCOUNTER — Ambulatory Visit (INDEPENDENT_AMBULATORY_CARE_PROVIDER_SITE_OTHER): Payer: Medicare Other | Admitting: Neurosurgery

## 2022-11-16 ENCOUNTER — Other Ambulatory Visit (HOSPITAL_COMMUNITY): Payer: Self-pay

## 2022-11-16 VITALS — BP 124/74 | HR 85 | Ht 74.0 in | Wt 268.8 lb

## 2022-11-16 DIAGNOSIS — G8929 Other chronic pain: Secondary | ICD-10-CM | POA: Diagnosis not present

## 2022-11-16 DIAGNOSIS — M5116 Intervertebral disc disorders with radiculopathy, lumbar region: Secondary | ICD-10-CM | POA: Diagnosis not present

## 2022-11-16 DIAGNOSIS — M5416 Radiculopathy, lumbar region: Secondary | ICD-10-CM

## 2022-11-16 MED ORDER — CYCLOBENZAPRINE HCL 10 MG PO TABS
10.0000 mg | ORAL_TABLET | Freq: Two times a day (BID) | ORAL | 0 refills | Status: DC
  Filled 2022-11-16 – 2022-11-22 (×2): qty 30, 15d supply, fill #0

## 2022-11-16 MED ORDER — GABAPENTIN 300 MG PO CAPS
300.0000 mg | ORAL_CAPSULE | Freq: Two times a day (BID) | ORAL | 0 refills | Status: DC
  Filled 2022-11-16 – 2022-11-25 (×2): qty 60, 30d supply, fill #0

## 2022-11-16 NOTE — Patient Instructions (Signed)
Please see below for information in regards to your upcoming surgery:   Planned surgery: L4-5 lateral lumbar interbody fusion and posterior fusion; Right L5-S1 far lateral discectomy   Surgery date: 12/27/22 - you will find out your arrival time the business day before your surgery.   Pre-op appointment at [facility] Pre-admit Testing: we will call you with a date/time for this. Pre-admit testing is located on the first floor of the [facility], [address] [facility] [address], [address]. Please bring all prescriptions in the original prescription bottles to your appointment, even if you have reviewed medications by phone with a pharmacy representative. Pre-op labs may be done at your pre-op appointment. You are not required to fast for these labs. Should you need to change your pre-op appointment, please call Pre-admit testing at [phone].    Surgical clearance: we will send a form to Dr Kirke Corin    Blood thinners: ok to stay on aspirin      NSAIDS (Non-steroidal anti-inflammatory drugs): because you are having a fusion, no NSAIDS (such as ibuprofen, aleve, naproxen, meloxicam, diclofenac) for 3 months after surgery. Celebrex is an exception. Tylenol is ok because it is not an NSAID.     Because you are having a fusion: for appointments after your 2 week follow-up: please arrive at the [facility] outpatient imaging center ([address], [address], [place]) or [facility] one hour prior to your appointment for x-rays. This applies to every appointment after your 2 week follow-up. Failure to do so may result in your appointment being rescheduled.    We can be reached by phone or mychart 8am-4pm, Monday-Friday. If you have any questions/concerns before or after surgery, you can reach [place] at [phone], or you can send a mychart message. If you have a concern after hours that cannot wait until normal business hours, you can call [phone] and  ask to page the neurosurgeon on call for [place].     Appointments/FMLA & disability paperwork: [employer] Crist[employer]  Nurse: Royston Cowper  Medical assistants: Laurann Montana Physician Assistant's: Manning Charity & Drake Leach Surgeon: Venetia Night, MD

## 2022-11-17 ENCOUNTER — Telehealth: Payer: Self-pay | Admitting: Cardiovascular Disease

## 2022-11-17 ENCOUNTER — Telehealth: Payer: Self-pay

## 2022-11-17 NOTE — Telephone Encounter (Signed)
   Pre-operative Risk Assessment    Patient Name: Bryan Ibarra.  DOB: 1955-09-11 MRN: [id number]{      Request for Surgical Clearance{    Procedure:   L4-5 LATERAL LUMBAR INTERBODY FUSION & POSTERIOR SPINAL FUSION, RIGHT L5-51 FOR LATERAL DISCECTOMY  Date of Surgery:  Clearance 12/27/22                             {  Surgeon:  DR Venetia Night Surgeon's Group or Practice Name:  [facility] NEUROSURGERY Phone number:  [phone] Fax number:  [phone]  Type of Clearance Requested:   - Pharmacy:  Hold Aspirin ON TO STAY ON?  Type of Anesthesia:  General  { sts/questions:    Jorja Loa   11/17/2022, 10:23 AM

## 2022-11-17 NOTE — Telephone Encounter (Signed)
Primary Cardiologist:MuhaKirke Corinad Arida, MD   Preoperative team, please contact this patient and set up a phone call appointment for further preoperative risk assessment. Please obtain consent and complete medication review. Thank you for your help.   Per office protocol and pending no s/s of ACS at time of virtual visit, he may hold aspirin for 5-7 days prior to procedure and should resume as soon as hemodynamically stable postoperatively.   Levi Aland, NP-C  11/17/2022, 11:23 AM [address], [address] Office [phone] Fax [phone]

## 2022-11-17 NOTE — Telephone Encounter (Signed)
  Patient Consent for Virtual Visit         Bryan Ibarra Bryan Ibarra verbal consent on 11/17/2022 for a virtual visit (video or telephone).   CONSENT FOR VIRTUAL VISIT FOR:  Bryan Ibarra.  By participating in this virtual visit I agree to the following:  I hereby voluntarily request, consent and authorize [facility] and its employed or contracted physicians, physician assistants, nurse practitioners or other licensed health care professionals (the Practitioner), to provide me with telemedicine health care services (the "Services") as deemed necessary by the treating Practitioner. I acknowledge and consent to receive the Services by the Practitioner via telemedicine. I understand that the telemedicine visit will involve communicating with the Practitioner through live audiovisual communication technology and the disclosure of certain medical information by electronic transmission. I acknowledge that I have been given the opportunity to request an in-person assessment or other available alternative prior to the telemedicine visit and am voluntarily participating in the telemedicine visit.  I understand that I have the right to withhold or withdraw my consent to the use of telemedicine in the course of my care at any time, without affecting my right to future care or treatment, and that the Practitioner or I may terminate the telemedicine visit at any time. I understand that I have the right to inspect all information obtained and/or recorded in the course of the telemedicine visit and may receive copies of available information for a reasonable fee.  I understand that some of the potential risks of receiving the Services via telemedicine include:  Delay or interruption in medical evaluation due to technological equipment failure or disruption; Information transmitted may not be sufficient (e.g. poor resolution of images) to allow for appropriate medical decision making by the  Practitioner; and/or  In rare instances, security protocols could fail, causing a breach of personal health information.  Furthermore, I acknowledge that it is my responsibility to provide information about my medical history, conditions and care that is complete and accurate to the best of my ability. I acknowledge that Practitioner's advice, recommendations, and/or decision may be based on factors not within their control, such as incomplete or inaccurate data provided by me or distortions of diagnostic images or specimens that may result from electronic transmissions. I understand that the practice of medicine is not an exact science and that Practitioner makes no warranties or guarantees regarding treatment outcomes. I acknowledge that a copy of this consent can be made available to me via my patient portal [facility] MyChart), or I can request a printed copy by calling the office of [facility].    I understand that my insurance will be billed for this visit.   I have read or had this consent read to me. I understand the contents of this consent, which adequately explains the benefits and risks of the Services being provided via telemedicine.  I have been provided ample opportunity to ask questions regarding this consent and the Services and have had my questions answered to my satisfaction. I give my informed consent for the services to be provided through the use of telemedicine in my medical care

## 2022-11-17 NOTE — Telephone Encounter (Signed)
Patient scheduled for tele visit on 12/11/22. Med rec and consent done

## 2022-11-20 ENCOUNTER — Other Ambulatory Visit: Payer: Self-pay

## 2022-11-20 DIAGNOSIS — Z01818 Encounter for other preprocedural examination: Secondary | ICD-10-CM

## 2022-11-22 ENCOUNTER — Other Ambulatory Visit (HOSPITAL_COMMUNITY): Payer: Self-pay

## 2022-11-22 ENCOUNTER — Other Ambulatory Visit: Payer: Self-pay

## 2022-11-23 ENCOUNTER — Other Ambulatory Visit: Payer: Self-pay | Admitting: Internal Medicine

## 2022-11-23 DIAGNOSIS — Z0000 Encounter for general adult medical examination without abnormal findings: Secondary | ICD-10-CM

## 2022-11-23 NOTE — Telephone Encounter (Signed)
Requested medication (s) are due for refill today:   Yes for both  Requested medication (s) are on the active medication list:   Yes for both  Future visit scheduled:   No   Had Welcome to Medicare annual visit 05/16/2022   Last ordered: Both 05/16/2022 #90, 1 refill  Returned because Cr. And potassium labs due.   Requested Prescriptions  Pending Prescriptions Disp Refills   rosuvastatin (CRESTOR) 10 MG tablet [Pharmacy Med Name: ROSUVASTATIN CALCIUM 10 MG TAB] 90 tablet 1    Sig: TAKE 1 TABLET BY MOUTH EVERY DAY     Cardiovascular:  Antilipid - Statins 2 Failed - 11/23/2022  1:49 AM      Failed - Lipid Panel in normal range within the last 12 months    Cholesterol  Date Value Ref Range Status  05/16/2022 123 <200 mg/dL Final   LDL Cholesterol (Calc)  Date Value Ref Range Status  05/16/2022 38 mg/dL (calc) Final    Comment:    Reference range: <100 . Desirable range <100 mg/dL for primary prevention;   <70 mg/dL for patients with CHD or diabetic patients  with > or = 2 CHD risk factors. Marland Kitchen LDL-C is now calculated using the Martin-Hopkins  calculation, which is a validated novel method providing  better accuracy than the Friedewald equation in the  estimation of LDL-C.  Horald Pollen et al. Lenox Ahr. 1610;960(45): 2061-2068  (http://education.[facility].com/faq/FAQ164)    Direct LDL  Date Value Ref Range Status  06/24/2018 <4 0 - 99 mg/dL Final    Comment:    (NOTE) Performed At: [facility] [address] [place], [place] [id number] Jolene Schimke MD NW:[id number]    HDL  Date Value Ref Range Status  05/16/2022 71 > OR = 40 mg/dL Final   Triglycerides  Date Value Ref Range Status  05/16/2022 68 <150 mg/dL Final         Passed - Cr in normal range and within 360 days    Creat  Date Value Ref Range Status  05/16/2022 0.87 0.70 - 1.35 mg/dL Final         Passed - Patient is not pregnant      Passed - Valid encounter within last 12 months     Recent Outpatient Visits           6 months ago Welcome to [employer] preventive visit   [place] [facility] [place], Salvadore Oxford, NP   12 months ago Acute nasopharyngitis   [place] [facility] [place], Salvadore Oxford, NP   1 year ago Flu-like symptoms   [place] [facility] [place], Salvadore Oxford, NP   1 year ago Erroneous encounter - disregard   [place] [facility] [place], Salvadore Oxford, NP   1 year ago Flu-like symptoms   [place] [facility] [place], [place], NP               lisinopril (ZESTRIL) 40 MG tablet [Pharmacy Med Name: LISINOPRIL 40 MG TABLET] 90 tablet 1    Sig: TAKE 1 TABLET BY MOUTH EVERY DAY     Cardiovascular:  ACE Inhibitors Failed - 11/23/2022  1:49 AM      Failed - Cr in normal range and within 180 days    Creat  Date Value Ref Range Status  05/16/2022 0.87 0.70 - 1.35 mg/dL Final         Failed - K in normal range and within  180 days    Potassium  Date Value Ref Range Status  05/16/2022 4.3 3.5 - 5.3 mmol/L Final         Failed - Valid encounter within last 6 months    Recent Outpatient Visits           6 months ago Welcome to [employer] preventive visit   [facility] [place], Salvadore Oxford, NP   12 months ago Acute nasopharyngitis   [place] [facility] [place], Salvadore Oxford, NP   1 year ago Flu-like symptoms   [place] [facility] [place], Salvadore Oxford, NP   1 year ago Erroneous encounter - disregard   [place] [facility] [place], Salvadore Oxford, NP   1 year ago Flu-like symptoms   [place] [facility] [place], Salvadore Oxford, [place]              Passed - Patient is not pregnant      Passed - Last BP in normal range    BP Readings from Last 1 Encounters:  11/16/22 124/74

## 2022-11-24 ENCOUNTER — Other Ambulatory Visit: Payer: Self-pay | Admitting: Internal Medicine

## 2022-11-24 DIAGNOSIS — Z0000 Encounter for general adult medical examination without abnormal findings: Secondary | ICD-10-CM

## 2022-11-24 NOTE — Telephone Encounter (Signed)
Unable to refill per protocol, appointment needed. Duplicate request.  Requested Prescriptions  Pending Prescriptions Disp Refills   rosuvastatin (CRESTOR) 10 MG tablet [Pharmacy Med Name: ROSUVASTATIN CALCIUM 10 MG TAB] 90 tablet 1    Sig: TAKE 1 TABLET BY MOUTH EVERY DAY     Cardiovascular:  Antilipid - Statins 2 Failed - 11/24/2022  1:38 AM      Failed - Lipid Panel in normal range within the last 12 months    Cholesterol  Date Value Ref Range Status  05/16/2022 123 <200 mg/dL Final   LDL Cholesterol (Calc)  Date Value Ref Range Status  05/16/2022 38 mg/dL (calc) Final    Comment:    Reference range: <100 . Desirable range <100 mg/dL for primary prevention;   <70 mg/dL for patients with CHD or diabetic patients  with > or = 2 CHD risk factors. Marland Kitchen LDL-C is now calculated using the Martin-Hopkins  calculation, which is a validated novel method providing  better accuracy than the Friedewald equation in the  estimation of LDL-C.  Horald Pollen et al. Lenox Ahr. 0960;454(09): 2061-2068  (http://education.[facility].com/faq/FAQ164)    Direct LDL  Date Value Ref Range Status  06/24/2018 <4 0 - 99 mg/dL Final    Comment:    (NOTE) Performed At: [facility] [address] [place], [place] [id number] Jolene Schimke MD NF:[id number]    HDL  Date Value Ref Range Status  05/16/2022 71 > OR = 40 mg/dL Final   Triglycerides  Date Value Ref Range Status  05/16/2022 68 <150 mg/dL Final         Passed - Cr in normal range and within 360 days    Creat  Date Value Ref Range Status  05/16/2022 0.87 0.70 - 1.35 mg/dL Final         Passed - Patient is not pregnant      Passed - Valid encounter within last 12 months    Recent Outpatient Visits           6 months ago Welcome to [employer] preventive visit   [place] [facility] [place], Salvadore Oxford, NP   12 months ago Acute nasopharyngitis   [place] [facility] [place], Salvadore Oxford, NP   1  year ago Flu-like symptoms   [place] [facility] [place], Salvadore Oxford, NP   1 year ago Erroneous encounter - disregard   [place] [facility] [place], Salvadore Oxford, NP   1 year ago Flu-like symptoms   [place] [facility] [place], Salvadore Oxford, [place]

## 2022-11-25 ENCOUNTER — Other Ambulatory Visit: Payer: Self-pay | Admitting: Internal Medicine

## 2022-11-25 ENCOUNTER — Other Ambulatory Visit (HOSPITAL_COMMUNITY): Payer: Self-pay

## 2022-11-25 DIAGNOSIS — Z0000 Encounter for general adult medical examination without abnormal findings: Secondary | ICD-10-CM

## 2022-11-25 MED FILL — Carvedilol Tab 6.25 MG: ORAL | 90 days supply | Qty: 180 | Fill #0 | Status: CN

## 2022-11-25 MED FILL — Omeprazole Cap Delayed Release 40 MG: ORAL | 90 days supply | Qty: 90 | Fill #0 | Status: CN

## 2022-11-25 MED FILL — Budesonide-Glycopyrrolate-Formoterol Aers 160-9-4.8 MCG/ACT: RESPIRATORY_TRACT | 30 days supply | Qty: 10.7 | Fill #0 | Status: CN

## 2022-11-27 ENCOUNTER — Telehealth: Payer: Self-pay | Admitting: Internal Medicine

## 2022-11-27 NOTE — Telephone Encounter (Signed)
Pt is calling in because he would like some medication to help him stop smoking. 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 Pt says he left a message earlier and was checking on the status of that message. Please advise.

## 2022-11-28 ENCOUNTER — Ambulatory Visit (INDEPENDENT_AMBULATORY_CARE_PROVIDER_SITE_OTHER): Payer: Medicare Other | Admitting: Internal Medicine

## 2022-11-28 ENCOUNTER — Encounter: Payer: Self-pay | Admitting: Internal Medicine

## 2022-11-28 VITALS — BP 139/79 | HR 86 | Temp 96.8°F | Wt 271.0 lb

## 2022-11-28 DIAGNOSIS — G8929 Other chronic pain: Secondary | ICD-10-CM

## 2022-11-28 DIAGNOSIS — Z0000 Encounter for general adult medical examination without abnormal findings: Secondary | ICD-10-CM | POA: Diagnosis not present

## 2022-11-28 DIAGNOSIS — N522 Drug-induced erectile dysfunction: Secondary | ICD-10-CM

## 2022-11-28 DIAGNOSIS — R7309 Other abnormal glucose: Secondary | ICD-10-CM

## 2022-11-28 DIAGNOSIS — M5441 Lumbago with sciatica, right side: Secondary | ICD-10-CM

## 2022-11-28 DIAGNOSIS — I700 Atherosclerosis of aorta: Secondary | ICD-10-CM | POA: Insufficient documentation

## 2022-11-28 DIAGNOSIS — I2510 Atherosclerotic heart disease of native coronary artery without angina pectoris: Secondary | ICD-10-CM

## 2022-11-28 DIAGNOSIS — K219 Gastro-esophageal reflux disease without esophagitis: Secondary | ICD-10-CM

## 2022-11-28 DIAGNOSIS — I10 Essential (primary) hypertension: Secondary | ICD-10-CM

## 2022-11-28 DIAGNOSIS — I214 Non-ST elevation (NSTEMI) myocardial infarction: Secondary | ICD-10-CM

## 2022-11-28 DIAGNOSIS — Z6834 Body mass index (BMI) 34.0-34.9, adult: Secondary | ICD-10-CM

## 2022-11-28 DIAGNOSIS — E6609 Other obesity due to excess calories: Secondary | ICD-10-CM

## 2022-11-28 DIAGNOSIS — N529 Male erectile dysfunction, unspecified: Secondary | ICD-10-CM | POA: Insufficient documentation

## 2022-11-28 DIAGNOSIS — E782 Mixed hyperlipidemia: Secondary | ICD-10-CM | POA: Diagnosis not present

## 2022-11-28 DIAGNOSIS — J432 Centrilobular emphysema: Secondary | ICD-10-CM

## 2022-11-28 DIAGNOSIS — D692 Other nonthrombocytopenic purpura: Secondary | ICD-10-CM

## 2022-11-28 MED ORDER — ALBUTEROL SULFATE HFA 108 (90 BASE) MCG/ACT IN AERS: 2.0000 | INHALATION_SPRAY | Freq: Four times a day (QID) | RESPIRATORY_TRACT | 5 refills | Status: DC | PRN

## 2022-11-28 MED ORDER — VARENICLINE TARTRATE (STARTER) 0.5 MG X 11 & 1 MG X 42 PO TBPK: ORAL_TABLET | ORAL | 0 refills | Status: DC

## 2022-11-28 NOTE — Assessment & Plan Note (Signed)
c-Met and lipid profile today Continue rosuvastatin, aspirin, amlodipine, carvedilol and lisinopril

## 2022-11-28 NOTE — Progress Notes (Signed)
Subjective:    Patient ID: Bryan Ibarra., male    DOB: Nov 30, 1955, 67 y.o.   MRN: [id number]  HPI  Patient presents to clinic today for follow-up of chronic conditions.  HTN: His BP today is 148/82.  He is taking Amlodipine, Carvedilol and Lisinopril as prescribed.  ECG from 04/2022 reviewed.  HLD with CAD status post MI: His last LDL was 38, triglycerides 68, 04/2022.  He denies myalgias on Rosuvastatin.  He is taking Aspirin, Amlodipine, Carvedilol and Lisinopril as prescribed.  He follows with cardiology.  COPD: He denies chronic cough but has shortness of breath.  He is taking Breztri and Albuterol as prescribed.  There are no PFTs on file.  He does continue to smoke but would like medication for smoking cessation at this time.  GERD: Triggered by spicy foods.  He denies breakthrough on Omeprazole.  There is no upper GI on file.  ED: Managed with Sildenafil.  He does not follow with urology.  Chronic Back Pain: He is taking Oxycodone and Cyclobenzaprine as prescribed. He follows with neurosurgery.  Review of Systems     Past Medical History:  Diagnosis Date   Alcohol abuse    a. up to a 12 pack of beer/day.   Arthritis    CAD in native artery    a. LHC 6/18: severe 1 vessel CAD with 90% thrombotic stenosis in the proximal LAD which was felt to be the culprit for the patient's non-STEMI. He underwent successful PCI/DES with a resolute onyx drug-eluting stent (3.0 x 18 mm). There was 0% residual stenosis. LVEF estimated at 55-65% by visual estimate. LVEDP was mildly elevated   H/O echocardiogram    a. echo 6/18: EF 60-65%, normal wall motion, grade 1 diastolic dysfunction, RV cavity size was normal with normal wall thickness and normal RV systolic function. There were no significant valvular abnormalities   Hearing loss    Heart attack (HCC)    Hypertension    Marijuana abuse    a. occasional.   Tobacco abuse     Current Outpatient Medications  Medication Sig  Dispense Refill   albuterol (VENTOLIN HFA) 108 (90 Base) MCG/ACT inhaler INHALE 1-2 PUFFS INTO THE LUNGS EVERY 6 HOURS AS NEEDED FOR WHEEZING OR SHORTNESS OF BREATH. 8.5 each 0   amLODipine (NORVASC) 5 MG tablet Take 1 tablet (5 mg total) by mouth daily. 90 tablet 1   aspirin EC 81 MG tablet Take 1 tablet (81 mg total) by mouth daily. Hold for 3 days. 30 tablet 0   Budeson-Glycopyrrol-Formoterol (BREZTRI AEROSPHERE) 160-9-4.8 MCG/ACT AERO Inhale 2 puffs into the lungs 2 (two) times daily. 10.7 g 11   carvedilol (COREG) 6.25 MG tablet TAKE 1 TABLET(6.25 MG) BY MOUTH TWICE DAILY WITH A MEAL 180 tablet 1   cyclobenzaprine (FLEXERIL) 10 MG tablet Take 1 tablet (10 mg total) by mouth 2 (two) times daily. 30 tablet 0   famotidine (PEPCID) 20 MG tablet Take 1 tablet (20 mg total) by mouth 2 (two) times daily. 60 tablet 0   gabapentin (NEURONTIN) 300 MG capsule Take 1 capsule (300 mg total) by mouth 2 (two) times daily. 60 capsule 0   lisinopril (ZESTRIL) 40 MG tablet TAKE 1 TABLET(40 MG) BY MOUTH DAILY 90 tablet 1   Melatonin 10 MG TABS Take 2 tablets by mouth daily.     montelukast (SINGULAIR) 10 MG tablet Take 1 tablet (10 mg total) by mouth at bedtime. 90 tablet 1   naproxen (NAPROSYN) 500  MG tablet Take 1 tablet (500 mg total) by mouth 2 (two) times daily with a meal. 20 tablet 0   omeprazole (PRILOSEC) 40 MG capsule TAKE 1 CAPSULE (40 MG TOTAL) BY MOUTH DAILY. 90 capsule 1   oxyCODONE-acetaminophen (PERCOCET) 5-325 MG tablet Take 1 tablet by mouth every 6 (six) hours as needed for severe pain. 8 tablet 0   rosuvastatin (CRESTOR) 10 MG tablet TAKE 1 TABLET(10 MG) BY MOUTH DAILY 90 tablet 1   sildenafil (REVATIO) 20 MG tablet TAKE 1 TO 5 TABLETS BY MOUTH ABOUT 30 MINUTES PRIOR TO SEX. START WITH 1 AND INCREASE AS NEEDED 90 tablet 0   No current facility-administered medications for this visit.    No Known Allergies  Family History  Problem Relation Age of Onset   Osteoporosis Mother    CAD  Father        s/p CABG in his 17's or 69's.   Diabetes Father    Colon polyps Brother    CAD Paternal Grandfather     Social History   Socioeconomic History   Marital status: Divorced    Spouse name: Not on file   Number of children: Not on file   Years of education: Not on file   Highest education level: High school graduate  Occupational History   Not on file  Tobacco Use   Smoking status: Some Days    Packs/day: 0.25    Years: 42.00    Additional pack years: 0.00    Total pack years: 10.50    Types: Cigarettes   Smokeless tobacco: Never   Tobacco comments:    smokes 5 cigarettes daily.   Vaping Use   Vaping Use: Never used  Substance and Sexual Activity   Alcohol use: Yes    Comment: 12 beers per week   Drug use: Not Currently    Types: Marijuana    Comment: occasional MJ.  Remote cocaine.   Sexual activity: Yes  Other Topics Concern   Not on file  Social History Narrative   Lives in [place].  He lives in a mobile home in the backyard of his girlfriend.  He works in Aeronautical engineer.   Social Determinants of Health   Financial Resource Strain: Not on file  Food Insecurity: Not on file  Transportation Needs: Not on file  Physical Activity: Not on file  Stress: Not on file  Social Connections: Not on file  Intimate Partner Violence: Not on file     Constitutional: Denies fever, malaise, fatigue, headache or abrupt weight changes.  HEENT: Denies eye pain, eye redness, ear pain, ringing in the ears, wax buildup, runny nose, nasal congestion, bloody nose, or sore throat. Respiratory: Pt reports shortness of breath. Denies difficulty breathing, cough or sputum production.   Cardiovascular: Denies chest pain, chest tightness, palpitations or swelling in the hands or feet.  Gastrointestinal: Denies abdominal pain, bloating, constipation, diarrhea or blood in the stool.  GU: Patient reports erectile dysfunction.  Denies urgency, frequency, pain with urination, burning  sensation, blood in urine, odor or discharge. Musculoskeletal: Pt reports chronic back pain. Denies decrease in range of motion, difficulty with gait, or joint swelling.  Skin: Denies redness, rashes, lesions or ulcercations.  Neurological: Pt reports paresthesia of right lower extremity. Denies dizziness, difficulty with memory, difficulty with speech or problems with balance and coordination.  Psych: Denies anxiety, depression, SI/HI.  No other specific complaints in a complete review of systems (except as listed in HPI above).  Objective:  Physical Exam   BP (!) 146/84 (BP Location: Right Arm, Patient Position: Sitting, Cuff Size: Large)   Pulse 86   Temp (!) 96.8 F (36 C) (Temporal)   Wt 271 lb (122.9 kg)   SpO2 97%   BMI 34.79 kg/m   Wt Readings from Last 3 Encounters:  11/16/22 268 lb 12.8 oz (121.9 kg)  07/08/22 240 lb (108.9 kg)  05/24/22 246 lb (111.6 kg)    General: Appears his stated age, obese, in NAD. Skin: Warm, dry and intact.  Senile purpura noted. HEENT: Head: normal shape and size; Eyes: sclera white, no icterus, conjunctiva pink, PERRLA and EOMs intact;  Cardiovascular: Normal rate and rhythm. S1,S2 noted.  No murmur, rubs or gallops noted. No JVD or BLE edema. No carotid bruits noted. Pulmonary/Chest: Normal effort and positive vesicular breath sounds. No respiratory distress. No wheezes, rales or ronchi noted.  Abdomen:  Normal bowel sounds. Musculoskeletal: He has difficulty getting from a sitting to a standing position.  Pain with palpation over the lumbar spine.  No difficulty with gait.  Neurological: Alert and oriented. Cranial nerves II-XII grossly intact. Coordination normal.  Psychiatric: Mood and affect normal. Behavior is normal. Judgment and thought content normal.     BMET    Component Value Date/Time   NA 138 05/16/2022 0859   NA 140 10/04/2021 1459   K 4.3 05/16/2022 0859   CL 104 05/16/2022 0859   CO2 25 05/16/2022 0859   GLUCOSE 94  05/16/2022 0859   BUN 13 05/16/2022 0859   BUN 14 10/04/2021 1459   CREATININE 0.87 05/16/2022 0859   CALCIUM 9.3 05/16/2022 0859   GFRNONAA >60 10/27/2021 1228   GFRNONAA 69 01/30/2020 1115   GFRAA 80 01/30/2020 1115    Lipid Panel     Component Value Date/Time   CHOL 123 05/16/2022 0859   TRIG 68 05/16/2022 0859   HDL 71 05/16/2022 0859   CHOLHDL 1.7 05/16/2022 0859   VLDL 12 06/24/2018 1725   LDLCALC 38 05/16/2022 0859    CBC    Component Value Date/Time   WBC 6.4 05/16/2022 0859   RBC 5.13 05/16/2022 0859   HGB 17.0 05/16/2022 0859   HGB 16.7 10/04/2021 1459   HCT 48.0 05/16/2022 0859   HCT 49.2 10/04/2021 1459   PLT 247 05/16/2022 0859   PLT 272 10/04/2021 1459   MCV 93.6 05/16/2022 0859   MCV 92 10/04/2021 1459   MCH 33.1 (H) 05/16/2022 0859   MCHC 35.4 05/16/2022 0859   RDW 12.7 05/16/2022 0859   RDW 13.1 10/04/2021 1459   LYMPHSABS 2.0 10/04/2021 1459   MONOABS 0.5 08/02/2019 0703   EOSABS 0.1 10/04/2021 1459   BASOSABS 0.1 10/04/2021 1459    Hgb A1C Lab Results  Component Value Date   HGBA1C 5.5 05/16/2022           Assessment & Plan:     RTC in 6 months, follow-up chronic conditions Nicki Reaper, NP

## 2022-11-28 NOTE — Assessment & Plan Note (Signed)
He plans to have surgical intervention May 2024 by Dr. Marcell Barlow

## 2022-11-28 NOTE — Assessment & Plan Note (Signed)
C-Met and lipid profile today Encouraged to consume a low-fat diet Continue rosuvastatin

## 2022-11-28 NOTE — Assessment & Plan Note (Signed)
Continue sildenafil as needed. 

## 2022-11-28 NOTE — Assessment & Plan Note (Signed)
No angina  c-Met and lipid profile today Continue rosuvastatin, aspirin, amlodipine, carvedilol and lisinopril

## 2022-11-28 NOTE — Assessment & Plan Note (Signed)
Initially elevated but manual repeat proved Continue amlodipine, carvedilol and lisinopril Reinforced DASH diet and exercise for weight loss C-Met today

## 2022-11-28 NOTE — Assessment & Plan Note (Signed)
Continue Breztri and albuterol Rx for Chantix for smoking cessation

## 2022-11-28 NOTE — Assessment & Plan Note (Signed)
Encourage diet and exercise for weight loss 

## 2022-11-28 NOTE — Assessment & Plan Note (Signed)
c-Met and lipid profile today Continue rosuvastatin, aspirin, amlodipine, carvedilol and lisinopril 

## 2022-11-28 NOTE — Patient Instructions (Signed)

## 2022-11-28 NOTE — Assessment & Plan Note (Signed)
Avoid foods that trigger reflux Encouraged weight loss as this can produce reflux symptoms Continue omeprazole

## 2022-11-28 NOTE — Assessment & Plan Note (Signed)
CBC today On aspirin

## 2022-11-28 NOTE — Telephone Encounter (Signed)
Appointment made

## 2022-11-29 LAB — COMPLETE METABOLIC PANEL WITH GFR
AG Ratio: 2 (calc) (ref 1.0–2.5)
ALT: 15 U/L (ref 9–46)
AST: 15 U/L (ref 10–35)
Albumin: 4.3 g/dL (ref 3.6–5.1)
Alkaline phosphatase (APISO): 80 U/L (ref 35–144)
BUN: 16 mg/dL (ref 7–25)
CO2: 27 mmol/L (ref 20–32)
Calcium: 9.3 mg/dL (ref 8.6–10.3)
Chloride: 107 mmol/L (ref 98–110)
Creat: 1.08 mg/dL (ref 0.70–1.35)
Globulin: 2.2 g/dL (calc) (ref 1.9–3.7)
Glucose, Bld: 94 mg/dL (ref 65–139)
Potassium: 4.6 mmol/L (ref 3.5–5.3)
Sodium: 141 mmol/L (ref 135–146)
Total Bilirubin: 0.6 mg/dL (ref 0.2–1.2)
Total Protein: 6.5 g/dL (ref 6.1–8.1)
eGFR: 76 mL/min/{1.73_m2} (ref >=60)

## 2022-11-29 LAB — CBC
HCT: 45.4 % (ref 38.5–50.0)
Hemoglobin: 15.5 g/dL (ref 13.2–17.1)
MCH: 30.9 pg (ref 27.0–33.0)
MCHC: 34.1 g/dL (ref 32.0–36.0)
MCV: 90.6 fL (ref 80.0–100.0)
MPV: 9 fL (ref 7.5–12.5)
Platelets: 253 10*3/uL (ref 140–400)
RBC: 5.01 10*6/uL (ref 4.20–5.80)
RDW: 12.6 % (ref 11.0–15.0)
WBC: 7.5 10*3/uL (ref 3.8–10.8)

## 2022-11-29 LAB — LIPID PANEL
Cholesterol: 135 mg/dL (ref <200)
HDL: 67 mg/dL (ref >=40)
LDL Cholesterol (Calc): 42 mg/dL (calc)
Non-HDL Cholesterol (Calc): 68 mg/dL (calc) (ref <130)
Total CHOL/HDL Ratio: 2 (calc) (ref <5.0)
Triglycerides: 181 mg/dL — ABNORMAL HIGH (ref <150)

## 2022-11-29 LAB — HEMOGLOBIN A1C
Hgb A1c MFr Bld: 5.5 % of total Hgb (ref <5.7)
Mean Plasma Glucose: 111 mg/dL
eAG (mmol/L): 6.2 mmol/L

## 2022-12-04 ENCOUNTER — Telehealth: Payer: Self-pay | Admitting: Neurosurgery

## 2022-12-04 NOTE — Telephone Encounter (Signed)
L4-5 XLIF/PSF, Right L5-S1 far lateral discectomy on 12/27/22  Patient wants to postpone his surgery to mid or late July due to some personal family issues. He would like to go ahead and get a new date put on the book. Please call him when you get the July schedule.

## 2022-12-04 NOTE — Telephone Encounter (Signed)
Attempted to contact Bryan Ibarra, but was told he was not home at the moment. I will send him a mychart message with date options.

## 2022-12-05 NOTE — Telephone Encounter (Signed)
Virtual visit rescheduled to June.

## 2022-12-05 NOTE — Telephone Encounter (Signed)
Fax states date of procedure changed to 02/21/23

## 2022-12-10 ENCOUNTER — Other Ambulatory Visit: Payer: Self-pay | Admitting: Internal Medicine

## 2022-12-10 DIAGNOSIS — Z0000 Encounter for general adult medical examination without abnormal findings: Secondary | ICD-10-CM

## 2022-12-11 ENCOUNTER — Telehealth: Payer: Medicare Other

## 2022-12-11 NOTE — Telephone Encounter (Signed)
Requested Prescriptions  Pending Prescriptions Disp Refills   amLODipine (NORVASC) 5 MG tablet [Pharmacy Med Name: AMLODIPINE BESYLATE 5 MG TAB] 90 tablet 1    Sig: TAKE 1 TABLET (5 MG TOTAL) BY MOUTH DAILY.     Cardiovascular: Calcium Channel Blockers 2 Passed - 12/10/2022  5:52 AM      Passed - Last BP in normal range    BP Readings from Last 1 Encounters:  11/28/22 139/79         Passed - Last Heart Rate in normal range    Pulse Readings from Last 1 Encounters:  11/28/22 86         Passed - Valid encounter within last 6 months    Recent Outpatient Visits           1 week ago Mixed hyperlipidemia   [place] [facility] [place], Salvadore Oxford, NP   6 months ago [facility] [employer] preventive visit   [place] [place], Salvadore Oxford, NP   1 year ago Acute nasopharyngitis   [place] [facility] [place], Salvadore Oxford, NP   1 year ago Flu-like symptoms   [place] [facility] [place], Salvadore Oxford, NP   1 year ago Erroneous encounter - disregard   [place] [facility] [place], Salvadore Oxford, NP       Future Appointments             In 5 months Baity, Salvadore Oxford, NP  [facility], [facility]

## 2022-12-15 ENCOUNTER — Other Ambulatory Visit: Payer: Medicare Other

## 2022-12-16 ENCOUNTER — Other Ambulatory Visit: Payer: Self-pay | Admitting: Internal Medicine

## 2022-12-16 DIAGNOSIS — Z0000 Encounter for general adult medical examination without abnormal findings: Secondary | ICD-10-CM

## 2022-12-18 ENCOUNTER — Other Ambulatory Visit: Payer: Self-pay | Admitting: Internal Medicine

## 2022-12-18 NOTE — Telephone Encounter (Signed)
Requested Prescriptions  Pending Prescriptions Disp Refills   rosuvastatin (CRESTOR) 10 MG tablet [Pharmacy Med Name: ROSUVASTATIN CALCIUM 10 MG TAB] 90 tablet 1    Sig: TAKE 1 TABLET BY MOUTH EVERY DAY     Cardiovascular:  Antilipid - Statins 2 Failed - 12/16/2022  7:32 AM      Failed - Lipid Panel in normal range within the last 12 months    Cholesterol  Date Value Ref Range Status  11/28/2022 135 <200 mg/dL Final   LDL Cholesterol (Calc)  Date Value Ref Range Status  11/28/2022 42 mg/dL (calc) Final    Comment:    Reference range: <100 . Desirable range <100 mg/dL for primary prevention;   <70 mg/dL for patients with CHD or diabetic patients  with > or = 2 CHD risk factors. Marland Kitchen LDL-C is now calculated using the Martin-Hopkins  calculation, which is a validated novel method providing  better accuracy than the Friedewald equation in the  estimation of LDL-C.  Horald Pollen et al. Lenox Ahr. 4098;119(14): 2061-2068  (http://education.[employer].com/faq/FAQ164)    Direct LDL  Date Value Ref Range Status  06/24/2018 <4 0 - 99 mg/dL Final    Comment:    (NOTE) Performed At: [facility] [address] [place], [place] [id number] Jolene Schimke MD YQ:[id number]    HDL  Date Value Ref Range Status  11/28/2022 67 > OR = 40 mg/dL Final   Triglycerides  Date Value Ref Range Status  11/28/2022 181 (H) <150 mg/dL Final         Passed - Cr in normal range and within 360 days    Creat  Date Value Ref Range Status  11/28/2022 1.08 0.70 - 1.35 mg/dL Final         Passed - Patient is not pregnant      Passed - Valid encounter within last 12 months    Recent Outpatient Visits           2 weeks ago Mixed hyperlipidemia   [place] [facility] [place], Salvadore Oxford, NP   7 months ago [facility] to [employer] preventive visit   [place] [facility] [place], Salvadore Oxford, NP   1 year ago Acute nasopharyngitis   [place] [facility] [place], Salvadore Oxford, NP   1 year ago Flu-like symptoms   [place] [facility] [place], Salvadore Oxford, NP   1 year ago Erroneous encounter - disregard   [place] [facility] [place], Salvadore Oxford, NP       Future Appointments             In 5 months Baity, Salvadore Oxford, NP [place] [facility], [facility]

## 2022-12-19 ENCOUNTER — Telehealth: Payer: Self-pay | Admitting: Cardiovascular Disease

## 2022-12-19 DIAGNOSIS — Z0000 Encounter for general adult medical examination without abnormal findings: Secondary | ICD-10-CM

## 2022-12-19 MED ORDER — LISINOPRIL 40 MG PO TABS: ORAL_TABLET | ORAL | 0 refills | Status: DC

## 2022-12-19 NOTE — Telephone Encounter (Signed)
PCP sent refill on Rosuvastatin yesterday.

## 2022-12-19 NOTE — Telephone Encounter (Signed)
Requested medication (s) are due for refill today: review  Requested medication (s) are on the active medication list: yes  Last refill:  11/28/22   Future visit scheduled: yes  Notes to clinic:  per protocol refill for 1mg , not starter pack. Please review for refill.      Requested Prescriptions  Pending Prescriptions Disp Refills   Varenicline Tartrate, Starter, 0.5 MG X 11 & 1 MG X 42 TBPK [Pharmacy Med Name: VARENICLINE STARTING MONTH BOX] 53 each 0    Sig: As directed     Psychiatry:  Drug Dependence Therapy - varenicline Failed - 12/18/2022  2:55 PM      Failed - Manual Review: Do not refill starter pack. 1 mg tabs may be extended up to one year if the patient has quit smoking but still feels at risk for relapse.      Passed - Cr in normal range and within 180 days    Creat  Date Value Ref Range Status  11/28/2022 1.08 0.70 - 1.35 mg/dL Final         Passed - Completed PHQ-2 or PHQ-9 in the last 360 days      Passed - Valid encounter within last 6 months    Recent Outpatient Visits           3 weeks ago Mixed hyperlipidemia   [place] [facility] [place], Salvadore Oxford, NP   7 months ago [facility] [employer] preventive visit   [place] [facility] [place], Salvadore Oxford, NP   1 year ago Acute nasopharyngitis   [place] [facility] [place] [facility], Salvadore Oxford, NP   1 year ago Flu-like symptoms   [place] [facility] [place], Salvadore Oxford, NP   1 year ago Erroneous encounter - disregard    [facility] [place], Salvadore Oxford, NP       Future Appointments             In 5 months Baity, Salvadore Oxford, NP  [facility], [facility]

## 2022-12-19 NOTE — Telephone Encounter (Signed)
Patient is overdue for 6 month f/u, please call to schedule.  Thank you!  Last visit 05/24/2022--You will need a follow up appointment in 6 months Next visit none

## 2022-12-19 NOTE — Telephone Encounter (Signed)
*  STAT* If patient is at the pharmacy, call can be transferred to refill team.   1. Which medications need to be refilled? (please list name of each medication and dose if known) Lisinopril and Rosuvastatin  2. Which pharmacy/location (including street and city if local pharmacy) is medication to be sent to? [facility] [place]  3. Do they need a 30 day or 90 day supply? 90 days and refills

## 2022-12-20 NOTE — Telephone Encounter (Signed)
Bryan Ibarra, please advise on medication refill and on f/u plan.  Thanks!

## 2022-12-21 NOTE — Telephone Encounter (Signed)
Medications have already been refilled.

## 2022-12-28 ENCOUNTER — Other Ambulatory Visit: Payer: Self-pay

## 2022-12-28 ENCOUNTER — Other Ambulatory Visit: Payer: Self-pay | Admitting: Cardiovascular Disease

## 2022-12-28 DIAGNOSIS — Z0000 Encounter for general adult medical examination without abnormal findings: Secondary | ICD-10-CM

## 2022-12-28 MED ORDER — LISINOPRIL 40 MG PO TABS
40.0000 mg | ORAL_TABLET | Freq: Every day | ORAL | 0 refills | Status: DC
  Filled 2022-12-28 – 2023-01-12 (×3): qty 90, 90d supply, fill #0

## 2023-01-01 ENCOUNTER — Ambulatory Visit: Payer: Medicare Other | Attending: Cardiology | Admitting: Student

## 2023-01-01 ENCOUNTER — Other Ambulatory Visit: Payer: Self-pay

## 2023-01-01 DIAGNOSIS — Z01810 Encounter for preprocedural cardiovascular examination: Secondary | ICD-10-CM

## 2023-01-01 NOTE — Progress Notes (Signed)
Virtual Visit via Telephone Note   Because of Bryan NITZSCHE Jr.'s co-morbid illnesses, he is at least at moderate risk for complications without adequate follow up.  This format is felt to be most appropriate for this patient at this time.  The patient did not have access to video technology/had technical difficulties with video requiring transitioning to audio format only (telephone).  All issues noted in this document were discussed and addressed.  No physical exam could be performed with this format.  Please refer to the patient's chart for his consent to telehealth for [facility].  Evaluation Performed:  Preoperative cardiovascular risk assessment _____________   Date:  01/01/2023   Patient ID:  Bryan Ibarra., DOB 08/06/1955, MRN [id number] Patient Location:  Home Provider location:   Office  Primary Care Provider:  Lorre Munroe, NP Primary Cardiologist:  Bryan Bears, MD  Chief Complaint / Patient Profile   67 y.o. y/o male with a h/o CAD s/p PCI with DES to proximal LAD, hypertension, polysubstance abuse, COPD who is pending L4-5 lateral lumbar interbody fusion and posterior spinal fusion, right L5-S1 lateral discectomy by Dr. Marcell Barlow and presents today for telephonic preoperative cardiovascular risk assessment.  History of Present Illness    Bryan Ibarra. is a 67 y.o. male who presents via audio/video conferencing for a telehealth visit today.  Pt was last seen in cardiology clinic on 05/24/2022 by Charlsie Quest, NP.  At that time Bryan Ibarra. was doing well from a cardiac standpoint.  The patient is now pending procedure as outlined above. Since his last visit, he is doing well. Patient reports shortness of breath and dyspnea on exertion are at baseline for him. No chest pain, pressure, or tightness. Denies orthopnea or PND. No palpitations. He reports chronic mild  lower extremity edema. His activity has been limited secondary to his back  pain. He used to be very active walking for exercise. 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 He also planted his own garden. He is independent with activities around his home and light to moderate housework.    Past Medical History    Past Medical History:  Diagnosis Date   Alcohol abuse    a. up to a 12 pack of beer/day.   Arthritis    CAD in native artery    a. LHC 6/18: severe 1 vessel CAD with 90% thrombotic stenosis in the proximal LAD which was felt to be the culprit for the patient's non-STEMI. He underwent successful PCI/DES with a resolute onyx drug-eluting stent (3.0 x 18 mm). There was 0% residual stenosis. LVEF estimated at 55-65% by visual estimate. LVEDP was mildly elevated   H/O echocardiogram    a. echo 6/18: EF 60-65%, normal wall motion, grade 1 diastolic dysfunction, RV cavity size was normal with normal wall thickness and normal RV systolic function. There were no significant valvular abnormalities   Hearing loss    Heart attack (HCC)    Hypertension    Marijuana abuse    a. occasional.   Tobacco abuse    Past Surgical History:  Procedure Laterality Date   APPENDECTOMY     CORONARY STENT INTERVENTION N/A 01/01/2017   Procedure: Coronary Stent Intervention;  Surgeon: Iran Ouch, MD;  Location: [facility] INVASIVE CV LAB;  Service: Cardiovascular;  Laterality: N/A;   KNEE SURGERY     LEFT HEART CATH AND CORONARY ANGIOGRAPHY N/A 01/01/2017   Procedure: Left Heart Cath and Coronary  Angiography;  Surgeon: Iran Ouch, MD;  Location: [facility] INVASIVE CV LAB;  Service: Cardiovascular;  Laterality: N/A;   NASAL ENDOSCOPY N/A 08/02/2019   Procedure: NASAL ENDOSCOPY;  Surgeon: Tamela Gammon, MD;  Location: [facility] ORS;  Service: ENT;  Laterality: N/A;   NASAL HEMORRHAGE CONTROL N/A 08/02/2019   Procedure: EPISTAXIS CONTROL;  Surgeon: Tamela Gammon, MD;  Location: [facility] ORS;  Service: ENT;  Laterality: N/A;   shunt heart attack      Allergies  No Known  Allergies  Home Medications    Prior to Admission medications   Medication Sig Start Date End Date Taking? Authorizing Provider  albuterol (VENTOLIN HFA) 108 (90 Base) MCG/ACT inhaler Inhale 2 puffs into the lungs every 6 (six) hours as needed for wheezing or shortness of breath. 11/28/22   Bryan Munroe, NP  amLODipine (NORVASC) 5 MG tablet TAKE 1 TABLET (5 MG TOTAL) BY MOUTH DAILY. 12/11/22   Bryan Munroe, NP  aspirin EC 81 MG tablet Take 1 tablet (81 mg total) by mouth daily. Hold for 3 days. 08/02/19   Ackall, Ardine Eng, MD  Budeson-Glycopyrrol-Formoterol (BREZTRI AEROSPHERE) 160-9-4.8 MCG/ACT AERO Inhale 2 puffs into the lungs 2 (two) times daily. 08/31/22   Bryan Munroe, NP  carvedilol (COREG) 6.25 MG tablet TAKE 1 TABLET(6.25 MG) BY MOUTH TWICE DAILY WITH A MEAL 10/18/22   Bryan Munroe, NP  cyclobenzaprine (FLEXERIL) 10 MG tablet Take 1 tablet (10 mg total) by mouth 2 (two) times daily. 11/16/22   Bryan Munroe, NP  gabapentin (NEURONTIN) 300 MG capsule Take 1 capsule (300 mg total) by mouth 2 (two) times daily. 11/16/22   Bryan Munroe, NP  lisinopril (ZESTRIL) 40 MG tablet Take 1 tablet (40 mg total) by mouth daily.Please contact office to schedule follow up appointment prior next refill 12/28/22   Iran Ouch, MD  Melatonin 10 MG TABS Take 2 tablets by mouth daily.    [provider]  montelukast (SINGULAIR) 10 MG tablet Take 1 tablet (10 mg total) by mouth at bedtime. 05/16/22   Bryan Munroe, NP  naproxen (NAPROSYN) 500 MG tablet Take 1 tablet (500 mg total) by mouth 2 (two) times daily with a meal. 07/08/22   Sharman Cheek, MD  omeprazole (PRILOSEC) 40 MG capsule TAKE 1 CAPSULE (40 MG TOTAL) BY MOUTH DAILY. 10/18/22   Bryan Munroe, NP  oxyCODONE-acetaminophen (PERCOCET) 5-325 MG tablet Take 1 tablet by mouth every 6 (six) hours as needed for severe pain. 07/08/22 07/08/23  Sharman Cheek, MD  rosuvastatin (CRESTOR) 10 MG tablet TAKE 1 TABLET BY MOUTH EVERY  DAY 12/18/22   Bryan Munroe, NP  sildenafil (REVATIO) 20 MG tablet TAKE 1 TO 5 TABLETS BY MOUTH ABOUT 30 MINUTES PRIOR TO SEX. START WITH 1 AND INCREASE AS NEEDED 05/09/22   Bryan Munroe, NP  varenicline (CHANTIX) 1 MG tablet Take 1 tablet (1 mg total) by mouth 2 (two) times daily. 12/19/22   Bryan Munroe, NP    Physical Exam    Vital Signs:  Corri Blauer. does not have vital signs available for review today.  Given telephonic nature of communication, physical exam is limited. AAOx3. NAD. Normal affect.  Speech and respirations are unlabored.  Accessory Clinical Findings    None  Assessment & Plan    Primary Cardiologist: Bryan Bears, MD  Preoperative cardiovascular risk assessment.  L4-5 lateral lumbar interbody fusion and posterior spinal fusion, right L5-S1 lateral discectomy  Chart reviewed as part of pre-operative protocol coverage. According to the RCRI, patient has a 0.9% risk of MACE. Patient reports activity equivalent to >4.0 METS (landscaping and gardening, light to moderate household activities).   Given past medical history and time since last visit, based on ACC/AHA guidelines, Danik Lanyon. would be at acceptable risk for the planned procedure without further cardiovascular testing.   Patient was advised that if he develops new symptoms prior to surgery to contact our office to arrange a follow-up appointment.  he verbalized understanding.  Ideally aspirin should be continued without interruption, however if the bleeding risk is too great, aspirin may be held for 5-7 days prior to surgery. Please resume aspirin post operatively when it is felt to be safe from a bleeding standpoint.    I will route this recommendation to the requesting party via Epic fax function.  Please call with questions.  Time:   Today, I have spent 6 minutes with the patient with telehealth technology discussing medical history, symptoms, and management plan.     Carlos Levering, NP  01/01/2023, 7:38 AM

## 2023-01-04 ENCOUNTER — Other Ambulatory Visit: Payer: Self-pay | Admitting: Internal Medicine

## 2023-01-04 NOTE — Telephone Encounter (Signed)
Rx 12/19/22 #60- within 2 week period- will RF Requested Prescriptions  Pending Prescriptions Disp Refills   varenicline (CHANTIX) 1 MG tablet [Pharmacy Med Name: VARENICLINE 1 MG TABLET] 60 tablet 0    Sig: TAKE 1 TABLET BY MOUTH TWICE A DAY     Psychiatry:  Drug Dependence Therapy - varenicline Failed - 01/04/2023 11:46 AM      Failed - Manual Review: Do not refill starter pack. 1 mg tabs may be extended up to one year if the patient has quit smoking but still feels at risk for relapse.      Passed - Cr in normal range and within 180 days    Creat  Date Value Ref Range Status  11/28/2022 1.08 0.70 - 1.35 mg/dL Final         Passed - Completed PHQ-2 or PHQ-9 in the last 360 days      Passed - Valid encounter within last 6 months    Recent Outpatient Visits           1 month ago Mixed hyperlipidemia   [facility] [place], Salvadore Oxford, NP   7 months ago [employer] preventive visit   [place] [facility] [place], Salvadore Oxford, NP   1 year ago Acute nasopharyngitis   [place] [facility] [place], Salvadore Oxford, NP   1 year ago Flu-like symptoms   [place] [facility] [place], Salvadore Oxford, NP   1 year ago Erroneous encounter - disregard   [place] [facility] [place], Salvadore Oxford, NP       Future Appointments             In 4 months Baity, Salvadore Oxford, NP [place] [facility], [facility]

## 2023-01-06 ENCOUNTER — Other Ambulatory Visit: Payer: Self-pay | Admitting: Cardiovascular Disease

## 2023-01-06 DIAGNOSIS — Z0000 Encounter for general adult medical examination without abnormal findings: Secondary | ICD-10-CM

## 2023-01-11 ENCOUNTER — Encounter: Payer: Medicare Other | Admitting: Neurosurgery

## 2023-01-12 ENCOUNTER — Other Ambulatory Visit: Payer: Self-pay | Admitting: Cardiovascular Disease

## 2023-01-12 ENCOUNTER — Other Ambulatory Visit: Payer: Self-pay | Admitting: Internal Medicine

## 2023-01-12 ENCOUNTER — Other Ambulatory Visit: Payer: Self-pay

## 2023-01-12 DIAGNOSIS — Z0000 Encounter for general adult medical examination without abnormal findings: Secondary | ICD-10-CM

## 2023-01-12 NOTE — Telephone Encounter (Signed)
Please contact pt for future appointment. Pt overdue for f/u. Pt needing refills. 

## 2023-01-12 NOTE — Telephone Encounter (Signed)
Requested medication (s) are due for refill today - yes  Requested medication (s) are on the active medication list -yes  Future visit scheduled -yes  Last refill: 11/16/22 #30  Notes to clinic: non delegated Rx  Requested Prescriptions  Pending Prescriptions Disp Refills   cyclobenzaprine (FLEXERIL) 10 MG tablet 30 tablet 0    Sig: Take 1 tablet (10 mg total) by mouth 2 (two) times daily.     Not Delegated - Analgesics:  Muscle Relaxants Failed - 01/12/2023  2:18 PM      Failed - This refill cannot be delegated      Passed - Valid encounter within last 6 months    Recent Outpatient Visits           1 month ago Mixed hyperlipidemia   [place] [facility] [place], Salvadore Oxford, NP   8 months ago [employer] to [employer] preventive visit   [place] [facility] [place], Salvadore Oxford, NP   1 year ago Acute nasopharyngitis   [facility] [place], Salvadore Oxford, NP   1 year ago Flu-like symptoms   [place] [place], Salvadore Oxford, NP   1 year ago Erroneous encounter - disregard   [facility] [place], Salvadore Oxford, NP       Future Appointments             In 4 months [place], Salvadore Oxford, NP [place] [facility], [facility]               Requested Prescriptions  Pending Prescriptions Disp Refills   cyclobenzaprine (FLEXERIL) 10 MG tablet 30 tablet 0    Sig: Take 1 tablet (10 mg total) by mouth 2 (two) times daily.     Not Delegated - Analgesics:  Muscle Relaxants Failed - 01/12/2023  2:18 PM      Failed - This refill cannot be delegated      Passed - Valid encounter within last 6 months    Recent Outpatient Visits           1 month ago Mixed hyperlipidemia   [place] [facility] [place], Salvadore Oxford, NP   8 months ago [employer] to [employer] preventive visit   [place] [facility] [place], Salvadore Oxford, NP   1 year ago Acute nasopharyngitis    [place] [facility] [place], Salvadore Oxford, NP   1 year ago Flu-like symptoms   [place] [place], Salvadore Oxford, NP   1 year ago Erroneous encounter - disregard    [facility] [place], Salvadore Oxford, NP       Future Appointments             In 4 months Baity, Salvadore Oxford, NP  [facility], [facility]

## 2023-01-15 ENCOUNTER — Other Ambulatory Visit (HOSPITAL_COMMUNITY): Payer: Self-pay

## 2023-01-15 ENCOUNTER — Other Ambulatory Visit: Payer: Self-pay | Admitting: Internal Medicine

## 2023-01-15 ENCOUNTER — Other Ambulatory Visit: Payer: Self-pay

## 2023-01-15 MED ORDER — CYCLOBENZAPRINE HCL 10 MG PO TABS
10.0000 mg | ORAL_TABLET | Freq: Two times a day (BID) | ORAL | 0 refills | Status: DC
  Filled 2023-01-15: qty 30, 15d supply, fill #0

## 2023-01-15 NOTE — Telephone Encounter (Signed)
Confirmed with pharmacy that 12/28/22 #90 refill received.  Pharmacy deleted request.

## 2023-01-16 ENCOUNTER — Other Ambulatory Visit: Payer: Self-pay

## 2023-01-16 ENCOUNTER — Other Ambulatory Visit: Payer: Self-pay | Admitting: Internal Medicine

## 2023-01-16 ENCOUNTER — Other Ambulatory Visit (HOSPITAL_COMMUNITY): Payer: Self-pay

## 2023-01-16 MED ORDER — CYCLOBENZAPRINE HCL 10 MG PO TABS
10.0000 mg | ORAL_TABLET | Freq: Two times a day (BID) | ORAL | 0 refills | Status: DC
  Filled 2023-01-16 – 2023-01-29 (×2): qty 60, 30d supply, fill #0
  Filled ????-??-??: fill #0

## 2023-01-16 NOTE — Telephone Encounter (Signed)
Requested medication (s) are due for refill today: yes for a 30 day supply  Requested medication (s) are on the active medication list: yes  Last refill:  01/15/23  Future visit scheduled: yes  Notes to clinic:  Patient comment: Need a 30 day supply , only 15 days supply was given, can not delegate. Routing for approval.     Requested Prescriptions  Pending Prescriptions Disp Refills   cyclobenzaprine (FLEXERIL) 10 MG tablet 30 tablet 0    Sig: Take 1 tablet (10 mg total) by mouth 2 (two) times daily.     Not Delegated - Analgesics:  Muscle Relaxants Failed - 01/15/2023  2:41 PM      Failed - This refill cannot be delegated      Passed - Valid encounter within last 6 months    Recent Outpatient Visits           1 month ago Mixed hyperlipidemia   [place] [facility] [place], Salvadore Oxford, NP   8 months ago [employer] to [employer] preventive visit   [place] [facility] [place], Salvadore Oxford, NP   1 year ago Acute nasopharyngitis   [place] [facility] [place], Salvadore Oxford, NP   1 year ago Flu-like symptoms   [place] [facility] [place], Salvadore Oxford, NP   1 year ago Erroneous encounter - disregard   [place] [facility] [place], Salvadore Oxford, NP       Future Appointments             In 4 months Baity, Salvadore Oxford, NP [facility], [facility]

## 2023-01-16 NOTE — Telephone Encounter (Signed)
Last RF 01/04/23 #60  too soon-  Requested Prescriptions  Refused Prescriptions Disp Refills   varenicline (CHANTIX) 1 MG tablet [Pharmacy Med Name: VARENICLINE 1 MG TABLET] 180 tablet 1    Sig: TAKE 1 TABLET BY MOUTH TWICE A DAY     Psychiatry:  Drug Dependence Therapy - varenicline Failed - 01/16/2023  8:17 AM      Failed - Manual Review: Do not refill starter pack. 1 mg tabs may be extended up to one year if the patient has quit smoking but still feels at risk for relapse.      Passed - Cr in normal range and within 180 days    Creat  Date Value Ref Range Status  11/28/2022 1.08 0.70 - 1.35 mg/dL Final         Passed - Completed PHQ-2 or PHQ-9 in the last 360 days      Passed - Valid encounter within last 6 months    Recent Outpatient Visits           1 month ago Mixed hyperlipidemia   [place] [facility] [place], Salvadore Oxford, NP   8 months ago [facility] to [employer] preventive visit   [place] [facility] [place], Salvadore Oxford, NP   1 year ago Acute nasopharyngitis   [place] [facility] [place], Salvadore Oxford, NP   1 year ago Flu-like symptoms   [place] [facility] [place], Salvadore Oxford, NP   1 year ago Erroneous encounter - disregard   [place] [facility] [place], Salvadore Oxford, NP       Future Appointments             In 4 months Baity, Salvadore Oxford, NP [place] [facility], [facility]

## 2023-01-18 ENCOUNTER — Other Ambulatory Visit: Payer: Self-pay | Admitting: Cardiovascular Disease

## 2023-01-18 ENCOUNTER — Other Ambulatory Visit: Payer: Self-pay | Admitting: Internal Medicine

## 2023-01-18 DIAGNOSIS — Z0000 Encounter for general adult medical examination without abnormal findings: Secondary | ICD-10-CM

## 2023-01-18 NOTE — Telephone Encounter (Signed)
Refilled 01/18/23 # 30 by Dr. Kirke Corin Requested Prescriptions  Refused Prescriptions Disp Refills   lisinopril (ZESTRIL) 40 MG tablet [Pharmacy Med Name: LISINOPRIL 40 MG TABLET] 90 tablet 1    Sig: TAKE 1 TABLET BY MOUTH EVERY DAY     Cardiovascular:  ACE Inhibitors Passed - 01/18/2023 11:13 AM      Passed - Cr in normal range and within 180 days    Creat  Date Value Ref Range Status  11/28/2022 1.08 0.70 - 1.35 mg/dL Final         Passed - K in normal range and within 180 days    Potassium  Date Value Ref Range Status  11/28/2022 4.6 3.5 - 5.3 mmol/L Final         Passed - Patient is not pregnant      Passed - Last BP in normal range    BP Readings from Last 1 Encounters:  11/28/22 139/79         Passed - Valid encounter within last 6 months    Recent Outpatient Visits           1 month ago Mixed hyperlipidemia   [place] [facility] [place], Salvadore Oxford, NP   8 months ago [employer] to [employer] preventive visit   [place] [facility] [place], Salvadore Oxford, NP   1 year ago Acute nasopharyngitis   [place] [facility] [place], Salvadore Oxford, NP   1 year ago Flu-like symptoms   [place] [facility] [place], Salvadore Oxford, NP   1 year ago Erroneous encounter - disregard   [place] [facility] [place], Salvadore Oxford, NP       Future Appointments             In 4 months Baity, Salvadore Oxford, NP [place] [facility], [facility]

## 2023-01-18 NOTE — Telephone Encounter (Signed)
Requested Prescriptions  Pending Prescriptions Disp Refills   montelukast (SINGULAIR) 10 MG tablet [Pharmacy Med Name: MONTELUKAST SOD 10 MG TABLET] 90 tablet 0    Sig: TAKE 1 TABLET BY MOUTH EVERYDAY AT BEDTIME     Pulmonology:  Leukotriene Inhibitors Passed - 01/18/2023  5:32 AM      Passed - Valid encounter within last 12 months    Recent Outpatient Visits           1 month ago Mixed hyperlipidemia   [place] [facility] [place], Salvadore Oxford, NP   8 months ago Welcome to [employer] preventive visit   [place] [facility], Salvadore Oxford, NP   1 year ago Acute nasopharyngitis   [place] [facility] [place], Salvadore Oxford, NP   1 year ago Flu-like symptoms   [place] [facility] [place], Salvadore Oxford, NP   1 year ago Erroneous encounter - disregard   [place] [facility] [place], Salvadore Oxford, NP       Future Appointments             In 4 months Baity, Salvadore Oxford, NP [facility], [facility]

## 2023-01-18 NOTE — Telephone Encounter (Signed)
Please schedule overdue 6 month F/U appointment for 90 day refills. Thank you! °

## 2023-01-29 ENCOUNTER — Other Ambulatory Visit: Payer: Self-pay

## 2023-02-08 ENCOUNTER — Encounter: Payer: Medicare Other | Admitting: Neurosurgery

## 2023-02-09 ENCOUNTER — Encounter
Admission: RE | Admit: 2023-02-09 | Discharge: 2023-02-09 | Disposition: A | Discharge status: Home / Self Care | Payer: Medicare Other | Source: Ambulatory Visit | Attending: Neurosurgery | Admitting: Neurosurgery

## 2023-02-09 ENCOUNTER — Encounter (HOSPITAL_COMMUNITY): Payer: Self-pay | Admitting: Urgent Care

## 2023-02-09 ENCOUNTER — Telehealth: Payer: Self-pay

## 2023-02-09 VITALS — BP 145/93 | HR 73 | Resp 16 | Ht 74.0 in | Wt 267.0 lb

## 2023-02-09 DIAGNOSIS — Z01810 Encounter for preprocedural cardiovascular examination: Secondary | ICD-10-CM

## 2023-02-09 DIAGNOSIS — Z87891 Personal history of nicotine dependence: Secondary | ICD-10-CM

## 2023-02-09 DIAGNOSIS — Z01818 Encounter for other preprocedural examination: Secondary | ICD-10-CM | POA: Insufficient documentation

## 2023-02-09 DIAGNOSIS — I10 Essential (primary) hypertension: Secondary | ICD-10-CM | POA: Diagnosis not present

## 2023-02-09 DIAGNOSIS — Z01812 Encounter for preprocedural laboratory examination: Secondary | ICD-10-CM

## 2023-02-09 DIAGNOSIS — F17200 Nicotine dependence, unspecified, uncomplicated: Secondary | ICD-10-CM | POA: Diagnosis not present

## 2023-02-09 DIAGNOSIS — I214 Non-ST elevation (NSTEMI) myocardial infarction: Secondary | ICD-10-CM | POA: Insufficient documentation

## 2023-02-09 HISTORY — DX: Centrilobular emphysema: J43.2

## 2023-02-09 HISTORY — DX: Spinal stenosis, site unspecified: M48.00

## 2023-02-09 HISTORY — DX: Other nonthrombocytopenic purpura: D69.2

## 2023-02-09 HISTORY — DX: Dyspnea, unspecified: R06.00

## 2023-02-09 HISTORY — DX: Gastro-esophageal reflux disease without esophagitis: K21.9

## 2023-02-09 HISTORY — DX: Male erectile dysfunction, unspecified: N52.9

## 2023-02-09 HISTORY — DX: Non-ST elevation (NSTEMI) myocardial infarction: I21.4

## 2023-02-09 HISTORY — DX: Hyperlipidemia, unspecified: E78.5

## 2023-02-09 LAB — SURGICAL PCR SCREEN
MRSA, PCR: NEGATIVE
Staphylococcus aureus: NEGATIVE

## 2023-02-09 LAB — TYPE AND SCREEN
ABO/RH(D): O POS
Antibody Screen: NEGATIVE

## 2023-02-09 LAB — URINALYSIS, ROUTINE W REFLEX MICROSCOPIC
Bilirubin Urine: NEGATIVE
Glucose, UA: NEGATIVE mg/dL
Hgb urine dipstick: NEGATIVE
Ketones, ur: NEGATIVE mg/dL
Leukocytes,Ua: NEGATIVE
Nitrite: NEGATIVE
Protein, ur: NEGATIVE mg/dL
Specific Gravity, Urine: 1.012 (ref 1.005–1.030)
pH: 5 (ref 5.0–8.0)

## 2023-02-09 NOTE — Patient Instructions (Addendum)
Your procedure is scheduled on:02-21-23 Wednesday Report to the Registration Desk on the 1st floor of the Medical Mall.Then proceed to the 2nd floor Surgery Desk To find out your arrival time, please call [phone] between 1PM - 3PM on:02-20-23 Tuesday If your arrival time is 6:00 am, do not arrive before that time as the Medical Mall entrance doors do not open until 6:00 am.  REMEMBER: Instructions that are not followed completely may result in serious medical risk, up to and including death; or upon the discretion of your surgeon and anesthesiologist your surgery may need to be rescheduled.  Do not eat food after midnight the night before surgery.  No gum chewing or hard candies.  You may however, drink CLEAR liquids up to 2 hours before you are scheduled to arrive for your surgery. Do not drink anything within 2 hours of your scheduled arrival time.  Clear liquids include: - water  - apple juice without pulp - gatorade (not RED colors) - black coffee or tea (Do NOT add milk or creamers to the coffee or tea) Do NOT drink anything that is not on this list.  One week prior to surgery:Last dose will be on 02-13-23 Stop Anti-inflammatories (NSAIDS) such as Advil, Aleve, Ibuprofen, Motrin, Naproxen, Naprosyn and Aspirin based products such as Excedrin, Goody's Powder, BC Powder.You may however, take Tylenol/Percocet if needed for pain up until the day of surgery. Stop ANY OVER THE COUNTER supplements/vitamins 7 days prior to surgery -you may continue your Melatonin up until the night prior to surgery   Continue taking all prescribed medications  TAKE ONLY THESE MEDICATIONS THE MORNING OF SURGERY WITH A SIP OF WATER: -gabapentin (NEURONTIN)  -cyclobenzaprine (FLEXERIL)  -omeprazole (PRILOSEC)  Use your Budeson-Glycopyrrol-Formoterol (BREZTRI AEROSPHERE) and your albuterol (VENTOLIN HFA Inhaler the morning of surgery and bring your Albuterol Inhaler to the hospital  Continue your 81 mg  Aspirin up until the day prior to surgery-Do NOT take the morning of surgery  No Alcohol for 24 hours before or after surgery.  No Smoking including e-cigarettes for 24 hours before surgery.  No chewable tobacco products for at least 6 hours before surgery.  No nicotine patches on the day of surgery.  Do not use any "recreational" drugs for at least a week (preferably 2 weeks) before your surgery.  Please be advised that the combination of cocaine and anesthesia may have negative outcomes, up to and including death. If you test positive for cocaine, your surgery will be cancelled.  On the morning of surgery brush your teeth with toothpaste and water, you may rinse your mouth with mouthwash if you wish. Do not swallow any toothpaste or mouthwash.  Use CHG Soap or wipes as directed on instruction sheet.  Do not wear jewelry, make-up, hairpins, clips or nail polish.  Do not wear lotions, powders, or perfumes.   Do not shave body hair from the neck down 48 hours before surgery.  Contact lenses, hearing aids and dentures may not be worn into surgery.  Do not bring valuables to the hospital. [facility] is not responsible for any missing/lost belongings or valuables.   Notify your doctor if there is any change in your medical condition (cold, fever, infection).  Wear comfortable clothing (specific to your surgery type) to the hospital.  After surgery, you can help prevent lung complications by doing breathing exercises.  Take deep breaths and cough every 1-2 hours. Your doctor may order a device called an Incentive Spirometer to help you take  deep breaths. When coughing or sneezing, hold a pillow firmly against your incision with both hands. This is called "splinting." Doing this helps protect your incision. It also decreases belly discomfort.  If you are being admitted to the hospital overnight, leave your suitcase in the car. After surgery it may be brought to your room.  In case of  increased patient census, it may be necessary for you, the patient, to continue your postoperative care in the Same Day Surgery department.  If you are being discharged the day of surgery, you will not be allowed to drive home. You will need a responsible individual to drive you home and stay with you for 24 hours after surgery.   If you are taking public transportation, you will need to have a responsible individual with you.  Please call the Pre-admissions Testing Dept. at [phone] if you have any questions about these instructions.  Surgery Visitation Policy:  Patients having surgery or a procedure may have two visitors.  Children under the age of 65 must have an adult with them who is not the patient.  Inpatient Visitation:    Visiting hours are 7 a.m. to 8 p.m. Up to four visitors are allowed at one time in a patient room. The visitors may rotate out with other people during the day.  One visitor age 13 or older may stay with the patient overnight and must be in the room by 8 p.m.    Pre-operative 5 CHG Bath Instructions   You can play a key role in reducing the risk of infection after surgery. Your skin needs to be as free of germs as possible. You can reduce the number of germs on your skin by washing with CHG (chlorhexidine gluconate) soap before surgery. CHG is an antiseptic soap that kills germs and continues to kill germs even after washing.   DO NOT use if you have an allergy to chlorhexidine/CHG or antibacterial soaps. If your skin becomes reddened or irritated, stop using the CHG and notify one of our RNs at [phone].   Please shower with the CHG soap starting 4 days before surgery using the following schedule:     Please keep in mind the following:  DO NOT shave, including legs and underarms, starting the day of your first shower.   You may shave your face at any point before/day of surgery.  Place clean sheets on your bed the day you start using CHG  soap. Use a clean washcloth (not used since being washed) for each shower. DO NOT sleep with pets once you start using the CHG.   CHG Shower Instructions:  If you choose to wash your hair and private area, wash first with your normal shampoo/soap.  After you use shampoo/soap, rinse your hair and body thoroughly to remove shampoo/soap residue.  Turn the water OFF and apply about 3 tablespoons (45 ml) of CHG soap to a CLEAN washcloth.  Apply CHG soap ONLY FROM YOUR NECK DOWN TO YOUR TOES (washing for 3-5 minutes)  DO NOT use CHG soap on face, private areas, open wounds, or sores.  Pay special attention to the area where your surgery is being performed.  If you are having back surgery, having someone wash your back for you may be helpful. Wait 2 minutes after CHG soap is applied, then you may rinse off the CHG soap.  Pat dry with a clean towel  Put on clean clothes/pajamas   If you choose to wear lotion, please use  ONLY the CHG-compatible lotions on the back of this paper.     Additional instructions for the day of surgery: DO NOT APPLY any lotions, deodorants, cologne, or perfumes.   Put on clean/comfortable clothes.  Brush your teeth.  Ask your nurse before applying any prescription medications to the skin.      CHG Compatible Lotions   Aveeno Moisturizing lotion  Cetaphil Moisturizing Cream  Cetaphil Moisturizing Lotion  Clairol Herbal Essence Moisturizing Lotion, Dry Skin  Clairol Herbal Essence Moisturizing Lotion, Extra Dry Skin  Clairol Herbal Essence Moisturizing Lotion, Normal Skin  Curel Age Defying Therapeutic Moisturizing Lotion with Alpha Hydroxy  Curel Extreme Care Body Lotion  Curel Soothing Hands Moisturizing Hand Lotion  Curel Therapeutic Moisturizing Cream, Fragrance-Free  Curel Therapeutic Moisturizing Lotion, Fragrance-Free  Curel Therapeutic Moisturizing Lotion, Original Formula  Eucerin Daily Replenishing Lotion  Eucerin Dry Skin Therapy Plus Alpha Hydroxy  Crme  Eucerin Dry Skin Therapy Plus Alpha Hydroxy Lotion  Eucerin Original Crme  Eucerin Original Lotion  Eucerin Plus Crme Eucerin Plus Lotion  Eucerin TriLipid Replenishing Lotion  Keri Anti-Bacterial Hand Lotion  Keri Deep Conditioning Original Lotion Dry Skin Formula Softly Scented  Keri Deep Conditioning Original Lotion, Fragrance Free Sensitive Skin Formula  Keri Lotion Fast Absorbing Fragrance Free Sensitive Skin Formula  Keri Lotion Fast Absorbing Softly Scented Dry Skin Formula  Keri Original Lotion  Keri Skin Renewal Lotion Keri Silky Smooth Lotion  Keri Silky Smooth Sensitive Skin Lotion  Nivea Body Creamy Conditioning Oil  Nivea Body Extra Enriched Teacher, adult education Moisturizing Lotion Nivea Crme  Nivea Skin Firming Lotion  NutraDerm 30 Skin Lotion  NutraDerm Skin Lotion  NutraDerm Therapeutic Skin Cream  NutraDerm Therapeutic Skin Lotion  ProShield Protective Hand Cream  Provon moisturizing lotion

## 2023-02-09 NOTE — Telephone Encounter (Signed)
I received a message from pre-admit testing that this patient informed them he is smoking 2-3 cigarettes per day. Per discussion with Dr Myer Haff, his surgery will need to be canceled and he will need to call us once he has quit smoking for 30 days. I have notified the patient of this via mychart.

## 2023-02-14 LAB — NICOTINE/COTININE METABOLITES
Cotinine: 349.4 ng/mL
Nicotine: 38 ng/mL

## 2023-02-15 ENCOUNTER — Telehealth: Payer: Self-pay | Admitting: Internal Medicine

## 2023-02-15 ENCOUNTER — Telehealth: Payer: Self-pay | Admitting: Urgent Care

## 2023-02-15 NOTE — Telephone Encounter (Signed)
Patient called in asked to be taken off chantix and put on Wellbutrin instead. Unable to find he med on his med list.

## 2023-02-15 NOTE — Progress Notes (Signed)
  [facility] Perioperative Services: Pre-Admission/Anesthesia Testing  Abnormal Lab Notification   Date: 02/15/23  Name: Bryan Ibarra. MRN:   [id number]  Re: Abnormal labs noted during PAT appointment   Notified:    Provider Name Provider Role Notification Mode  Venetia Night, MD Neurosurgery Routed and/or faxed via [facility]   ABNORMAL LAB VALUE(S):   Nicotine 02/09/2023 38.0  ng/mL  Comment: (NOTE) This test was developed and its performance characteristics determined by [facility]. It has not been cleared or approved by the Food and Drug Administration. Nicotine levels greater than 2.0 are consistent with the use of tobacco or tobacco cessation products.   Cotinine 02/09/2023 349.4  ng/mL  Comment: (NOTE) This test was developed and its performance characteristics determined by [facility]. It has not been cleared or approved by the Food and Drug Administration. Cotinine levels greater than 20.0 are consistent with the use of tobacco or tobacco cessation products. Performed At: [facility] [address] [place], [place] [id number] Jolene Schimke MD GE:[id number]    Clinical Information and Notes:  Bryan Ibarra. is scheduled for L4-5 LATERAL LUMBAR INTERBODY FUSION AND POSTERIOR SPINAL FUSION; RIGHT L5-S1 FAR LATERAL DISCECTOMY on 02/21/2023.   Above lab results suggest that the patient is smoking and/or using nicotine containing cessation products. In the setting of her upcoming surgery, smoking significantly increases the risk of pseudoarthrosis. Smoking's effects on fusion rate are dependent on various factors, those of which include the type of arthrodesis procedure, spinal location and number of levels of the procedure, the type of graft used, etc. In addition to nonunion, smoking also increases the risk of other perioperative complications such as infection and can lead to adjacent-segment pathology,  Results communicated to  primary attending neurosurgeon and his staff for review. Following discussions with the office, MD has elected to postpone patient's case until a time where he can actively commit to smoking cessation. Decision was not made in haste. Patient's overall clinical condition and potential for negative outcomes were considered (benefits vs risk of delay). Patient has been made aware of decision by neurosurgery office.   Quentin Mulling, MSN, APRN, FNP-C, CEN [facility]  Peri-operative Services Nurse Practitioner Phone: [phone] Fax: [phone] 02/15/23 8:57 AM

## 2023-02-20 ENCOUNTER — Other Ambulatory Visit: Payer: Self-pay | Admitting: Internal Medicine

## 2023-02-20 ENCOUNTER — Other Ambulatory Visit: Payer: Self-pay

## 2023-02-20 DIAGNOSIS — Z0000 Encounter for general adult medical examination without abnormal findings: Secondary | ICD-10-CM

## 2023-02-20 MED ORDER — HYDROCODONE-ACETAMINOPHEN 5-325 MG PO TABS
1.0000 | ORAL_TABLET | Freq: Two times a day (BID) | ORAL | 0 refills | Status: DC | PRN
  Filled 2023-02-20: qty 15, 8d supply, fill #0

## 2023-02-20 NOTE — Telephone Encounter (Signed)
Too soon  Requested Prescriptions  Pending Prescriptions Disp Refills   montelukast (SINGULAIR) 10 MG tablet [Pharmacy Med Name: MONTELUKAST SOD 10 MG TABLET] 90 tablet 0    Sig: TAKE 1 TABLET BY MOUTH EVERYDAY AT BEDTIME     Pulmonology:  Leukotriene Inhibitors Passed - 02/20/2023  1:30 PM      Passed - Valid encounter within last 12 months    Recent Outpatient Visits           2 months ago Mixed hyperlipidemia   [place] [facility] [place], Salvadore Oxford, NP   9 months ago Welcome to [employer] preventive visit   [place] [facility] [place], Salvadore Oxford, NP   1 year ago Acute nasopharyngitis   [place] [facility] [place], Salvadore Oxford, NP   1 year ago Flu-like symptoms   [place] [facility] [place], Salvadore Oxford, NP   1 year ago Erroneous encounter - disregard   [place] [facility] [place], Salvadore Oxford, NP       Future Appointments             In 3 months Baity, Salvadore Oxford, NP  [facility]

## 2023-02-21 ENCOUNTER — Inpatient Hospital Stay: Admit: 2023-02-21 | Payer: Medicare Other | Admitting: Neurosurgery

## 2023-02-21 SURGERY — ANTERIOR LATERAL LUMBAR FUSION WITH PERCUTANEOUS SCREW 1 LEVEL
Anesthesia: General | Laterality: Right

## 2023-02-23 ENCOUNTER — Other Ambulatory Visit: Payer: Self-pay

## 2023-02-23 ENCOUNTER — Other Ambulatory Visit: Payer: Self-pay | Admitting: Orthopedic Surgery

## 2023-02-23 DIAGNOSIS — M5416 Radiculopathy, lumbar region: Secondary | ICD-10-CM

## 2023-02-23 MED ORDER — HYDROCODONE-ACETAMINOPHEN 5-325 MG PO TABS
1.0000 | ORAL_TABLET | Freq: Four times a day (QID) | ORAL | 0 refills | Status: DC | PRN
  Filled 2023-02-23: qty 60, 15d supply, fill #0

## 2023-02-23 MED ORDER — CYCLOBENZAPRINE HCL 5 MG PO TABS
5.0000 mg | ORAL_TABLET | Freq: Three times a day (TID) | ORAL | 0 refills | Status: DC | PRN
  Filled 2023-02-23: qty 60, 10d supply, fill #0

## 2023-02-23 MED ORDER — PREGABALIN 75 MG PO CAPS
75.0000 mg | ORAL_CAPSULE | Freq: Two times a day (BID) | ORAL | 0 refills | Status: DC
  Filled 2023-02-23: qty 60, 30d supply, fill #0

## 2023-03-02 ENCOUNTER — Ambulatory Visit
Admission: RE | Admit: 2023-03-02 | Discharge: 2023-03-02 | Disposition: A | Discharge status: Home / Self Care | Payer: Medicare Other | Source: Ambulatory Visit | Attending: Orthopedic Surgery | Admitting: Orthopedic Surgery

## 2023-03-02 DIAGNOSIS — M5416 Radiculopathy, lumbar region: Secondary | ICD-10-CM | POA: Insufficient documentation

## 2023-03-05 ENCOUNTER — Other Ambulatory Visit: Payer: Self-pay | Admitting: Orthopedic Surgery

## 2023-03-05 DIAGNOSIS — M545 Low back pain, unspecified: Secondary | ICD-10-CM

## 2023-03-08 ENCOUNTER — Encounter: Payer: Medicare Other | Admitting: Neurosurgery

## 2023-03-12 ENCOUNTER — Inpatient Hospital Stay: Admission: RE | Admit: 2023-03-12 | Payer: Medicare Other | Source: Ambulatory Visit

## 2023-03-12 DIAGNOSIS — M545 Low back pain, unspecified: Secondary | ICD-10-CM

## 2023-03-12 MED ORDER — IOPAMIDOL (ISOVUE-M 200) INJECTION 41%
1.0000 mL | Freq: Once | INTRAMUSCULAR | Status: AC
  Administered 2023-03-12: 1 mL via EPIDURAL

## 2023-03-12 MED ORDER — METHYLPREDNISOLONE ACETATE 40 MG/ML INJ SUSP (RADIOLOG
80.0000 mg | Freq: Once | INTRAMUSCULAR | Status: AC
  Administered 2023-03-12: 80 mg via EPIDURAL

## 2023-03-12 NOTE — Discharge Instructions (Signed)
Post Procedure Spinal Discharge Instruction Sheet  You may resume a regular diet and any medications that you routinely take (including pain medications) unless otherwise noted by MD.  No driving day of procedure.  Light activity throughout the rest of the day.  Do not do any strenuous work, exercise, bending or lifting.  The day following the procedure, you can resume normal physical activity but you should refrain from exercising or physical therapy for at least three days thereafter.  You may apply ice to the injection site, 20 minutes on, 20 minutes off, as needed. Do not apply ice directly to skin.    Common Side Effects:  Headaches- take your usual medications as directed by your physician.  Increase your fluid intake.  Caffeinated beverages may be helpful.  Lie flat in bed until your headache resolves.  Restlessness or inability to sleep- you may have trouble sleeping for the next few days.  Ask your referring physician if you need any medication for sleep.  Facial flushing or redness- should subside within a few days.  Increased pain- a temporary increase in pain a day or two following your procedure is not unusual.  Take your pain medication as prescribed by your referring physician.  Leg cramps  Please contact our office at [phone] for the following symptoms: Fever greater than 100 degrees. Headaches unresolved with medication after 2-3 days. Increased swelling, pain, or redness at injection site.  YOU MAY RESUME YOUR ASPIRIN/GOODY'S POWDER TODAY, POST PROCEDURE  Thank you for visiting [facility] today.

## 2023-03-20 ENCOUNTER — Encounter: Payer: Medicare Other | Admitting: Neurosurgery

## 2023-03-22 ENCOUNTER — Other Ambulatory Visit: Payer: Self-pay

## 2023-03-22 ENCOUNTER — Other Ambulatory Visit (HOSPITAL_COMMUNITY): Payer: Self-pay

## 2023-03-23 ENCOUNTER — Other Ambulatory Visit: Payer: Self-pay

## 2023-03-23 MED ORDER — CYCLOBENZAPRINE HCL 5 MG PO TABS
5.0000 mg | ORAL_TABLET | Freq: Three times a day (TID) | ORAL | 0 refills | Status: DC | PRN
  Filled 2023-03-23: qty 60, 10d supply, fill #0

## 2023-03-23 MED ORDER — HYDROCODONE-ACETAMINOPHEN 5-325 MG PO TABS
1.0000 | ORAL_TABLET | Freq: Four times a day (QID) | ORAL | 0 refills | Status: DC | PRN
  Filled 2023-03-23: qty 60, 15d supply, fill #0

## 2023-03-26 ENCOUNTER — Other Ambulatory Visit: Payer: Self-pay

## 2023-03-26 MED ORDER — PREGABALIN 75 MG PO CAPS
75.0000 mg | ORAL_CAPSULE | Freq: Two times a day (BID) | ORAL | 0 refills | Status: DC
  Filled 2023-03-26: qty 60, 30d supply, fill #0

## 2023-04-03 ENCOUNTER — Other Ambulatory Visit: Payer: Self-pay | Admitting: Orthopedic Surgery

## 2023-04-04 ENCOUNTER — Other Ambulatory Visit: Payer: Self-pay | Admitting: Internal Medicine

## 2023-04-04 DIAGNOSIS — Z0000 Encounter for general adult medical examination without abnormal findings: Secondary | ICD-10-CM

## 2023-04-04 NOTE — Telephone Encounter (Signed)
Medication Refill - Medication: albuterol (VENTOLIN HFA) 108 (90 Base) MCG/ACT inhaler  Budeson-Glycopyrrol-Formoterol (BREZTRI AEROSPHERE) 160-9-4.8 MCG/ACT AERO (Pt is out of medication)  omeprazole (PRILOSEC) 40 MG capsule (Pt is out of medication)   Has the patient contacted their pharmacy? No.  Preferred Pharmacy (with phone number or street name):  [id number] [place],  - [address]  Phone: [phone] Fax: [phone]  Has the patient been seen for an appointment in the last year OR does the patient have an upcoming appointment? Yes.    Agent: Please be advised that RX refills may take up to 3 business days. We ask that you follow-up with your pharmacy.

## 2023-04-05 ENCOUNTER — Telehealth: Payer: Self-pay | Admitting: *Deleted

## 2023-04-05 ENCOUNTER — Other Ambulatory Visit: Payer: Self-pay | Admitting: Internal Medicine

## 2023-04-05 ENCOUNTER — Encounter: Payer: Medicare Other | Admitting: Neurosurgery

## 2023-04-05 DIAGNOSIS — Z0000 Encounter for general adult medical examination without abnormal findings: Secondary | ICD-10-CM

## 2023-04-05 MED ORDER — BREZTRI AEROSPHERE 160-9-4.8 MCG/ACT IN AERO: 2.0000 | INHALATION_SPRAY | Freq: Two times a day (BID) | RESPIRATORY_TRACT | 3 refills | Status: DC

## 2023-04-05 MED ORDER — OMEPRAZOLE 40 MG PO CPDR: 40.0000 mg | DELAYED_RELEASE_CAPSULE | Freq: Every day | ORAL | 0 refills | Status: DC

## 2023-04-05 MED ORDER — ALBUTEROL SULFATE HFA 108 (90 BASE) MCG/ACT IN AERS: 2.0000 | INHALATION_SPRAY | Freq: Four times a day (QID) | RESPIRATORY_TRACT | 0 refills | Status: DC | PRN

## 2023-04-05 NOTE — Telephone Encounter (Signed)
Pt has been added to 04/06/23 @ 3:40 pre op schedule for tele pre op appt. Add on per pre op APP due to med hold and urgent procedure date. Med rec and consent are done. Pt tells me that surgeon already told him to hold ASA as of today, last dose he took was yesterday. I confirmed pt is not having any chest pain.

## 2023-04-05 NOTE — Telephone Encounter (Signed)
Pt has been added to 04/06/23 @ 3:40 pre op schedule for tele pre op appt. Add on per pre op APP due to med hold and urgent procedure date. Med rec and consent are done. Pt tells me that surgeon already told him to hold ASA as of today, last dose he took was yesterday. I confirmed pt is not having any chest pain.     Patient Consent for Virtual Visit        Bryan Ibarra. has provided verbal consent on 04/05/2023 for a virtual visit (video or telephone).   CONSENT FOR VIRTUAL VISIT FOR:  Bryan Ibarra.  By participating in this virtual visit I agree to the following:  I hereby voluntarily request, consent and authorize [facility] and its employed or contracted physicians, physician assistants, nurse practitioners or other licensed health care professionals (the Practitioner), to provide me with telemedicine health care services (the "Services") as deemed necessary by the treating Practitioner. I acknowledge and consent to receive the Services by the Practitioner via telemedicine. I understand that the telemedicine visit will involve communicating with the Practitioner through live audiovisual communication technology and the disclosure of certain medical information by electronic transmission. I acknowledge that I have been given the opportunity to request an in-person assessment or other available alternative prior to the telemedicine visit and am voluntarily participating in the telemedicine visit.  I understand that I have the right to withhold or withdraw my consent to the use of telemedicine in the course of my care at any time, without affecting my right to future care or treatment, and that the Practitioner or I may terminate the telemedicine visit at any time. I understand that I have the right to inspect all information obtained and/or recorded in the course of the telemedicine visit and may receive copies of available information for a reasonable fee.  I understand that  some of the potential risks of receiving the Services via telemedicine include:  Delay or interruption in medical evaluation due to technological equipment failure or disruption; Information transmitted may not be sufficient (e.g. poor resolution of images) to allow for appropriate medical decision making by the Practitioner; and/or  In rare instances, security protocols could fail, causing a breach of personal health information.  Furthermore, I acknowledge that it is my responsibility to provide information about my medical history, conditions and care that is complete and accurate to the best of my ability. I acknowledge that Practitioner's advice, recommendations, and/or decision may be based on factors not within their control, such as incomplete or inaccurate data provided by me or distortions of diagnostic images or specimens that may result from electronic transmissions. I understand that the practice of medicine is not an exact science and that Practitioner makes no warranties or guarantees regarding treatment outcomes. I acknowledge that a copy of this consent can be made available to me via my patient portal [facility] MyChart), or I can request a printed copy by calling the office of  [facility].    I understand that my insurance will be billed for this visit.   I have read or had this consent read to me. I understand the contents of this consent, which adequately explains the benefits and risks of the Services being provided via telemedicine.  I have been provided ample opportunity to ask questions regarding this consent and the Services and have had my questions answered to my satisfaction. I give my informed consent for the services to be  provided through the use of telemedicine in my medical care

## 2023-04-05 NOTE — Telephone Encounter (Signed)
   Name: Bryan Ibarra.  DOB: 19-Nov-1955  MRN: [id number]  Primary Cardiologist: Lorine Bears, MD   Preoperative team, please contact this patient and set up a phone call appointment for further preoperative risk assessment. Please obtain consent and complete medication review. Please add on to soonest available pre-op provider slot. Thank you for your help.  I confirm that guidance regarding antiplatelet and oral anticoagulation therapy has been completed and, if necessary, noted below.  Per office protocol, if patient is without any new symptoms or concerns at the time of their virtual visit, he/she may hold ASA for 7 days prior to procedure. Please resume ASA as soon as possible postprocedure, at the discretion of the surgeon.     [name], PA-C 04/05/2023, 10:36 AM [facility]

## 2023-04-05 NOTE — Telephone Encounter (Signed)
Requested Prescriptions  Pending Prescriptions Disp Refills   albuterol (VENTOLIN HFA) 108 (90 Base) MCG/ACT inhaler 8.5 each 0    Sig: Inhale 2 puffs into the lungs every 6 (six) hours as needed for wheezing or shortness of breath.     Pulmonology:  Beta Agonists 2 Failed - 04/04/2023 10:45 AM      Failed - Last BP in normal range    BP Readings from Last 1 Encounters:  03/12/23 (!) 155/99         Passed - Last Heart Rate in normal range    Pulse Readings from Last 1 Encounters:  03/12/23 68         Passed - Valid encounter within last 12 months    Recent Outpatient Visits           4 months ago Mixed hyperlipidemia   [place] [employer] [place], Salvadore Oxford, NP   10 months ago Welcome to [employer] preventive visit   [place] [facility] [place], Salvadore Oxford, NP   1 year ago Acute nasopharyngitis   [place] [facility] [place], Salvadore Oxford, NP   1 year ago Flu-like symptoms   [place] [facility] [place], Salvadore Oxford, NP   1 year ago Erroneous encounter - disregard   [place] [facility] [place], Salvadore Oxford, NP       Future Appointments             In 1 month [place], Salvadore Oxford, NP [place] [facility]             Budeson-Glycopyrrol-Formoterol (BREZTRI AEROSPHERE) 160-9-4.8 MCG/ACT AERO 10.7 g 3    Sig: Inhale 2 puffs into the lungs 2 (two) times daily.     Off-Protocol Failed - 04/04/2023 10:45 AM      Failed - Medication not assigned to a protocol, review manually.      Passed - Valid encounter within last 12 months    Recent Outpatient Visits           4 months ago Mixed hyperlipidemia   [place] [facility] [place], Salvadore Oxford, NP   10 months ago Welcome to [employer] preventive visit   [place] [facility] [place], Salvadore Oxford, NP   1 year ago Acute nasopharyngitis   [place] [facility], Salvadore Oxford, NP   1 year  ago Flu-like symptoms   [place] [facility] [place], Salvadore Oxford, NP   1 year ago Erroneous encounter - disregard   [place] [facility] [place], Salvadore Oxford, NP       Future Appointments             In 1 month [place], Salvadore Oxford, NP [place] [facility]             omeprazole (PRILOSEC) 40 MG capsule 90 capsule 0    Sig: TAKE 1 CAPSULE (40 MG TOTAL) BY MOUTH DAILY.     Gastroenterology: Proton Pump Inhibitors Passed - 04/04/2023 10:45 AM      Passed - Valid encounter within last 12 months    Recent Outpatient Visits           4 months ago Mixed hyperlipidemia   [place] [facility] [place], Salvadore Oxford, NP   10 months ago Welcome to [employer] preventive visit   [facility] [place], Salvadore Oxford, NP   1 year ago Acute nasopharyngitis   [place] [facility] [place], Salvadore Oxford, NP   1 year ago Flu-like symptoms   [place] [place], Salvadore Oxford, NP   1 year ago Erroneous encounter - disregard   [place] [facility] [place], Salvadore Oxford, NP       Future Appointments             In 1 month Baity, Salvadore Oxford, NP [place] [facility]

## 2023-04-05 NOTE — Telephone Encounter (Signed)
   Pre-operative Risk Assessment    Patient Name: Bryan Ibarra.  DOB: Dec 11, 1955 MRN: [id number]   DATE OF LAST VISIT:  05/24/22 SHERI HAMMOCK DATE OF NEXT VISIT: NONE  Request for Surgical Clearance    Procedure:   RIGHT SIDED LUMBAR 4-5 TRANSFORAMINAL LUMBAR INTERBODY FUSION WITH INSTRUMENTATION AND ALLOGRAFT  Date of Surgery:  Clearance TBD (URGENT)                                Surgeon:  DR. MARK DUMONSKI Surgeon's Group or Practice Name:  [facility] Phone number:  [phone] ATTN: Tobey Bride Fax number:  [phone]   Type of Clearance Requested:   - Medical ; ASA    Type of Anesthesia:  General    Additional requests/questions:    [name]   04/05/2023, 9:46 AM

## 2023-04-06 ENCOUNTER — Ambulatory Visit: Payer: Medicare Other | Attending: Cardiology

## 2023-04-06 DIAGNOSIS — Z01810 Encounter for preprocedural cardiovascular examination: Secondary | ICD-10-CM

## 2023-04-06 DIAGNOSIS — Z01818 Encounter for other preprocedural examination: Secondary | ICD-10-CM

## 2023-04-06 NOTE — Progress Notes (Signed)
Virtual Visit via Telephone Note   Because of Bryan CLEMENSON Jr.'s co-morbid illnesses, he is at least at moderate risk for complications without adequate follow up.  This format is felt to be most appropriate for this patient at this time.  The patient did not have access to video technology/had technical difficulties with video requiring transitioning to audio format only (telephone).  All issues noted in this document were discussed and addressed.  No physical exam could be performed with this format.  Please refer to the patient's chart for his consent to telehealth for [facility].  Evaluation Performed:  Preoperative cardiovascular risk assessment _____________   Date:  04/06/2023   Patient ID:  Bryan Ibarra., DOB 14-Apr-1956, MRN [id number] Patient Location:  Home Provider location:   Office  Primary Care Provider:  Lorre Munroe, NP Primary Cardiologist:  Lorine Bears, MD  Chief Complaint / Patient Profile   67 y.o. y/o male with a h/o coronary artery disease status post PCI with drug-eluting stent to the proximal LAD, hypertension, polysubstance abuse, COPD.   He is pending right sided lumbar 4-5 transforaminal lumbar interbody fusion with instrumentation and autograft on 04/12/2023, with Dr. Remonia Richter with [facility] and presents today for telephonic preoperative cardiovascular risk assessment and recommendations for holding aspirin.  History of Present Illness    Bryan Ibarra. is a 67 y.o. male who presents via audio/video conferencing for a telehealth visit today.  Pt was last seen in cardiology clinic on 05/24/2022 by Arida.  At that time [name] was doing well.   The patient is now pending procedure as outlined above. Since his last visit, he he has not been very active due to chronic back pain, he states he has gained about 50 pounds after taking a lot of gabapentin and being more sedentary.  He is able to complete his own  ADLs, but has to be careful standing long periods of time as his right leg will become weak and "give out" causing him to almost fall.  He denies any chest pain, he does have chronic shortness of breath from COPD, he denies dizziness, palpitations, or significant fatigue.  He has been medically compliant.  Past Medical History    Past Medical History:  Diagnosis Date   Alcohol abuse    a. up to a 12 pack of beer/day.   Arthritis    CAD in native artery    a. LHC 6/18: severe 1 vessel CAD with 90% thrombotic stenosis in the proximal LAD which was felt to be the culprit for the patient's non-STEMI. He underwent successful PCI/DES with a resolute onyx drug-eluting stent (3.0 x 18 mm). There was 0% residual stenosis. LVEF estimated at 55-65% by visual estimate. LVEDP was mildly elevated   Centrilobular emphysema (HCC)    Dyspnea    ED (erectile dysfunction)    GERD (gastroesophageal reflux disease)    H/O echocardiogram    a. echo 6/18: EF 60-65%, normal wall motion, grade 1 diastolic dysfunction, RV cavity size was normal with normal wall thickness and normal RV systolic function. There were no significant valvular abnormalities   Hearing loss    HLD (hyperlipidemia)    Hypertension    Marijuana abuse    a. occasional.   NSTEMI (non-ST elevated myocardial infarction) (HCC)    a. LHC 6/18: severe 1 vessel CAD with 90% thrombotic stenosis in the proximal LAD which was felt to be the culprit for the  patient's non-STEMI. He underwent successful PCI/DES with a resolute onyx drug-eluting stent (3.0 x 18 mm). There was 0% residual stenosis. LVEF estimated at 55-65% by visual estimate. LVEDP was mildly elevated   Senile purpura (HCC)    Spinal stenosis    Tobacco abuse    Past Surgical History:  Procedure Laterality Date   APPENDECTOMY     COLONOSCOPY     CORONARY STENT INTERVENTION N/A 01/01/2017   Procedure: Coronary Stent Intervention;  Surgeon: Iran Ouch, MD;  Location: [facility]  INVASIVE CV LAB;  Service: Cardiovascular;  Laterality: N/A;   KNEE SURGERY Left    x2   LEFT HEART CATH AND CORONARY ANGIOGRAPHY N/A 01/01/2017   Procedure: Left Heart Cath and Coronary Angiography;  Surgeon: Iran Ouch, MD;  Location: [facility] INVASIVE CV LAB;  Service: Cardiovascular;  Laterality: N/A;   NASAL ENDOSCOPY N/A 08/02/2019   Procedure: NASAL ENDOSCOPY;  Surgeon: Tamela Gammon, MD;  Location: [facility] ORS;  Service: ENT;  Laterality: N/A;   NASAL HEMORRHAGE CONTROL N/A 08/02/2019   Procedure: EPISTAXIS CONTROL;  Surgeon: Tamela Gammon, MD;  Location: [facility] ORS;  Service: ENT;  Laterality: N/A;    Allergies  No Known Allergies  Home Medications    Prior to Admission medications   Medication Sig Start Date End Date Taking? Authorizing Provider  albuterol (VENTOLIN HFA) 108 (90 Base) MCG/ACT inhaler Inhale 2 puffs into the lungs every 6 (six) hours as needed for wheezing or shortness of breath. 04/05/23   Lorre Munroe, NP  amLODipine (NORVASC) 5 MG tablet TAKE 1 TABLET (5 MG TOTAL) BY MOUTH DAILY. Patient taking differently: Take 5 mg by mouth at bedtime. 12/11/22   Lorre Munroe, NP  aspirin EC 81 MG tablet Take 1 tablet (81 mg total) by mouth daily. Hold for 3 days. Patient taking differently: Take 81 mg by mouth at bedtime. 08/02/19   Ackall, Ardine Eng, MD  Aspirin-Salicylamide-Caffeine (BC HEADACHE PO) Take 1 packet by mouth daily as needed (pain). Patient not taking: Reported on 04/05/2023    [provider]  Budeson-Glycopyrrol-Formoterol (BREZTRI AEROSPHERE) 160-9-4.8 MCG/ACT AERO Inhale 2 puffs into the lungs 2 (two) times daily. 04/05/23   Lorre Munroe, NP  carvedilol (COREG) 6.25 MG tablet TAKE 1 TABLET(6.25 MG) BY MOUTH TWICE DAILY WITH A MEAL Patient taking differently: Take 6.25 mg by mouth at bedtime. TAKE 1 TABLET(6.25 MG) BY MOUTH TWICE DAILY WITH A MEAL 10/18/22   Lorre Munroe, NP  cyclobenzaprine (FLEXERIL) 5 MG tablet Take 1-2 tablets (5-10 mg  total) by mouth 3 (three) times daily as needed. 03/23/23     Emollient (DERMEND FRAGILE SKIN EX) Apply 1 Application topically daily as needed (bruising).    [provider]  gabapentin (NEURONTIN) 300 MG capsule Take 1 capsule (300 mg total) by mouth 2 (two) times daily. Patient not taking: Reported on 04/03/2023 11/16/22   Lorre Munroe, NP  HYDROcodone-acetaminophen (NORCO/VICODIN) 5-325 MG tablet Take 1 tablet by mouth every 6 (six) to 8 (eight) hours as needed. 03/23/23     lisinopril (ZESTRIL) 40 MG tablet TAKE 1 TABLET(40 MG) BY MOUTH DAILY//NEED FOLLOW UP APPOINTMENT SCHEDULED PRIOR TO 90 DAY SUPPLY Patient taking differently: Take 40 mg by mouth at bedtime. 01/18/23   Iran Ouch, MD  Melatonin 10 MG TABS Take 10 mg by mouth at bedtime.    [provider]  montelukast (SINGULAIR) 10 MG tablet TAKE 1 TABLET BY MOUTH EVERYDAY AT BEDTIME Patient taking differently:  Take 10 mg by mouth daily before lunch. 01/18/23   Lorre Munroe, NP  omeprazole (PRILOSEC) 40 MG capsule TAKE 1 CAPSULE (40 MG TOTAL) BY MOUTH DAILY. 04/05/23   Lorre Munroe, NP  pregabalin (LYRICA) 75 MG capsule Take 1 capsule (75 mg total) by mouth 2 (two) times daily. 03/26/23     rosuvastatin (CRESTOR) 10 MG tablet TAKE 1 TABLET BY MOUTH EVERY DAY Patient taking differently: Take 10 mg by mouth at bedtime. 12/18/22   Lorre Munroe, NP    Physical Exam    Vital Signs:  Alasdair Walicki. does not have vital signs available for review today.   Given telephonic nature of communication, physical exam is limited. AAOx3. NAD. Normal affect.  Speech and respirations are unlabored.  Accessory Clinical Findings    None  Assessment & Plan    1.  Preoperative Cardiovascular Risk Assessment:  According to the Revised Cardiac Risk Index (RCRI), his Perioperative Risk of Major Cardiac Event is (%): 0.9  His Functional Capacity in METs is: 5.5 according to the [facility] Activity Status Index (DASI).   Activity is limited by chronic back pain, right leg weakness, and weight gain.  The patient was advised that if he develops new symptoms prior to surgery to contact our office to arrange for a follow-up visit, and he verbalized understanding.  Per office protocol, if patient is without any new symptoms or concerns at the time of their virtual visit, he/she may hold ASA for 7  days prior to procedure. Please resume ASA as soon as possible postprocedure, at the discretion of the surgeon.   Therefore, based on ACC/AHA guidelines, patient would be at acceptable risk for the planned procedure without further cardiovascular testing. I will route this recommendation to the requesting party via Epic fax function.    A copy of this note will be routed to requesting surgeon.  Time:   Today, I have spent 10 minutes with the patient with telehealth technology discussing medical history, symptoms, and management plan.     Joni Reining, NP   04/06/2023, 3:41 PM

## 2023-04-06 NOTE — Telephone Encounter (Signed)
Requested Prescriptions  Pending Prescriptions Disp Refills   montelukast (SINGULAIR) 10 MG tablet [Pharmacy Med Name: MONTELUKAST SOD 10 MG TABLET] 90 tablet 0    Sig: TAKE 1 TABLET BY MOUTH EVERYDAY AT BEDTIME     Pulmonology:  Leukotriene Inhibitors Passed - 04/05/2023  2:01 PM      Passed - Valid encounter within last 12 months    Recent Outpatient Visits           4 months ago Mixed hyperlipidemia   [facility] [place], Salvadore Oxford, NP   10 months ago Welcome to [employer] preventive visit   [place] [facility] [place], Salvadore Oxford, NP   1 year ago Acute nasopharyngitis   [facility] [place], Salvadore Oxford, NP   1 year ago Flu-like symptoms   [place] [facility] [place], Salvadore Oxford, NP   1 year ago Erroneous encounter - disregard   [place] [facility] [place], Salvadore Oxford, NP       Future Appointments             In 1 month Baity, Salvadore Oxford, NP  [facility]

## 2023-04-09 NOTE — Pre-Procedure Instructions (Signed)
Surgical Instructions   Your procedure is scheduled on Thursday, September 12th. Report to [facility] Main Entrance "A" at 05:30 A.M., then check in with the Admitting office. Any questions or running late day of surgery: call [phone]  Questions prior to your surgery date: call [phone], Monday-Friday, 8am-4pm. If you experience any cold or flu symptoms such as cough, fever, chills, shortness of breath, etc. between now and your scheduled surgery, please notify us at the above number.     Remember:  Do not eat after midnight the night before your surgery  You may drink clear liquids until 04:30 AM the morning of your surgery.   Clear liquids allowed are: Water, Non-Citrus Juices (without pulp), Carbonated Beverages, Clear Tea, Black Coffee Only (NO MILK, CREAM OR POWDERED CREAMER of any kind), and Gatorade.  Patient Instructions  The night before surgery:  No food after midnight. ONLY clear liquids after midnight  The day of surgery (if you do NOT have diabetes):  Drink ONE (1) Pre-Surgery Clear Ensure by 04:30 AM the morning of surgery. Drink in one sitting. Do not sip.  This drink was given to you during your hospital  pre-op appointment visit.  Nothing else to drink after completing the  Pre-Surgery Clear Ensure.          If you have questions, please contact your surgeon's office.    Take these medicines the morning of surgery with A SIP OF WATER  Budeson-Glycopyrrol-Formoterol (BREZTRI AEROSPHERE)  carvedilol (COREG)  omeprazole (PRILOSEC)  pregabalin (LYRICA)    May take these medicines IF NEEDED: albuterol (VENTOLIN HFA)- bring inhaler with you on day of surgery cyclobenzaprine (FLEXERIL)  HYDROcodone-acetaminophen (NORCO/VICODIN)    Follow your surgeon's instructions on when to stop Aspirin.  If no instructions were given by your surgeon then you will need to call the office to get those instructions.     One week prior to surgery, STOP taking any  Aleve, Naproxen, Ibuprofen, Motrin, Advil, Goody's, BC's, all herbal medications, fish oil, and non-prescription vitamins.                     Do NOT Smoke (Tobacco/Vaping) for 24 hours prior to your procedure.  If you use a CPAP at night, you may bring your mask/headgear for your overnight stay.   You will be asked to remove any contacts, glasses, piercing's, hearing aid's, dentures/partials prior to surgery. Please bring cases for these items if needed.    Patients discharged the day of surgery will not be allowed to drive home, and someone needs to stay with them for 24 hours.  SURGICAL WAITING ROOM VISITATION Patients may have no more than 2 support people in the waiting area - these visitors may rotate.   Pre-op nurse will coordinate an appropriate time for 1 ADULT support person, who may not rotate, to accompany patient in pre-op.  Children under the age of 62 must have an adult with them who is not the patient and must remain in the main waiting area with an adult.  If the patient needs to stay at the hospital during part of their recovery, the visitor guidelines for inpatient rooms apply.  Please refer to the [facility] website for the visitor guidelines for any additional information.   If you received a COVID test during your pre-op visit  it is requested that you wear a mask when out in public, stay away from anyone that may not be feeling well and notify your surgeon if  you develop symptoms. If you have been in contact with anyone that has tested positive in the last 10 days please notify you surgeon.      Pre-operative 5 CHG Bathing Instructions   You can play a key role in reducing the risk of infection after surgery. Your skin needs to be as free of germs as possible. You can reduce the number of germs on your skin by washing with CHG (chlorhexidine gluconate) soap before surgery. CHG is an antiseptic soap that kills germs and continues to kill germs even after washing.    DO NOT use if you have an allergy to chlorhexidine/CHG or antibacterial soaps. If your skin becomes reddened or irritated, stop using the CHG and notify one of our RNs at [phone].   Please shower with the CHG soap starting 4 days before surgery using the following schedule:     Please keep in mind the following:  DO NOT shave, including legs and underarms, starting the day of your first shower.   You may shave your face at any point before/day of surgery.  Place clean sheets on your bed the day you start using CHG soap. Use a clean washcloth (not used since being washed) for each shower. DO NOT sleep with pets once you start using the CHG.   CHG Shower Instructions:  If you choose to wash your hair and private area, wash first with your normal shampoo/soap.  After you use shampoo/soap, rinse your hair and body thoroughly to remove shampoo/soap residue.  Turn the water OFF and apply about 3 tablespoons (45 ml) of CHG soap to a CLEAN washcloth.  Apply CHG soap ONLY FROM YOUR NECK DOWN TO YOUR TOES (washing for 3-5 minutes)  DO NOT use CHG soap on face, private areas, open wounds, or sores.  Pay special attention to the area where your surgery is being performed.  If you are having back surgery, having someone wash your back for you may be helpful. Wait 2 minutes after CHG soap is applied, then you may rinse off the CHG soap.  Pat dry with a clean towel  Put on clean clothes/pajamas   If you choose to wear lotion, please use ONLY the CHG-compatible lotions on the back of this paper.   Additional instructions for the day of surgery: DO NOT APPLY any lotions, deodorants, cologne, or perfumes.   Do not bring valuables to the hospital. [facility] is not responsible for any belongings/valuables. Do not wear nail polish, gel polish, artificial nails, or any other type of covering on natural nails (fingers and toes) Do not wear jewelry or makeup Put on clean/comfortable clothes.   Please brush your teeth.  Ask your nurse before applying any prescription medications to the skin.     CHG Compatible Lotions   Aveeno Moisturizing lotion  Cetaphil Moisturizing Cream  Cetaphil Moisturizing Lotion  Clairol Herbal Essence Moisturizing Lotion, Dry Skin  Clairol Herbal Essence Moisturizing Lotion, Extra Dry Skin  Clairol Herbal Essence Moisturizing Lotion, Normal Skin  Curel Age Defying Therapeutic Moisturizing Lotion with Alpha Hydroxy  Curel Extreme Care Body Lotion  Curel Soothing Hands Moisturizing Hand Lotion  Curel Therapeutic Moisturizing Cream, Fragrance-Free  Curel Therapeutic Moisturizing Lotion, Fragrance-Free  Curel Therapeutic Moisturizing Lotion, Original Formula  Eucerin Daily Replenishing Lotion  Eucerin Dry Skin Therapy Plus Alpha Hydroxy Crme  Eucerin Dry Skin Therapy Plus Alpha Hydroxy Lotion  Eucerin Original Crme  Eucerin Original Lotion  Eucerin Plus Crme Eucerin Plus Lotion  Eucerin TriLipid Replenishing  Lotion  Keri Anti-Bacterial Hand Lotion  Keri Deep Conditioning Original Lotion Dry Skin Formula Softly Scented  Keri Deep Conditioning Original Lotion, Fragrance Free Sensitive Skin Formula  Keri Lotion Fast Absorbing Fragrance Free Sensitive Skin Formula  Keri Lotion Fast Absorbing Softly Scented Dry Skin Formula  Keri Original Lotion  Keri Skin Renewal Lotion Keri Silky Smooth Lotion  Keri Silky Smooth Sensitive Skin Lotion  Nivea Body Creamy Conditioning Oil  Nivea Body Extra Enriched Lotion  Nivea Body Original Lotion  Nivea Body Sheer Moisturizing Lotion Nivea Crme  Nivea Skin Firming Lotion  NutraDerm 30 Skin Lotion  NutraDerm Skin Lotion  NutraDerm Therapeutic Skin Cream  NutraDerm Therapeutic Skin Lotion  ProShield Protective Hand Cream  Provon moisturizing lotion  Please read over the following fact sheets that you were given.

## 2023-04-10 ENCOUNTER — Encounter (HOSPITAL_COMMUNITY): Payer: Self-pay

## 2023-04-10 ENCOUNTER — Encounter (HOSPITAL_COMMUNITY)
Admission: RE | Admit: 2023-04-10 | Discharge: 2023-04-10 | Disposition: A | Discharge status: Home / Self Care | Payer: Medicare Other | Source: Ambulatory Visit | Attending: Orthopedic Surgery | Admitting: Orthopedic Surgery

## 2023-04-10 ENCOUNTER — Other Ambulatory Visit: Payer: Self-pay

## 2023-04-10 VITALS — BP 146/96 | HR 70 | Temp 98.1°F | Resp 19 | Ht 74.0 in | Wt 265.0 lb

## 2023-04-10 DIAGNOSIS — Z01812 Encounter for preprocedural laboratory examination: Secondary | ICD-10-CM | POA: Insufficient documentation

## 2023-04-10 DIAGNOSIS — I252 Old myocardial infarction: Secondary | ICD-10-CM | POA: Insufficient documentation

## 2023-04-10 DIAGNOSIS — E785 Hyperlipidemia, unspecified: Secondary | ICD-10-CM | POA: Diagnosis not present

## 2023-04-10 DIAGNOSIS — F17200 Nicotine dependence, unspecified, uncomplicated: Secondary | ICD-10-CM | POA: Diagnosis not present

## 2023-04-10 DIAGNOSIS — J449 Chronic obstructive pulmonary disease, unspecified: Secondary | ICD-10-CM | POA: Diagnosis not present

## 2023-04-10 DIAGNOSIS — I10 Essential (primary) hypertension: Secondary | ICD-10-CM | POA: Insufficient documentation

## 2023-04-10 DIAGNOSIS — I700 Atherosclerosis of aorta: Secondary | ICD-10-CM | POA: Insufficient documentation

## 2023-04-10 DIAGNOSIS — Z955 Presence of coronary angioplasty implant and graft: Secondary | ICD-10-CM | POA: Diagnosis not present

## 2023-04-10 DIAGNOSIS — I2510 Atherosclerotic heart disease of native coronary artery without angina pectoris: Secondary | ICD-10-CM | POA: Insufficient documentation

## 2023-04-10 DIAGNOSIS — Z01818 Encounter for other preprocedural examination: Secondary | ICD-10-CM

## 2023-04-10 LAB — CBC
HCT: 47.4 % (ref 39.0–52.0)
Hemoglobin: 15.7 g/dL (ref 13.0–17.0)
MCH: 30.6 pg (ref 26.0–34.0)
MCHC: 33.1 g/dL (ref 30.0–36.0)
MCV: 92.4 fL (ref 80.0–100.0)
Platelets: 228 10*3/uL (ref 150–400)
RBC: 5.13 MIL/uL (ref 4.22–5.81)
RDW: 12.8 % (ref 11.5–15.5)
WBC: 7.9 10*3/uL (ref 4.0–10.5)
nRBC: 0 % (ref 0.0–0.2)

## 2023-04-10 LAB — BASIC METABOLIC PANEL
Anion gap: 7 (ref 5–15)
BUN: 7 mg/dL — ABNORMAL LOW (ref 8–23)
CO2: 27 mmol/L (ref 22–32)
Calcium: 9.2 mg/dL (ref 8.9–10.3)
Chloride: 106 mmol/L (ref 98–111)
Creatinine, Ser: 0.73 mg/dL (ref 0.61–1.24)
GFR, Estimated: >60 mL/min (ref >60)
Glucose, Bld: 92 mg/dL (ref 70–99)
Potassium: 3.8 mmol/L (ref 3.5–5.1)
Sodium: 140 mmol/L (ref 135–145)

## 2023-04-10 LAB — TYPE AND SCREEN
ABO/RH(D): O POS
Antibody Screen: NEGATIVE

## 2023-04-10 LAB — SURGICAL PCR SCREEN
MRSA, PCR: NEGATIVE
Staphylococcus aureus: POSITIVE — AB

## 2023-04-10 NOTE — Progress Notes (Signed)
PCP - Nicki Reaper, NP Cardiologist - Dr. Lorine Bears  PPM/ICD - denies   Chest x-ray - 06/07/21 EKG - 02/09/23 Stress Test - denies ECHO - 04/26/21 Cardiac Cath - 01/01/17  Sleep Study - denies   DM- denies  Blood Thinner Instructions: n/a Aspirin Instructions: Hold 7 days. Last dose 9/4  ERAS Protcol - yes PRE-SURGERY Ensure given at PAT  COVID TEST- n/a   Anesthesia review: yes, cardiac hx  Patient denies shortness of breath, fever, cough and chest pain at PAT appointment   All instructions explained to the patient, with a verbal understanding of the material. Patient agrees to go over the instructions while at home for a better understanding.  The opportunity to ask questions was provided.

## 2023-04-11 NOTE — Anesthesia Preprocedure Evaluation (Signed)
Anesthesia Evaluation  Patient identified by MRN, date of birth, ID band Patient awake    Reviewed: Allergy & Precautions, NPO status , Patient's Chart, lab work & pertinent test results, reviewed documented beta blocker date and time   Airway Mallampati: III  TM Distance: >3 FB Neck ROM: Full    Dental  (+) Edentulous Upper, Missing, Dental Advisory Given,    Pulmonary COPD, Current Smoker and Patient abstained from smoking.   Pulmonary exam normal breath sounds clear to auscultation       Cardiovascular hypertension, Pt. on medications and Pt. on home beta blockers + CAD, + Past MI and + Peripheral Vascular Disease  Normal cardiovascular exam Rhythm:Regular Rate:Normal  TTE 2022 1. Left ventricular ejection fraction, by estimation, is 60 to 65%. The  left ventricle has normal function. The left ventricle has no regional  wall motion abnormalities. Left ventricular diastolic parameters are  consistent with Grade II diastolic  dysfunction (pseudonormalization).   2. Right ventricular systolic function is normal. The right ventricular  size is normal. Tricuspid regurgitation signal is inadequate for assessing  PA pressure.   3. The mitral valve is grossly normal. Trivial mitral valve  regurgitation. No evidence of mitral stenosis.   4. The aortic valve was not well visualized. Aortic valve regurgitation  is trivial. No aortic stenosis is present.   5. Aortic dilatation noted. There is borderline dilatation of the  ascending aorta, measuring 36 mm.   6. The inferior vena cava is normal in size with greater than 50%  respiratory variability, suggesting right atrial pressure of 3 mmHg.     Neuro/Psych negative neurological ROS  negative psych ROS   GI/Hepatic ,GERD  ,,(+)     substance abuse  alcohol use and marijuana use  Endo/Other  negative endocrine ROS    Renal/GU negative Renal ROS  negative genitourinary    Musculoskeletal  (+) Arthritis ,    Abdominal   Peds  Hematology negative hematology ROS (+)   Anesthesia Other Findings   Reproductive/Obstetrics                             Anesthesia Physical Anesthesia Plan  ASA: 3  Anesthesia Plan: General   Post-op Pain Management: Ofirmev IV (intra-op)* and Ketamine IV*   Induction: Intravenous  PONV Risk Score and Plan: 1 and Midazolam, Dexamethasone and Ondansetron  Airway Management Planned: Oral ETT  Additional Equipment:   Intra-op Plan:   Post-operative Plan: Extubation in OR  Informed Consent: I have reviewed the patients History and Physical, chart, labs and discussed the procedure including the risks, benefits and alternatives for the proposed anesthesia with the patient or authorized representative who has indicated his/her understanding and acceptance.     Dental advisory given  Plan Discussed with: CRNA  Anesthesia Plan Comments: (PAT note by Antionette Poles, PA-C:  Follows cardiology for history of CAD s/p NSTEMI 12/2016 treated with DES to the LAD, HTN, HLD.  Seen by Joni Reining, NP 04/06/2023 for preop evaluation.  Per note, "According to the Revised Cardiac Risk Index (RCRI), his Perioperative Risk of Major Cardiac Event is (%): 0.9. His Functional Capacity in METs is: 5.5 according to the Duke Activity Status Index (DASI).  Activity is limited by chronic back pain, right leg weakness, and weight gain. The patient was advised that if he develops new symptoms prior to surgery to contact our office to arrange for a follow-up visit, and he verbalized  understanding. Per office protocol, if patient is without any new symptoms or concerns at the time of their virtual visit, he/she may hold ASA for 7  days prior to procedure. Please resume ASA as soon as possible postprocedure, at the discretion of the surgeon. "  History of polysubstance abuse.  Denies currently.  Current smoker with associated  COPD.  Continued on Breztri and as needed albuterol.  History of GERD, maintained on omeprazole.  Preop labs reviewed, unremarkable.  TTE 04/27/2021: 1. Left ventricular ejection fraction, by estimation, is 60 to 65%. The  left ventricle has normal function. The left ventricle has no regional  wall motion abnormalities. Left ventricular diastolic parameters are  consistent with Grade II diastolic  dysfunction (pseudonormalization).  2. Right ventricular systolic function is normal. The right ventricular  size is normal. Tricuspid regurgitation signal is inadequate for assessing  PA pressure.  3. The mitral valve is grossly normal. Trivial mitral valve  regurgitation. No evidence of mitral stenosis.  4. The aortic valve was not well visualized. Aortic valve regurgitation  is trivial. No aortic stenosis is present.  5. Aortic dilatation noted. There is borderline dilatation of the  ascending aorta, measuring 36 mm.  6. The inferior vena cava is normal in size with greater than 50%  respiratory variability, suggesting right atrial pressure of 3 mmHg.   Cath and PCI 01/01/2017:  The left ventricular systolic function is normal.  LV end diastolic pressure is mildly elevated.  The left ventricular ejection fraction is 55-65% by visual estimate.  A STENT RESOLUTE ONYX 3.0X18 drug eluting stent was successfully placed.  Prox LAD lesion, 90 %stenosed.  Post intervention, there is a 0% residual stenosis.   1. Severe one-vessel coronary artery disease with 90% thrombotic stenosis in the proximal LAD which is the culprit for non-ST elevation myocardial infarction. No other significant disease. 2. Normal LV systolic function and mildly elevated left ventricular end-diastolic pressure 3. Successful angioplasty and drug-eluting stent placement to the proximal LAD.  Recommendations: Dual antiplatelet therapy for at least one year. Aggressive treatment of risk factors, smoking cessation  in cardiac rehabilitation.  )        Anesthesia Quick Evaluation

## 2023-04-11 NOTE — Progress Notes (Signed)
Anesthesia Chart Review:  Follows cardiology for history of CAD s/p NSTEMI 12/2016 treated with DES to the LAD, HTN, HLD.  Seen by Joni Reining, NP 04/06/2023 for preop evaluation.  Per note, "According to the Revised Cardiac Risk Index (RCRI), his Perioperative Risk of Major Cardiac Event is (%): 0.9. His Functional Capacity in METs is: 5.5 according to the Duke Activity Status Index (DASI).  Activity is limited by chronic back pain, right leg weakness, and weight gain. The patient was advised that if he develops new symptoms prior to surgery to contact our office to arrange for a follow-up visit, and he verbalized understanding. Per office protocol, if patient is without any new symptoms or concerns at the time of their virtual visit, he/she may hold ASA for 7  days prior to procedure. Please resume ASA as soon as possible postprocedure, at the discretion of the surgeon. "  History of polysubstance abuse.  Denies currently.  Current smoker with associated COPD.  Continued on Breztri and as needed albuterol.  History of GERD, maintained on omeprazole.  Preop labs reviewed, unremarkable.  TTE 04/27/2021: 1. Left ventricular ejection fraction, by estimation, is 60 to 65%. The  left ventricle has normal function. The left ventricle has no regional  wall motion abnormalities. Left ventricular diastolic parameters are  consistent with Grade II diastolic  dysfunction (pseudonormalization).   2. Right ventricular systolic function is normal. The right ventricular  size is normal. Tricuspid regurgitation signal is inadequate for assessing  PA pressure.   3. The mitral valve is grossly normal. Trivial mitral valve  regurgitation. No evidence of mitral stenosis.   4. The aortic valve was not well visualized. Aortic valve regurgitation  is trivial. No aortic stenosis is present.   5. Aortic dilatation noted. There is borderline dilatation of the  ascending aorta, measuring 36 mm.   6. The inferior  vena cava is normal in size with greater than 50%  respiratory variability, suggesting right atrial pressure of 3 mmHg.   Cath and PCI 01/01/2017: The left ventricular systolic function is normal. LV end diastolic pressure is mildly elevated. The left ventricular ejection fraction is 55-65% by visual estimate. A STENT RESOLUTE ONYX 3.0X18 drug eluting stent was successfully placed. Prox LAD lesion, 90 %stenosed. Post intervention, there is a 0% residual stenosis.   1. Severe one-vessel coronary artery disease with 90% thrombotic stenosis in the proximal LAD which is the culprit for non-ST elevation myocardial infarction. No other significant disease. 2. Normal LV systolic function and mildly elevated left ventricular end-diastolic pressure 3. Successful angioplasty and drug-eluting stent placement to the proximal LAD.   Recommendations: Dual antiplatelet therapy for at least one year. Aggressive treatment of risk factors, smoking cessation in cardiac rehabilitation.    Zannie Cove [facility]/Anesthesiology Phone [phone] 04/11/2023 8:40 AM

## 2023-04-12 ENCOUNTER — Other Ambulatory Visit: Payer: Self-pay

## 2023-04-12 ENCOUNTER — Ambulatory Visit (HOSPITAL_COMMUNITY): Payer: Self-pay | Admitting: Physician Assistant

## 2023-04-12 ENCOUNTER — Encounter (HOSPITAL_COMMUNITY)
Admission: RE | Disposition: A | Discharge status: Home / Self Care | Payer: Self-pay | Source: Ambulatory Visit | Attending: Orthopedic Surgery

## 2023-04-12 ENCOUNTER — Encounter (HOSPITAL_COMMUNITY): Payer: Self-pay | Admitting: Orthopedic Surgery

## 2023-04-12 ENCOUNTER — Ambulatory Visit (HOSPITAL_COMMUNITY): Payer: Medicare Other

## 2023-04-12 ENCOUNTER — Observation Stay (HOSPITAL_COMMUNITY)
Admission: RE | Admit: 2023-04-12 | Discharge: 2023-04-13 | Disposition: A | Discharge status: Home / Self Care | Payer: Medicare Other | Source: Ambulatory Visit | Attending: Orthopedic Surgery | Admitting: Orthopedic Surgery

## 2023-04-12 ENCOUNTER — Ambulatory Visit (HOSPITAL_BASED_OUTPATIENT_CLINIC_OR_DEPARTMENT_OTHER): Payer: Medicare Other | Admitting: Certified Registered Nurse Anesthetist

## 2023-04-12 DIAGNOSIS — Z79899 Other long term (current) drug therapy: Secondary | ICD-10-CM | POA: Insufficient documentation

## 2023-04-12 DIAGNOSIS — I2510 Atherosclerotic heart disease of native coronary artery without angina pectoris: Secondary | ICD-10-CM | POA: Diagnosis not present

## 2023-04-12 DIAGNOSIS — M5116 Intervertebral disc disorders with radiculopathy, lumbar region: Secondary | ICD-10-CM

## 2023-04-12 DIAGNOSIS — F17210 Nicotine dependence, cigarettes, uncomplicated: Secondary | ICD-10-CM | POA: Diagnosis not present

## 2023-04-12 DIAGNOSIS — Z7982 Long term (current) use of aspirin: Secondary | ICD-10-CM | POA: Insufficient documentation

## 2023-04-12 DIAGNOSIS — M48061 Spinal stenosis, lumbar region without neurogenic claudication: Secondary | ICD-10-CM | POA: Insufficient documentation

## 2023-04-12 DIAGNOSIS — M5416 Radiculopathy, lumbar region: Secondary | ICD-10-CM | POA: Diagnosis not present

## 2023-04-12 DIAGNOSIS — I10 Essential (primary) hypertension: Secondary | ICD-10-CM | POA: Diagnosis not present

## 2023-04-12 HISTORY — PX: TRANSFORAMINAL LUMBAR INTERBODY FUSION (TLIF) WITH PEDICLE SCREW FIXATION 1 LEVEL: SHX6141

## 2023-04-12 SURGERY — TRANSFORAMINAL LUMBAR INTERBODY FUSION (TLIF) WITH PEDICLE SCREW FIXATION 1 LEVEL
Anesthesia: General | Site: Spine Lumbar | Laterality: Right

## 2023-04-12 MED ORDER — CARVEDILOL 6.25 MG PO TABS
6.2500 mg | ORAL_TABLET | Freq: Two times a day (BID) | ORAL | Status: DC
  Administered 2023-04-12 – 2023-04-13 (×2): 6.25 mg via ORAL
  Filled 2023-04-12 (×2): qty 1

## 2023-04-12 MED ORDER — ACETAMINOPHEN 650 MG RE SUPP: 650.0000 mg | RECTAL | Status: DC | PRN

## 2023-04-12 MED ORDER — DEXAMETHASONE SODIUM PHOSPHATE 10 MG/ML IJ SOLN
INTRAMUSCULAR | Status: AC
  Filled 2023-04-12: qty 1

## 2023-04-12 MED ORDER — LIDOCAINE 2% (20 MG/ML) 5 ML SYRINGE
INTRAMUSCULAR | Status: AC
  Filled 2023-04-12: qty 5

## 2023-04-12 MED ORDER — ONDANSETRON HCL 4 MG/2ML IJ SOLN
INTRAMUSCULAR | Status: AC
  Filled 2023-04-12: qty 2

## 2023-04-12 MED ORDER — BISACODYL 5 MG PO TBEC: 5.0000 mg | DELAYED_RELEASE_TABLET | Freq: Every day | ORAL | Status: DC | PRN

## 2023-04-12 MED ORDER — SENNOSIDES-DOCUSATE SODIUM 8.6-50 MG PO TABS: 1.0000 | ORAL_TABLET | Freq: Every evening | ORAL | Status: DC | PRN

## 2023-04-12 MED ORDER — PHENOL 1.4 % MT LIQD: 1.0000 | OROMUCOSAL | Status: DC | PRN

## 2023-04-12 MED ORDER — ONDANSETRON HCL 4 MG PO TABS: 4.0000 mg | ORAL_TABLET | Freq: Four times a day (QID) | ORAL | Status: DC | PRN

## 2023-04-12 MED ORDER — ONDANSETRON HCL 4 MG/2ML IJ SOLN
INTRAMUSCULAR | Status: DC | PRN
  Administered 2023-04-12: 4 mg via INTRAVENOUS

## 2023-04-12 MED ORDER — METHOCARBAMOL 500 MG PO TABS
500.0000 mg | ORAL_TABLET | Freq: Four times a day (QID) | ORAL | Status: DC | PRN
  Administered 2023-04-12: 500 mg via ORAL
  Administered 2023-04-12: 1000 mg via ORAL
  Administered 2023-04-13: 500 mg via ORAL
  Filled 2023-04-12 (×4): qty 1

## 2023-04-12 MED ORDER — ALBUTEROL SULFATE (2.5 MG/3ML) 0.083% IN NEBU: 2.5000 mg | INHALATION_SOLUTION | Freq: Four times a day (QID) | RESPIRATORY_TRACT | Status: DC | PRN

## 2023-04-12 MED ORDER — CHLORHEXIDINE GLUCONATE 0.12 % MT SOLN
15.0000 mL | Freq: Once | OROMUCOSAL | Status: AC
  Administered 2023-04-12: 15 mL via OROMUCOSAL
  Filled 2023-04-12: qty 15

## 2023-04-12 MED ORDER — HYDROMORPHONE HCL 1 MG/ML IJ SOLN
INTRAMUSCULAR | Status: AC
  Filled 2023-04-12: qty 1

## 2023-04-12 MED ORDER — LIDOCAINE 2% (20 MG/ML) 5 ML SYRINGE
INTRAMUSCULAR | Status: DC | PRN
  Administered 2023-04-12: 100 mg via INTRAVENOUS

## 2023-04-12 MED ORDER — MOMETASONE FURO-FORMOTEROL FUM 100-5 MCG/ACT IN AERO
2.0000 | INHALATION_SPRAY | Freq: Two times a day (BID) | RESPIRATORY_TRACT | Status: DC
  Filled 2023-04-12: qty 8.8

## 2023-04-12 MED ORDER — ACETAMINOPHEN 10 MG/ML IV SOLN
INTRAVENOUS | Status: DC | PRN
Start: 2023-04-12 — End: 2023-04-12
  Administered 2023-04-12: 1000 mg via INTRAVENOUS

## 2023-04-12 MED ORDER — ORAL CARE MOUTH RINSE: 15.0000 mL | Freq: Once | OROMUCOSAL | Status: AC

## 2023-04-12 MED ORDER — PHENYLEPHRINE 80 MCG/ML (10ML) SYRINGE FOR IV PUSH (FOR BLOOD PRESSURE SUPPORT)
PREFILLED_SYRINGE | INTRAVENOUS | Status: DC | PRN
Start: 2023-04-12 — End: 2023-04-12
  Administered 2023-04-12: 80 ug via INTRAVENOUS
  Administered 2023-04-12: 160 ug via INTRAVENOUS

## 2023-04-12 MED ORDER — FENTANYL CITRATE (PF) 250 MCG/5ML IJ SOLN
INTRAMUSCULAR | Status: AC
  Filled 2023-04-12: qty 5

## 2023-04-12 MED ORDER — MELATONIN 5 MG PO TABS
10.0000 mg | ORAL_TABLET | Freq: Every day | ORAL | Status: DC
  Administered 2023-04-12: 10 mg via ORAL
  Filled 2023-04-12: qty 2

## 2023-04-12 MED ORDER — ROCURONIUM BROMIDE 10 MG/ML (PF) SYRINGE
PREFILLED_SYRINGE | INTRAVENOUS | Status: DC | PRN
  Administered 2023-04-12: 20 mg via INTRAVENOUS
  Administered 2023-04-12: 30 mg via INTRAVENOUS
  Administered 2023-04-12: 70 mg via INTRAVENOUS
  Administered 2023-04-12: 10 mg via INTRAVENOUS
  Administered 2023-04-12: 20 mg via INTRAVENOUS

## 2023-04-12 MED ORDER — PREGABALIN 75 MG PO CAPS
75.0000 mg | ORAL_CAPSULE | Freq: Two times a day (BID) | ORAL | Status: DC
  Administered 2023-04-12 (×2): 75 mg via ORAL
  Filled 2023-04-12 (×2): qty 1

## 2023-04-12 MED ORDER — LACTATED RINGERS IV SOLN
INTRAVENOUS | Status: DC | PRN
Start: 2023-04-12 — End: 2023-04-12

## 2023-04-12 MED ORDER — MONTELUKAST SODIUM 10 MG PO TABS
10.0000 mg | ORAL_TABLET | Freq: Every day | ORAL | Status: DC
  Administered 2023-04-12: 10 mg via ORAL
  Filled 2023-04-12: qty 1

## 2023-04-12 MED ORDER — POTASSIUM CHLORIDE IN NACL 20-0.9 MEQ/L-% IV SOLN: INTRAVENOUS | Status: DC

## 2023-04-12 MED ORDER — PROPOFOL 10 MG/ML IV BOLUS
INTRAVENOUS | Status: AC
  Filled 2023-04-12: qty 20

## 2023-04-12 MED ORDER — ACETAMINOPHEN 325 MG PO TABS: 650.0000 mg | ORAL_TABLET | ORAL | Status: DC | PRN

## 2023-04-12 MED ORDER — LISINOPRIL 20 MG PO TABS
40.0000 mg | ORAL_TABLET | Freq: Every day | ORAL | Status: DC
  Administered 2023-04-12: 40 mg via ORAL
  Filled 2023-04-12: qty 2

## 2023-04-12 MED ORDER — HYDROMORPHONE HCL 1 MG/ML IJ SOLN
0.2500 mg | INTRAMUSCULAR | Status: DC | PRN
  Administered 2023-04-12 (×4): 0.5 mg via INTRAVENOUS

## 2023-04-12 MED ORDER — GABAPENTIN 300 MG PO CAPS: 300.0000 mg | ORAL_CAPSULE | Freq: Two times a day (BID) | ORAL | Status: DC

## 2023-04-12 MED ORDER — SODIUM CHLORIDE 0.9% FLUSH: 3.0000 mL | INTRAVENOUS | Status: DC | PRN

## 2023-04-12 MED ORDER — DEXTROSE 5 % IV SOLN
INTRAVENOUS | Status: DC | PRN
  Administered 2023-04-12: 3 g via INTRAVENOUS

## 2023-04-12 MED ORDER — CEFAZOLIN SODIUM-DEXTROSE 2-4 GM/100ML-% IV SOLN
2.0000 g | Freq: Three times a day (TID) | INTRAVENOUS | Status: AC
  Administered 2023-04-12 (×2): 2 g via INTRAVENOUS
  Filled 2023-04-12 (×2): qty 100

## 2023-04-12 MED ORDER — ZOLPIDEM TARTRATE 5 MG PO TABS: 5.0000 mg | ORAL_TABLET | Freq: Every evening | ORAL | Status: DC | PRN

## 2023-04-12 MED ORDER — UMECLIDINIUM BROMIDE 62.5 MCG/ACT IN AEPB
1.0000 | INHALATION_SPRAY | Freq: Every day | RESPIRATORY_TRACT | Status: DC
  Filled 2023-04-12: qty 7

## 2023-04-12 MED ORDER — THROMBIN 20000 UNITS EX KIT
PACK | CUTANEOUS | Status: DC | PRN
  Administered 2023-04-12: 20 mL via TOPICAL

## 2023-04-12 MED ORDER — PHENYLEPHRINE HCL-NACL 20-0.9 MG/250ML-% IV SOLN
INTRAVENOUS | Status: DC | PRN
  Administered 2023-04-12: 25 ug/min via INTRAVENOUS

## 2023-04-12 MED ORDER — THROMBIN 20000 UNITS EX SOLR
CUTANEOUS | Status: AC
  Filled 2023-04-12: qty 20000

## 2023-04-12 MED ORDER — DOCUSATE SODIUM 100 MG PO CAPS
100.0000 mg | ORAL_CAPSULE | Freq: Two times a day (BID) | ORAL | Status: DC
  Administered 2023-04-12: 100 mg via ORAL
  Filled 2023-04-12: qty 1

## 2023-04-12 MED ORDER — ALUM & MAG HYDROXIDE-SIMETH 200-200-20 MG/5ML PO SUSP: 30.0000 mL | Freq: Four times a day (QID) | ORAL | Status: DC | PRN

## 2023-04-12 MED ORDER — MIDAZOLAM HCL 2 MG/2ML IJ SOLN
INTRAMUSCULAR | Status: DC | PRN
  Administered 2023-04-12: 2 mg via INTRAVENOUS

## 2023-04-12 MED ORDER — SODIUM CHLORIDE 0.9% FLUSH
3.0000 mL | Freq: Two times a day (BID) | INTRAVENOUS | Status: DC
  Administered 2023-04-12 (×2): 3 mL via INTRAVENOUS

## 2023-04-12 MED ORDER — MENTHOL 3 MG MT LOZG: 1.0000 | LOZENGE | OROMUCOSAL | Status: DC | PRN

## 2023-04-12 MED ORDER — FLEET ENEMA RE ENEM: 1.0000 | ENEMA | Freq: Once | RECTAL | Status: DC | PRN

## 2023-04-12 MED ORDER — 0.9 % SODIUM CHLORIDE (POUR BTL) OPTIME
TOPICAL | Status: DC | PRN
  Administered 2023-04-12: 1000 mL

## 2023-04-12 MED ORDER — SUGAMMADEX SODIUM 200 MG/2ML IV SOLN
INTRAVENOUS | Status: DC | PRN
  Administered 2023-04-12: 200 mg via INTRAVENOUS

## 2023-04-12 MED ORDER — FENTANYL CITRATE (PF) 250 MCG/5ML IJ SOLN
INTRAMUSCULAR | Status: DC | PRN
  Administered 2023-04-12 (×5): 50 ug via INTRAVENOUS

## 2023-04-12 MED ORDER — ROSUVASTATIN CALCIUM 5 MG PO TABS
10.0000 mg | ORAL_TABLET | Freq: Every day | ORAL | Status: DC
  Administered 2023-04-12: 10 mg via ORAL
  Filled 2023-04-12: qty 2

## 2023-04-12 MED ORDER — ONDANSETRON HCL 4 MG/2ML IJ SOLN: 4.0000 mg | Freq: Four times a day (QID) | INTRAMUSCULAR | Status: DC | PRN

## 2023-04-12 MED ORDER — LACTATED RINGERS IV SOLN: INTRAVENOUS | Status: DC

## 2023-04-12 MED ORDER — MORPHINE SULFATE (PF) 2 MG/ML IV SOLN: 1.0000 mg | INTRAVENOUS | Status: DC | PRN

## 2023-04-12 MED ORDER — ACETAMINOPHEN 10 MG/ML IV SOLN
INTRAVENOUS | Status: AC
  Filled 2023-04-12: qty 100

## 2023-04-12 MED ORDER — PANTOPRAZOLE SODIUM 40 MG PO TBEC
80.0000 mg | DELAYED_RELEASE_TABLET | Freq: Every day | ORAL | Status: DC
  Administered 2023-04-12: 80 mg via ORAL
  Filled 2023-04-12: qty 2

## 2023-04-12 MED ORDER — METHOCARBAMOL 1000 MG/10ML IJ SOLN: 500.0000 mg | Freq: Four times a day (QID) | INTRAVENOUS | Status: DC | PRN

## 2023-04-12 MED ORDER — HYDROMORPHONE HCL 1 MG/ML IJ SOLN: 0.2500 mg | INTRAMUSCULAR | Status: DC | PRN

## 2023-04-12 MED ORDER — OXYCODONE-ACETAMINOPHEN 5-325 MG PO TABS
1.0000 | ORAL_TABLET | ORAL | Status: DC | PRN
  Administered 2023-04-12 – 2023-04-13 (×5): 2 via ORAL
  Filled 2023-04-12 (×5): qty 2

## 2023-04-12 MED ORDER — DEXAMETHASONE SODIUM PHOSPHATE 10 MG/ML IJ SOLN
INTRAMUSCULAR | Status: DC | PRN
  Administered 2023-04-12: 10 mg via INTRAVENOUS

## 2023-04-12 MED ORDER — KETAMINE HCL 50 MG/5ML IJ SOSY
PREFILLED_SYRINGE | INTRAMUSCULAR | Status: AC
  Filled 2023-04-12: qty 5

## 2023-04-12 MED ORDER — BUPIVACAINE LIPOSOME 1.3 % IJ SUSP
INTRAMUSCULAR | Status: AC
  Filled 2023-04-12: qty 20

## 2023-04-12 MED ORDER — BUPIVACAINE-EPINEPHRINE (PF) 0.25% -1:200000 IJ SOLN
INTRAMUSCULAR | Status: DC | PRN
  Administered 2023-04-12: 40 mL

## 2023-04-12 MED ORDER — ALBUTEROL SULFATE HFA 108 (90 BASE) MCG/ACT IN AERS: 2.0000 | INHALATION_SPRAY | Freq: Four times a day (QID) | RESPIRATORY_TRACT | Status: DC | PRN

## 2023-04-12 MED ORDER — BUDESON-GLYCOPYRROL-FORMOTEROL 160-9-4.8 MCG/ACT IN AERO: 2.0000 | INHALATION_SPRAY | Freq: Two times a day (BID) | RESPIRATORY_TRACT | Status: DC

## 2023-04-12 MED ORDER — ROCURONIUM BROMIDE 10 MG/ML (PF) SYRINGE
PREFILLED_SYRINGE | INTRAVENOUS | Status: AC
  Filled 2023-04-12: qty 10

## 2023-04-12 MED ORDER — SODIUM CHLORIDE 0.9 % IV SOLN
250.0000 mL | INTRAVENOUS | Status: DC
  Administered 2023-04-12: 250 mL via INTRAVENOUS

## 2023-04-12 MED ORDER — MIDAZOLAM HCL 2 MG/2ML IJ SOLN
INTRAMUSCULAR | Status: AC
  Filled 2023-04-12: qty 2

## 2023-04-12 MED ORDER — PROPOFOL 10 MG/ML IV BOLUS
INTRAVENOUS | Status: DC | PRN
  Administered 2023-04-12: 180 mg via INTRAVENOUS

## 2023-04-12 MED ORDER — BUPIVACAINE-EPINEPHRINE (PF) 0.25% -1:200000 IJ SOLN
INTRAMUSCULAR | Status: AC
  Filled 2023-04-12: qty 30

## 2023-04-12 MED ORDER — AMLODIPINE BESYLATE 5 MG PO TABS
5.0000 mg | ORAL_TABLET | Freq: Every day | ORAL | Status: DC
  Administered 2023-04-12: 5 mg via ORAL
  Filled 2023-04-12: qty 1

## 2023-04-12 MED ORDER — HYDROMORPHONE HCL 1 MG/ML IJ SOLN
INTRAMUSCULAR | Status: AC
  Administered 2023-04-12: 0.5 mg
  Filled 2023-04-12: qty 1

## 2023-04-12 MED ORDER — BUPIVACAINE-EPINEPHRINE 0.25% -1:200000 IJ SOLN
INTRAMUSCULAR | Status: DC | PRN
  Administered 2023-04-12: 8 mL

## 2023-04-12 MED ORDER — HYDROCODONE-ACETAMINOPHEN 5-325 MG PO TABS: 1.0000 | ORAL_TABLET | ORAL | Status: DC | PRN

## 2023-04-12 MED ORDER — POVIDONE-IODINE 7.5 % EX SOLN: Freq: Once | CUTANEOUS | Status: DC

## 2023-04-12 MED ORDER — CEFAZOLIN IN SODIUM CHLORIDE 3-0.9 GM/100ML-% IV SOLN
3.0000 g | INTRAVENOUS | Status: DC
  Filled 2023-04-12: qty 100

## 2023-04-12 MED ORDER — KETAMINE HCL 50 MG/ML IJ SOLN
INTRAMUSCULAR | Status: DC | PRN
Start: 2023-04-12 — End: 2023-04-12
  Administered 2023-04-12: 20 mg via INTRAMUSCULAR
  Administered 2023-04-12 (×3): 10 mg via INTRAMUSCULAR

## 2023-04-12 MED ORDER — HYDROMORPHONE HCL 1 MG/ML IJ SOLN
0.2500 mg | INTRAMUSCULAR | Status: DC | PRN
  Administered 2023-04-12 (×3): 0.5 mg via INTRAVENOUS

## 2023-04-12 SURGICAL SUPPLY — 93 items
AGENT HMST KT MTR STRL THRMB (HEMOSTASIS)
APL SKNCLS STERI-STRIP NONHPOA (GAUZE/BANDAGES/DRESSINGS) ×1
BAG COUNTER SPONGE SURGICOUNT (BAG) ×1 IMPLANT
BAG SPNG CNTER NS LX DISP (BAG) ×1
BENZOIN TINCTURE PRP APPL 2/3 (GAUZE/BANDAGES/DRESSINGS) ×1 IMPLANT
BLADE CLIPPER SURG (BLADE) IMPLANT
BUR PRESCISION 1.7 ELITE (BURR) ×1 IMPLANT
BUR ROUND FLUTED 5 RND (BURR) ×1 IMPLANT
BUR ROUND PRECISION 4.0 (BURR) IMPLANT
BUR SABER RD CUTTING 3.0 (BURR) IMPLANT
CAGE SABLE 10X30 6-12 8D (Cage) IMPLANT
CANNULA GRAFT BNE VG PRE-FILL (Bone Implant) IMPLANT
CNTNR URN SCR LID CUP LEK RST (MISCELLANEOUS) ×1 IMPLANT
CONT SPEC 4OZ STRL OR WHT (MISCELLANEOUS) ×1
COVER BACK TABLE 60X90IN (DRAPES) ×1 IMPLANT
COVER MAYO STAND STRL (DRAPES) ×2 IMPLANT
COVER SURGICAL LIGHT HANDLE (MISCELLANEOUS) ×1 IMPLANT
DISPENSER GRAFT BNE VG (MISCELLANEOUS) IMPLANT
DISPENSER VIVIGEN BONE GRAFT (MISCELLANEOUS) ×1
DRAIN CHANNEL 15F RND FF W/TCR (WOUND CARE) IMPLANT
DRAPE C-ARM 42X72 X-RAY (DRAPES) ×1 IMPLANT
DRAPE C-ARMOR (DRAPES) IMPLANT
DRAPE POUCH INSTRU U-SHP 10X18 (DRAPES) ×1 IMPLANT
DRAPE SURG 17X23 STRL (DRAPES) ×4 IMPLANT
DURAPREP 26ML APPLICATOR (WOUND CARE) ×1 IMPLANT
ELECT BLADE 4.0 EZ CLEAN MEGAD (MISCELLANEOUS) ×1
ELECT CAUTERY BLADE 6.4 (BLADE) ×1 IMPLANT
ELECT REM PT RETURN 9FT ADLT (ELECTROSURGICAL) ×1
ELECTRODE BLDE 4.0 EZ CLN MEGD (MISCELLANEOUS) ×1 IMPLANT
ELECTRODE REM PT RTRN 9FT ADLT (ELECTROSURGICAL) ×1 IMPLANT
EVACUATOR SILICONE 100CC (DRAIN) IMPLANT
FILTER STRAW FLUID ASPIR (MISCELLANEOUS) ×1 IMPLANT
FUNNEL BONE 013 GL (ORTHOPEDIC DISPOSABLE SUPPLIES) IMPLANT
GAUZE 4X4 16PLY ~~LOC~~+RFID DBL (SPONGE) ×1 IMPLANT
GAUZE SPONGE 4X4 12PLY STRL (GAUZE/BANDAGES/DRESSINGS) ×1 IMPLANT
GLOVE BIO SURGEON STRL SZ 6.5 (GLOVE) ×1 IMPLANT
GLOVE BIO SURGEON STRL SZ8 (GLOVE) ×1 IMPLANT
GLOVE BIOGEL PI IND STRL 7.0 (GLOVE) ×1 IMPLANT
GLOVE BIOGEL PI IND STRL 8 (GLOVE) ×1 IMPLANT
GLOVE SURG ENC MOIS LTX SZ6.5 (GLOVE) ×1 IMPLANT
GOWN STRL REUS W/ TWL LRG LVL3 (GOWN DISPOSABLE) ×2 IMPLANT
GOWN STRL REUS W/ TWL XL LVL3 (GOWN DISPOSABLE) ×1 IMPLANT
GOWN STRL REUS W/TWL LRG LVL3 (GOWN DISPOSABLE) ×2
GOWN STRL REUS W/TWL XL LVL3 (GOWN DISPOSABLE) ×1
GRAFT BONE CANNULA VIVIGEN 3 (Bone Implant) ×2 IMPLANT
IV CATH 14GX2 1/4 (CATHETERS) ×1 IMPLANT
KIT BASIN OR (CUSTOM PROCEDURE TRAY) ×1 IMPLANT
KIT POSITION SURG JACKSON T1 (MISCELLANEOUS) ×1 IMPLANT
KIT TURNOVER KIT B (KITS) ×1 IMPLANT
MARKER SKIN DUAL TIP RULER LAB (MISCELLANEOUS) ×2 IMPLANT
NDL 18GX1X1/2 (RX/OR ONLY) (NEEDLE) ×1 IMPLANT
NDL 22X1.5 STRL (OR ONLY) (MISCELLANEOUS) ×2 IMPLANT
NDL HYPO 25GX1X1/2 BEV (NEEDLE) ×1 IMPLANT
NDL SPNL 18GX3.5 QUINCKE PK (NEEDLE) ×2 IMPLANT
NEEDLE 18GX1X1/2 (RX/OR ONLY) (NEEDLE) ×1
NEEDLE 22X1.5 STRL (OR ONLY) (MISCELLANEOUS) ×2
NEEDLE HYPO 25GX1X1/2 BEV (NEEDLE) ×1
NEEDLE SPNL 18GX3.5 QUINCKE PK (NEEDLE) ×2
NS IRRIG 1000ML POUR BTL (IV SOLUTION) ×1 IMPLANT
PACK LAMINECTOMY ORTHO (CUSTOM PROCEDURE TRAY) ×1 IMPLANT
PACK UNIVERSAL I (CUSTOM PROCEDURE TRAY) ×1 IMPLANT
PAD ARMBOARD 7.5X6 YLW CONV (MISCELLANEOUS) ×2 IMPLANT
PATTIES SURGICAL .5 X1 (DISPOSABLE) ×1 IMPLANT
PATTIES SURGICAL .5X1.5 (GAUZE/BANDAGES/DRESSINGS) ×1 IMPLANT
PUTTY DBX 2.5CC (Putty) ×1 IMPLANT
PUTTY DBX 2.5CC DEPUY (Putty) IMPLANT
ROD PRE BENT EXP 40MM (Rod) IMPLANT
ROD PRE BENT EXPEDIUM 35MM (Rod) IMPLANT
SCREW SET SINGLE INNER (Screw) IMPLANT
SCREW VIPER 7MMX30MM CORTICAL (Screw) IMPLANT
SPONGE INTESTINAL PEANUT (DISPOSABLE) ×1 IMPLANT
SPONGE SURGIFOAM ABS GEL 100 (HEMOSTASIS) ×1 IMPLANT
STRIP CLOSURE SKIN 1/2X4 (GAUZE/BANDAGES/DRESSINGS) ×2 IMPLANT
SURGIFLO W/THROMBIN 8M KIT (HEMOSTASIS) IMPLANT
SUT ETHILON 2 0 PSLX (SUTURE) IMPLANT
SUT MNCRL AB 4-0 PS2 18 (SUTURE) ×1 IMPLANT
SUT VIC AB 0 CT1 18XCR BRD 8 (SUTURE) ×1 IMPLANT
SUT VIC AB 0 CT1 8-18 (SUTURE) ×1
SUT VIC AB 1 CT1 18XCR BRD 8 (SUTURE) ×1 IMPLANT
SUT VIC AB 1 CT1 8-18 (SUTURE) ×1
SUT VIC AB 2-0 CT2 18 VCP726D (SUTURE) ×1 IMPLANT
SYR 20ML LL LF (SYRINGE) ×2 IMPLANT
SYR BULB IRRIG 60ML STRL (SYRINGE) ×1 IMPLANT
SYR CONTROL 10ML LL (SYRINGE) ×2 IMPLANT
SYR TB 1ML LUER SLIP (SYRINGE) ×1 IMPLANT
TAP EXPEDIUM DL 4.35 (INSTRUMENTS) IMPLANT
TAP EXPEDIUM DL 5.0 (INSTRUMENTS) IMPLANT
TAP EXPEDIUM DL 6.0 (INSTRUMENTS) IMPLANT
TAPE CLOTH SURG 4X10 WHT LF (GAUZE/BANDAGES/DRESSINGS) IMPLANT
TRAY FOLEY MTR SLVR 16FR STAT (SET/KITS/TRAYS/PACK) ×1 IMPLANT
TUBE FUNNEL GL DISP (ORTHOPEDIC DISPOSABLE SUPPLIES) IMPLANT
WATER STERILE IRR 1000ML POUR (IV SOLUTION) ×1 IMPLANT
YANKAUER SUCT BULB TIP NO VENT (SUCTIONS) ×1 IMPLANT

## 2023-04-12 NOTE — Anesthesia Procedure Notes (Signed)
Procedure Name: Intubation Date/Time: 04/12/2023 7:58 AM  Performed by: Shary Decamp, CRNAPre-anesthesia Checklist: Patient identified, Patient being monitored, Timeout performed, Emergency Drugs available and Suction available Patient Re-evaluated:Patient Re-evaluated prior to induction Oxygen Delivery Method: Circle System Utilized Preoxygenation: Pre-oxygenation with 100% oxygen Induction Type: IV induction Ventilation: Mask ventilation without difficulty Laryngoscope Size: Miller and 3 Grade View: Grade I Tube type: Oral Tube size: 8.0 mm Number of attempts: 1 Airway Equipment and Method: Stylet Placement Confirmation: ETT inserted through vocal cords under direct vision, positive ETCO2 and breath sounds checked- equal and bilateral Secured at: 22 cm Tube secured with: Tape Dental Injury: Teeth and Oropharynx as per pre-operative assessment

## 2023-04-12 NOTE — H&P (Signed)
PREOPERATIVE H&P  Chief Complaint: Right leg pain  HPI: Bryan Ibarra. is a 67 y.o. male who presents with ongoing pain in the right leg  MRI reveals a disc herniation involving the right lateral recess and foramen at L4/5   Patient has failed multiple forms of conservative care and continues to have pain (see office notes for additional details regarding the patient's full course of treatment)  Past Medical History:  Diagnosis Date   Alcohol abuse    a. up to a 12 pack of beer/day. (hx of, not currently) 04/10/23   Arthritis    CAD in native artery    a. LHC 6/18: severe 1 vessel CAD with 90% thrombotic stenosis in the proximal LAD which was felt to be the culprit for the patient's non-STEMI. He underwent successful PCI/DES with a resolute onyx drug-eluting stent (3.0 x 18 mm). There was 0% residual stenosis. LVEF estimated at 55-65% by visual estimate. LVEDP was mildly elevated   Centrilobular emphysema (HCC)    Dyspnea    ED (erectile dysfunction)    GERD (gastroesophageal reflux disease)    H/O echocardiogram    a. echo 6/18: EF 60-65%, normal wall motion, grade 1 diastolic dysfunction, RV cavity size was normal with normal wall thickness and normal RV systolic function. There were no significant valvular abnormalities   Hearing loss    HLD (hyperlipidemia)    Hypertension    Marijuana abuse    hx of. Not currently (04/10/23)   NSTEMI (non-ST elevated myocardial infarction) (HCC)    a. LHC 6/18: severe 1 vessel CAD with 90% thrombotic stenosis in the proximal LAD which was felt to be the culprit for the patient's non-STEMI. He underwent successful PCI/DES with a resolute onyx drug-eluting stent (3.0 x 18 mm). There was 0% residual stenosis. LVEF estimated at 55-65% by visual estimate. LVEDP was mildly elevated   Senile purpura (HCC)    Spinal stenosis    Tobacco abuse    Past Surgical History:  Procedure Laterality Date   APPENDECTOMY     COLONOSCOPY     CORONARY  STENT INTERVENTION N/A 01/01/2017   Procedure: Coronary Stent Intervention;  Surgeon: Iran Ouch, MD;  Location: [facility] INVASIVE CV LAB;  Service: Cardiovascular;  Laterality: N/A;   KNEE SURGERY Left    x2 (repairs)   LEFT HEART CATH AND CORONARY ANGIOGRAPHY N/A 01/01/2017   Procedure: Left Heart Cath and Coronary Angiography;  Surgeon: Iran Ouch, MD;  Location: [facility] INVASIVE CV LAB;  Service: Cardiovascular;  Laterality: N/A;   NASAL ENDOSCOPY N/A 08/02/2019   Procedure: NASAL ENDOSCOPY;  Surgeon: Tamela Gammon, MD;  Location: [facility] ORS;  Service: ENT;  Laterality: N/A;   NASAL HEMORRHAGE CONTROL N/A 08/02/2019   Procedure: EPISTAXIS CONTROL;  Surgeon: Tamela Gammon, MD;  Location: [facility] ORS;  Service: ENT;  Laterality: N/A;   Social History   Socioeconomic History   Marital status: Divorced    Spouse name: Not on file   Number of children: 1   Years of education: Not on file   Highest education level: High school graduate  Occupational History   Not on file  Tobacco Use   Smoking status: Every Day    Current packs/day: 1.00    Average packs/day: 1 pack/day for 42.0 years (42.0 ttl pk-yrs)    Types: Cigarettes   Smokeless tobacco: Never   Tobacco comments:    About 8 cigarettes per day (04/10/23)  Vaping Use  Vaping status: Never Used  Substance and Sexual Activity   Alcohol use: Not Currently   Drug use: Not Currently   Sexual activity: Yes  Other Topics Concern   Not on file  Social History Narrative   Lives in [place].  He lives in a mobile home in the backyard of his girlfriend.  He works in Aeronautical engineer.   Social Determinants of Health   Financial Resource Strain: Not on file  Food Insecurity: Not on file  Transportation Needs: Not on file  Physical Activity: Not on file  Stress: Not on file  Social Connections: Not on file   Family History  Problem Relation Age of Onset   Osteoporosis Mother    CAD Father        s/p CABG in his 26's or 67's.    Diabetes Father    Colon polyps Brother    CAD Paternal Grandfather    No Known Allergies Prior to Admission medications   Medication Sig Start Date End Date Taking? Authorizing Provider  albuterol (VENTOLIN HFA) 108 (90 Base) MCG/ACT inhaler Inhale 2 puffs into the lungs every 6 (six) hours as needed for wheezing or shortness of breath. 04/05/23  Yes Baity, Salvadore Oxford, NP  amLODipine (NORVASC) 5 MG tablet TAKE 1 TABLET (5 MG TOTAL) BY MOUTH DAILY. Patient taking differently: Take 5 mg by mouth at bedtime. 12/11/22  Yes Lorre Munroe, NP  aspirin EC 81 MG tablet Take 1 tablet (81 mg total) by mouth daily. Hold for 3 days. Patient taking differently: Take 81 mg by mouth at bedtime. 08/02/19  Yes Ackall, Ardine Eng, MD  Aspirin-Salicylamide-Caffeine (BC HEADACHE PO) Take 1 packet by mouth daily as needed (pain). Patient not taking: Reported on 04/05/2023   Yes [provider]  Budeson-Glycopyrrol-Formoterol (BREZTRI AEROSPHERE) 160-9-4.8 MCG/ACT AERO Inhale 2 puffs into the lungs 2 (two) times daily. 04/05/23  Yes Baity, Salvadore Oxford, NP  carvedilol (COREG) 6.25 MG tablet TAKE 1 TABLET(6.25 MG) BY MOUTH TWICE DAILY WITH A MEAL Patient taking differently: Take 6.25 mg by mouth at bedtime. TAKE 1 TABLET(6.25 MG) BY MOUTH TWICE DAILY WITH A MEAL 10/18/22  Yes Baity, Salvadore Oxford, NP  cyclobenzaprine (FLEXERIL) 5 MG tablet Take 1-2 tablets (5-10 mg total) by mouth 3 (three) times daily as needed. 03/23/23  Yes   Emollient (DERMEND FRAGILE SKIN EX) Apply 1 Application topically daily as needed (bruising).   Yes [provider]  HYDROcodone-acetaminophen (NORCO/VICODIN) 5-325 MG tablet Take 1 tablet by mouth every 6 (six) to 8 (eight) hours as needed. 03/23/23  Yes   lisinopril (ZESTRIL) 40 MG tablet TAKE 1 TABLET(40 MG) BY MOUTH DAILY//NEED FOLLOW UP APPOINTMENT SCHEDULED PRIOR TO 90 DAY SUPPLY Patient taking differently: Take 40 mg by mouth at bedtime. 01/18/23  Yes Iran Ouch, MD  Melatonin 10  MG TABS Take 10 mg by mouth at bedtime.   Yes [provider]  montelukast (SINGULAIR) 10 MG tablet TAKE 1 TABLET BY MOUTH EVERYDAY AT BEDTIME 04/06/23  Yes Baity, Salvadore Oxford, NP  omeprazole (PRILOSEC) 40 MG capsule TAKE 1 CAPSULE (40 MG TOTAL) BY MOUTH DAILY. 04/05/23  Yes Baity, Salvadore Oxford, NP  pregabalin (LYRICA) 75 MG capsule Take 1 capsule (75 mg total) by mouth 2 (two) times daily. 03/26/23  Yes   rosuvastatin (CRESTOR) 10 MG tablet TAKE 1 TABLET BY MOUTH EVERY DAY Patient taking differently: Take 10 mg by mouth at bedtime. 12/18/22  Yes Lorre Munroe, NP  gabapentin (NEURONTIN) 300 MG capsule  Take 1 capsule (300 mg total) by mouth 2 (two) times daily. Patient not taking: Reported on 04/03/2023 11/16/22   Lorre Munroe, NP     All other systems have been reviewed and were otherwise negative with the exception of those mentioned in the HPI and as above.  Physical Exam: Vitals:   04/12/23 0544  BP: (!) 160/100  Pulse: 78  Resp: 18  Temp: 97.6 F (36.4 C)  SpO2: 96%    Body mass index is 34.02 kg/m.  General: Alert, no acute distress Cardiovascular: No pedal edema Respiratory: No cyanosis, no use of accessory musculature Skin: No lesions in the area of chief complaint Neurologic: Sensation intact distally Psychiatric: Patient is competent for consent with normal mood and affect Lymphatic: No axillary or cervical lymphadenopathy   Assessment/Plan: L4/5 STENOSIS DUE TO A DISC HERNIATION SPANNING THE RIGHT L4/5 LATERAL RECESS AND FORAMINAL REGION Plan for Procedure(s): RIGHT-SIDED LUMBAR 4 - LUMBAR 5 TRANSFORAMINAL LUMBAR INTERBODY FUSION WITH INSTRUMENTATION AND ALLOGRAFT   Jackelyn Hoehn, MD 04/12/2023 6:41 AM

## 2023-04-12 NOTE — Op Note (Signed)
PATIENT NAME: Bryan Ibarra.   MEDICAL RECORD NO.:   [id number]   DATE OF BIRTH: July 12, 1956   DATE OF PROCEDURE: 04/12/2023                               OPERATIVE REPORT     PREOPERATIVE DIAGNOSES: 1. Right-sided lumbar radiculopathy 2. L4-5 spinal stenosis due to a disc herniation affecting the right lateral recess and foraminal region 3. L4-5 degenerative disc disease   POSTOPERATIVE DIAGNOSES: 1. Right-sided lumbar radiculopathy 2. L4-5 spinal stenosis due to a disc herniation affecting the right lateral recess and foraminal region 3. L4-5 degenerative disc disease   PROCEDURES: 1. L4/5 decompression 2. Right-sided L4-5 transforaminal lumbar interbody fusion. 3. Left-sided L4-5 posterolateral fusion. 4. Insertion of interbody device x1 (Globus expandable intervertebral spacer). 5. Placement of segmental posterior instrumentation L4, L5 bilaterally  6. Use of local autograft. 7. Use of morselized allograft - Vivigen 8. Intraoperative use of fluoroscopy.   SURGEON:  Estill Bamberg, MD.   ASSISTANTJason Coop, PA-C.   ANESTHESIA:  General endotracheal anesthesia.   COMPLICATIONS:  None.   DISPOSITION:  Stable.   ESTIMATED BLOOD LOSS:  100cc   INDICATIONS FOR SURGERY:  Briefly, Mr. Beisel is a pleasant 67 year old male who did present to me with severe and ongoing pain in the right leg. I did feel that the symptoms were secondary to the findings noted above.   The patient failed conservative care and did wish to proceed with the procedure  noted above.   OPERATIVE DETAILS:  On 04/12/2023, the patient was brought to surgery and general endotracheal anesthesia was administered.  The patient was placed prone on a well-padded flat Jackson bed with a spinal frame.  Antibiotics were given and a time-out procedure was performed. The back was prepped and draped in the usual fashion.  A midline incision was made overlying the L4-5 intervertebral spaces.  The  fascia was incised at the midline.  The paraspinal musculature was bluntly swept laterally.  Anatomic landmarks for the pedicles were exposed. Using fluoroscopy, I did cannulate the L4 and L5 pedicles bilaterally, using a medial to lateral cortical trajectory technique.  At this point, 7 x 30 mm screws were placed into the left pedicles, and a 40 mm rod was placed into the tulip heads of the screw, and caps were also placed.  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  Distraction was then applied across the L4-5 intervertebral space, and the caps were then provisionally tightened.  On the right side, bone wax was placed into the cannulated pedicle holes.  I then proceeded with the decompressive aspect of the procedure at the L4-5 level.  A partial facetectomy was performed bilaterally at L4-5.  A broad-based disc herniation was noted affecting the right lateral recess and foraminal region.  The herniation was removed in its entirety, thoroughly decompressing the L4-5 intervertebral space.  I was very pleased with the decompression. With an assistant holding medial retraction of the traversing right L5 nerve, I did perform an annulotomy at the posterolateral aspect of the L4-5 intervertebral space.  I then used a series of curettes and pituitary rongeurs to perform a thorough and complete intervertebral diskectomy.  The intervertebral space was then liberally packed with autograft as well as allograft in the form of Vivigen, as was the appropriate-sized intervertebral spacer.  The spacer was then tamped into position in the  usual fashion, and expanded to 9.8 mm in height. I was very pleased with the press-fit of the spacer.  I then placed 7 mm screws on the right at L4 and L5. A 40-mm rod was then placed and caps were placed. The distraction was then released on the contralateral side.  All caps were then locked.  The wound was copiously irrigated with a total of approximately 3 L  prior to placing the bone graft.  Additional autograft and allograft was then packed into the posterolateral gutter on the left side to help aid in the success of the fusion.  The wound was  explored for any undue bleeding and there was no substantial bleeding encountered.  Gel-Foam was placed over the laminectomy site.  The wound was then closed in layers using #1 Vicryl followed by 2-0 Vicryl, followed by 4-0 Monocryl.  Benzoin and Steri-Strips were applied followed by sterile dressing.     Of note, Jason Coop was my assistant throughout surgery, and did aid in retraction, suctioning, the decompression, placement of the hardware, and closure.       Estill Bamberg, MD

## 2023-04-12 NOTE — Transfer of Care (Signed)
Immediate Anesthesia Transfer of Care Note  Patient: Bryan Ibarra.  Procedure(s) Performed: RIGHT-SIDED LUMBAR 4 - LUMBAR 5 TRANSFORAMINAL LUMBAR INTERBODY FUSION WITH INSTRUMENTATION AND ALLOGRAFT (Right: Spine Lumbar)  Patient Location: PACU  Anesthesia Type:General  Level of Consciousness: awake, alert , oriented, and patient cooperative  Airway & Oxygen Therapy: Patient Spontanous Breathing and Patient connected to face mask oxygen  Post-op Assessment: Report given to RN, Post -op Vital signs reviewed and stable, and Patient moving all extremities X 4  Post vital signs: Reviewed and stable  Last Vitals:  Vitals Value Taken Time  BP 131/94 04/12/23 1131  Temp    Pulse 88 04/12/23 1134  Resp 17 04/12/23 1134  SpO2 98 % 04/12/23 1134  Vitals shown include unfiled device data.  Last Pain:  Vitals:   04/12/23 0615  PainSc: 6          Complications: No notable events documented.

## 2023-04-12 NOTE — Anesthesia Postprocedure Evaluation (Signed)
Anesthesia Post Note  Patient: Zaidan Eschbach.  Procedure(s) Performed: RIGHT-SIDED LUMBAR 4 - LUMBAR 5 TRANSFORAMINAL LUMBAR INTERBODY FUSION WITH INSTRUMENTATION AND ALLOGRAFT (Right: Spine Lumbar)     Patient location during evaluation: PACU Anesthesia Type: General Level of consciousness: awake and alert Pain management: pain level controlled Vital Signs Assessment: post-procedure vital signs reviewed and stable Respiratory status: spontaneous breathing, nonlabored ventilation, respiratory function stable and patient connected to nasal cannula oxygen Cardiovascular status: blood pressure returned to baseline and stable Postop Assessment: no apparent nausea or vomiting Anesthetic complications: no  No notable events documented.  Last Vitals:  Vitals:   04/12/23 1400 04/12/23 1535  BP: 137/86 (!) 127/97  Pulse: 89 84  Resp: 20 20  Temp: 36.7 C 36.8 C  SpO2: 95% 96%    Last Pain:  Vitals:   04/12/23 1535  TempSrc: Oral  PainSc:    Pain Goal: Patients Stated Pain Goal: 5 (04/12/23 1300)                 Smokey Melott L Lonia Roane

## 2023-04-13 ENCOUNTER — Other Ambulatory Visit (HOSPITAL_COMMUNITY): Payer: Self-pay

## 2023-04-13 ENCOUNTER — Encounter (HOSPITAL_COMMUNITY): Payer: Self-pay | Admitting: Orthopedic Surgery

## 2023-04-13 DIAGNOSIS — M5416 Radiculopathy, lumbar region: Secondary | ICD-10-CM | POA: Diagnosis not present

## 2023-04-13 MED ORDER — OXYCODONE-ACETAMINOPHEN 5-325 MG PO TABS
1.0000 | ORAL_TABLET | Freq: Four times a day (QID) | ORAL | 0 refills | Status: DC | PRN
  Filled 2023-04-13: qty 60, 8d supply, fill #0

## 2023-04-13 MED ORDER — METHOCARBAMOL 500 MG PO TABS
500.0000 mg | ORAL_TABLET | Freq: Four times a day (QID) | ORAL | 0 refills | Status: DC | PRN
Start: 2023-04-13 — End: 2023-06-22
  Filled 2023-04-13: qty 60, 8d supply, fill #0

## 2023-04-13 NOTE — Care Management Obs Status (Signed)
MEDICARE OBSERVATION STATUS NOTIFICATION   Patient Details  Name: Bryan Ibarra. MRN: [id number] Date of Birth: Sep 15, 1955   Medicare Observation Status Notification Given:  Yes    Kermit Balo, RN 04/13/2023, 7:56 AM

## 2023-04-13 NOTE — Progress Notes (Signed)
Patient alert and oriented, voiding adequately, skin clean, dry and intact without evidence of skin break down, or symptoms of complications - no redness or edema noted, only slight tenderness at site.  Patient states pain is manageable at time of discharge. Patient has an appointment with MD in 2 weeks

## 2023-04-13 NOTE — Evaluation (Signed)
Occupational Therapy Evaluation and Discharge Patient Details Name: Bryan Ibarra. MRN: [id number] DOB: [birth year] Today's Date: 04/13/2023   History of Present Illness Pt is a 67 yo male s/p L4/5 decompression, right-sided L4-5 transforaminal lumbar interbody fusion, and left-sided L4-5 posterolateral fusion due to ongoing pain in the right leg and MRI revealing a disc herniation involving the right lateral recess and foramen at L4/5.   Clinical Impression   This 67 yo male admitted and underwent above presents to acute OT with all education completed and post op back handout provided. We will D/C from acute OT.       If plan is discharge home, recommend the following: Assistance with cooking/housework;Assist for transportation    Functional Status Assessment  Patient has had a recent decline in their functional status and demonstrates the ability to make significant improvements in function in a reasonable and predictable amount of time. (without further need for skilled OT)  Equipment Recommendations  None recommended by OT    Recommendations for Other Services       Precautions / Restrictions Precautions Precautions: Back Required Braces or Orthoses: Spinal Brace Spinal Brace: Thoracolumbosacral orthotic;Applied in sitting position;Applied in standing position Restrictions Weight Bearing Restrictions: No      Mobility Bed Mobility               General bed mobility comments: Pt sitting on EOB upon arrival    Transfers Overall transfer level: Modified independent Equipment used: Rolling walker (2 wheels)                      Balance Overall balance assessment: Mild deficits observed, not formally tested                                         ADL either performed or assessed with clinical judgement   ADL Overall ADL's : Needs assistance/impaired Eating/Feeding: Independent;Sitting     Grooming Details (indicate cue type and  reason): Educated on use of 2 cups for brushing teeth Upper Body Bathing: Independent;Sitting   Lower Body Bathing: Modified independent;Sit to/from stand Lower Body Bathing Details (indicate cue type and reason): Educated on crossing legs to get to feet Upper Body Dressing : Independent;Sitting   Lower Body Dressing: Modified independent;Sit to/from stand Lower Body Dressing Details (indicate cue type and reason): Educated on crossing legs to get shorts on and socks (if he wears them--currently wearing flip flops) Toilet Transfer: Modified Independent Toilet Transfer Details (indicate cue type and reason): Educated on sit<>stand stance that keeps back more straight Toileting- Architect and Hygiene: Modified independent Toileting - Clothing Manipulation Details (indicate cue type and reason): Educated on use of wet wipes for back peri care Tub/ Engineer, structural: Tub Metallurgist Details (indicate cue type and reason): Educated on side stepping into tub bending knee back not hip up   General ADL Comments: Educated on limiting sitting time to 20-30 minutes to start and building up to one hour--get up and walk around do not lay down necessairly. Pt Mod I with donning brace sitting EOB--educated on tightening brace when he stands up. Educated on the sequence of dressing.     Vision Patient Visual Report: No change from baseline              Pertinent Vitals/Pain Pain Assessment Pain Assessment: 0-10 Pain Score: 7  Pain Location: incisonal  Pain Descriptors / Indicators: Aching, Sore Pain Intervention(s): Limited activity within patient's tolerance, Monitored during session, Repositioned     Extremity/Trunk Assessment Upper Extremity Assessment Upper Extremity Assessment: Overall WFL for tasks assessed           Communication Communication Communication: No apparent difficulties   Cognition Arousal: Alert Behavior During Therapy: WFL for tasks  assessed/performed Overall Cognitive Status: Within Functional Limits for tasks assessed                                                  Home Living Family/patient expects to be discharged to:: Private residence Living Arrangements: Spouse/significant other Available Help at Discharge: Family;Available PRN/intermittently Type of Home: House Home Access: Stairs to enter [employer] of Steps: 4 Entrance Stairs-Rails: Right;Left;Can reach both Home Layout: One level     Bathroom Shower/Tub: Tub/shower unit;Curtain   Firefighter: Standard     Home Equipment: Shower seat;Hand held shower head;Grab bars - tub/shower;Adaptive equipment Adaptive Equipment: Reacher        Prior Functioning/Environment Prior Level of Function : Independent/Modified Independent                        OT Problem List: Decreased range of motion;Impaired balance (sitting and/or standing);Pain         OT Goals(Current goals can be found in the care plan section) Acute Rehab OT Goals Patient Stated Goal: home today         AM-PAC OT "6 Clicks" Daily Activity     Outcome Measure Help from another person eating meals?: None Help from another person taking care of personal grooming?: None Help from another person toileting, which includes using toliet, bedpan, or urinal?: None Help from another person bathing (including washing, rinsing, drying)?: None Help from another person to put on and taking off regular upper body clothing?: None Help from another person to put on and taking off regular lower body clothing?: None 6 Click Score: 24   End of Session Equipment Utilized During Treatment: Rolling walker (2 wheels);Back brace Nurse Communication:  (no further OT needs)  Activity Tolerance: Patient tolerated treatment well Patient left:  (sitting EOB eating breakfast)  OT Visit Diagnosis: Other abnormalities of gait and mobility (R26.89);Pain Pain -  part of body:  (incisional)                Time: 9147-8295 OT Time Calculation (min): 20 min Charges:  OT General Charges $OT Visit: 1 Visit OT Evaluation $OT Eval Moderate Complexity: 1 Mod  Cathy L. OT Acute Rehabilitation Services Office [phone]    Evette Georges 04/13/2023, 9:09 AM

## 2023-04-13 NOTE — Care Management CC44 (Signed)
Condition Code 44 Documentation Completed  Patient Details  Name: Bryan Ibarra. MRN: [id number] Date of Birth: [birth year]   Condition Code 44 given:  Yes Patient signature on Condition Code 44 notice:  Yes Documentation of 2 MD's agreement:  Yes Code 44 added to claim:  Yes    Kermit Balo, RN 04/13/2023, 7:56 AM

## 2023-04-13 NOTE — Progress Notes (Signed)
    Patient doing well PO day one, minimal well controlled pain in back, very pleased with outcomes thus far, eager to progress home. He has been up and walking and NL B/B habits, good appetite.    Physical Exam: Vitals:   04/13/23 0319 04/13/23 0656  BP: (!) 156/89 (!) 128/93  Pulse: 79 78  Resp: 18   Temp:  98.8 F (37.1 C)  SpO2: 97% 96%    Dressing in place, CDI, pt sitting up at edge of bed comfortably eating. Drain in place, [employer] only 45 since OR, 25 since midnight  POD #1 s/p Lumbar decompression and fusion, doing well  - up with PT/OT, encourage ambulation - Percocet for pain, Robaxin for muscle spasms  -Sent to [facility] of care pharm to deliver to room prior to D/C - d/c home today with f/u in 2 weeks  -Pull drain prior to D/C post PT/OT, reinforce dressing

## 2023-04-13 NOTE — Evaluation (Signed)
Physical Therapy Evaluation Patient Details Name: Bryan Ibarra. MRN: [id number] DOB: 15-Jul-1956 Today's Date: 04/13/2023  History of Present Illness  Pt is a 67 yo male s/p L4/5 decompression, right-sided L4-5 transforaminal lumbar interbody fusion, and left-sided L4-5 posterolateral fusion due to ongoing pain in the right leg and MRI revealing a disc herniation involving the right lateral recess and foramen at L4/5.  PMH includes: past alcohol abuse, arthritis, CAD, HLD, HTN, NSTEMI, ans dyspnea.   Clinical Impression  Pt in bed upon arrival of PT, agreeable to evaluation at this time. Prior to admission the pt was independent, but reports multiple episodes of R knee buckling due to weakness and pain. Pt able to demo good ability for transfers without physical assistance, but is dependent on UE support and will benefit from continued skilled PT to progress functional strength and power in BLE. The pt also continues to present with sensation deficits in RLE, reports this is improved from prior to surgery but not yet equal to LLE. MMT limited by pain at this time but no functional deficits noted with gait or stair training. Pt is safe to return home with family support when medically cleared.      If plan is discharge home, recommend the following: A little help with walking and/or transfers;Help with stairs or ramp for entrance   Can travel by private vehicle        Equipment Recommendations Rolling walker (2 wheels)  Recommendations for Other Services       Functional Status Assessment Patient has had a recent decline in their functional status and demonstrates the ability to make significant improvements in function in a reasonable and predictable amount of time.     Precautions / Restrictions Precautions Precautions: Back Precaution Booklet Issued: Yes (comment) Precaution Comments: pt only able to name 1/3, reviewed verbally Required Braces or Orthoses: Spinal Brace Spinal  Brace: Thoracolumbosacral orthotic;Applied in sitting position Restrictions Weight Bearing Restrictions: No      Mobility  Bed Mobility Overal bed mobility: Modified Independent             General bed mobility comments: sitting EOB upon arrival    Transfers Overall transfer level: Needs assistance Equipment used: Rolling walker (2 wheels) Transfers: Sit to/from Stand Sit to Stand: Supervision           General transfer comment: supervision for safety, no overt LOB or buckling in RLE. dependent on UE. cues for hand placement    Ambulation/Gait Ambulation/Gait assistance: Supervision Gait Distance (Feet): 300 Feet Assistive device: Rolling walker (2 wheels) Gait Pattern/deviations: Step-through pattern, Decreased stride length Gait velocity: decreased     General Gait Details: slow but steady pace, cues for posture  Stairs Stairs: Yes Stairs assistance: Supervision Stair Management: One rail Left, Alternating pattern, Forwards Number of Stairs: 3       Balance Overall balance assessment: Mild deficits observed, not formally tested                                           Pertinent Vitals/Pain Pain Assessment Pain Assessment: Faces Pain Score: 6  Faces Pain Scale: Hurts little more Pain Location: incisional, RLE numb Pain Descriptors / Indicators: Aching, Sore Pain Intervention(s): Limited activity within patient's tolerance, Monitored during session, Repositioned    Home Living Family/patient expects to be discharged to:: Private residence Living Arrangements: Spouse/significant other Available Help at  Discharge: Family;Available PRN/intermittently Type of Home: House Home Access: Stairs to enter Entrance Stairs-Rails: Right;Left;Can reach both Entrance Stairs-Number of Steps: 4   Home Layout: One level Home Equipment: Shower seat;Hand held shower head;Grab bars - tub/shower;Adaptive equipment      Prior Function Prior  Level of Function : Independent/Modified Independent             Mobility Comments: independent, R knee buckled       Extremity/Trunk Assessment   Upper Extremity Assessment Upper Extremity Assessment: Defer to OT evaluation    Lower Extremity Assessment Lower Extremity Assessment: RLE deficits/detail RLE Deficits / Details: numbness in L4 dermatome in RLE, strength WFL RLE: Unable to fully assess due to pain RLE Sensation: decreased light touch RLE Coordination: WNL    Cervical / Trunk Assessment Cervical / Trunk Assessment: Kyphotic;Back Surgery  Communication   Communication Communication: No apparent difficulties Cueing Techniques: Verbal cues  Cognition Arousal: Alert Behavior During Therapy: WFL for tasks assessed/performed Overall Cognitive Status: Within Functional Limits for tasks assessed                                          General Comments General comments (skin integrity, edema, etc.): VSS on RA        Assessment/Plan    PT Assessment All further PT needs can be met in the next venue of care  PT Problem List Decreased strength;Decreased balance;Decreased activity tolerance;Decreased mobility       PT Treatment Interventions      PT Goals (Current goals can be found in the Care Plan section)  Acute Rehab PT Goals Patient Stated Goal: return to independence PT Goal Formulation: All assessment and education complete, DC therapy Time For Goal Achievement: 04/20/23 Potential to Achieve Goals: Good     AM-PAC PT "6 Clicks" Mobility  Outcome Measure Help needed turning from your back to your side while in a flat bed without using bedrails?: None Help needed moving from lying on your back to sitting on the side of a flat bed without using bedrails?: None Help needed moving to and from a bed to a chair (including a wheelchair)?: A Little Help needed standing up from a chair using your arms (e.g., wheelchair or bedside chair)?:  A Little Help needed to walk in hospital room?: A Little Help needed climbing 3-5 steps with a railing? : A Little 6 Click Score: 20    End of Session Equipment Utilized During Treatment: Gait belt;Back brace Activity Tolerance: Patient tolerated treatment well Patient left: in bed;with call bell/phone within reach (sitting EOB) Nurse Communication: Mobility status PT Visit Diagnosis: Unsteadiness on feet (R26.81);Muscle weakness (generalized) (M62.81)    Time: 0347-4259 PT Time Calculation (min) (ACUTE ONLY): 18 min   Charges:   PT Evaluation $PT Eval Low Complexity: 1 Low   PT General Charges $$ ACUTE PT VISIT: 1 Visit         Vickki Muff, PT, DPT   Acute Rehabilitation Department Office [phone] Secure Chat Communication Preferred  Ronnie Derby 04/13/2023, 10:32 AM

## 2023-04-16 ENCOUNTER — Encounter (HOSPITAL_COMMUNITY): Payer: Self-pay | Admitting: Orthopedic Surgery

## 2023-04-25 ENCOUNTER — Other Ambulatory Visit: Payer: Self-pay

## 2023-04-26 ENCOUNTER — Other Ambulatory Visit: Payer: Self-pay

## 2023-04-27 ENCOUNTER — Other Ambulatory Visit: Payer: Self-pay

## 2023-04-27 MED ORDER — HYDROCODONE-ACETAMINOPHEN 5-325 MG PO TABS
1.0000 | ORAL_TABLET | Freq: Four times a day (QID) | ORAL | 0 refills | Status: DC | PRN
  Filled 2023-04-27: qty 60, 15d supply, fill #0

## 2023-04-29 ENCOUNTER — Other Ambulatory Visit: Payer: Self-pay | Admitting: Cardiovascular Disease

## 2023-04-29 DIAGNOSIS — Z0000 Encounter for general adult medical examination without abnormal findings: Secondary | ICD-10-CM

## 2023-04-30 NOTE — Telephone Encounter (Signed)
Hi,  Will you please outreach patient to schedule past due 6 month follow up.  Thank you,

## 2023-05-02 ENCOUNTER — Other Ambulatory Visit: Payer: Self-pay

## 2023-05-02 MED ORDER — PREGABALIN 150 MG PO CAPS
150.0000 mg | ORAL_CAPSULE | Freq: Two times a day (BID) | ORAL | 3 refills | Status: DC
  Filled 2023-05-02 (×2): qty 60, 30d supply, fill #0
  Filled 2023-05-16: qty 60, 30d supply, fill #1

## 2023-05-02 MED ORDER — CYCLOBENZAPRINE HCL 5 MG PO TABS
5.0000 mg | ORAL_TABLET | Freq: Three times a day (TID) | ORAL | 0 refills | Status: DC | PRN
  Filled 2023-05-02 (×2): qty 60, 10d supply, fill #0

## 2023-05-02 MED ORDER — HYDROCODONE-ACETAMINOPHEN 5-325 MG PO TABS
1.0000 | ORAL_TABLET | Freq: Four times a day (QID) | ORAL | 0 refills | Status: DC | PRN
  Filled 2023-05-10: qty 60, 15d supply, fill #0

## 2023-05-02 NOTE — Discharge Summary (Signed)
Patient ID: Bryan Ibarra. MRN: [id number] DOB/AGE: [birth year] 66 y.o.  Admit date: 04/12/2023 Discharge date: 04/13/2023  Admission Diagnoses:  Principal Problem:   Radiculopathy of lumbar region   Discharge Diagnoses:  Same  Past Medical History:  Diagnosis Date   Alcohol abuse    a. up to a 12 pack of beer/day. (hx of, not currently) 04/10/23   Arthritis    CAD in native artery    a. LHC 6/18: severe 1 vessel CAD with 90% thrombotic stenosis in the proximal LAD which was felt to be the culprit for the patient's non-STEMI. He underwent successful PCI/DES with a resolute onyx drug-eluting stent (3.0 x 18 mm). There was 0% residual stenosis. LVEF estimated at 55-65% by visual estimate. LVEDP was mildly elevated   Centrilobular emphysema (HCC)    Dyspnea    ED (erectile dysfunction)    GERD (gastroesophageal reflux disease)    H/O echocardiogram    a. echo 6/18: EF 60-65%, normal wall motion, grade 1 diastolic dysfunction, RV cavity size was normal with normal wall thickness and normal RV systolic function. There were no significant valvular abnormalities   Hearing loss    HLD (hyperlipidemia)    Hypertension    Marijuana abuse    hx of. Not currently (04/10/23)   NSTEMI (non-ST elevated myocardial infarction) (HCC)    a. LHC 6/18: severe 1 vessel CAD with 90% thrombotic stenosis in the proximal LAD which was felt to be the culprit for the patient's non-STEMI. He underwent successful PCI/DES with a resolute onyx drug-eluting stent (3.0 x 18 mm). There was 0% residual stenosis. LVEF estimated at 55-65% by visual estimate. LVEDP was mildly elevated   Senile purpura (HCC)    Spinal stenosis    Tobacco abuse     Surgeries: Procedure(s): RIGHT-SIDED LUMBAR 4 - LUMBAR 5 TRANSFORAMINAL LUMBAR INTERBODY FUSION WITH INSTRUMENTATION AND ALLOGRAFT on 04/12/2023   Consultants: None  Discharged Condition: Improved  Hospital Course: Bryan Trundy. is an 67 y.o. male who was  admitted 04/12/2023 for operative treatment of Radiculopathy of lumbar region. Patient has severe unremitting pain that affects sleep, daily activities, and work/hobbies. After pre-op clearance the patient was taken to the operating room on 04/12/2023 and underwent  Procedure(s): RIGHT-SIDED LUMBAR 4 - LUMBAR 5 TRANSFORAMINAL LUMBAR INTERBODY FUSION WITH INSTRUMENTATION AND ALLOGRAFT.    Patient was given perioperative antibiotics:  Anti-infectives (From admission, onward)    Start     Dose/Rate Route Frequency Ordered Stop   04/12/23 1600  ceFAZolin (ANCEF) IVPB 2g/100 mL premix        2 g 200 mL/hr over 30 Minutes Intravenous Every 8 hours 04/12/23 1334 04/13/23 0827   04/12/23 0700  ceFAZolin (ANCEF) IVPB 3g/100 mL premix  Status:  Discontinued        3 g 200 mL/hr over 30 Minutes Intravenous On call to O.R. 04/12/23 0545 04/12/23 1349        Patient was given sequential compression devices, early ambulation to prevent DVT.  Patient benefited maximally from hospital stay and there were no complications.    Recent vital signs: BP (!) 128/93 (BP Location: Left Arm)   Pulse 78   Temp 98.8 F (37.1 C) (Oral)   Resp 18   Ht 6\' 2"  (1.88 m)   Wt 120.2 kg   SpO2 96%   BMI 34.02 kg/m    Discharge Medications:   Allergies as of 04/13/2023   No Known Allergies  Medication List     STOP taking these medications    aspirin EC 81 MG tablet   gabapentin 300 MG capsule Commonly known as: NEURONTIN   HYDROcodone-acetaminophen 5-325 MG tablet Commonly known as: NORCO/VICODIN   pregabalin 75 MG capsule Commonly known as: Lyrica       TAKE these medications    albuterol 108 (90 Base) MCG/ACT inhaler Commonly known as: VENTOLIN HFA Inhale 2 puffs into the lungs every 6 (six) hours as needed for wheezing or shortness of breath.   amLODipine 5 MG tablet Commonly known as: NORVASC TAKE 1 TABLET (5 MG TOTAL) BY MOUTH DAILY. What changed: when to take this   Breztri  Aerosphere 160-9-4.8 MCG/ACT Aero Generic drug: Budeson-Glycopyrrol-Formoterol Inhale 2 puffs into the lungs 2 (two) times daily.   carvedilol 6.25 MG tablet Commonly known as: COREG TAKE 1 TABLET(6.25 MG) BY MOUTH TWICE DAILY WITH A MEAL What changed:  how much to take how to take this when to take this   cyclobenzaprine 5 MG tablet Commonly known as: FLEXERIL Take 1-2 tablets (5-10 mg total) by mouth 3 (three) times daily as needed.   DERMEND FRAGILE SKIN EX Apply 1 Application topically daily as needed (bruising).   lisinopril 40 MG tablet Commonly known as: ZESTRIL TAKE 1 TABLET(40 MG) BY MOUTH DAILY//NEED FOLLOW UP APPOINTMENT SCHEDULED PRIOR TO 90 DAY SUPPLY What changed: See the new instructions.   Melatonin 10 MG Tabs Take 10 mg by mouth at bedtime.   methocarbamol 500 MG tablet Commonly known as: ROBAXIN Take 1-2 tablets (500-1,000 mg total) by mouth every 6 (six) hours as needed for muscle spasms.   montelukast 10 MG tablet Commonly known as: SINGULAIR TAKE 1 TABLET BY MOUTH EVERYDAY AT BEDTIME   omeprazole 40 MG capsule Commonly known as: PRILOSEC TAKE 1 CAPSULE (40 MG TOTAL) BY MOUTH DAILY.   oxyCODONE-acetaminophen 5-325 MG tablet Commonly known as: PERCOCET/ROXICET Take 1-2 tablets by mouth every 6 (six) hours as needed for severe pain.   rosuvastatin 10 MG tablet Commonly known as: CRESTOR TAKE 1 TABLET BY MOUTH EVERY DAY What changed:  how much to take how to take this when to take this additional instructions        Diagnostic Studies: DG Lumbar Spine 2-3 Views  Result Date: 04/12/2023 CLINICAL DATA:  L4-L5 fusion EXAM: LUMBAR SPINE - 2-3 VIEW COMPARISON:  Lumbar spine MRI 03/12/2023 FINDINGS: Two C-arm fluoroscopic images were obtained intraoperatively and submitted for post operative interpretation. Postsurgical changes reflecting L4-L5 posterior instrumented fusion with interbody spacer placement are noted. Hardware alignment is within  expected limits, without evidence of immediate complication. Fluoro time 43 seconds, dose 41.14 mGy. Please see the performing provider's procedural report for further detail. IMPRESSION: Intraoperative images during L4-L5 fusion. Electronically Signed   By: Lesia Hausen M.D.   On: 04/12/2023 14:06   DG Lumbar Spine 1 View  Result Date: 04/12/2023 CLINICAL DATA:  Intraoperative imaging for localization EXAM: LUMBAR SPINE - 1 VIEW COMPARISON:  Lumbar spine MRI 03/02/2023 FINDINGS: A single lateral image of the lumbar spine was obtained. The lowest formed disc space is designated L5-S1 which is in keeping with prior lumbar spine MRI. Two surgical markers are noted. The more superior marker is posterior to the L3-L4 disc space, and the more inferior marker is posterior to the L5-S1 disc space. IMPRESSION: Intraoperative imaging for localization as above. Electronically Signed   By: Lesia Hausen M.D.   On: 04/12/2023 14:05   DG C-Arm 1-60 Min-No  Report  Result Date: 04/12/2023 Fluoroscopy was utilized by the requesting physician.  No radiographic interpretation.   DG C-Arm 1-60 Min-No Report  Result Date: 04/12/2023 Fluoroscopy was utilized by the requesting physician.  No radiographic interpretation.   DG C-Arm 1-60 Min-No Report  Result Date: 04/12/2023 Fluoroscopy was utilized by the requesting physician.  No radiographic interpretation.    Disposition: Discharge disposition: 01-Home or Self Care       Discharge Instructions     Discharge patient   Complete by: As directed    Post PT/OT and drain pull   Discharge disposition: 01-Home or Self Care   Discharge patient date: 04/13/2023      POD #1 s/p Lumbar decompression and fusion, doing well   - up with PT/OT, encourage ambulation - Percocet for pain, Robaxin for muscle spasms             -Sent to trans of care pharm to deliver to room prior to D/C -Scripts for pain sent to pharmacy electronically  -D/C instructions sheet  printed and in chart -D/C today  -F/U in office 2 weeks   Signed: Eilene Ghazi Bryan Ibarra 05/02/2023, 3:01 PM

## 2023-05-10 ENCOUNTER — Other Ambulatory Visit: Payer: Self-pay

## 2023-05-10 ENCOUNTER — Other Ambulatory Visit: Payer: Self-pay | Admitting: Internal Medicine

## 2023-05-10 NOTE — Telephone Encounter (Signed)
Requested Prescriptions  Pending Prescriptions Disp Refills   albuterol (VENTOLIN HFA) 108 (90 Base) MCG/ACT inhaler [Pharmacy Med Name: ALBUTEROL HFA (PROAIR) INHALER] 8.5 each 2    Sig: TAKE 2 PUFFS BY MOUTH EVERY 6 HOURS AS NEEDED FOR WHEEZE OR SHORTNESS OF BREATH     Pulmonology:  Beta Agonists 2 Failed - 05/10/2023  1:30 AM      Failed - Last BP in normal range    BP Readings from Last 1 Encounters:  04/13/23 (!) 128/93         Passed - Last Heart Rate in normal range    Pulse Readings from Last 1 Encounters:  04/13/23 78         Passed - Valid encounter within last 12 months    Recent Outpatient Visits           5 months ago Mixed hyperlipidemia   [facility] [facility] [place], Salvadore Oxford, NP   11 months ago Welcome to [employer] preventive visit   [place] [facility] [place], Salvadore Oxford, NP   1 year ago Acute nasopharyngitis   [facility] [place], Salvadore Oxford, NP   1 year ago Flu-like symptoms    [place] [facility] [place], Salvadore Oxford, NP   1 year ago Erroneous encounter - disregard   [place] [facility] [place], Salvadore Oxford, NP       Future Appointments             In 1 week Sampson Si, Salvadore Oxford, NP [place] [facility], [facility]

## 2023-05-15 ENCOUNTER — Ambulatory Visit (INDEPENDENT_AMBULATORY_CARE_PROVIDER_SITE_OTHER): Payer: Medicare Other | Admitting: Internal Medicine

## 2023-05-15 ENCOUNTER — Encounter: Payer: Self-pay | Admitting: Internal Medicine

## 2023-05-15 ENCOUNTER — Encounter: Payer: Medicare Other | Admitting: Neurosurgery

## 2023-05-15 VITALS — BP 150/84 | HR 89 | Ht 74.0 in | Wt 271.0 lb

## 2023-05-15 DIAGNOSIS — M5441 Lumbago with sciatica, right side: Secondary | ICD-10-CM

## 2023-05-15 DIAGNOSIS — I252 Old myocardial infarction: Secondary | ICD-10-CM

## 2023-05-15 DIAGNOSIS — I2510 Atherosclerotic heart disease of native coronary artery without angina pectoris: Secondary | ICD-10-CM | POA: Diagnosis not present

## 2023-05-15 DIAGNOSIS — N522 Drug-induced erectile dysfunction: Secondary | ICD-10-CM

## 2023-05-15 DIAGNOSIS — E782 Mixed hyperlipidemia: Secondary | ICD-10-CM

## 2023-05-15 DIAGNOSIS — E66811 Obesity, class 1: Secondary | ICD-10-CM

## 2023-05-15 DIAGNOSIS — I700 Atherosclerosis of aorta: Secondary | ICD-10-CM

## 2023-05-15 DIAGNOSIS — R739 Hyperglycemia, unspecified: Secondary | ICD-10-CM | POA: Diagnosis not present

## 2023-05-15 DIAGNOSIS — K219 Gastro-esophageal reflux disease without esophagitis: Secondary | ICD-10-CM

## 2023-05-15 DIAGNOSIS — I10 Essential (primary) hypertension: Secondary | ICD-10-CM

## 2023-05-15 DIAGNOSIS — M5416 Radiculopathy, lumbar region: Secondary | ICD-10-CM

## 2023-05-15 DIAGNOSIS — J432 Centrilobular emphysema: Secondary | ICD-10-CM

## 2023-05-15 DIAGNOSIS — Z6834 Body mass index (BMI) 34.0-34.9, adult: Secondary | ICD-10-CM

## 2023-05-15 DIAGNOSIS — G8929 Other chronic pain: Secondary | ICD-10-CM

## 2023-05-15 DIAGNOSIS — E6609 Other obesity due to excess calories: Secondary | ICD-10-CM

## 2023-05-15 MED ORDER — ASPIRIN 81 MG PO TBEC: 81.0000 mg | DELAYED_RELEASE_TABLET | Freq: Every day | ORAL | Status: DC

## 2023-05-15 MED ORDER — VARDENAFIL HCL 10 MG PO TABS: 10.0000 mg | ORAL_TABLET | Freq: Every day | ORAL | 5 refills | Status: AC | PRN

## 2023-05-15 NOTE — Assessment & Plan Note (Signed)
Will discontinue sildenafil as it is not effective We will try Levitra

## 2023-05-15 NOTE — Assessment & Plan Note (Signed)
Status post surgery 1 month ago Continue back brace as prescribed Continue hydrocodone, pregabalin, cyclobenzaprine and methocarbamol

## 2023-05-15 NOTE — Progress Notes (Signed)
Subjective:    Patient ID: Bryan Ibarra., male    DOB: 1956-04-20, 67 y.o.   MRN: [id number]  HPI  Patient presents to clinic today for 52-month follow-up of chronic conditions.  HTN: His BP today is 158/90 but he reports he is in significant pain.  He also reports he did not take his blood pressure medication until right before his appointment.  He is taking amlodipine, carvedilol and lisinopril as prescribed.  ECG from 05/2023 reviewed.  HLD with CAD status post MI: His last LDL was 42, triglycerides 474, 10/2022.  He denies myalgias on rosuvastatin.  He is taking aspirin, amlodipine, carvedilol and lisinopril as prescribed.  He follows with cardiology.  COPD: He denies chronic cough but has chronic shortness of breath.  He is breztri and albuterol as prescribed.  There are no PFTs on file.  He does continue to smoke.  He does not follow with pulmonology.  GERD: Triggered by spicy foods.  He denies breakthrough on omeprazole.  There is no upper GI on file.  ED: Managed with sildenafil as needed although he does not feel like this medication is effective and would like to try an alternative.  He does not follow with urology.  Chronic back pain: Deteriorated since surgery 04/2023. He is wearing a back brace. Managed with hydrocodone, pregabalin, cyclobenzaprine, and methocarbamol as prescribed by pain management.  MRI lumbar spine from 03/2023 reviewed.  Review of Systems     Past Medical History:  Diagnosis Date   Alcohol abuse    a. up to a 12 pack of beer/day. (hx of, not currently) 04/10/23   Arthritis    CAD in native artery    a. LHC 6/18: severe 1 vessel CAD with 90% thrombotic stenosis in the proximal LAD which was felt to be the culprit for the patient's non-STEMI. He underwent successful PCI/DES with a resolute onyx drug-eluting stent (3.0 x 18 mm). There was 0% residual stenosis. LVEF estimated at 55-65% by visual estimate. LVEDP was mildly elevated   Centrilobular  emphysema (HCC)    Dyspnea    ED (erectile dysfunction)    GERD (gastroesophageal reflux disease)    H/O echocardiogram    a. echo 6/18: EF 60-65%, normal wall motion, grade 1 diastolic dysfunction, RV cavity size was normal with normal wall thickness and normal RV systolic function. There were no significant valvular abnormalities   Hearing loss    HLD (hyperlipidemia)    Hypertension    Marijuana abuse    hx of. Not currently (04/10/23)   NSTEMI (non-ST elevated myocardial infarction) (HCC)    a. LHC 6/18: severe 1 vessel CAD with 90% thrombotic stenosis in the proximal LAD which was felt to be the culprit for the patient's non-STEMI. He underwent successful PCI/DES with a resolute onyx drug-eluting stent (3.0 x 18 mm). There was 0% residual stenosis. LVEF estimated at 55-65% by visual estimate. LVEDP was mildly elevated   Senile purpura (HCC)    Spinal stenosis    Tobacco abuse     Current Outpatient Medications  Medication Sig Dispense Refill   albuterol (VENTOLIN HFA) 108 (90 Base) MCG/ACT inhaler TAKE 2 PUFFS BY MOUTH EVERY 6 HOURS AS NEEDED FOR WHEEZE OR SHORTNESS OF BREATH 8.5 each 2   amLODipine (NORVASC) 5 MG tablet TAKE 1 TABLET (5 MG TOTAL) BY MOUTH DAILY. (Patient taking differently: Take 5 mg by mouth at bedtime.) 90 tablet 1   Budeson-Glycopyrrol-Formoterol (BREZTRI AEROSPHERE) 160-9-4.8 MCG/ACT AERO Inhale 2 puffs  into the lungs 2 (two) times daily. 10.7 g 3   carvedilol (COREG) 6.25 MG tablet TAKE 1 TABLET(6.25 MG) BY MOUTH TWICE DAILY WITH A MEAL (Patient taking differently: Take 6.25 mg by mouth at bedtime. TAKE 1 TABLET(6.25 MG) BY MOUTH TWICE DAILY WITH A MEAL) 180 tablet 1   cyclobenzaprine (FLEXERIL) 5 MG tablet Take 1-2 tablets (5-10 mg total) by mouth 3 (three) times daily as needed. 60 tablet 0   cyclobenzaprine (FLEXERIL) 5 MG tablet Take 1-2 tablets (5-10 mg total) by mouth 3 (three) times daily as needed. 60 tablet 0   Emollient (DERMEND FRAGILE SKIN EX) Apply 1  Application topically daily as needed (bruising).     HYDROcodone-acetaminophen (NORCO/VICODIN) 5-325 MG tablet Take 1 tablet by mouth every six to eight hours as needed for pain 60 tablet 0   HYDROcodone-acetaminophen (NORCO/VICODIN) 5-325 MG tablet Take 1 tablet by mouth every 6 (six) to 8 (eight) hours as needed for pain 60 tablet 0   lisinopril (ZESTRIL) 40 MG tablet TAKE 1 TABLET(40 MG) BY MOUTH DAILY//NEED FOLLOW UP APPOINTMENT SCHEDULED PRIOR TO 90 DAY SUPPLY (Patient taking differently: Take 40 mg by mouth at bedtime.) 30 tablet 0   Melatonin 10 MG TABS Take 10 mg by mouth at bedtime.     methocarbamol (ROBAXIN) 500 MG tablet Take 1-2 tablets (500-1,000 mg total) by mouth every 6 (six) hours as needed for muscle spasms. 60 tablet 0   montelukast (SINGULAIR) 10 MG tablet TAKE 1 TABLET BY MOUTH EVERYDAY AT BEDTIME 90 tablet 0   omeprazole (PRILOSEC) 40 MG capsule TAKE 1 CAPSULE (40 MG TOTAL) BY MOUTH DAILY. 90 capsule 0   oxyCODONE-acetaminophen (PERCOCET/ROXICET) 5-325 MG tablet Take 1-2 tablets by mouth every 6 (six) hours as needed for severe pain. 60 tablet 0   pregabalin (LYRICA) 150 MG capsule Take 1 capsule (150 mg total) by mouth 2 (two) times daily for leg/nerve pain 60 capsule 3   rosuvastatin (CRESTOR) 10 MG tablet TAKE 1 TABLET BY MOUTH EVERY DAY (Patient taking differently: Take 10 mg by mouth at bedtime.) 90 tablet 1   No current facility-administered medications for this visit.    No Known Allergies  Family History  Problem Relation Age of Onset   Osteoporosis Mother    CAD Father        s/p CABG in his 65's or 18's.   Diabetes Father    Colon polyps Brother    CAD Paternal Grandfather     Social History   Socioeconomic History   Marital status: Divorced    Spouse name: Not on file   Number of children: 1   Years of education: Not on file   Highest education level: 9th grade  Occupational History   Not on file  Tobacco Use   Smoking status: Every Day     Current packs/day: 1.00    Average packs/day: 1 pack/day for 42.0 years (42.0 ttl pk-yrs)    Types: Cigarettes   Smokeless tobacco: Never   Tobacco comments:    About 8 cigarettes per day (04/10/23)  Vaping Use   Vaping status: Never Used  Substance and Sexual Activity   Alcohol use: Not Currently   Drug use: Not Currently   Sexual activity: Yes  Other Topics Concern   Not on file  Social History Narrative   Lives in [place].  He lives in a mobile home in the backyard of his girlfriend.  He works in Aeronautical engineer.   Social Determinants of Health  Financial Resource Strain: Medium Risk (05/12/2023)   Overall Financial Resource Strain (CARDIA)    Difficulty of Paying Living Expenses: Somewhat hard  Food Insecurity: No Food Insecurity (05/12/2023)   Hunger Vital Sign    Worried About Running Out of Food in the Last Year: Never true    Ran Out of Food in the Last Year: Never true  Transportation Needs: No Transportation Needs (05/12/2023)   PRAPARE - Administrator, Civil Service (Medical): No    Lack of Transportation (Non-Medical): No  Physical Activity: Insufficiently Active (05/12/2023)   Exercise Vital Sign    Days of Exercise per Week: 2 days    Minutes of Exercise per Session: 20 min  Stress: Stress Concern Present (05/12/2023)   [employer] of Occupational Health - Occupational Stress Questionnaire    Feeling of Stress : To some extent  Social Connections: Moderately Integrated (05/12/2023)   Social Connection and Isolation Panel [NHANES]    Frequency of Communication with Friends and Family: More than three times a week    Frequency of Social Gatherings with Friends and Family: Twice a week    Attends Religious Services: More than 4 times per year    Active Member of [employer] or Organizations: No    Attends Engineer, structural: Not on file    Marital Status: Living with partner  Intimate Partner Violence: Not on file     Constitutional:  Patient reports unintentional weight gain.  Denies fever, malaise, fatigue, headache.  HEENT: Denies eye pain, eye redness, ear pain, ringing in the ears, wax buildup, runny nose, nasal congestion, bloody nose, or sore throat. Respiratory: Patient reports shortness of breath.  Denies difficulty breathing, cough or sputum production.   Cardiovascular: Patient reports swelling in legs.  Denies chest pain, chest tightness, palpitations or swelling in the hands.  Gastrointestinal: Denies abdominal pain, bloating, constipation, diarrhea or blood in the stool.  GU: Denies urgency, frequency, pain with urination, burning sensation, blood in urine, odor or discharge. Musculoskeletal: Patient reports chronic back pain.  Denies decrease in range of motion, difficulty with gait, or joint swelling.  Skin: Denies redness, rashes, lesions or ulcercations.  Neurological: Pt reports numbness in legs. Denies dizziness, difficulty with memory, difficulty with speech or problems with balance and coordination.  Psych: Denies anxiety, depression, SI/HI.  No other specific complaints in a complete review of systems (except as listed in HPI above).  Objective:   Physical Exam  BP (!) 158/90   Pulse 89   Ht 6\' 2"  (1.88 m)   Wt 271 lb (122.9 kg)   SpO2 97%   BMI 34.79 kg/m   Wt Readings from Last 3 Encounters:  04/12/23 265 lb (120.2 kg)  04/10/23 265 lb (120.2 kg)  02/09/23 266 lb 15.6 oz (121.1 kg)    General: Appears his stated age, obese, in NAD. Skin: Warm, dry and intact.  HEENT: Head: normal shape and size; Eyes: sclera white, no icterus, conjunctiva pink, PERRLA and EOMs intact;  Cardiovascular: Normal rate and rhythm. S1,S2 noted.  No murmur, rubs or gallops noted. No JVD 1+ nonpitting BLE edema. No carotid bruits noted. Pulmonary/Chest: Normal effort and positive vesicular breath sounds. No respiratory distress. No wheezes, rales or ronchi noted.  Abdomen: Soft and nontender. Normal bowel sounds.   Musculoskeletal: Wearing back brace.  Gait slow and steady without device.  Neurological: Alert and oriented. Cranial nerves II-XII grossly intact. Coordination normal.  Psychiatric: Mood and affect normal. Behavior is normal. Judgment  and thought content normal.    BMET    Component Value Date/Time   NA 140 04/10/2023 0929   NA 140 10/04/2021 1459   K 3.8 04/10/2023 0929   CL 106 04/10/2023 0929   CO2 27 04/10/2023 0929   GLUCOSE 92 04/10/2023 0929   BUN 7 (L) 04/10/2023 0929   BUN 14 10/04/2021 1459   CREATININE 0.73 04/10/2023 0929   CREATININE 1.08 11/28/2022 1345   CALCIUM 9.2 04/10/2023 0929   GFRNONAA >60 04/10/2023 0929   GFRNONAA 69 01/30/2020 1115   GFRAA 80 01/30/2020 1115    Lipid Panel     Component Value Date/Time   CHOL 135 11/28/2022 1345   TRIG 181 (H) 11/28/2022 1345   HDL 67 11/28/2022 1345   CHOLHDL 2.0 11/28/2022 1345   VLDL 12 06/24/2018 1725   LDLCALC 42 11/28/2022 1345    CBC    Component Value Date/Time   WBC 7.9 04/10/2023 0929   RBC 5.13 04/10/2023 0929   HGB 15.7 04/10/2023 0929   HGB 16.7 10/04/2021 1459   HCT 47.4 04/10/2023 0929   HCT 49.2 10/04/2021 1459   PLT 228 04/10/2023 0929   PLT 272 10/04/2021 1459   MCV 92.4 04/10/2023 0929   MCV 92 10/04/2021 1459   MCH 30.6 04/10/2023 0929   MCHC 33.1 04/10/2023 0929   RDW 12.8 04/10/2023 0929   RDW 13.1 10/04/2021 1459   LYMPHSABS 2.0 10/04/2021 1459   MONOABS 0.5 08/02/2019 0703   EOSABS 0.1 10/04/2021 1459   BASOSABS 0.1 10/04/2021 1459    Hgb A1C Lab Results  Component Value Date   HGBA1C 5.5 11/28/2022           Assessment & Plan:      RTC in 6 months for follow-up of chronic conditions Nicki Reaper, NP

## 2023-05-15 NOTE — Patient Instructions (Signed)

## 2023-05-15 NOTE — Assessment & Plan Note (Signed)
Consider [phone] once he has healed from his back surgery Encourage low-carb diet and exercise for weight loss

## 2023-05-15 NOTE — Assessment & Plan Note (Signed)
C-Met and lipid profile today Encouraged him to consume a low-fat diet Continue rosuvastatin

## 2023-05-15 NOTE — Assessment & Plan Note (Signed)
Encourage smoking cessation Continue Breztri and albuterol

## 2023-05-15 NOTE — Assessment & Plan Note (Signed)
C-Met and lipid profile today Encouraged him to consume a low-fat diet Continue rosuvastatin, aspirin and carvedilol

## 2023-05-15 NOTE — Assessment & Plan Note (Signed)
Avoid foods that trigger reflux Encourage weight loss as this can help reduce reflux symptoms Continue omeprazole

## 2023-05-15 NOTE — Assessment & Plan Note (Signed)
C-Met and lipid profile today Encouraged him to consume a low-fat diet Continue rosuvastatin and aspirin

## 2023-05-15 NOTE — Assessment & Plan Note (Signed)
No angina C-Met and lipid profile today Encouraged him to consume a low-fat diet Continue rosuvastatin, carvedilol and aspirin

## 2023-05-15 NOTE — Assessment & Plan Note (Signed)
Elevated today but is in pain and did not take his blood pressure medication long before his appointment Continue amlodipine, carvedilol and lisinopril Given edema, discussed adding diuretic however he would like to hold off at this time Reinforced DASH diet and exercise for weight loss C-Met today

## 2023-05-16 ENCOUNTER — Other Ambulatory Visit: Payer: Self-pay

## 2023-05-16 ENCOUNTER — Other Ambulatory Visit: Payer: Self-pay | Admitting: Internal Medicine

## 2023-05-16 ENCOUNTER — Other Ambulatory Visit (HOSPITAL_COMMUNITY): Payer: Self-pay

## 2023-05-16 LAB — COMPLETE METABOLIC PANEL WITH GFR
AG Ratio: 1.5 (calc) (ref 1.0–2.5)
ALT: 17 U/L (ref 9–46)
AST: 13 U/L (ref 10–35)
Albumin: 4 g/dL (ref 3.6–5.1)
Alkaline phosphatase (APISO): 124 U/L (ref 35–144)
BUN/Creatinine Ratio: 14 (calc) (ref 6–22)
BUN: 10 mg/dL (ref 7–25)
CO2: 27 mmol/L (ref 20–32)
Calcium: 9.4 mg/dL (ref 8.6–10.3)
Chloride: 104 mmol/L (ref 98–110)
Creat: 0.69 mg/dL — ABNORMAL LOW (ref 0.70–1.35)
Globulin: 2.6 g/dL (ref 1.9–3.7)
Glucose, Bld: 108 mg/dL (ref 65–139)
Potassium: 4.1 mmol/L (ref 3.5–5.3)
Sodium: 140 mmol/L (ref 135–146)
Total Bilirubin: 0.4 mg/dL (ref 0.2–1.2)
Total Protein: 6.6 g/dL (ref 6.1–8.1)
eGFR: 102 mL/min/{1.73_m2} (ref >=60)

## 2023-05-16 LAB — CBC
HCT: 45 % (ref 38.5–50.0)
Hemoglobin: 14.7 g/dL (ref 13.2–17.1)
MCH: 29.7 pg (ref 27.0–33.0)
MCHC: 32.7 g/dL (ref 32.0–36.0)
MCV: 90.9 fL (ref 80.0–100.0)
MPV: 9.2 fL (ref 7.5–12.5)
Platelets: 240 10*3/uL (ref 140–400)
RBC: 4.95 10*6/uL (ref 4.20–5.80)
RDW: 12.6 % (ref 11.0–15.0)
WBC: 7.9 10*3/uL (ref 3.8–10.8)

## 2023-05-16 LAB — ABN TEST REFUSAL

## 2023-05-16 MED ORDER — CYCLOBENZAPRINE HCL 5 MG PO TABS
5.0000 mg | ORAL_TABLET | Freq: Three times a day (TID) | ORAL | 0 refills | Status: DC | PRN
  Filled 2023-05-16: qty 60, 10d supply, fill #0

## 2023-05-16 MED ORDER — OMEPRAZOLE 40 MG PO CPDR
40.0000 mg | DELAYED_RELEASE_CAPSULE | Freq: Every day | ORAL | 0 refills | Status: DC
  Filled 2023-05-16 – 2023-06-11 (×5): qty 90, 90d supply, fill #0

## 2023-05-17 ENCOUNTER — Other Ambulatory Visit (HOSPITAL_COMMUNITY): Payer: Self-pay

## 2023-05-19 ENCOUNTER — Other Ambulatory Visit: Payer: Self-pay | Admitting: Internal Medicine

## 2023-05-19 DIAGNOSIS — Z0000 Encounter for general adult medical examination without abnormal findings: Secondary | ICD-10-CM

## 2023-05-21 ENCOUNTER — Ambulatory Visit: Payer: Medicare Other | Admitting: Internal Medicine

## 2023-05-21 NOTE — Telephone Encounter (Signed)
Requested Prescriptions  Pending Prescriptions Disp Refills   amLODipine (NORVASC) 5 MG tablet [Pharmacy Med Name: AMLODIPINE BESYLATE 5 MG TAB] 90 tablet 2    Sig: TAKE 1 TABLET (5 MG TOTAL) BY MOUTH DAILY.     Cardiovascular: Calcium Channel Blockers 2 Failed - 05/19/2023  6:02 AM      Failed - Last BP in normal range    BP Readings from Last 1 Encounters:  05/15/23 (!) 150/84         Passed - Last Heart Rate in normal range    Pulse Readings from Last 1 Encounters:  05/15/23 89         Passed - Valid encounter within last 6 months    Recent Outpatient Visits           6 days ago Mixed hyperlipidemia   [place] [facility] [place], Salvadore Oxford, NP   5 months ago Mixed hyperlipidemia   [place] [facility] [place], Salvadore Oxford, NP   1 year ago Welcome to [employer] preventive visit   [place] [facility] [place], Salvadore Oxford, NP   1 year ago Acute nasopharyngitis   [place] [facility] [place], Salvadore Oxford, NP   1 year ago Flu-like symptoms   [place] [facility] [place], Salvadore Oxford, NP       Future Appointments             In 5 months Baity, Salvadore Oxford, NP [place] [facility], [facility]

## 2023-05-24 ENCOUNTER — Other Ambulatory Visit: Payer: Self-pay

## 2023-05-25 ENCOUNTER — Other Ambulatory Visit: Payer: Self-pay

## 2023-05-25 ENCOUNTER — Encounter: Payer: Self-pay | Admitting: Internal Medicine

## 2023-05-25 MED ORDER — PREGABALIN 150 MG PO CAPS
150.0000 mg | ORAL_CAPSULE | Freq: Two times a day (BID) | ORAL | 3 refills | Status: DC
  Filled 2023-05-28: qty 60, 30d supply, fill #0
  Filled 2023-06-19 – 2023-07-02 (×2): qty 60, 30d supply, fill #1
  Filled 2023-08-02: qty 60, 30d supply, fill #2
  Filled 2023-08-27 – 2023-08-31 (×2): qty 60, 30d supply, fill #3

## 2023-05-25 MED ORDER — HYDROCODONE-ACETAMINOPHEN 5-325 MG PO TABS
1.0000 | ORAL_TABLET | Freq: Four times a day (QID) | ORAL | 0 refills | Status: DC | PRN
  Filled 2023-05-25: qty 60, 15d supply, fill #0

## 2023-05-25 MED ORDER — SEMAGLUTIDE-WEIGHT MANAGEMENT 0.25 MG/0.5ML ~~LOC~~ SOAJ
0.2500 mg | SUBCUTANEOUS | 0 refills | Status: DC
  Filled 2023-05-25 – 2023-05-29 (×4): qty 2, 28d supply, fill #0

## 2023-05-28 ENCOUNTER — Other Ambulatory Visit: Payer: Self-pay

## 2023-05-28 NOTE — Telephone Encounter (Signed)
Per pharmacy semaglutide is not on the patients formulary.  No alternatives suggested at this time.

## 2023-05-28 NOTE — Telephone Encounter (Signed)
Have we seen a prior Auth come through on this?

## 2023-05-29 ENCOUNTER — Other Ambulatory Visit: Payer: Self-pay

## 2023-05-29 NOTE — Telephone Encounter (Signed)
Additional records faxed to [facility] for medication approval.

## 2023-05-29 NOTE — Telephone Encounter (Signed)
[employer] has approved wegovy.

## 2023-06-01 ENCOUNTER — Ambulatory Visit: Payer: Medicare Other | Admitting: Internal Medicine

## 2023-06-06 ENCOUNTER — Other Ambulatory Visit: Payer: Self-pay

## 2023-06-06 ENCOUNTER — Other Ambulatory Visit (HOSPITAL_COMMUNITY): Payer: Self-pay

## 2023-06-07 ENCOUNTER — Other Ambulatory Visit: Payer: Self-pay

## 2023-06-08 ENCOUNTER — Other Ambulatory Visit: Payer: Self-pay

## 2023-06-08 MED ORDER — HYDROCODONE-ACETAMINOPHEN 5-325 MG PO TABS
1.0000 | ORAL_TABLET | Freq: Four times a day (QID) | ORAL | 0 refills | Status: DC | PRN
  Filled 2023-06-08: qty 60, 15d supply, fill #0

## 2023-06-10 ENCOUNTER — Other Ambulatory Visit: Payer: Self-pay | Admitting: Internal Medicine

## 2023-06-10 DIAGNOSIS — Z0000 Encounter for general adult medical examination without abnormal findings: Secondary | ICD-10-CM

## 2023-06-11 ENCOUNTER — Other Ambulatory Visit (HOSPITAL_COMMUNITY): Payer: Self-pay

## 2023-06-11 NOTE — Telephone Encounter (Signed)
Requested Prescriptions  Pending Prescriptions Disp Refills   rosuvastatin (CRESTOR) 10 MG tablet [Pharmacy Med Name: ROSUVASTATIN CALCIUM 10 MG TAB] 90 tablet 1    Sig: TAKE 1 TABLET BY MOUTH EVERY DAY     Cardiovascular:  Antilipid - Statins 2 Failed - 06/10/2023  8:52 AM      Failed - Cr in normal range and within 360 days    Creat  Date Value Ref Range Status  05/15/2023 0.69 (L) 0.70 - 1.35 mg/dL Final         Failed - Lipid Panel in normal range within the last 12 months    Cholesterol  Date Value Ref Range Status  11/28/2022 135 <200 mg/dL Final   LDL Cholesterol (Calc)  Date Value Ref Range Status  11/28/2022 42 mg/dL (calc) Final    Comment:    Reference range: <100 . Desirable range <100 mg/dL for primary prevention;   <70 mg/dL for patients with CHD or diabetic patients  with > or = 2 CHD risk factors. Marland Kitchen LDL-C is now calculated using the Martin-Hopkins  calculation, which is a validated novel method providing  better accuracy than the Friedewald equation in the  estimation of LDL-C.  Horald Pollen et al. Lenox Ahr. 1610;960(45): 2061-2068  (http://education.[facility].com/faq/FAQ164)    Direct LDL  Date Value Ref Range Status  06/24/2018 <4 0 - 99 mg/dL Final    Comment:    (NOTE) Performed At: [facility] [address] [place], [place] [id number] Jolene Schimke MD NW:[id number]    HDL  Date Value Ref Range Status  11/28/2022 67 > OR = 40 mg/dL Final   Triglycerides  Date Value Ref Range Status  11/28/2022 181 (H) <150 mg/dL Final         Passed - Patient is not pregnant      Passed - Valid encounter within last 12 months    Recent Outpatient Visits           3 weeks ago Mixed hyperlipidemia   [place] [facility] [place], Salvadore Oxford, NP   6 months ago Mixed hyperlipidemia   [place] [facility] [place], Salvadore Oxford, NP   1 year ago Welcome to [employer] preventive visit   [place] [facility] [place], Salvadore Oxford, NP   1 year ago Acute nasopharyngitis   [place] [facility] [place], Salvadore Oxford, NP   2 years ago Flu-like symptoms   [place] [facility] [place], Salvadore Oxford, NP       Future Appointments             In 3 days [place] Corin, Chelsea Aus, MD  [facility]   In 4 months Baity, Salvadore Oxford, NP  [facility], [facility]

## 2023-06-14 ENCOUNTER — Encounter: Payer: Self-pay | Admitting: Cardiovascular Disease

## 2023-06-14 ENCOUNTER — Ambulatory Visit: Payer: Medicare Other | Attending: Cardiovascular Disease | Admitting: Cardiovascular Disease

## 2023-06-14 VITALS — BP 138/90 | HR 77 | Ht 74.0 in | Wt 265.2 lb

## 2023-06-14 DIAGNOSIS — Z720 Tobacco use: Secondary | ICD-10-CM | POA: Insufficient documentation

## 2023-06-14 DIAGNOSIS — E785 Hyperlipidemia, unspecified: Secondary | ICD-10-CM | POA: Diagnosis not present

## 2023-06-14 DIAGNOSIS — I10 Essential (primary) hypertension: Secondary | ICD-10-CM | POA: Insufficient documentation

## 2023-06-14 DIAGNOSIS — I2510 Atherosclerotic heart disease of native coronary artery without angina pectoris: Secondary | ICD-10-CM | POA: Insufficient documentation

## 2023-06-14 MED ORDER — LISINOPRIL 40 MG PO TABS: 40.0000 mg | ORAL_TABLET | Freq: Every day | ORAL | 3 refills | Status: DC

## 2023-06-14 NOTE — Patient Instructions (Signed)
Medication Instructions:  STOP the Amlodipine  RESUME the Lisinopril 40 mg *If you need a refill on your cardiac medications before your next appointment, please call your pharmacy*   Lab Work: None ordered If you have labs (blood work) drawn today and your tests are completely normal, you will receive your results only by: MyChart Message (if you have MyChart) OR A paper copy in the mail If you have any lab test that is abnormal or we need to change your treatment, we will call you to review the results.   Testing/Procedures: None ordered   Follow-Up: At [facility], you and your health needs are our priority.  As part of our continuing mission to provide you with exceptional heart care, we have created designated Provider Care Teams.  These Care Teams include your primary Cardiologist (physician) and Advanced Practice Providers (APPs -  Physician Assistants and Nurse Practitioners) who all work together to provide you with the care you need, when you need it.  We recommend signing up for the patient portal called "MyChart".  Sign up information is provided on this After Visit Summary.  MyChart is used to connect with patients for Virtual Visits (Telemedicine).  Patients are able to view lab/test results, encounter notes, upcoming appointments, etc.  Non-urgent messages can be sent to your provider as well.   To learn more about what you can do with MyChart, go to [email or web address].    Your next appointment:   6 month(s)  Provider:   You may see Lorine Bears, MD or one of the following Advanced Practice Providers on your designated Care Team:   Nicolasa Ducking, NP Eula Listen, PA-C Cadence Fransico Michael, PA-C Charlsie Quest, NP Carlos Levering, NP

## 2023-06-14 NOTE — Progress Notes (Signed)
Cardiology Office Note   Date:  06/14/2023   ID:  [name]., DOB 04-May-1956, MRN [id number]  PCP:  Lorre Munroe, NP  Cardiologist:   Lorine Bears, MD   Chief Complaint  Patient presents with   Follow-up    6 month f/u no complaints today. Meds reviewed verbally with pt.      History of Present Illness: Bryan Ibarra. is a 67 y.o. male who presents for follow-up visit regarding coronary artery disease. He had non-ST elevation myocardial infarction in June 2018.  He underwent PCI and drug-eluting stent placement to the LAD without complications.  Ejection fraction was normal.  Other medical problems include hypertension, polysubstance abuse including tobacco, alcohol and marijuana and remote history of cocaine use. He did not tolerate high-dose atorvastatin due to myalgia.   He had an echocardiogram done in September, 2022 which showed normal LV systolic function, normal diastolic function and no significant valvular abnormalities.  He underwent lumbar spine surgery this year and has been limited by severe back pain.  He is on hydrocodone.  He stopped drinking alcohol altogether.  He cut down on tobacco use to 5 to 6 cigarettes a day.  He ran out of lisinopril more than a month ago.  He continues to have lower extremity edema.  No chest pain or shortness of breath.    Past Medical History:  Diagnosis Date   Alcohol abuse    a. up to a 12 pack of beer/day. (hx of, not currently) 04/10/23   Arthritis    CAD in native artery    a. LHC 6/18: severe 1 vessel CAD with 90% thrombotic stenosis in the proximal LAD which was felt to be the culprit for the patient's non-STEMI. He underwent successful PCI/DES with a resolute onyx drug-eluting stent (3.0 x 18 mm). There was 0% residual stenosis. LVEF estimated at 55-65% by visual estimate. LVEDP was mildly elevated   Centrilobular emphysema (HCC)    Dyspnea    ED (erectile dysfunction)    GERD (gastroesophageal reflux  disease)    H/O echocardiogram    a. echo 6/18: EF 60-65%, normal wall motion, grade 1 diastolic dysfunction, RV cavity size was normal with normal wall thickness and normal RV systolic function. There were no significant valvular abnormalities   Hearing loss    HLD (hyperlipidemia)    Hypertension    Marijuana abuse    hx of. Not currently (04/10/23)   NSTEMI (non-ST elevated myocardial infarction) (HCC)    a. LHC 6/18: severe 1 vessel CAD with 90% thrombotic stenosis in the proximal LAD which was felt to be the culprit for the patient's non-STEMI. He underwent successful PCI/DES with a resolute onyx drug-eluting stent (3.0 x 18 mm). There was 0% residual stenosis. LVEF estimated at 55-65% by visual estimate. LVEDP was mildly elevated   Senile purpura (HCC)    Spinal stenosis    Tobacco abuse     Past Surgical History:  Procedure Laterality Date   APPENDECTOMY     COLONOSCOPY     CORONARY STENT INTERVENTION N/A 01/01/2017   Procedure: Coronary Stent Intervention;  Surgeon: Iran Ouch, MD;  Location: [facility];  Service: Cardiovascular;  Laterality: N/A;   KNEE SURGERY Left    x2 (repairs)   LEFT HEART CATH AND CORONARY ANGIOGRAPHY N/A 01/01/2017   Procedure: Left Heart Cath and Coronary Angiography;  Surgeon: Iran Ouch, MD;  Location: [facility];  Service: Cardiovascular;  Laterality: N/A;   NASAL ENDOSCOPY N/A 08/02/2019   Procedure: NASAL ENDOSCOPY;  Surgeon: Tamela Gammon, MD;  Location: [facility] OR[facility];  Service: ENT;  Laterality: N/A;   NASAL HEMORRHAGE CONTROL N/A 08/02/2019   Procedure: EPISTAXIS CONTROL;  Surgeon: Tamela Gammon, MD;  Location: [facility] OR[facility];  Service: ENT;  Laterality: N/A;   TRANSFORAMINAL LUMBAR INTERBODY FUSION (TLIF) WITH PEDICLE SCREW FIXATION 1 LEVEL Right 04/12/2023   Procedure: RIGHT-SIDED LUMBAR 4 - LUMBAR 5 TRANSFORAMINAL LUMBAR INTERBODY FUSION WITH INSTRUMENTATION AND ALLOGRAFT;  Surgeon: Estill Bamberg, MD;  Location: [facility];  Service: Orthopedics;  Laterality: Right;     Current Outpatient Medications  Medication Sig Dispense Refill   albuterol (VENTOLIN HFA) 108 (90 Base) MCG/ACT inhaler TAKE 2 PUFFS BY MOUTH EVERY 6 HOURS AS NEEDED FOR WHEEZE OR SHORTNESS OF BREATH 8.5 each 2   amLODipine (NORVASC) 5 MG tablet TAKE 1 TABLET (5 MG TOTAL) BY MOUTH DAILY. 90 tablet 2   aspirin EC 81 MG tablet Take 1 tablet (81 mg total) by mouth daily. Swallow whole.     Budeson-Glycopyrrol-Formoterol (BREZTRI AEROSPHERE) 160-9-4.8 MCG/ACT AERO Inhale 2 puffs into the lungs 2 (two) times daily. 10.7 g 3   carvedilol (COREG) 6.25 MG tablet TAKE 1 TABLET(6.25 MG) BY MOUTH TWICE DAILY WITH A MEAL (Patient taking differently: 6.25 mg 2 (two) times daily with a meal.) 180 tablet 1   cyclobenzaprine (FLEXERIL) 10 MG tablet Take 5-10 mg by mouth as needed.     cyclobenzaprine (FLEXERIL) 5 MG tablet Take 1-2 tablets (5-10 mg total) by mouth 3 (three) times daily as needed. 60 tablet 0   Emollient (DERMEND FRAGILE SKIN EX) Apply 1 Application topically daily as needed (bruising).     HYDROcodone-acetaminophen (NORCO/VICODIN) 5-325 MG tablet Take 1 tablet by mouth every 6 (six) to 8 (eight) hours as needed. 60 tablet 0   Melatonin 10 MG TABS Take 10 mg by mouth at bedtime.     montelukast (SINGULAIR) 10 MG tablet TAKE 1 TABLET BY MOUTH EVERYDAY AT BEDTIME 90 tablet 0   omeprazole (PRILOSEC) 40 MG capsule Take 1 capsule (40 mg total) by mouth daily. 90 capsule 0   pregabalin (LYRICA) 150 MG capsule Take 1 capsule (150 mg total) by mouth 2 (two) times daily for leg/nerve pain 60 capsule 3   rosuvastatin (CRESTOR) 10 MG tablet TAKE 1 TABLET BY MOUTH EVERY DAY 90 tablet 3   Semaglutide-Weight Management 0.25 MG/0.5ML SOAJ Inject 0.25 mg into the skin once a week. 2 mL 0   vardenafil (LEVITRA) 10 MG tablet Take 1 tablet (10 mg total) by mouth daily as needed for erectile dysfunction. 10 tablet 5   HYDROcodone-acetaminophen (NORCO/VICODIN)  5-325 MG tablet Take 1 tablet by mouth every 6 (six) to 8 (eight) hours as needed for pain (Patient not taking: Reported on 06/14/2023) 60 tablet 0   lisinopril (ZESTRIL) 40 MG tablet TAKE 1 TABLET(40 MG) BY MOUTH DAILY//NEED FOLLOW UP APPOINTMENT SCHEDULED PRIOR TO 90 DAY SUPPLY (Patient not taking: Reported on 06/14/2023) 30 tablet 0   methocarbamol (ROBAXIN) 500 MG tablet Take 1-2 tablets (500-1,000 mg total) by mouth every 6 (six) hours as needed for muscle spasms. (Patient not taking: Reported on 06/14/2023) 60 tablet 0   pregabalin (LYRICA) 150 MG capsule Take 1 capsule (150 mg total) by mouth 2 (two) times daily for leg/nerve pain (Patient not taking: Reported on 06/14/2023) 60 capsule 3   No current facility-administered medications for this visit.    Allergies:  Patient has no known allergies.    Social History:  The patient  reports that he has been smoking cigarettes. He has a 42 pack-year smoking history. He has never used smokeless tobacco. He reports that he does not currently use alcohol. He reports that he does not currently use drugs.   Family History:  The patient's family history includes CAD in his father and paternal grandfather; Colon polyps in his brother; Diabetes in his father; Osteoporosis in his mother.    ROS:  Please see the history of present illness.   Otherwise, review of systems are positive for none.   All other systems are reviewed and negative.    PHYSICAL EXAM: VS:  BP (!) 138/90 (BP Location: Left Arm, Patient Position: Sitting, Cuff Size: Large)   Pulse 77   Ht 6\' 2"  (1.88 m)   Wt 265 lb 4 oz (120.3 kg)   SpO2 98%   BMI 34.06 kg/m  , BMI Body mass index is 34.06 kg/m. GEN: Well nourished, well developed, in no acute distress  HEENT: normal  Neck: no JVD, carotid bruits, or masses Cardiac: RRR; no murmurs, rubs, or gallops, mild bilateral leg edema Respiratory:  clear to auscultation bilaterally, normal work of breathing GI: soft, nontender,  nondistended, + BS MS: no deformity or atrophy  Skin: warm and dry, no rash Neuro:  Strength and sensation are intact Psych: euthymic mood, full affect   EKG:  EKG is ordered today. EKG showed: Normal sinus rhythm Left axis deviation When compared with ECG of 09-Feb-2023 10:00, Criteria for Septal infarct are no longer Present      Recent Labs: 05/15/2023: ALT 17; BUN 10; Creat 0.69; Hemoglobin 14.7; Platelets 240; Potassium 4.1; Sodium 140    Lipid Panel    Component Value Date/Time   CHOL 135 11/28/2022 1345   TRIG 181 (H) 11/28/2022 1345   HDL 67 11/28/2022 1345   CHOLHDL 2.0 11/28/2022 1345   VLDL 12 06/24/2018 1725   LDLCALC 42 11/28/2022 1345   LDLDIRECT <4 06/24/2018 1725      Wt Readings from Last 3 Encounters:  06/14/23 265 lb 4 oz (120.3 kg)  05/15/23 271 lb (122.9 kg)  04/12/23 265 lb (120.2 kg)         No data to display            ASSESSMENT AND PLAN:  1.  Coronary artery disease involving native coronary arteries without angina: He is doing reasonably well overall with no anginal symptoms.  Continue aspirin .   2.  Essential hypertension: He has been out of lisinopril for more than a month.  In spite of that, his blood pressure is reasonably controlled on amlodipine and carvedilol.  Lower extremity edema is likely related to amlodipine.  Thus, I elected to resume lisinopril 40 mg once daily and discontinue amlodipine for now.   3.  Hyperlipidemia: Continue treatment with rosuvastatin 10 mg daily.  Most recent lipid profile showed an LDL of 42.  4.  Tobacco use: He cut down but has not been able to quit completely.  I discussed with him the importance of smoking cessation.      Disposition:   FU with me in 6 months  Signed,  Lorine Bears, MD  06/14/2023 11:17 AM    [facility]

## 2023-06-19 ENCOUNTER — Other Ambulatory Visit: Payer: Self-pay

## 2023-06-19 ENCOUNTER — Other Ambulatory Visit: Payer: Self-pay | Admitting: Internal Medicine

## 2023-06-20 ENCOUNTER — Other Ambulatory Visit: Payer: Self-pay

## 2023-06-20 ENCOUNTER — Encounter: Payer: Self-pay | Admitting: Internal Medicine

## 2023-06-20 MED ORDER — SEMAGLUTIDE-WEIGHT MANAGEMENT 0.5 MG/0.5ML ~~LOC~~ SOAJ
0.5000 mg | SUBCUTANEOUS | 0 refills | Status: DC
Start: 2023-06-20 — End: 2023-09-16
  Filled 2023-06-20 – 2023-06-25 (×2): qty 2, 28d supply, fill #0
  Filled 2023-07-18: qty 2, 28d supply, fill #1
  Filled 2023-08-15: qty 2, 28d supply, fill #2

## 2023-06-20 NOTE — Telephone Encounter (Signed)
Requested medication (s) are due for refill today: na  Requested medication (s) are on the active medication list: yes   Last refill:  05/25/23 #2  ml 0 refills  Future visit scheduled: yes in 2 days   Notes to clinic:  no refills remain. Do you want to refill Rx dose today?     Requested Prescriptions  Pending Prescriptions Disp Refills   Semaglutide-Weight Management (WEGOVY) 0.25 MG/0.5ML SOAJ 2 mL 0    Sig: Inject 0.25 mg into the skin once a week.     Endocrinology:  Diabetes - GLP-1 Receptor Agonists - semaglutide Failed - 06/19/2023  5:40 AM      Failed - HBA1C in normal range and within 180 days    Hgb A1c MFr Bld  Date Value Ref Range Status  11/28/2022 5.5 <5.7 % of total Hgb Final    Comment:    For the purpose of screening for the presence of diabetes: . <5.7%       Consistent with the absence of diabetes 5.7-6.4%    Consistent with increased risk for diabetes             (prediabetes) > or =6.5%  Consistent with diabetes . This assay result is consistent with a decreased risk of diabetes. . Currently, no consensus exists regarding use of hemoglobin A1c for diagnosis of diabetes in children. . According to American Diabetes Association (ADA) guidelines, hemoglobin A1c <7.0% represents optimal control in non-pregnant diabetic patients. Different metrics may apply to specific patient populations.  Standards of Medical Care in Diabetes(ADA). .          Failed - Cr in normal range and within 360 days    Creat  Date Value Ref Range Status  05/15/2023 0.69 (L) 0.70 - 1.35 mg/dL Final         Passed - Valid encounter within last 6 months    Recent Outpatient Visits           1 month ago Mixed hyperlipidemia   [place] [facility] [place], Salvadore Oxford, NP   6 months ago Mixed hyperlipidemia   [place] [place], Salvadore Oxford, NP   1 year ago [employer] preventive visit   [place] [facility] [place], Salvadore Oxford, NP   1 year ago Acute nasopharyngitis   [facility] [place], Salvadore Oxford, NP   2 years ago Flu-like symptoms   [facility] [place], Salvadore Oxford, NP       Future Appointments             In 2 days [place], Salvadore Oxford, NP [place] [facility]   In 4 months [place], Salvadore Oxford, NP [facility], [place]

## 2023-06-21 ENCOUNTER — Other Ambulatory Visit (HOSPITAL_COMMUNITY): Payer: Self-pay

## 2023-06-21 ENCOUNTER — Other Ambulatory Visit: Payer: Self-pay

## 2023-06-22 ENCOUNTER — Ambulatory Visit (INDEPENDENT_AMBULATORY_CARE_PROVIDER_SITE_OTHER): Payer: Medicare Other | Admitting: Internal Medicine

## 2023-06-22 ENCOUNTER — Encounter: Payer: Self-pay | Admitting: Internal Medicine

## 2023-06-22 ENCOUNTER — Other Ambulatory Visit: Payer: Self-pay

## 2023-06-22 VITALS — BP 138/78 | Ht 74.0 in | Wt 266.8 lb

## 2023-06-22 DIAGNOSIS — F1120 Opioid dependence, uncomplicated: Secondary | ICD-10-CM | POA: Insufficient documentation

## 2023-06-22 DIAGNOSIS — Z981 Arthrodesis status: Secondary | ICD-10-CM | POA: Diagnosis not present

## 2023-06-22 DIAGNOSIS — G8929 Other chronic pain: Secondary | ICD-10-CM

## 2023-06-22 DIAGNOSIS — M5441 Lumbago with sciatica, right side: Secondary | ICD-10-CM

## 2023-06-22 MED ORDER — HYDROCODONE-ACETAMINOPHEN 5-325 MG PO TABS
1.0000 | ORAL_TABLET | Freq: Four times a day (QID) | ORAL | 0 refills | Status: DC | PRN
  Filled 2023-06-22: qty 60, 15d supply, fill #0

## 2023-06-22 NOTE — Patient Instructions (Signed)

## 2023-06-22 NOTE — Progress Notes (Signed)
Subjective:    Patient ID: Bryan Catalina., male    DOB: 1955-09-12, 67 y.o.   MRN: [id number]  HPI  Discussed the use of AI scribe software for clinical note transcription with the patient, who gave verbal consent to proceed.   The patient, with a history of chronic back pain, presents with concerns about their increasing dependence on narcotics. They have been taking 60 tablets of hydrocodone every 14 days for the past year, prescribed by their orthopedic surgeon, Dr. Yevette Edwards The patient's back pain began a year ago, and they have since undergone multiple injections and a right-sided L4, L5 transforaminal lumbar interbody fusion with instrumentation and allograft 04/2023. Despite the surgery, the patient reports persistent pain, likening it to "an elephant standing on my back." They also report numbness and burning in their right leg if standing for more than 12 minutes.  Previous MRI from 03/2023 showed: IMPRESSION: 1. No acute findings or clear explanation for the patient's symptoms. 2. The previously demonstrated extraforaminal disc extrusion on the right at L5-S1 has partially involuted with possible residual extraforaminal right L5 nerve root encroachment. 3. Stable mild to moderate multifactorial spinal stenosis at L4-5 with bilateral lateral recess narrowing and mild-to-moderate right foraminal narrowing. 4. No other significant spinal stenosis or nerve root encroachment.IMPRESSION:  The patient has previously experienced withdrawal symptoms from narcotics, describing it as "pure hell," with symptoms including cramping, diarrhea, and a sensation of electricity shooting through their body. They attempted to self-wean from four to two tablets daily but experienced withdrawal symptoms and returned to their usual dosage.  In addition to hydrocodone, the patient is also taking flexeril and lyrica for pain management. They express concern about the potential for their pain medication to  be abruptly discontinued and are seeking a referral to a pain management specialist.       Review of Systems   Past Medical History:  Diagnosis Date   Alcohol abuse    a. up to a 12 pack of beer/day. (hx of, not currently) 04/10/23   Arthritis    CAD in native artery    a. LHC 6/18: severe 1 vessel CAD with 90% thrombotic stenosis in the proximal LAD which was felt to be the culprit for the patient's non-STEMI. He underwent successful PCI/DES with a resolute onyx drug-eluting stent (3.0 x 18 mm). There was 0% residual stenosis. LVEF estimated at 55-65% by visual estimate. LVEDP was mildly elevated   Centrilobular emphysema (HCC)    Dyspnea    ED (erectile dysfunction)    GERD (gastroesophageal reflux disease)    H/O echocardiogram    a. echo 6/18: EF 60-65%, normal wall motion, grade 1 diastolic dysfunction, RV cavity size was normal with normal wall thickness and normal RV systolic function. There were no significant valvular abnormalities   Hearing loss    HLD (hyperlipidemia)    Hypertension    Marijuana abuse    hx of. Not currently (04/10/23)   NSTEMI (non-ST elevated myocardial infarction) (HCC)    a. LHC 6/18: severe 1 vessel CAD with 90% thrombotic stenosis in the proximal LAD which was felt to be the culprit for the patient's non-STEMI. He underwent successful PCI/DES with a resolute onyx drug-eluting stent (3.0 x 18 mm). There was 0% residual stenosis. LVEF estimated at 55-65% by visual estimate. LVEDP was mildly elevated   Senile purpura (HCC)    Spinal stenosis    Tobacco abuse     Current Outpatient Medications  Medication Sig Dispense  Refill   albuterol (VENTOLIN HFA) 108 (90 Base) MCG/ACT inhaler TAKE 2 PUFFS BY MOUTH EVERY 6 HOURS AS NEEDED FOR WHEEZE OR SHORTNESS OF BREATH 8.5 each 2   aspirin EC 81 MG tablet Take 1 tablet (81 mg total) by mouth daily. Swallow whole.     Budeson-Glycopyrrol-Formoterol (BREZTRI AEROSPHERE) 160-9-4.8 MCG/ACT AERO Inhale 2 puffs into  the lungs 2 (two) times daily. 10.7 g 3   carvedilol (COREG) 6.25 MG tablet TAKE 1 TABLET(6.25 MG) BY MOUTH TWICE DAILY WITH A MEAL (Patient taking differently: 6.25 mg 2 (two) times daily with a meal.) 180 tablet 1   cyclobenzaprine (FLEXERIL) 10 MG tablet Take 5-10 mg by mouth as needed.     cyclobenzaprine (FLEXERIL) 5 MG tablet Take 1-2 tablets (5-10 mg total) by mouth 3 (three) times daily as needed. 60 tablet 0   Emollient (DERMEND FRAGILE SKIN EX) Apply 1 Application topically daily as needed (bruising).     HYDROcodone-acetaminophen (NORCO/VICODIN) 5-325 MG tablet Take 1 tablet by mouth every 6 (six) to 8 (eight) hours as needed. 60 tablet 0   HYDROcodone-acetaminophen (NORCO/VICODIN) 5-325 MG tablet Take 1 tablet by mouth every 6 (six) to 8 (eight) hours as needed for pain (Patient not taking: Reported on 06/14/2023) 60 tablet 0   lisinopril (ZESTRIL) 40 MG tablet Take 1 tablet (40 mg total) by mouth daily. 90 tablet 3   Melatonin 10 MG TABS Take 10 mg by mouth at bedtime.     methocarbamol (ROBAXIN) 500 MG tablet Take 1-2 tablets (500-1,000 mg total) by mouth every 6 (six) hours as needed for muscle spasms. (Patient not taking: Reported on 06/14/2023) 60 tablet 0   montelukast (SINGULAIR) 10 MG tablet TAKE 1 TABLET BY MOUTH EVERYDAY AT BEDTIME 90 tablet 0   omeprazole (PRILOSEC) 40 MG capsule Take 1 capsule (40 mg total) by mouth daily. 90 capsule 0   pregabalin (LYRICA) 150 MG capsule Take 1 capsule (150 mg total) by mouth 2 (two) times daily for leg/nerve pain (Patient not taking: Reported on 06/14/2023) 60 capsule 3   pregabalin (LYRICA) 150 MG capsule Take 1 capsule (150 mg total) by mouth 2 (two) times daily for leg/nerve pain 60 capsule 3   rosuvastatin (CRESTOR) 10 MG tablet TAKE 1 TABLET BY MOUTH EVERY DAY 90 tablet 3   Semaglutide-Weight Management 0.5 MG/0.5ML SOAJ Inject 0.5 mg into the skin once a week. 6 mL 0   vardenafil (LEVITRA) 10 MG tablet Take 1 tablet (10 mg total) by  mouth daily as needed for erectile dysfunction. 10 tablet 5   No current facility-administered medications for this visit.    No Known Allergies  Family History  Problem Relation Age of Onset   Osteoporosis Mother    CAD Father        s/p CABG in his 68's or 33's.   Diabetes Father    Colon polyps Brother    CAD Paternal Grandfather     Social History   Socioeconomic History   Marital status: Divorced    Spouse name: Not on file   Number of children: 1   Years of education: Not on file   Highest education level: 9th grade  Occupational History   Not on file  Tobacco Use   Smoking status: Every Day    Current packs/day: 1.00    Average packs/day: 1 pack/day for 42.0 years (42.0 ttl pk-yrs)    Types: Cigarettes   Smokeless tobacco: Never   Tobacco comments:    About  8 cigarettes per day (04/10/23)  Vaping Use   Vaping status: Never Used  Substance and Sexual Activity   Alcohol use: Not Currently   Drug use: Not Currently   Sexual activity: Yes  Other Topics Concern   Not on file  Social History Narrative   Lives in [place].  He lives in a mobile home in the backyard of his girlfriend.  He works in Aeronautical engineer.   Social Determinants of Health   Financial Resource Strain: Medium Risk (05/12/2023)   Overall Financial Resource Strain (CARDIA)    Difficulty of Paying Living Expenses: Somewhat hard  Food Insecurity: No Food Insecurity (05/12/2023)   Hunger Vital Sign    Worried About Running Out of Food in the Last Year: Never true    Ran Out of Food in the Last Year: Never true  Transportation Needs: No Transportation Needs (05/12/2023)   PRAPARE - Administrator, Civil Service (Medical): No    Lack of Transportation (Non-Medical): No  Physical Activity: Insufficiently Active (05/12/2023)   Exercise Vital Sign    Days of Exercise per Week: 2 days    Minutes of Exercise per Session: 20 min  Stress: Stress Concern Present (05/12/2023)   [employer] of Occupational Health - Occupational Stress Questionnaire    Feeling of Stress : To some extent  Social Connections: Moderately Integrated (05/12/2023)   Social Connection and Isolation Panel [NHANES]    Frequency of Communication with Friends and Family: More than three times a week    Frequency of Social Gatherings with Friends and Family: Twice a week    Attends Religious Services: More than 4 times per year    Active Member of [employer] or Organizations: No    Attends Engineer, structural: Not on file    Marital Status: Living with partner  Intimate Partner Violence: Not on file     Constitutional: Denies fever, malaise, fatigue, headache or abrupt weight changes.  Respiratory: Denies difficulty breathing, shortness of breath, cough or sputum production.   Cardiovascular: Denies chest pain, chest tightness, palpitations or swelling in the hands or feet.  Musculoskeletal: Patient reports chronic low back pain, decrease in range of motion.  Denies difficulty with gait, or joint swelling.  Skin: Denies redness, rashes, lesions or ulcercations.  Neurological: Patient reports numbness and tingling of right lower extremity.  Denies dizziness, difficulty with memory, difficulty with speech or problems with balance and coordination.    No other specific complaints in a complete review of systems (except as listed in HPI above).      Objective:   Physical Exam  BP 138/78   Ht 6\' 2"  (1.88 m)   Wt 266 lb 12.8 oz (121 kg)   BMI 34.26 kg/m   Wt Readings from Last 3 Encounters:  06/14/23 265 lb 4 oz (120.3 kg)  05/15/23 271 lb (122.9 kg)  04/12/23 265 lb (120.2 kg)    General: Appears his stated age, obese in NAD. Cardiovascular: Normal rate and rhythm. S1,S2 noted.  No murmur, rubs or gallops noted.  Pulmonary/Chest: Normal effort and positive vesicular breath sounds. No respiratory distress. No wheezes, rales or ronchi noted.  Musculoskeletal: Decreased flexion,  extension and rotation of the lumbar spine secondary to pain.  Pain with palpation over the lumbar spine.  Wearing back brace today.  He has difficulties getting from a sitting to a standing position complicated by knee pain and crepitus with range of motion.  Strength 5/5 LLE, 4/5 RLE.  Able to stand on tiptoes and heels.  Gait slow and steady without device. Neurological: Alert and oriented.  Sensation intact to BLE.   BMET    Component Value Date/Time   NA 140 05/15/2023 1411   NA 140 10/04/2021 1459   K 4.1 05/15/2023 1411   CL 104 05/15/2023 1411   CO2 27 05/15/2023 1411   GLUCOSE 108 05/15/2023 1411   BUN 10 05/15/2023 1411   BUN 14 10/04/2021 1459   CREATININE 0.69 (L) 05/15/2023 1411   CALCIUM 9.4 05/15/2023 1411   GFRNONAA >60 04/10/2023 0929   GFRNONAA 69 01/30/2020 1115   GFRAA 80 01/30/2020 1115    Lipid Panel     Component Value Date/Time   CHOL 135 11/28/2022 1345   TRIG 181 (H) 11/28/2022 1345   HDL 67 11/28/2022 1345   CHOLHDL 2.0 11/28/2022 1345   VLDL 12 06/24/2018 1725   LDLCALC 42 11/28/2022 1345    CBC    Component Value Date/Time   WBC 7.9 05/15/2023 1411   RBC 4.95 05/15/2023 1411   HGB 14.7 05/15/2023 1411   HGB 16.7 10/04/2021 1459   HCT 45.0 05/15/2023 1411   HCT 49.2 10/04/2021 1459   PLT 240 05/15/2023 1411   PLT 272 10/04/2021 1459   MCV 90.9 05/15/2023 1411   MCV 92 10/04/2021 1459   MCH 29.7 05/15/2023 1411   MCHC 32.7 05/15/2023 1411   RDW 12.6 05/15/2023 1411   RDW 13.1 10/04/2021 1459   LYMPHSABS 2.0 10/04/2021 1459   MONOABS 0.5 08/02/2019 0703   EOSABS 0.1 10/04/2021 1459   BASOSABS 0.1 10/04/2021 1459    Hgb A1C Lab Results  Component Value Date   HGBA1C 5.5 11/28/2022            Assessment & Plan:  Assessment and Plan    Chronic Back Pain Post Lumbar Fusion Persistent pain despite surgery and high dose narcotic use (Hydrocodone 60 tablets every 14 days). Limited range of motion on physical exam. Patient has  tried to self-wean but experienced withdrawal symptoms. -Refer to pain management specialist for further evaluation and management. -Continue current pain regimen until seen by specialist. -Encourage patient to discuss concerns with current orthopedic surgeon, Dr. Yevette Edwards.  Narcotic Dependence Patient has a history of narcotic dependence following knee surgeries and is currently concerned about dependence on Hydrocodone for back pain. -Refer to pain management specialist for evaluation and potential weaning plan. -Advise patient not to self-wean due to risk of withdrawal symptoms.  Postoperative Follow-up Patient had right-sided L4, L5 transforaminal lumbar interbody fusion with instrumentation and Allograft by Dr. Jolayne Panther. -Continue follow-up with Dr. Jolayne Panther as scheduled.      RTC in 5 months for follow-up of chronic conditions Nicki Reaper, NP

## 2023-06-25 ENCOUNTER — Other Ambulatory Visit: Payer: Self-pay

## 2023-06-29 ENCOUNTER — Other Ambulatory Visit: Payer: Self-pay | Admitting: Internal Medicine

## 2023-06-29 DIAGNOSIS — Z0000 Encounter for general adult medical examination without abnormal findings: Secondary | ICD-10-CM

## 2023-07-02 ENCOUNTER — Ambulatory Visit: Payer: Medicare Other | Admitting: Internal Medicine

## 2023-07-02 ENCOUNTER — Other Ambulatory Visit: Payer: Self-pay

## 2023-07-02 MED ORDER — CYCLOBENZAPRINE HCL 10 MG PO TABS
10.0000 mg | ORAL_TABLET | Freq: Three times a day (TID) | ORAL | 2 refills | Status: DC
  Filled 2023-07-02: qty 60, 20d supply, fill #0
  Filled 2023-07-27: qty 60, 20d supply, fill #1
  Filled 2023-08-27: qty 60, 20d supply, fill #2

## 2023-07-02 MED ORDER — METHYLPREDNISOLONE 4 MG PO TBPK
ORAL_TABLET | ORAL | 0 refills | Status: DC
  Filled 2023-07-02: qty 21, 6d supply, fill #0

## 2023-07-02 MED ORDER — HYDROCODONE-ACETAMINOPHEN 5-325 MG PO TABS
1.0000 | ORAL_TABLET | Freq: Three times a day (TID) | ORAL | 0 refills | Status: DC | PRN
  Filled 2023-07-05: qty 60, 10d supply, fill #0

## 2023-07-02 MED ORDER — PREGABALIN 300 MG PO CAPS
300.0000 mg | ORAL_CAPSULE | Freq: Two times a day (BID) | ORAL | 0 refills | Status: DC
  Filled 2023-07-02: qty 60, 30d supply, fill #0

## 2023-07-02 NOTE — Telephone Encounter (Signed)
Requested by interface surescripts. Future visit in 4 months.  Requested Prescriptions  Pending Prescriptions Disp Refills   montelukast (SINGULAIR) 10 MG tablet [Pharmacy Med Name: MONTELUKAST SOD 10 MG TABLET] 90 tablet 0    Sig: TAKE 1 TABLET BY MOUTH EVERYDAY AT BEDTIME     Pulmonology:  Leukotriene Inhibitors Passed - 06/29/2023  1:49 AM      Passed - Valid encounter within last 12 months    Recent Outpatient Visits           1 week ago Chronic midline low back pain with right-sided sciatica   [place] [facility] [place], Salvadore Oxford, NP   1 month ago Mixed hyperlipidemia   [place] [facility] [place], Salvadore Oxford, NP   7 months ago Mixed hyperlipidemia   [place] [facility] [place], Salvadore Oxford, NP   1 year ago [employer] preventive visit   [facility] [place], Salvadore Oxford, NP   1 year ago Acute nasopharyngitis   [place] [facility] [place], Salvadore Oxford, NP       Future Appointments             In 4 months Baity, Salvadore Oxford, NP [place] [facility], [facility]

## 2023-07-03 ENCOUNTER — Other Ambulatory Visit: Payer: Self-pay | Admitting: Internal Medicine

## 2023-07-05 ENCOUNTER — Other Ambulatory Visit: Payer: Self-pay

## 2023-07-05 NOTE — Telephone Encounter (Signed)
Requested Prescriptions  Pending Prescriptions Disp Refills   omeprazole (PRILOSEC) 40 MG capsule [Pharmacy Med Name: OMEPRAZOLE DR 40 MG CAPSULE] 90 capsule 0    Sig: TAKE 1 CAPSULE (40 MG TOTAL) BY MOUTH DAILY.     Gastroenterology: Proton Pump Inhibitors Passed - 07/03/2023  1:27 AM      Passed - Valid encounter within last 12 months    Recent Outpatient Visits           1 week ago Chronic midline low back pain with right-sided sciatica   [place] [place], Salvadore Oxford, NP   1 month ago Mixed hyperlipidemia   [place] [facility] [place], Salvadore Oxford, NP   7 months ago Mixed hyperlipidemia   [place] [facility] [place], Salvadore Oxford, NP   1 year ago Welcome to [employer] preventive visit   [place] [facility] [place], Salvadore Oxford, NP   1 year ago Acute nasopharyngitis   [facility] [place], Salvadore Oxford, NP       Future Appointments             In 4 months Baity, Salvadore Oxford, NP [place] [facility], [facility]

## 2023-07-09 ENCOUNTER — Other Ambulatory Visit: Payer: Self-pay

## 2023-07-12 ENCOUNTER — Ambulatory Visit: Payer: Medicare Other | Attending: Anesthesiology | Admitting: Anesthesiology

## 2023-07-12 ENCOUNTER — Other Ambulatory Visit: Payer: Self-pay

## 2023-07-12 ENCOUNTER — Encounter: Payer: Self-pay | Admitting: Anesthesiology

## 2023-07-12 VITALS — BP 159/83 | HR 155 | Temp 97.1°F | Resp 18 | Ht 74.0 in | Wt 260.0 lb

## 2023-07-12 DIAGNOSIS — M545 Low back pain, unspecified: Secondary | ICD-10-CM | POA: Diagnosis present

## 2023-07-12 DIAGNOSIS — M961 Postlaminectomy syndrome, not elsewhere classified: Secondary | ICD-10-CM | POA: Insufficient documentation

## 2023-07-12 DIAGNOSIS — F1190 Opioid use, unspecified, uncomplicated: Secondary | ICD-10-CM | POA: Insufficient documentation

## 2023-07-12 DIAGNOSIS — M5431 Sciatica, right side: Secondary | ICD-10-CM | POA: Diagnosis present

## 2023-07-12 DIAGNOSIS — G894 Chronic pain syndrome: Secondary | ICD-10-CM | POA: Diagnosis present

## 2023-07-12 DIAGNOSIS — M47816 Spondylosis without myelopathy or radiculopathy, lumbar region: Secondary | ICD-10-CM | POA: Diagnosis present

## 2023-07-12 NOTE — Patient Instructions (Addendum)
Preparing for Procedure with Sedation Instructions: Oral Intake: Do not eat or drink anything for at least 8 hours prior to your procedure. Transportation: Public transportation is not allowed. Bring an adult driver. The driver must be physically present in our waiting room before any procedure can be started. Physical Assistance: Bring an adult capable of physically assisting you, in the event you need help. Blood Pressure Medicine: Take your blood pressure medicine with a sip of water the morning of the procedure. Insulin: Take only  of your normal insulin dose. Preventing infections: Shower with an antibacterial soap the morning of your procedure. Build-up your immune system: Take 1000 mg of Vitamin C with every meal (3 times a day) the day prior to your procedure. Pregnancy: If you are pregnant, call and cancel the procedure. Sickness: If you have a cold, fever, or any active infections, call and cancel the procedure. Arrival: You must be in the facility at least 30 minutes prior to your scheduled procedure. Children: Do not bring children with you. Dress appropriately: Bring dark clothing that you would not mind if they get stained. Valuables: Do not bring any jewelry or valuables. Procedure appointments are reserved for interventional treatments only. No Prescription Refills. No medication changes will be discussed during procedure appointments. No disability issues will be discussed. Epidural Steroid Injection Patient Information  Description: The epidural space surrounds the nerves as they exit the spinal cord.  In some patients, the nerves can be compressed and inflamed by a bulging disc or a tight spinal canal (spinal stenosis).  By injecting steroids into the epidural space, we can bring irritated nerves into direct contact with a potentially helpful medication.  These steroids act directly on the irritated nerves and can reduce swelling and inflammation which often leads to  decreased pain.  Epidural steroids may be injected anywhere along the spine and from the neck to the low back depending upon the location of your pain.   After numbing the skin with local anesthetic (like Novocaine), a small needle is passed into the epidural space slowly.  You may experience a sensation of pressure while this is being done.  The entire block usually last less than 10 minutes.  Conditions which may be treated by epidural steroids:  Low back and leg pain Neck and arm pain Spinal stenosis Post-laminectomy syndrome Herpes zoster (shingles) pain Pain from compression fractures  Preparation for the injection:  Do not eat any solid food or dairy products within 8 hours of your appointment.  You may drink clear liquids up to 3 hours before appointment.  Clear liquids include water, black coffee, juice or soda.  No milk or cream please. You may take your regular medication, including pain medications, with a sip of water before your appointment  Diabetics should hold regular insulin (if taken separately) and take 1/2 normal NPH dos the morning of the procedure.  Carry some sugar containing items with you to your appointment. A driver must accompany you and be prepared to drive you home after your procedure.  Bring all your current medications with your. An IV may be inserted and sedation may be given at the discretion of the physician.   A blood pressure cuff, EKG and other monitors will often be applied during the procedure.  Some patients may need to have extra oxygen administered for a short period. You will be asked to provide medical information, including your allergies, prior to the procedure.  We must know immediately if you are taking blood   thinners (like Coumadin/Warfarin)  Or if you are allergic to IV iodine contrast (dye). We must know if you could possible be pregnant.  Possible side-effects: Bleeding from needle site Infection (rare, may require surgery) Nerve injury  (rare) Numbness & tingling (temporary) Difficulty urinating (rare, temporary) Spinal headache ( a headache worse with upright posture) Light -headedness (temporary) Pain at injection site (several days) Decreased blood pressure (temporary) Weakness in arm/leg (temporary) Pressure sensation in back/neck (temporary)  Call if you experience: Fever/chills associated with headache or increased back/neck pain. Headache worsened by an upright position. New onset weakness or numbness of an extremity below the injection site Hives or difficulty breathing (go to the emergency room) Inflammation or drainage at the infection site Severe back/neck pain Any new symptoms which are concerning to you  Please note:  Although the local anesthetic injected can often make your back or neck feel good for several hours after the injection, the pain will likely return.  It takes 3-7 days for steroids to work in the epidural space.  You may not notice any pain relief for at least that one week.  If effective, we will often do a series of three injections spaced 3-6 weeks apart to maximally decrease your pain.  After the initial series, we generally will wait several months before considering a repeat injection of the same type.  If you have any questions, please call [phone] [facility] 

## 2023-07-12 NOTE — Progress Notes (Signed)
Subjective:  Patient ID: Bryan Ibarra., male    DOB: 1956-01-10  Age: 67 y.o. y.o. MRN: [id number]  CC: Back Pain (lower)     PROCEDURE: None  HPI Bryan Ibarra. presents for a new patient evaluation.  He is a pleasant 67 year old white male with a longstanding history of low back pain that has been present since December 2023.  He underwent low back surgery back in September for a fusion and is having residual low back pain centralized in nature with right posterior lateral severe leg pain of a lancinating nature.  He describes this pain as being worse after activity and with immobility.  Aggravating factors include bending lifting motions sitting standing twisting or walking.  It is alleviated by rest sleeping lying down and medication management.  He currently takes two 5 mg hydrocodone tablets 3 times a day which does help with the pain in addition to Lyrica and Flexeril.  Associated with the pain are numbness affecting the right posterior lateral leg with associated tingling and pain that wakes him up at night.  Unfortunately the pain quality is described as constant heavy nagging and at times unbearable with stabbing qualities.  He was recently seen by the neurosurgeons who are advocating possibly epidural steroid injection in addition to continue physical therapy. [facility] refer the patient for evaluation.  He has a history of increasing reliance on opioid medication for pain relief.  He has had previous injection therapy multiple times and a right-sided L4-L5 transforaminal lumbar interbody fusion done in September.  Despite surgery the patient reported persistent pain with associated numbness burning and tingling affecting the right leg.  From her most recent evaluation they describe no acute findings or clear explanation of the patient's symptoms with impression of possible residual extraforaminal right L5 nerve root encroachment and stable mild to moderate  multifactorial spinal stenosis at L4-5 with bilateral recess lateral narrowing and mild to moderate right foraminal narrowing  History Bryan Ibarra has a past medical history of Alcohol abuse, Arthritis, CAD in native artery, Centrilobular emphysema (HCC), Dyspnea, ED (erectile dysfunction), GERD (gastroesophageal reflux disease), H/O echocardiogram, Hearing loss, HLD (hyperlipidemia), Hypertension, Marijuana abuse, NSTEMI (non-ST elevated myocardial infarction) (HCC), Senile purpura (HCC), Spinal stenosis, and Tobacco abuse.   He has a past surgical history that includes Knee surgery (Left); Appendectomy; LEFT HEART CATH AND CORONARY ANGIOGRAPHY (N/A, 01/01/2017); CORONARY STENT INTERVENTION (N/A, 01/01/2017); Nasal endoscopy (N/A, 08/02/2019); Nasal hemorrhage control (N/A, 08/02/2019); Colonoscopy; and Transforaminal lumbar interbody fusion (tlif) with pedicle screw fixation 1 level (Right, 04/12/2023).   His family history includes CAD in his father and paternal grandfather; Colon polyps in his brother; Diabetes in his father; Osteoporosis in his mother.He reports that he has been smoking cigarettes. He has a 42 pack-year smoking history. He has never used smokeless tobacco. He reports that he does not currently use alcohol. He reports that he does not currently use drugs.  No results found for this or any previous visit.   No results found for: "TOXASSSELUR"  Outpatient Medications Prior to Visit  Medication Sig Dispense Refill   albuterol (VENTOLIN HFA) 108 (90 Base) MCG/ACT inhaler TAKE 2 PUFFS BY MOUTH EVERY 6 HOURS AS NEEDED FOR WHEEZE OR SHORTNESS OF BREATH 8.5 each 2   aspirin EC 81 MG tablet Take 1 tablet (81 mg total) by mouth daily. Swallow whole.     Budeson-Glycopyrrol-Formoterol (BREZTRI AEROSPHERE) 160-9-4.8 MCG/ACT AERO Inhale 2 puffs into the lungs 2 (two) times daily. 10.7 g  3   carvedilol (COREG) 6.25 MG tablet TAKE 1 TABLET(6.25 MG) BY MOUTH TWICE DAILY WITH A MEAL (Patient taking  differently: 6.25 mg 2 (two) times daily with a meal.) 180 tablet 1   cyclobenzaprine (FLEXERIL) 10 MG tablet Take 1 tablet (10 mg total) by mouth every 8 (eight) hours. 60 tablet 2   HYDROcodone-acetaminophen (NORCO/VICODIN) 5-325 MG tablet Take 1 tablet by mouth every 6 (six) to 8 (eight) hours as needed for pain 60 tablet 0   HYDROcodone-acetaminophen (NORCO/VICODIN) 5-325 MG tablet Take 1 tablet by mouth every 6 to 8 hours as needed for pain 60 tablet 0   HYDROcodone-acetaminophen (NORCO/VICODIN) 5-325 MG tablet Take 1-2 tablets by mouth every 8 (eight) hours as needed for pain. 60 tablet 0   lisinopril (ZESTRIL) 40 MG tablet Take 1 tablet (40 mg total) by mouth daily. 90 tablet 3   Melatonin 10 MG TABS Take 10 mg by mouth at bedtime.     montelukast (SINGULAIR) 10 MG tablet TAKE 1 TABLET BY MOUTH EVERYDAY AT BEDTIME 90 tablet 0   omeprazole (PRILOSEC) 40 MG capsule TAKE 1 CAPSULE (40 MG TOTAL) BY MOUTH DAILY. 90 capsule 0   pregabalin (LYRICA) 300 MG capsule Take 1 capsule (300 mg total) by mouth 2 (two) times daily. 60 capsule 0   rosuvastatin (CRESTOR) 10 MG tablet TAKE 1 TABLET BY MOUTH EVERY DAY 90 tablet 3   Semaglutide-Weight Management 0.5 MG/0.5ML SOAJ Inject 0.5 mg into the skin once a week. 6 mL 0   vardenafil (LEVITRA) 10 MG tablet Take 1 tablet (10 mg total) by mouth daily as needed for erectile dysfunction. 10 tablet 5   cyclobenzaprine (FLEXERIL) 10 MG tablet Take 5-10 mg by mouth as needed. (Patient not taking: Reported on 07/12/2023)     cyclobenzaprine (FLEXERIL) 5 MG tablet Take 1-2 tablets (5-10 mg total) by mouth 3 (three) times daily as needed. (Patient not taking: Reported on 07/12/2023) 60 tablet 0   methylPREDNISolone (MEDROL) 4 MG TBPK tablet Take as prescribed on dose pack over 6 days (Patient not taking: Reported on 07/12/2023) 21 tablet 0   pregabalin (LYRICA) 150 MG capsule Take 1 capsule (150 mg total) by mouth 2 (two) times daily for leg/nerve pain (Patient not  taking: Reported on 07/12/2023) 60 capsule 3   No facility-administered medications prior to visit.   Lab Results  Component Value Date   WBC 7.9 05/15/2023   HGB 14.7 05/15/2023   HCT 45.0 05/15/2023   PLT 240 05/15/2023   GLUCOSE 108 05/15/2023   CHOL 135 11/28/2022   TRIG 181 (H) 11/28/2022   HDL 67 11/28/2022   LDLDIRECT <4 06/24/2018   LDLCALC 42 11/28/2022   ALT 17 05/15/2023   AST 13 05/15/2023   NA 140 05/15/2023   K 4.1 05/15/2023   CL 104 05/15/2023   CREATININE 0.69 (L) 05/15/2023   BUN 10 05/15/2023   CO2 27 05/15/2023   TSH 0.89 01/30/2020   PSA 2.25 05/16/2022   INR 0.9 08/02/2019   HGBA1C 5.5 11/28/2022    --------------------------------------------------------------------------------------------------------------------- DG Lumbar Spine 2-3 Views Result Date: 04/12/2023 CLINICAL DATA:  L4-L5 fusion EXAM: LUMBAR SPINE - 2-3 VIEW COMPARISON:  Lumbar spine MRI 03/12/2023 FINDINGS: Two C-arm fluoroscopic images were obtained intraoperatively and submitted for post operative interpretation. Postsurgical changes reflecting L4-L5 posterior instrumented fusion with interbody spacer placement are noted. Hardware alignment is within expected limits, without evidence of immediate complication. Fluoro time 43 seconds, dose 41.14 mGy. Please see the performing provider's  procedural report for further detail. IMPRESSION: Intraoperative images during L4-L5 fusion. Electronically Signed   By: Lesia Hausen M.D.   On: 04/12/2023 14:06   DG Lumbar Spine 1 View Result Date: 04/12/2023 CLINICAL DATA:  Intraoperative imaging for localization EXAM: LUMBAR SPINE - 1 VIEW COMPARISON:  Lumbar spine MRI 03/02/2023 FINDINGS: A single lateral image of the lumbar spine was obtained. The lowest formed disc space is designated L5-S1 which is in keeping with prior lumbar spine MRI. Two surgical markers are noted. The more superior marker is posterior to the L3-L4 disc space, and the more inferior  marker is posterior to the L5-S1 disc space. IMPRESSION: Intraoperative imaging for localization as above. Electronically Signed   By: Lesia Hausen M.D.   On: 04/12/2023 14:05   DG C-Arm 1-60 Min-No Report Result Date: 04/12/2023 Fluoroscopy was utilized by the requesting physician.  No radiographic interpretation.   DG C-Arm 1-60 Min-No Report Result Date: 04/12/2023 Fluoroscopy was utilized by the requesting physician.  No radiographic interpretation.   DG C-Arm 1-60 Min-No Report Result Date: 04/12/2023 Fluoroscopy was utilized by the requesting physician.  No radiographic interpretation.       ---------------------------------------------------------------------------------------------------------------------- Past Medical History:  Diagnosis Date   Alcohol abuse    a. up to a 12 pack of beer/day. (hx of, not currently) 04/10/23   Arthritis    CAD in native artery    a. LHC 6/18: severe 1 vessel CAD with 90% thrombotic stenosis in the proximal LAD which was felt to be the culprit for the patient's non-STEMI. He underwent successful PCI/DES with a resolute onyx drug-eluting stent (3.0 x 18 mm). There was 0% residual stenosis. LVEF estimated at 55-65% by visual estimate. LVEDP was mildly elevated   Centrilobular emphysema (HCC)    Dyspnea    ED (erectile dysfunction)    GERD (gastroesophageal reflux disease)    H/O echocardiogram    a. echo 6/18: EF 60-65%, normal wall motion, grade 1 diastolic dysfunction, RV cavity size was normal with normal wall thickness and normal RV systolic function. There were no significant valvular abnormalities   Hearing loss    HLD (hyperlipidemia)    Hypertension    Marijuana abuse    hx of. Not currently (04/10/23)   NSTEMI (non-ST elevated myocardial infarction) (HCC)    a. LHC 6/18: severe 1 vessel CAD with 90% thrombotic stenosis in the proximal LAD which was felt to be the culprit for the patient's non-STEMI. He underwent successful PCI/DES with  a resolute onyx drug-eluting stent (3.0 x 18 mm). There was 0% residual stenosis. LVEF estimated at 55-65% by visual estimate. LVEDP was mildly elevated   Senile purpura (HCC)    Spinal stenosis    Tobacco abuse     Past Surgical History:  Procedure Laterality Date   APPENDECTOMY     COLONOSCOPY     CORONARY STENT INTERVENTION N/A 01/01/2017   Procedure: Coronary Stent Intervention;  Surgeon: Iran Ouch, MD;  Location: [facility] INVASIVE CV LAB;  Service: Cardiovascular;  Laterality: N/A;   KNEE SURGERY Left    x2 (repairs)   LEFT HEART CATH AND CORONARY ANGIOGRAPHY N/A 01/01/2017   Procedure: Left Heart Cath and Coronary Angiography;  Surgeon: Iran Ouch, MD;  Location: [facility] INVASIVE CV LAB;  Service: Cardiovascular;  Laterality: N/A;   NASAL ENDOSCOPY N/A 08/02/2019   Procedure: NASAL ENDOSCOPY;  Surgeon: Tamela Gammon, MD;  Location: [facility] ORS;  Service: ENT;  Laterality: N/A;   NASAL HEMORRHAGE CONTROL N/A  08/02/2019   Procedure: EPISTAXIS CONTROL;  Surgeon: Tamela Gammon, MD;  Location: [facility] ORS;  Service: ENT;  Laterality: N/A;   TRANSFORAMINAL LUMBAR INTERBODY FUSION (TLIF) WITH PEDICLE SCREW FIXATION 1 LEVEL Right 04/12/2023   Procedure: RIGHT-SIDED LUMBAR 4 - LUMBAR 5 TRANSFORAMINAL LUMBAR INTERBODY FUSION WITH INSTRUMENTATION AND ALLOGRAFT;  Surgeon: Estill Bamberg, MD;  Location: [facility] OR;  Service: Orthopedics;  Laterality: Right;    Family History  Problem Relation Age of Onset   Osteoporosis Mother    CAD Father        s/p CABG in his 57's or 75's.   Diabetes Father    Colon polyps Brother    CAD Paternal Grandfather     Social History   Tobacco Use   Smoking status: Every Day    Current packs/day: 1.00    Average packs/day: 1 pack/day for 42.0 years (42.0 ttl pk-yrs)    Types: Cigarettes   Smokeless tobacco: Never   Tobacco comments:    About 8 cigarettes per day (04/10/23)  Substance Use Topics   Alcohol use: Not Currently     ---------------------------------------------------------------------------------------------------------------------  Scheduled Meds: Continuous Infusions: PRN Meds:.   BP (!) 159/83   Pulse (!) 155   Temp (!) 97.1 F (36.2 C) (Temporal)   Resp 18   Ht 6\' 2"  (1.88 m)   Wt 260 lb (117.9 kg)   SpO2 95%   BMI 33.38 kg/m    BP Readings from Last 3 Encounters:  07/12/23 (!) 159/83  06/22/23 138/78  06/14/23 (!) 138/90     Wt Readings from Last 3 Encounters:  07/12/23 260 lb (117.9 kg)  06/22/23 266 lb 12.8 oz (121 kg)  06/14/23 265 lb 4 oz (120.3 kg)     ----------------------------------------------------------------------------------------------------------------------  ROS Review of Systems CNS no headaches nausea or photophobia Cardiac: No angina or palpitations Pulmonary: No shortness of breath or wheezing Musculoskeletal: As above  Objective:  BP (!) 159/83   Pulse (!) 155   Temp (!) 97.1 F (36.2 C) (Temporal)   Resp 18   Ht 6\' 2"  (1.88 m)   Wt 260 lb (117.9 kg)   SpO2 95%   BMI 33.38 kg/m   Physical Exam Patient is alert oriented cooperative compliant.  HEENT is with pupils equally round reactive to light and extraocular ocular muscles are intact. Heart is regular rate and rhythm Lungs are distantly clear to auscultation with no wheezing or rales Musculoskeletal: Patient has significant pain in the lumbar paraspinous muscle with a well-healed midline scar.  No evidence of any erythema or edema.  He has a tenderness over the right superior posterior iliac crest and with the patient in the supine position has a positive straight leg raise at about 30 degrees negative on the left side.  Strength is 5/5 distal at the ankles to flexion extension.  He does have diminished strength I would rate this at 5-/5 to flexion at the right knee extension at the right knee is intact at 5/5 and his strength is intact on the left side at 5/5 at the left knee.  He has  pain with extension while standing at the low back.  Right lateral rotation exacerbates this more so than left lateral rotation.     Assessment & Plan:   Bryan Ibarra" was seen today for back pain.  Diagnoses and all orders for this visit:  Chronic pain syndrome -     ToxASSURE Select 13 (MW), Urine  Chronic, continuous use of opioids -  ToxASSURE Select 13 (MW), Urine  Post laminectomy syndrome  Facet arthritis of lumbar region  Low back pain at multiple sites  Sciatica of right side -     Lumbar Epidural Injection; Future     ----------------------------------------------------------------------------------------------------------------------  Problem List Items Addressed This Visit       Unprioritized   Chronic pain syndrome - Primary   Relevant Orders   ToxASSURE Select 13 (MW), Urine   Chronic, continuous use of opioids   Relevant Orders   ToxASSURE Select 13 (MW), Urine   Facet arthritis of lumbar region   Low back pain at multiple sites   Post laminectomy syndrome   Other Visit Diagnoses       Sciatica of right side       Relevant Orders   Lumbar Epidural Injection       ----------------------------------------------------------------------------------------------------------------------  1. Chronic pain syndrome (Primary) We requested a urine drug screen today to assist with chronic opioid management while he is healing from his surgery.  I will defer to his surgical team for further management of the surgical condition.  I think would be appropriate to reinstate his hydrocodone use at to the 5 mg tablets 3 times a day.  We will await the urine screen in advance of this.  We gone over the clinic narcotic contract policy and questions have been answered regarding this.  Patient understands the risks and benefits of chronic opioid management. - ToxASSURE Select 13 (MW), Urine  2. Chronic, continuous use of opioids As above - ToxASSURE Select  13 (MW), Urine  3. Post laminectomy syndrome As above and continue follow-up with his neurosurgical team as requested  4. Facet arthritis of lumbar region Continue physical therapy and core stretching.  Discontinue cigarette smoking as reviewed today in addition to efforts at weight loss.  5. Low back pain at multiple sites TENS unit application and physical therapy  6. Sciatica of right side We will schedule him for a caudal epidural steroid as explained in detail today.  Hopefully this can help with some of the residual neuralgic characteristics that he is experiencing.  Plan on doing as soon as we are cleared for this. - Lumbar Epidural Injection; Future    ----------------------------------------------------------------------------------------------------------------------  I am having Bryan Ibarra. "Bryan Ibarra" maintain his Melatonin, carvedilol, Breztri Aerosphere, albuterol, aspirin EC, vardenafil, cyclobenzaprine, pregabalin, HYDROcodone-acetaminophen, rosuvastatin, cyclobenzaprine, lisinopril, Semaglutide-Weight Management, HYDROcodone-acetaminophen, montelukast, cyclobenzaprine, methylPREDNISolone, HYDROcodone-acetaminophen, pregabalin, and omeprazole.   No orders of the defined types were placed in this encounter.      Follow-up: No follow-ups on file.    Yevette Edwards, MD 3:26 PM  The [place] practitioner database for opioid medications on this patient has been reviewed by me and my staff   Greater than 50% of the total encounter time was spent in counseling and / or coordination of care.     This dictation was performed utilizing Conservation officer, historic buildings.  Please excuse any unintentional or mistaken typographical errors as a result.

## 2023-07-13 ENCOUNTER — Other Ambulatory Visit: Payer: Self-pay

## 2023-07-13 ENCOUNTER — Other Ambulatory Visit: Payer: Self-pay | Admitting: Internal Medicine

## 2023-07-13 MED ORDER — HYDROCODONE-ACETAMINOPHEN 5-325 MG PO TABS
1.0000 | ORAL_TABLET | Freq: Three times a day (TID) | ORAL | 0 refills | Status: DC | PRN
  Filled 2023-07-13: qty 60, 10d supply, fill #0

## 2023-07-13 NOTE — Telephone Encounter (Signed)
Requested Prescriptions  Pending Prescriptions Disp Refills   albuterol (VENTOLIN HFA) 108 (90 Base) MCG/ACT inhaler [Pharmacy Med Name: ALBUTEROL HFA (PROAIR) INHALER] 8.5 each 2    Sig: TAKE 2 PUFFS BY MOUTH EVERY 6 HOURS AS NEEDED FOR WHEEZE OR SHORTNESS OF BREATH     Pulmonology:  Beta Agonists 2 Failed - 07/13/2023  9:20 AM      Failed - Last BP in normal range    BP Readings from Last 1 Encounters:  07/12/23 (!) 159/83         Failed - Last Heart Rate in normal range    Pulse Readings from Last 1 Encounters:  07/12/23 (!) 155         Passed - Valid encounter within last 12 months    Recent Outpatient Visits           3 weeks ago Chronic midline low back pain with right-sided sciatica   [place] [facility] [place], Salvadore Oxford, NP   1 month ago Mixed hyperlipidemia   [place] [facility] [place], Salvadore Oxford, NP   7 months ago Mixed hyperlipidemia   [place] [facility] [place], Salvadore Oxford, NP   1 year ago [facility] [employer] preventive visit   [place] [facility] [place], Salvadore Oxford, NP   1 year ago Acute nasopharyngitis   [place] [facility] [place], Salvadore Oxford, NP       Future Appointments             In 3 months [place], Salvadore Oxford, NP [place] [facility], [facility]

## 2023-07-17 ENCOUNTER — Ambulatory Visit
Admission: RE | Admit: 2023-07-17 | Discharge: 2023-07-17 | Disposition: A | Discharge status: Home / Self Care | Payer: Medicare Other | Source: Ambulatory Visit | Attending: Acute Care | Admitting: Acute Care

## 2023-07-17 DIAGNOSIS — F17210 Nicotine dependence, cigarettes, uncomplicated: Secondary | ICD-10-CM | POA: Diagnosis present

## 2023-07-17 DIAGNOSIS — Z87891 Personal history of nicotine dependence: Secondary | ICD-10-CM | POA: Diagnosis present

## 2023-07-17 DIAGNOSIS — Z122 Encounter for screening for malignant neoplasm of respiratory organs: Secondary | ICD-10-CM | POA: Diagnosis present

## 2023-07-21 ENCOUNTER — Other Ambulatory Visit: Payer: Self-pay

## 2023-07-23 ENCOUNTER — Other Ambulatory Visit: Payer: Self-pay

## 2023-07-23 MED ORDER — HYDROCODONE-ACETAMINOPHEN 5-325 MG PO TABS
1.0000 | ORAL_TABLET | Freq: Three times a day (TID) | ORAL | 0 refills | Status: DC | PRN
  Filled 2023-07-23: qty 60, 10d supply, fill #0

## 2023-07-24 ENCOUNTER — Ambulatory Visit: Payer: Medicare Other

## 2023-07-27 ENCOUNTER — Encounter: Payer: Self-pay | Admitting: Internal Medicine

## 2023-07-27 ENCOUNTER — Other Ambulatory Visit: Payer: Self-pay

## 2023-07-30 ENCOUNTER — Other Ambulatory Visit: Payer: Self-pay

## 2023-07-30 DIAGNOSIS — Z87891 Personal history of nicotine dependence: Secondary | ICD-10-CM

## 2023-07-30 DIAGNOSIS — Z122 Encounter for screening for malignant neoplasm of respiratory organs: Secondary | ICD-10-CM

## 2023-07-30 DIAGNOSIS — F17210 Nicotine dependence, cigarettes, uncomplicated: Secondary | ICD-10-CM

## 2023-07-31 ENCOUNTER — Other Ambulatory Visit: Payer: Self-pay

## 2023-08-01 ENCOUNTER — Other Ambulatory Visit: Payer: Self-pay

## 2023-08-02 ENCOUNTER — Other Ambulatory Visit: Payer: Self-pay

## 2023-08-02 DIAGNOSIS — M5441 Lumbago with sciatica, right side: Secondary | ICD-10-CM | POA: Diagnosis not present

## 2023-08-02 DIAGNOSIS — R262 Difficulty in walking, not elsewhere classified: Secondary | ICD-10-CM | POA: Diagnosis not present

## 2023-08-03 ENCOUNTER — Other Ambulatory Visit: Payer: Self-pay

## 2023-08-03 MED ORDER — HYDROCODONE-ACETAMINOPHEN 5-325 MG PO TABS
1.0000 | ORAL_TABLET | Freq: Three times a day (TID) | ORAL | 0 refills | Status: DC | PRN
  Filled 2023-08-03: qty 60, 20d supply, fill #0

## 2023-08-06 ENCOUNTER — Ambulatory Visit: Payer: Medicare Other | Admitting: Anesthesiology

## 2023-08-07 DIAGNOSIS — M5441 Lumbago with sciatica, right side: Secondary | ICD-10-CM | POA: Diagnosis not present

## 2023-08-07 DIAGNOSIS — R262 Difficulty in walking, not elsewhere classified: Secondary | ICD-10-CM | POA: Diagnosis not present

## 2023-08-08 DIAGNOSIS — R262 Difficulty in walking, not elsewhere classified: Secondary | ICD-10-CM | POA: Diagnosis not present

## 2023-08-08 DIAGNOSIS — M5441 Lumbago with sciatica, right side: Secondary | ICD-10-CM | POA: Diagnosis not present

## 2023-08-13 ENCOUNTER — Other Ambulatory Visit: Payer: Self-pay

## 2023-08-13 DIAGNOSIS — M545 Low back pain, unspecified: Secondary | ICD-10-CM | POA: Diagnosis not present

## 2023-08-13 DIAGNOSIS — M533 Sacrococcygeal disorders, not elsewhere classified: Secondary | ICD-10-CM | POA: Diagnosis not present

## 2023-08-13 MED ORDER — HYDROCODONE-ACETAMINOPHEN 5-325 MG PO TABS
1.0000 | ORAL_TABLET | Freq: Three times a day (TID) | ORAL | 0 refills | Status: DC | PRN
  Filled 2023-08-13 – 2023-09-06 (×14): qty 60, 20d supply, fill #0
  Filled ????-??-??: fill #0

## 2023-08-14 ENCOUNTER — Other Ambulatory Visit: Payer: Self-pay

## 2023-08-15 ENCOUNTER — Other Ambulatory Visit: Payer: Self-pay

## 2023-08-16 ENCOUNTER — Other Ambulatory Visit: Payer: Self-pay

## 2023-08-17 ENCOUNTER — Other Ambulatory Visit: Payer: Self-pay

## 2023-08-17 MED ORDER — HYDROCODONE-ACETAMINOPHEN 5-325 MG PO TABS
1.0000 | ORAL_TABLET | Freq: Four times a day (QID) | ORAL | 0 refills | Status: DC | PRN
  Filled 2023-08-17 – 2023-08-20 (×2): qty 60, 15d supply, fill #0

## 2023-08-20 ENCOUNTER — Other Ambulatory Visit: Payer: Self-pay

## 2023-08-21 ENCOUNTER — Telehealth: Payer: Medicare Other | Admitting: Anesthesiology

## 2023-08-21 ENCOUNTER — Ambulatory Visit: Payer: Medicare Other | Admitting: Anesthesiology

## 2023-08-21 ENCOUNTER — Other Ambulatory Visit: Payer: Self-pay | Admitting: Orthopedic Surgery

## 2023-08-21 DIAGNOSIS — M533 Sacrococcygeal disorders, not elsewhere classified: Secondary | ICD-10-CM

## 2023-08-22 ENCOUNTER — Encounter (HOSPITAL_COMMUNITY): Payer: Self-pay

## 2023-08-22 ENCOUNTER — Telehealth: Payer: Self-pay

## 2023-08-22 ENCOUNTER — Other Ambulatory Visit (HOSPITAL_COMMUNITY): Payer: Self-pay

## 2023-08-22 NOTE — Telephone Encounter (Signed)
Called [employer] to complete PA by phone -[phone]. Ref # [id number] Will be notified by fax with the outcome.

## 2023-08-22 NOTE — Telephone Encounter (Signed)
Bryan Ibarra (Key: [id number]) Rx #: [id number] NFAOZH 0.5MG /0.5ML auto-injectors Form [employer] Medicare Part D Electronic Prior Authorization Form [phone] NCPDP)

## 2023-08-22 NOTE — Telephone Encounter (Addendum)
[employer] informed patient medication was denied.and would like to know if there's an alternate and what's the next step.

## 2023-08-22 NOTE — Telephone Encounter (Signed)
I have completed an appeal including his cardiovascular dx. Waiting on a response.

## 2023-08-24 ENCOUNTER — Telehealth: Payer: Self-pay

## 2023-08-24 NOTE — Telephone Encounter (Signed)
Prior auth follow up

## 2023-08-27 ENCOUNTER — Other Ambulatory Visit: Payer: Self-pay

## 2023-08-27 ENCOUNTER — Telehealth: Payer: Self-pay

## 2023-08-27 ENCOUNTER — Other Ambulatory Visit (HOSPITAL_COMMUNITY): Payer: Self-pay

## 2023-08-27 ENCOUNTER — Encounter (HOSPITAL_COMMUNITY): Payer: Self-pay

## 2023-08-27 MED ORDER — AMLODIPINE BESYLATE 5 MG PO TABS: 5.0000 mg | ORAL_TABLET | Freq: Every day | ORAL | 1 refills | Status: DC

## 2023-08-27 MED ORDER — ALBUTEROL SULFATE HFA 108 (90 BASE) MCG/ACT IN AERS: 2.0000 | INHALATION_SPRAY | Freq: Four times a day (QID) | RESPIRATORY_TRACT | 2 refills | Status: DC | PRN

## 2023-08-27 NOTE — Telephone Encounter (Signed)
Can we verify that he is taking this?  It was discontinued on 1114/2024 by a nurse.

## 2023-08-28 ENCOUNTER — Other Ambulatory Visit: Payer: Self-pay

## 2023-08-29 ENCOUNTER — Ambulatory Visit
Admission: RE | Admit: 2023-08-29 | Discharge: 2023-08-29 | Disposition: A | Discharge status: Home / Self Care | Payer: 59 | Source: Ambulatory Visit | Attending: Orthopedic Surgery | Admitting: Orthopedic Surgery

## 2023-08-29 DIAGNOSIS — M533 Sacrococcygeal disorders, not elsewhere classified: Secondary | ICD-10-CM | POA: Diagnosis not present

## 2023-08-29 MED ORDER — METHYLPREDNISOLONE ACETATE 40 MG/ML INJ SUSP (RADIOLOG
80.0000 mg | Freq: Once | INTRAMUSCULAR | Status: AC
  Administered 2023-08-29: 80 mg via INTRA_ARTICULAR

## 2023-08-31 ENCOUNTER — Other Ambulatory Visit: Payer: Self-pay

## 2023-09-03 ENCOUNTER — Other Ambulatory Visit: Payer: Self-pay

## 2023-09-04 ENCOUNTER — Other Ambulatory Visit: Payer: Self-pay

## 2023-09-06 ENCOUNTER — Other Ambulatory Visit: Payer: Self-pay

## 2023-09-10 ENCOUNTER — Other Ambulatory Visit: Payer: Self-pay

## 2023-09-11 ENCOUNTER — Other Ambulatory Visit: Payer: Self-pay

## 2023-09-11 DIAGNOSIS — M5416 Radiculopathy, lumbar region: Secondary | ICD-10-CM | POA: Diagnosis not present

## 2023-09-12 ENCOUNTER — Other Ambulatory Visit: Payer: Self-pay | Admitting: Orthopedic Surgery

## 2023-09-12 ENCOUNTER — Other Ambulatory Visit: Payer: Self-pay

## 2023-09-12 DIAGNOSIS — M5416 Radiculopathy, lumbar region: Secondary | ICD-10-CM

## 2023-09-14 ENCOUNTER — Other Ambulatory Visit: Payer: Self-pay

## 2023-09-14 MED ORDER — HYDROCODONE-ACETAMINOPHEN 5-325 MG PO TABS
1.0000 | ORAL_TABLET | Freq: Four times a day (QID) | ORAL | 0 refills | Status: DC | PRN
  Filled 2023-09-14 – 2023-09-27 (×3): qty 60, 15d supply, fill #0

## 2023-09-15 ENCOUNTER — Other Ambulatory Visit: Payer: Self-pay | Admitting: Internal Medicine

## 2023-09-16 ENCOUNTER — Other Ambulatory Visit: Payer: Self-pay | Admitting: Internal Medicine

## 2023-09-17 ENCOUNTER — Ambulatory Visit
Admission: RE | Admit: 2023-09-17 | Discharge: 2023-09-17 | Disposition: A | Discharge status: Home / Self Care | Payer: 59 | Source: Ambulatory Visit | Attending: Orthopedic Surgery | Admitting: Orthopedic Surgery

## 2023-09-17 ENCOUNTER — Other Ambulatory Visit (HOSPITAL_COMMUNITY): Payer: Self-pay

## 2023-09-17 DIAGNOSIS — M5416 Radiculopathy, lumbar region: Secondary | ICD-10-CM | POA: Diagnosis not present

## 2023-09-17 DIAGNOSIS — Z981 Arthrodesis status: Secondary | ICD-10-CM | POA: Diagnosis not present

## 2023-09-17 MED ORDER — WEGOVY 0.5 MG/0.5ML ~~LOC~~ SOAJ
0.5000 mg | SUBCUTANEOUS | 0 refills | Status: DC
  Filled 2023-09-17: qty 2, 28d supply, fill #0
  Filled 2023-10-14: qty 2, 28d supply, fill #1
  Filled 2023-11-02 – 2023-11-05 (×2): qty 2, 28d supply, fill #2

## 2023-09-17 NOTE — Telephone Encounter (Signed)
Requested Prescriptions  Pending Prescriptions Disp Refills   Semaglutide-Weight Management (WEGOVY) 0.5 MG/0.5ML SOAJ 6 mL 0    Sig: Inject 0.5 mg into the skin once a week.     Endocrinology:  Diabetes - GLP-1 Receptor Agonists - semaglutide Failed - 09/17/2023  5:32 PM      Failed - HBA1C in normal range and within 180 days    Hgb A1c MFr Bld  Date Value Ref Range Status  11/28/2022 5.5 <5.7 % of total Hgb Final    Comment:    For the purpose of screening for the presence of diabetes: . <5.7%       Consistent with the absence of diabetes 5.7-6.4%    Consistent with increased risk for diabetes             (prediabetes) > or =6.5%  Consistent with diabetes . This assay result is consistent with a decreased risk of diabetes. . Currently, no consensus exists regarding use of hemoglobin A1c for diagnosis of diabetes in children. . According to American Diabetes Association (ADA) guidelines, hemoglobin A1c <7.0% represents optimal control in non-pregnant diabetic patients. Different metrics may apply to specific patient populations.  Standards of Medical Care in Diabetes(ADA). .          Failed - Cr in normal range and within 360 days    Creat  Date Value Ref Range Status  05/15/2023 0.69 (L) 0.70 - 1.35 mg/dL Final         Passed - Valid encounter within last 6 months    Recent Outpatient Visits           2 months ago Chronic midline low back pain with right-sided sciatica   [place] [facility] [place], Salvadore Oxford, NP   4 months ago Mixed hyperlipidemia   [place] [facility] [place], Salvadore Oxford, NP   9 months ago Mixed hyperlipidemia   [place] [facility] [place], Salvadore Oxford, NP   1 year ago Welcome to [employer] preventive visit   [place] [facility] [place], Salvadore Oxford, NP   1 year ago Acute nasopharyngitis   [place] [facility] [place], Salvadore Oxford, NP       Future Appointments              In 1 month [place], Salvadore Oxford, NP [place] [facility], [facility]

## 2023-09-17 NOTE — Telephone Encounter (Signed)
Sending since local pharmacy requested and previous rxs have were there as well.   Requested Prescriptions  Pending Prescriptions Disp Refills   albuterol (VENTOLIN HFA) 108 (90 Base) MCG/ACT inhaler [Pharmacy Med Name: ALBUTEROL HFA (PROAIR) INHALER] 8.5 each 2    Sig: TAKE 2 PUFFS BY MOUTH EVERY 6 HOURS AS NEEDED FOR WHEEZE OR SHORTNESS OF BREATH     Pulmonology:  Beta Agonists 2 Failed - 09/17/2023  1:01 PM      Failed - Last BP in normal range    BP Readings from Last 1 Encounters:  07/12/23 (!) 159/83         Failed - Last Heart Rate in normal range    Pulse Readings from Last 1 Encounters:  07/12/23 (!) 155         Passed - Valid encounter within last 12 months    Recent Outpatient Visits           2 months ago Chronic midline low back pain with right-sided sciatica   [place] [facility] [place], Salvadore Oxford, NP   4 months ago Mixed hyperlipidemia   [place] [facility] [place], Salvadore Oxford, NP   9 months ago Mixed hyperlipidemia   [place] [facility] [place], Salvadore Oxford, NP   1 year ago [facility] to [employer] preventive visit   [place] [facility] [place], Salvadore Oxford, NP   1 year ago Acute nasopharyngitis   [place] [facility] [place], Salvadore Oxford, NP       Future Appointments             In 1 month [place], Salvadore Oxford, NP [place] [facility], [facility]

## 2023-09-18 ENCOUNTER — Other Ambulatory Visit (HOSPITAL_COMMUNITY): Payer: Self-pay

## 2023-09-19 ENCOUNTER — Other Ambulatory Visit: Payer: 59

## 2023-09-19 ENCOUNTER — Ambulatory Visit
Admission: RE | Admit: 2023-09-19 | Discharge: 2023-09-19 | Discharge status: Home / Self Care | Payer: 59 | Source: Ambulatory Visit | Attending: Orthopedic Surgery | Admitting: Orthopedic Surgery

## 2023-09-19 DIAGNOSIS — Z981 Arthrodesis status: Secondary | ICD-10-CM | POA: Diagnosis not present

## 2023-09-19 DIAGNOSIS — M5416 Radiculopathy, lumbar region: Secondary | ICD-10-CM

## 2023-09-27 ENCOUNTER — Other Ambulatory Visit: Payer: Self-pay | Admitting: Internal Medicine

## 2023-09-27 ENCOUNTER — Other Ambulatory Visit: Payer: Self-pay

## 2023-09-27 DIAGNOSIS — Z0000 Encounter for general adult medical examination without abnormal findings: Secondary | ICD-10-CM

## 2023-09-27 NOTE — Telephone Encounter (Signed)
 Requested Prescriptions  Pending Prescriptions Disp Refills   montelukast (SINGULAIR) 10 MG tablet [Pharmacy Med Name: MONTELUKAST SOD 10 MG TABLET] 90 tablet 0    Sig: TAKE 1 TABLET BY MOUTH EVERYDAY AT BEDTIME     Pulmonology:  Leukotriene Inhibitors Passed - 09/27/2023  4:04 PM      Passed - Valid encounter within last 12 months    Recent Outpatient Visits           3 months ago Chronic midline low back pain with right-sided sciatica   [place] [facility] [place], Salvadore Oxford, NP   4 months ago Mixed hyperlipidemia   [place] [facility] [place], Salvadore Oxford, NP   10 months ago Mixed hyperlipidemia   [place] [facility] [place], Salvadore Oxford, NP   1 year ago [employer] to [employer] preventive visit   [place] [facility] [place], Salvadore Oxford, NP   1 year ago Acute nasopharyngitis   [place] [facility] [place], Salvadore Oxford, NP       Future Appointments             In 1 month [place], Salvadore Oxford, NP [place] [facility], [facility]

## 2023-09-28 DIAGNOSIS — M545 Low back pain, unspecified: Secondary | ICD-10-CM | POA: Diagnosis not present

## 2023-10-01 ENCOUNTER — Other Ambulatory Visit: Payer: Self-pay

## 2023-10-02 ENCOUNTER — Other Ambulatory Visit: Payer: Self-pay

## 2023-10-05 ENCOUNTER — Other Ambulatory Visit: Payer: Self-pay

## 2023-10-07 ENCOUNTER — Other Ambulatory Visit: Payer: Self-pay | Admitting: Internal Medicine

## 2023-10-08 ENCOUNTER — Other Ambulatory Visit: Payer: Self-pay

## 2023-10-09 ENCOUNTER — Other Ambulatory Visit: Payer: Self-pay

## 2023-10-09 MED ORDER — HYDROCODONE-ACETAMINOPHEN 5-325 MG PO TABS
1.0000 | ORAL_TABLET | Freq: Four times a day (QID) | ORAL | 0 refills | Status: DC | PRN
Start: 2023-10-09 — End: 2023-11-06
  Filled 2023-10-09 – 2023-10-10 (×2): qty 60, 15d supply, fill #0

## 2023-10-09 NOTE — Telephone Encounter (Signed)
 Rx albuterol   09/17/23 - 8.5 g  2RF- too soon Requested Prescriptions  Pending Prescriptions Disp Refills   albuterol (VENTOLIN HFA) 108 (90 Base) MCG/ACT inhaler [Pharmacy Med Name: ALBUTEROL HFA 90MCG/ACT (V)] 54 g 3    Sig: USE 2 INHALATIONS BY MOUTH EVERY 6 HOURS AS NEEDED FOR WHEEZING  OR SHORTNESS OF BREATH     Pulmonology:  Beta Agonists 2 Failed - 10/09/2023 10:09 AM      Failed - Last BP in normal range    BP Readings from Last 1 Encounters:  07/12/23 (!) 159/83         Failed - Last Heart Rate in normal range    Pulse Readings from Last 1 Encounters:  07/12/23 (!) 155         Passed - Valid encounter within last 12 months    Recent Outpatient Visits           3 months ago Chronic midline low back pain with right-sided sciatica   [place] [facility] [place], Salvadore Oxford, NP   4 months ago Mixed hyperlipidemia   [place] [facility] [place], Salvadore Oxford, NP   10 months ago Mixed hyperlipidemia   [place] [facility] [place], Salvadore Oxford, NP   1 year ago [facility] to [employer] preventive visit   [place] [facility] [place], Salvadore Oxford, NP   1 year ago Acute nasopharyngitis   [facility] [place], Salvadore Oxford, NP       Future Appointments             In 4 weeks [place], Salvadore Oxford, NP  [facility], [facility]

## 2023-10-10 ENCOUNTER — Other Ambulatory Visit: Payer: Self-pay

## 2023-10-10 DIAGNOSIS — G894 Chronic pain syndrome: Secondary | ICD-10-CM | POA: Diagnosis not present

## 2023-10-13 LAB — TOXASSURE SELECT 13 (MW), URINE

## 2023-10-14 MED FILL — Carvedilol Tab 6.25 MG: ORAL | 90 days supply | Qty: 180 | Fill #0 | Status: CN

## 2023-10-15 ENCOUNTER — Other Ambulatory Visit: Payer: Self-pay | Admitting: Anesthesiology

## 2023-10-15 ENCOUNTER — Encounter: Payer: Self-pay | Admitting: Anesthesiology

## 2023-10-15 ENCOUNTER — Other Ambulatory Visit (HOSPITAL_COMMUNITY): Payer: Self-pay

## 2023-10-15 ENCOUNTER — Ambulatory Visit (HOSPITAL_BASED_OUTPATIENT_CLINIC_OR_DEPARTMENT_OTHER): Admitting: Anesthesiology

## 2023-10-15 ENCOUNTER — Ambulatory Visit
Admission: RE | Admit: 2023-10-15 | Discharge: 2023-10-15 | Disposition: A | Discharge status: Home / Self Care | Source: Ambulatory Visit | Attending: Anesthesiology | Admitting: Anesthesiology

## 2023-10-15 VITALS — BP 146/97 | HR 78 | Temp 98.0°F | Resp 22 | Ht 74.0 in | Wt 252.0 lb

## 2023-10-15 DIAGNOSIS — M47816 Spondylosis without myelopathy or radiculopathy, lumbar region: Secondary | ICD-10-CM | POA: Insufficient documentation

## 2023-10-15 DIAGNOSIS — M5431 Sciatica, right side: Secondary | ICD-10-CM | POA: Insufficient documentation

## 2023-10-15 DIAGNOSIS — M961 Postlaminectomy syndrome, not elsewhere classified: Secondary | ICD-10-CM | POA: Diagnosis not present

## 2023-10-15 DIAGNOSIS — F1190 Opioid use, unspecified, uncomplicated: Secondary | ICD-10-CM | POA: Insufficient documentation

## 2023-10-15 DIAGNOSIS — G894 Chronic pain syndrome: Secondary | ICD-10-CM | POA: Diagnosis not present

## 2023-10-15 MED ORDER — SODIUM CHLORIDE 0.9% FLUSH
10.0000 mL | Freq: Once | INTRAVENOUS | Status: AC
  Administered 2023-10-15: 10 mL

## 2023-10-15 MED ORDER — OXYCODONE HCL 5 MG PO TABS: 5.0000 mg | ORAL_TABLET | Freq: Four times a day (QID) | ORAL | 0 refills | Status: DC | PRN

## 2023-10-15 MED ORDER — ROPIVACAINE HCL 2 MG/ML IJ SOLN
INTRAMUSCULAR | Status: AC
  Filled 2023-10-15: qty 20

## 2023-10-15 MED ORDER — IOHEXOL 180 MG/ML  SOLN
10.0000 mL | Freq: Once | INTRAMUSCULAR | Status: AC | PRN
  Administered 2023-10-15: 10 mL via EPIDURAL

## 2023-10-15 MED ORDER — LIDOCAINE HCL (PF) 1 % IJ SOLN
5.0000 mL | Freq: Once | INTRAMUSCULAR | Status: AC
  Administered 2023-10-15: 5 mL via SUBCUTANEOUS

## 2023-10-15 MED ORDER — LIDOCAINE HCL (PF) 1 % IJ SOLN
INTRAMUSCULAR | Status: AC
  Filled 2023-10-15: qty 5

## 2023-10-15 MED ORDER — LACTATED RINGERS IV SOLN: INTRAVENOUS | Status: DC

## 2023-10-15 MED ORDER — SODIUM CHLORIDE (PF) 0.9 % IJ SOLN
INTRAMUSCULAR | Status: AC
  Filled 2023-10-15: qty 10

## 2023-10-15 MED ORDER — MIDAZOLAM HCL 2 MG/2ML IJ SOLN
INTRAMUSCULAR | Status: AC
  Filled 2023-10-15: qty 2

## 2023-10-15 MED ORDER — TRIAMCINOLONE ACETONIDE 40 MG/ML IJ SUSP
INTRAMUSCULAR | Status: AC
Start: 2023-10-15 — End: ?
  Filled 2023-10-15: qty 1

## 2023-10-15 MED ORDER — MIDAZOLAM HCL 2 MG/2ML IJ SOLN
2.0000 mg | Freq: Once | INTRAMUSCULAR | Status: AC
  Administered 2023-10-15: 1.5 mg via INTRAVENOUS

## 2023-10-15 MED ORDER — TRIAMCINOLONE ACETONIDE 40 MG/ML IJ SUSP
40.0000 mg | Freq: Once | INTRAMUSCULAR | Status: AC
  Administered 2023-10-15: 40 mg

## 2023-10-15 MED ORDER — ROPIVACAINE HCL 2 MG/ML IJ SOLN
10.0000 mL | Freq: Once | INTRAMUSCULAR | Status: AC
  Administered 2023-10-15: 1 mL via EPIDURAL

## 2023-10-15 MED ORDER — IOHEXOL 180 MG/ML  SOLN
INTRAMUSCULAR | Status: AC
  Filled 2023-10-15: qty 20

## 2023-10-15 NOTE — Progress Notes (Unsigned)
 Safety precautions to be maintained throughout the outpatient stay will include: orient to surroundings, keep bed in low position, maintain call bell within reach at all times, provide assistance with transfer out of bed and ambulation.

## 2023-10-15 NOTE — Patient Instructions (Signed)

## 2023-10-16 ENCOUNTER — Other Ambulatory Visit: Payer: Self-pay

## 2023-10-16 ENCOUNTER — Other Ambulatory Visit (HOSPITAL_COMMUNITY): Payer: Self-pay

## 2023-10-16 ENCOUNTER — Telehealth: Payer: Self-pay

## 2023-10-16 MED FILL — Carvedilol Tab 6.25 MG: ORAL | 90 days supply | Qty: 180 | Fill #0 | Status: AC

## 2023-10-16 NOTE — Progress Notes (Signed)
 Subjective:  Patient ID: Bryan Catalina., male    DOB: 1956/03/15  Age: 68 y.o. MRN: [id number]  CC: Back Pain (R>L)   Procedure: L5-S1 epidural steroid under fluoroscopic guidance with minimal sedation  HPI Bryan Ibarra. presents for reevaluation.  He continues to have complaints of lower extremity sciatica symptoms worse on the right side than the left side going down to the ankle and right lateral leg.  He continues to have low back pain complaint.  No change in lower extremity strength function bowel or bladder function has been noted since her last visit.  Outpatient Medications Prior to Visit  Medication Sig Dispense Refill   albuterol (VENTOLIN HFA) 108 (90 Base) MCG/ACT inhaler TAKE 2 PUFFS BY MOUTH EVERY 6 HOURS AS NEEDED FOR WHEEZE OR SHORTNESS OF BREATH 8.5 each 2   amLODipine (NORVASC) 5 MG tablet Take 1 tablet (5 mg total) by mouth daily. 90 tablet 1   aspirin EC 81 MG tablet Take 1 tablet (81 mg total) by mouth daily. Swallow whole.     Budeson-Glycopyrrol-Formoterol (BREZTRI AEROSPHERE) 160-9-4.8 MCG/ACT AERO Inhale 2 puffs into the lungs 2 (two) times daily. 10.7 g 3   carvedilol (COREG) 6.25 MG tablet TAKE 1 TABLET(6.25 MG) BY MOUTH TWICE DAILY WITH A MEAL (Patient taking differently: 6.25 mg 2 (two) times daily with a meal.) 180 tablet 1   cyclobenzaprine (FLEXERIL) 10 MG tablet Take 1 tablet (10 mg total) by mouth every 8 (eight) hours. 60 tablet 2   cyclobenzaprine (FLEXERIL) 5 MG tablet Take 1-2 tablets (5-10 mg total) by mouth 3 (three) times daily as needed. 60 tablet 0   HYDROcodone-acetaminophen (NORCO/VICODIN) 5-325 MG tablet Take 1 tablet by mouth every 6 (six) to 8 (eight) hours as needed for pain. 60 tablet 0   lisinopril (ZESTRIL) 40 MG tablet Take 1 tablet (40 mg total) by mouth daily. 90 tablet 3   Melatonin 10 MG TABS Take 10 mg by mouth at bedtime.     montelukast (SINGULAIR) 10 MG tablet TAKE 1 TABLET BY MOUTH EVERYDAY AT BEDTIME 90 tablet 0    omeprazole (PRILOSEC) 40 MG capsule TAKE 1 CAPSULE (40 MG TOTAL) BY MOUTH DAILY. 90 capsule 0   pregabalin (LYRICA) 300 MG capsule Take 1 capsule (300 mg total) by mouth 2 (two) times daily. 60 capsule 0   rosuvastatin (CRESTOR) 10 MG tablet TAKE 1 TABLET BY MOUTH EVERY DAY 90 tablet 3   Semaglutide-Weight Management (WEGOVY) 0.5 MG/0.5ML SOAJ Inject 0.5 mg into the skin once a week. 6 mL 0   vardenafil (LEVITRA) 10 MG tablet Take 1 tablet (10 mg total) by mouth daily as needed for erectile dysfunction. 10 tablet 5   HYDROcodone-acetaminophen (NORCO/VICODIN) 5-325 MG tablet Take 1 tablet by mouth every 6 (six) to 8 (eight) hours as needed for pain (Patient not taking: Reported on 10/15/2023) 60 tablet 0   HYDROcodone-acetaminophen (NORCO/VICODIN) 5-325 MG tablet Take 1-2 tablets by mouth every 8 (eight) hours as needed for pain. (Patient not taking: Reported on 10/15/2023) 60 tablet 0   HYDROcodone-acetaminophen (NORCO/VICODIN) 5-325 MG tablet Take 1 tablet by mouth every 6 (six) to 8 (eight) hours as needed. (Patient not taking: Reported on 10/15/2023) 60 tablet 0   cyclobenzaprine (FLEXERIL) 10 MG tablet Take 5-10 mg by mouth as needed. (Patient not taking: Reported on 10/15/2023)     methylPREDNISolone (MEDROL) 4 MG TBPK tablet Take as prescribed on dose pack over 6 days (Patient not taking: Reported on 10/15/2023)  21 tablet 0   pregabalin (LYRICA) 150 MG capsule Take 1 capsule (150 mg total) by mouth 2 (two) times daily for leg/nerve pain (Patient not taking: Reported on 10/15/2023) 60 capsule 3   No facility-administered medications prior to visit.    Review of Systems CNS: No confusion or sedation Cardiac: No angina or palpitations GI: No abdominal pain or constipation Constitutional: No nausea vomiting fevers or chills  Objective:  BP (!) 146/97   Pulse 78   Temp 98 F (36.7 C) (Temporal)   Resp (!) 22   Ht 6\' 2"  (1.88 m)   Wt 252 lb (114.3 kg)   SpO2 97%   BMI 32.35 kg/m    BP  Readings from Last 3 Encounters:  10/15/23 (!) 146/97  07/12/23 (!) 159/83  06/22/23 138/78     Wt Readings from Last 3 Encounters:  10/15/23 252 lb (114.3 kg)  07/12/23 260 lb (117.9 kg)  06/22/23 266 lb 12.8 oz (121 kg)     Physical Exam Pt is alert and oriented PERRL EOMI HEART IS RRR no murmur or rub LCTA no wheezing or rales MUSCULOSKELETAL reveals some paraspinous muscle tenderness but no overt trigger points.  He walks with an antalgic gait.  The scar on his back is well-healed with no other abnormal cutaneous findings.  Muscle tone and bulk is at baseline per last examination.  Labs  Lab Results  Component Value Date   HGBA1C 5.5 11/28/2022   HGBA1C 5.5 05/16/2022   HGBA1C 5.3 01/01/2017   Lab Results  Component Value Date   LDLCALC 42 11/28/2022   CREATININE 0.69 (L) 05/15/2023    -------------------------------------------------------------------------------------------------------------------- Lab Results  Component Value Date   WBC 7.9 05/15/2023   HGB 14.7 05/15/2023   HCT 45.0 05/15/2023   PLT 240 05/15/2023   GLUCOSE 108 05/15/2023   CHOL 135 11/28/2022   TRIG 181 (H) 11/28/2022   HDL 67 11/28/2022   LDLDIRECT <4 06/24/2018   LDLCALC 42 11/28/2022   ALT 17 05/15/2023   AST 13 05/15/2023   NA 140 05/15/2023   K 4.1 05/15/2023   CL 104 05/15/2023   CREATININE 0.69 (L) 05/15/2023   BUN 10 05/15/2023   CO2 27 05/15/2023   TSH 0.89 01/30/2020   PSA 2.25 05/16/2022   INR 0.9 08/02/2019   HGBA1C 5.5 11/28/2022    --------------------------------------------------------------------------------------------------------------------- DG PAIN CLINIC C-ARM 1-60 MIN NO REPORT Result Date: 10/15/2023 Fluoro was used, but no Radiologist interpretation will be provided. Please refer to "NOTES" tab for provider progress note.    Assessment & Plan:   Bryan Ibarra" was seen today for back pain.  Diagnoses and all orders for this visit:  Chronic  pain syndrome  Chronic, continuous use of opioids  Post laminectomy syndrome  Sciatica of right side  Facet arthritis of lumbar region  Other orders -     oxyCODONE (ROXICODONE) 5 MG immediate release tablet; Take 1 tablet (5 mg total) by mouth every 6 (six) hours as needed for severe pain (pain score 7-10). -     triamcinolone acetonide (KENALOG-40) injection 40 mg -     sodium chloride flush (NS) 0.9 % injection 10 mL -     ropivacaine (PF) 2 mg/mL (0.2%) (NAROPIN) injection 10 mL -     midazolam (VERSED) injection 2 mg -     lidocaine (PF) (XYLOCAINE) 1 % injection 5 mL -     lactated ringers infusion -     iohexol (OMNIPAQUE) 180 MG/ML injection  10 mL        ----------------------------------------------------------------------------------------------------------------------  Problem List Items Addressed This Visit       Unprioritized   Chronic pain syndrome - Primary   Relevant Medications   oxyCODONE (ROXICODONE) 5 MG immediate release tablet   Chronic, continuous use of opioids   Facet arthritis of lumbar region   Relevant Medications   oxyCODONE (ROXICODONE) 5 MG immediate release tablet   Post laminectomy syndrome   Other Visit Diagnoses       Sciatica of right side       Relevant Medications   midazolam (VERSED) injection 2 mg (Completed)         ----------------------------------------------------------------------------------------------------------------------  1. Chronic pain syndrome (Primary) I have reviewed the [facility] practitioner database information is appropriate to maintain opioid therapy and initiate this per our clinic.  We have gone over the opioid clinic policy and consent.  Will start him on oxycodone 5 mg tablets 4 times a day.  We have gone over the risks and benefits of chronic opioid therapy.  He has been on this in the past and responded favorably he reports.  He maintains the medications enable him to stay more active and  more functional despite ongoing chronic pain in the low back and lower legs.  2. Chronic, continuous use of opioids As above  3. Post laminectomy syndrome As above and continue core stretching strengthening exercises as reviewed  4. Sciatica of right side Will proceed with a lumbar epidural steroid injection today to see if we can help with the sciatica.  We gone over the risk benefits of the procedure with him in full detail.  Will schedule him for return to clinic in 1 month with possible repeat lumbar epidural steroid injection versus caudal epidural approach.  5. Facet arthritis of lumbar region As above    ----------------------------------------------------------------------------------------------------------------------  I have discontinued Bryan Catalina. "Bryan Ibarra"'s methylPREDNISolone. I am also having him start on oxyCODONE. Additionally, I am having him maintain his Melatonin, carvedilol, Breztri Aerosphere, aspirin EC, vardenafil, cyclobenzaprine, HYDROcodone-acetaminophen, rosuvastatin, lisinopril, cyclobenzaprine, pregabalin, omeprazole, HYDROcodone-acetaminophen, HYDROcodone-acetaminophen, amLODipine, albuterol, Wegovy, montelukast, and HYDROcodone-acetaminophen. We administered triamcinolone acetonide, sodium chloride flush, ropivacaine (PF) 2 mg/mL (0.2%), midazolam, lidocaine (PF), and iohexol.   Meds ordered this encounter  Medications   oxyCODONE (ROXICODONE) 5 MG immediate release tablet    Sig: Take 1 tablet (5 mg total) by mouth every 6 (six) hours as needed for severe pain (pain score 7-10).    Dispense:  120 tablet    Refill:  0   triamcinolone acetonide (KENALOG-40) injection 40 mg   sodium chloride flush (NS) 0.9 % injection 10 mL   ropivacaine (PF) 2 mg/mL (0.2%) (NAROPIN) injection 10 mL   midazolam (VERSED) injection 2 mg   lidocaine (PF) (XYLOCAINE) 1 % injection 5 mL   lactated ringers infusion   iohexol (OMNIPAQUE) 180 MG/ML injection 10 mL    Patient's Medications  New Prescriptions   OXYCODONE (ROXICODONE) 5 MG IMMEDIATE RELEASE TABLET    Take 1 tablet (5 mg total) by mouth every 6 (six) hours as needed for severe pain (pain score 7-10).  Previous Medications   ALBUTEROL (VENTOLIN HFA) 108 (90 BASE) MCG/ACT INHALER    TAKE 2 PUFFS BY MOUTH EVERY 6 HOURS AS NEEDED FOR WHEEZE OR SHORTNESS OF BREATH   AMLODIPINE (NORVASC) 5 MG TABLET    Take 1 tablet (5 mg total) by mouth daily.   ASPIRIN EC 81 MG TABLET    Take 1  tablet (81 mg total) by mouth daily. Swallow whole.   BUDESON-GLYCOPYRROL-FORMOTEROL (BREZTRI AEROSPHERE) 160-9-4.8 MCG/ACT AERO    Inhale 2 puffs into the lungs 2 (two) times daily.   CARVEDILOL (COREG) 6.25 MG TABLET    TAKE 1 TABLET(6.25 MG) BY MOUTH TWICE DAILY WITH A MEAL   CYCLOBENZAPRINE (FLEXERIL) 10 MG TABLET    Take 1 tablet (10 mg total) by mouth every 8 (eight) hours.   CYCLOBENZAPRINE (FLEXERIL) 5 MG TABLET    Take 1-2 tablets (5-10 mg total) by mouth 3 (three) times daily as needed.   HYDROCODONE-ACETAMINOPHEN (NORCO/VICODIN) 5-325 MG TABLET    Take 1 tablet by mouth every 6 (six) to 8 (eight) hours as needed for pain   HYDROCODONE-ACETAMINOPHEN (NORCO/VICODIN) 5-325 MG TABLET    Take 1-2 tablets by mouth every 8 (eight) hours as needed for pain.   HYDROCODONE-ACETAMINOPHEN (NORCO/VICODIN) 5-325 MG TABLET    Take 1 tablet by mouth every 6 (six) to 8 (eight) hours as needed.   HYDROCODONE-ACETAMINOPHEN (NORCO/VICODIN) 5-325 MG TABLET    Take 1 tablet by mouth every 6 (six) to 8 (eight) hours as needed for pain.   LISINOPRIL (ZESTRIL) 40 MG TABLET    Take 1 tablet (40 mg total) by mouth daily.   MELATONIN 10 MG TABS    Take 10 mg by mouth at bedtime.   MONTELUKAST (SINGULAIR) 10 MG TABLET    TAKE 1 TABLET BY MOUTH EVERYDAY AT BEDTIME   OMEPRAZOLE (PRILOSEC) 40 MG CAPSULE    TAKE 1 CAPSULE (40 MG TOTAL) BY MOUTH DAILY.   PREGABALIN (LYRICA) 300 MG CAPSULE    Take 1 capsule (300 mg total) by mouth 2 (two) times  daily.   ROSUVASTATIN (CRESTOR) 10 MG TABLET    TAKE 1 TABLET BY MOUTH EVERY DAY   SEMAGLUTIDE-WEIGHT MANAGEMENT (WEGOVY) 0.5 MG/0.5ML SOAJ    Inject 0.5 mg into the skin once a week.   VARDENAFIL (LEVITRA) 10 MG TABLET    Take 1 tablet (10 mg total) by mouth daily as needed for erectile dysfunction.  Modified Medications   No medications on file  Discontinued Medications   CYCLOBENZAPRINE (FLEXERIL) 10 MG TABLET    Take 5-10 mg by mouth as needed.   METHYLPREDNISOLONE (MEDROL) 4 MG TBPK TABLET    Take as prescribed on dose pack over 6 days   PREGABALIN (LYRICA) 150 MG CAPSULE    Take 1 capsule (150 mg total) by mouth 2 (two) times daily for leg/nerve pain   ----------------------------------------------------------------------------------------------------------------------  Follow-up: Return in about 1 month (around 11/15/2023) for MM, evaluation, procedure.   Procedure: L5-S1 LESI with fluoroscopic guidance and minimal sedation  NOTE: The risks, benefits, and expectations of the procedure have been discussed and explained to the patient who was understanding and in agreement with suggested treatment plan. No guarantees were made.  DESCRIPTION OF PROCEDURE: Lumbar epidural steroid injection with 1.5 mg IV Versed, EKG, blood pressure, pulse, and pulse oximetry monitoring. The procedure was performed with the patient in the prone position under fluoroscopic guidance.  Sterile prep x3 was initiated and I then injected subcutaneous lidocaine to the overlying L5-S1 site after its fluoroscopic identifictation.  Using strict aseptic technique, I then advanced an 18-gauge Tuohy epidural needle in the midline using interlaminar approach via loss-of-resistance to saline technique. There was negative aspiration for heme or  CSF.  I then confirmed position with both AP and Lateral fluoroscan.  2 cc of contrast dye were injected and a  total of 5 mL  of Preservative-Free normal saline mixed with 40 mg of  Kenalog and 1cc Ropicaine 0.2 percent were injected incrementally via the  epidurally placed needle. The needle was removed. The patient tolerated the injection well and was convalesced and discharged to home in stable condition. Should the patient have any post procedure difficulty they have been instructed on how to contact us for assistance.   Yevette Edwards, MD

## 2023-10-16 NOTE — Telephone Encounter (Signed)
 No issues post-procedure.

## 2023-10-17 ENCOUNTER — Other Ambulatory Visit (HOSPITAL_COMMUNITY): Payer: Self-pay

## 2023-10-28 ENCOUNTER — Other Ambulatory Visit: Payer: Self-pay | Admitting: Internal Medicine

## 2023-10-28 DIAGNOSIS — Z0000 Encounter for general adult medical examination without abnormal findings: Secondary | ICD-10-CM

## 2023-10-30 ENCOUNTER — Other Ambulatory Visit (HOSPITAL_COMMUNITY): Payer: Self-pay

## 2023-10-30 MED ORDER — CARVEDILOL 6.25 MG PO TABS
6.2500 mg | ORAL_TABLET | Freq: Two times a day (BID) | ORAL | 0 refills | Status: DC
  Filled 2023-10-30: qty 180, fill #0
  Filled 2023-11-02 – 2023-12-25 (×10): qty 180, 90d supply, fill #0

## 2023-10-30 NOTE — Telephone Encounter (Signed)
 Requested Prescriptions  Pending Prescriptions Disp Refills   carvedilol (COREG) 6.25 MG tablet 180 tablet 0    Sig: TAKE 1 TABLET(6.25 MG) BY MOUTH TWICE DAILY WITH A MEAL     Cardiovascular: Beta Blockers 3 Failed - 10/30/2023  3:18 PM      Failed - Cr in normal range and within 360 days    Creat  Date Value Ref Range Status  05/15/2023 0.69 (L) 0.70 - 1.35 mg/dL Final         Failed - Last BP in normal range    BP Readings from Last 1 Encounters:  10/15/23 (!) 146/97         Failed - Valid encounter within last 6 months    Recent Outpatient Visits   None     Future Appointments             In 1 week Bryan Munroe, NP [facility]            Passed - AST in normal range and within 360 days    AST  Date Value Ref Range Status  05/15/2023 13 10 - 35 U/L Final         Passed - ALT in normal range and within 360 days    ALT  Date Value Ref Range Status  05/15/2023 17 9 - 46 U/L Final         Passed - Last Heart Rate in normal range    Pulse Readings from Last 1 Encounters:  10/15/23 78

## 2023-10-30 NOTE — Telephone Encounter (Signed)
 Requested medication (s) are due for refill today -yes  Requested medication (s) are on the active medication list -yes  Future visit scheduled -yes  Last refill: 04/05/23 10.7g 3RF  Notes to clinic: off protocol- provider review  Pharmacy comment: REQUEST FOR 90 DAYS PRESCRIPTION. DX Code Needed.    Requested Prescriptions  Pending Prescriptions Disp Refills   BREZTRI AEROSPHERE 160-9-4.8 MCG/ACT AERO [Pharmacy Med Name: BREZTRI AEROSPHERE INHALER] 32.1 each 2    Sig: INHALE 2 PUFFS INTO THE LUNGS TWICE A DAY     Off-Protocol Failed - 10/30/2023 12:45 PM      Failed - Medication not assigned to a protocol, review manually.      Failed - Valid encounter within last 12 months    Recent Outpatient Visits   None     Future Appointments             In 1 week Baity, Salvadore Oxford, NP [place] [facility], [facility]               Requested Prescriptions  Pending Prescriptions Disp Refills   BREZTRI AEROSPHERE 160-9-4.8 MCG/ACT AERO [Pharmacy Med Name: BREZTRI AEROSPHERE INHALER] 32.1 each 2    Sig: INHALE 2 PUFFS INTO THE LUNGS TWICE A DAY     Off-Protocol Failed - 10/30/2023 12:45 PM      Failed - Medication not assigned to a protocol, review manually.      Failed - Valid encounter within last 12 months    Recent Outpatient Visits   None     Future Appointments             In 1 week Baity, Salvadore Oxford, NP [place] [facility], [facility]

## 2023-11-02 ENCOUNTER — Other Ambulatory Visit: Payer: Self-pay

## 2023-11-02 ENCOUNTER — Other Ambulatory Visit (HOSPITAL_COMMUNITY): Payer: Self-pay

## 2023-11-02 ENCOUNTER — Other Ambulatory Visit: Payer: Self-pay | Admitting: Internal Medicine

## 2023-11-02 DIAGNOSIS — Z0000 Encounter for general adult medical examination without abnormal findings: Secondary | ICD-10-CM

## 2023-11-02 MED ORDER — OMEPRAZOLE 40 MG PO CPDR
40.0000 mg | DELAYED_RELEASE_CAPSULE | Freq: Every day | ORAL | 0 refills | Status: DC
  Filled 2023-11-02 – 2023-11-12 (×2): qty 90, 90d supply, fill #0

## 2023-11-02 MED ORDER — MONTELUKAST SODIUM 10 MG PO TABS
10.0000 mg | ORAL_TABLET | Freq: Every day | ORAL | 0 refills | Status: DC
  Filled 2023-11-02 – 2023-11-07 (×4): qty 90, 90d supply, fill #0

## 2023-11-05 ENCOUNTER — Other Ambulatory Visit: Payer: Self-pay

## 2023-11-05 ENCOUNTER — Other Ambulatory Visit (HOSPITAL_COMMUNITY): Payer: Self-pay

## 2023-11-06 ENCOUNTER — Ambulatory Visit (INDEPENDENT_AMBULATORY_CARE_PROVIDER_SITE_OTHER): Payer: Self-pay | Admitting: Internal Medicine

## 2023-11-06 ENCOUNTER — Encounter: Payer: Self-pay | Admitting: Internal Medicine

## 2023-11-06 VITALS — BP 138/78 | Ht 74.0 in | Wt 248.4 lb

## 2023-11-06 DIAGNOSIS — R739 Hyperglycemia, unspecified: Secondary | ICD-10-CM

## 2023-11-06 DIAGNOSIS — Z125 Encounter for screening for malignant neoplasm of prostate: Secondary | ICD-10-CM | POA: Diagnosis not present

## 2023-11-06 DIAGNOSIS — Z0001 Encounter for general adult medical examination with abnormal findings: Secondary | ICD-10-CM | POA: Diagnosis not present

## 2023-11-06 DIAGNOSIS — E6609 Other obesity due to excess calories: Secondary | ICD-10-CM

## 2023-11-06 DIAGNOSIS — E782 Mixed hyperlipidemia: Secondary | ICD-10-CM | POA: Diagnosis not present

## 2023-11-06 DIAGNOSIS — E66811 Obesity, class 1: Secondary | ICD-10-CM

## 2023-11-06 DIAGNOSIS — Z6831 Body mass index (BMI) 31.0-31.9, adult: Secondary | ICD-10-CM

## 2023-11-06 MED ORDER — ALBUTEROL SULFATE HFA 108 (90 BASE) MCG/ACT IN AERS
1.0000 | INHALATION_SPRAY | RESPIRATORY_TRACT | 2 refills | Status: DC | PRN
Start: 2023-11-06 — End: 2023-12-21

## 2023-11-06 NOTE — Progress Notes (Addendum)
 Subjective:    Patient ID: Bryan Ibarra., male    DOB: Mar 30, 1956, 68 y.o.   MRN: [id number]  HPI  Patient presents to clinic today for his annual exam.  Flu: Never Tetanus: >10 years ago Pneumovax: Never Prevnar: Never Shingrix: Never PSA: 04/2022 Colon screening: Never Vision screening: annually Dentist: biannually  Diet:  He does eat meat. He consumes fruits and veggies. He does eat some fried foods. He drinks mostly water, sweet tea. Exercise: None  Review of Systems     Past Medical History:  Diagnosis Date   Alcohol abuse    a. up to a 12 pack of beer/day. (hx of, not currently) 04/10/23   Arthritis    CAD in native artery    a. LHC 6/18: severe 1 vessel CAD with 90% thrombotic stenosis in the proximal LAD which was felt to be the culprit for the patient's non-STEMI. He underwent successful PCI/DES with a resolute onyx drug-eluting stent (3.0 x 18 mm). There was 0% residual stenosis. LVEF estimated at 55-65% by visual estimate. LVEDP was mildly elevated   Centrilobular emphysema (HCC)    Dyspnea    ED (erectile dysfunction)    GERD (gastroesophageal reflux disease)    H/O echocardiogram    a. echo 6/18: EF 60-65%, normal wall motion, grade 1 diastolic dysfunction, RV cavity size was normal with normal wall thickness and normal RV systolic function. There were no significant valvular abnormalities   Hearing loss    HLD (hyperlipidemia)    Hypertension    Marijuana abuse    hx of. Not currently (04/10/23)   NSTEMI (non-ST elevated myocardial infarction) (HCC)    a. LHC 6/18: severe 1 vessel CAD with 90% thrombotic stenosis in the proximal LAD which was felt to be the culprit for the patient's non-STEMI. He underwent successful PCI/DES with a resolute onyx drug-eluting stent (3.0 x 18 mm). There was 0% residual stenosis. LVEF estimated at 55-65% by visual estimate. LVEDP was mildly elevated   Senile purpura (HCC)    Spinal stenosis    Tobacco abuse      Current Outpatient Medications  Medication Sig Dispense Refill   albuterol (VENTOLIN HFA) 108 (90 Base) MCG/ACT inhaler TAKE 2 PUFFS BY MOUTH EVERY 6 HOURS AS NEEDED FOR WHEEZE OR SHORTNESS OF BREATH 8.5 each 2   amLODipine (NORVASC) 5 MG tablet Take 1 tablet (5 mg total) by mouth daily. 90 tablet 1   aspirin EC 81 MG tablet Take 1 tablet (81 mg total) by mouth daily. Swallow whole.     BREZTRI AEROSPHERE 160-9-4.8 MCG/ACT AERO INHALE 2 PUFFS INTO THE LUNGS TWICE A DAY 32.1 each 2   carvedilol (COREG) 6.25 MG tablet Take 1 tablet (6.25 mg total) by mouth 2 (two) times daily with a meal. 180 tablet 0   cyclobenzaprine (FLEXERIL) 10 MG tablet Take 1 tablet (10 mg total) by mouth every 8 (eight) hours. 60 tablet 2   lisinopril (ZESTRIL) 40 MG tablet Take 1 tablet (40 mg total) by mouth daily. 90 tablet 3   Melatonin 10 MG TABS Take 10 mg by mouth at bedtime.     montelukast (SINGULAIR) 10 MG tablet Take 1 tablet (10 mg total) by mouth at bedtime. 90 tablet 0   omeprazole (PRILOSEC) 40 MG capsule Take 1 capsule (40 mg total) by mouth daily. 90 capsule 0   oxyCODONE (ROXICODONE) 5 MG immediate release tablet Take 1 tablet (5 mg total) by mouth every 6 (six) hours as needed  for severe pain (pain score 7-10). 120 tablet 0   pregabalin (LYRICA) 300 MG capsule Take 1 capsule (300 mg total) by mouth 2 (two) times daily. 60 capsule 0   rosuvastatin (CRESTOR) 10 MG tablet TAKE 1 TABLET BY MOUTH EVERY DAY 90 tablet 3   Semaglutide-Weight Management (WEGOVY) 0.5 MG/0.5ML SOAJ Inject 0.5 mg into the skin once a week. 6 mL 0   vardenafil (LEVITRA) 10 MG tablet Take 1 tablet (10 mg total) by mouth daily as needed for erectile dysfunction. 10 tablet 5   cyclobenzaprine (FLEXERIL) 5 MG tablet Take 1-2 tablets (5-10 mg total) by mouth 3 (three) times daily as needed. (Patient not taking: Reported on 11/06/2023) 60 tablet 0   HYDROcodone-acetaminophen (NORCO/VICODIN) 5-325 MG tablet Take 1 tablet by mouth every 6  (six) to 8 (eight) hours as needed for pain (Patient not taking: Reported on 11/06/2023) 60 tablet 0   HYDROcodone-acetaminophen (NORCO/VICODIN) 5-325 MG tablet Take 1-2 tablets by mouth every 8 (eight) hours as needed for pain. (Patient not taking: Reported on 11/06/2023) 60 tablet 0   HYDROcodone-acetaminophen (NORCO/VICODIN) 5-325 MG tablet Take 1 tablet by mouth every 6 (six) to 8 (eight) hours as needed. (Patient not taking: Reported on 11/06/2023) 60 tablet 0   HYDROcodone-acetaminophen (NORCO/VICODIN) 5-325 MG tablet Take 1 tablet by mouth every 6 (six) to 8 (eight) hours as needed for pain. (Patient not taking: Reported on 11/06/2023) 60 tablet 0   No current facility-administered medications for this visit.    No Known Allergies  Family History  Problem Relation Age of Onset   Osteoporosis Mother    CAD Father        s/p CABG in his 58's or 93's.   Diabetes Father    Colon polyps Brother    CAD Paternal Grandfather     Social History   Socioeconomic History   Marital status: Divorced    Spouse name: Not on file   Number of children: 1   Years of education: Not on file   Highest education level: 9th grade  Occupational History   Not on file  Tobacco Use   Smoking status: Every Day    Current packs/day: 1.00    Average packs/day: 1 pack/day for 42.0 years (42.0 ttl pk-yrs)    Types: Cigarettes   Smokeless tobacco: Never   Tobacco comments:    About 8 cigarettes per day (04/10/23)  Vaping Use   Vaping status: Never Used  Substance and Sexual Activity   Alcohol use: Not Currently   Drug use: Not Currently   Sexual activity: Yes  Other Topics Concern   Not on file  Social History Narrative   Lives in [place].  He lives in a mobile home in the backyard of his girlfriend.  He works in Aeronautical engineer.   Social Drivers of Corporate investment banker Strain: High Risk (11/02/2023)   Overall Financial Resource Strain (CARDIA)    Difficulty of Paying Living Expenses: Very hard   Food Insecurity: Food Insecurity Present (11/02/2023)   Hunger Vital Sign    Worried About Running Out of Food in the Last Year: Often true    Ran Out of Food in the Last Year: Sometimes true  Transportation Needs: No Transportation Needs (11/02/2023)   PRAPARE - Administrator, Civil Service (Medical): No    Lack of Transportation (Non-Medical): No  Physical Activity: Insufficiently Active (11/02/2023)   Exercise Vital Sign    Days of Exercise per Week: 7 days  Minutes of Exercise per Session: 20 min  Stress: Stress Concern Present (11/02/2023)   [employer] of Occupational Health - Occupational Stress Questionnaire    Feeling of Stress : To some extent  Social Connections: Socially Integrated (11/02/2023)   Social Connection and Isolation Panel [NHANES]    Frequency of Communication with Friends and Family: More than three times a week    Frequency of Social Gatherings with Friends and Family: Once a week    Attends Religious Services: More than 4 times per year    Active Member of [employer] or Organizations: Yes    Attends Banker Meetings: 1 to 4 times per year    Marital Status: Living with partner  Intimate Partner Violence: Patient Declined (06/22/2023)   Humiliation, Afraid, Rape, and Kick questionnaire    Fear of Current or Ex-Partner: Patient declined    Emotionally Abused: Patient declined    Physically Abused: Patient declined    Sexually Abused: Patient declined     Constitutional:  Denies fever, malaise, fatigue, headache or abnormal weight gain.  HEENT: Denies eye pain, eye redness, ear pain, ringing in the ears, wax buildup, runny nose, nasal congestion, bloody nose, or sore throat. Respiratory: Patient reports shortness of breath.  Denies difficulty breathing, cough or sputum production.   Cardiovascular: Patient reports swelling in legs.  Denies chest pain, chest tightness, palpitations or swelling in the hands.  Gastrointestinal: Denies  abdominal pain, bloating, constipation, diarrhea or blood in the stool.  GU: Denies urgency, frequency, pain with urination, burning sensation, blood in urine, odor or discharge. Musculoskeletal: Patient reports chronic back pain.  Denies decrease in range of motion, difficulty with gait, or joint swelling.  Skin: Denies redness, rashes, lesions or ulcercations.  Neurological: Pt reports numbness in legs. Denies dizziness, difficulty with memory, difficulty with speech or problems with balance and coordination.  Psych: Denies anxiety, depression, SI/HI.  No other specific complaints in a complete review of systems (except as listed in HPI above).  Objective:   Physical Exam  BP 138/78 (BP Location: Left Arm, Patient Position: Sitting, Cuff Size: Large)   Ht 6\' 2"  (1.88 m)   Wt 248 lb 6.4 oz (112.7 kg)   BMI 31.89 kg/m   Wt Readings from Last 3 Encounters:  11/06/23 248 lb 6.4 oz (112.7 kg)  10/15/23 252 lb (114.3 kg)  07/12/23 260 lb (117.9 kg)    General: Appears his stated age, obese, in NAD. Skin: Warm, dry and intact.  Senile purpura noted of BUE. HEENT: Head: normal shape and size; Eyes: sclera white, no icterus, conjunctiva pink, PERRLA and EOMs intact;  Neck: Supple with trachea midline.  No masses, lumps or thyromegaly noted. Cardiovascular: Normal rate and rhythm. S1,S2 noted.  No murmur, rubs or gallops noted. No JVD Trace nonpitting BLE edema. No carotid bruits noted. Pulmonary/Chest: Normal effort and positive vesicular breath sounds. No respiratory distress. No wheezes, rales or ronchi noted.  Abdomen: Soft and nontender. Normal bowel sounds.  Musculoskeletal: Strength 5/5 BUE/BLE.  Gait slow and steady without device. Neurological: Alert and oriented. Cranial nerves II-XII grossly intact. Coordination normal.  Psychiatric: Mood and affect normal. Behavior is normal. Judgment and thought content normal.    BMET    Component Value Date/Time   NA 140 05/15/2023 1411    NA 140 10/04/2021 1459   K 4.1 05/15/2023 1411   CL 104 05/15/2023 1411   CO2 27 05/15/2023 1411   GLUCOSE 108 05/15/2023 1411   BUN 10 05/15/2023  1411   BUN 14 10/04/2021 1459   CREATININE 0.69 (L) 05/15/2023 1411   CALCIUM 9.4 05/15/2023 1411   GFRNONAA >60 04/10/2023 0929   GFRNONAA 69 01/30/2020 1115   GFRAA 80 01/30/2020 1115    Lipid Panel     Component Value Date/Time   CHOL 135 11/28/2022 1345   TRIG 181 (H) 11/28/2022 1345   HDL 67 11/28/2022 1345   CHOLHDL 2.0 11/28/2022 1345   VLDL 12 06/24/2018 1725   LDLCALC 42 11/28/2022 1345    CBC    Component Value Date/Time   WBC 7.9 05/15/2023 1411   RBC 4.95 05/15/2023 1411   HGB 14.7 05/15/2023 1411   HGB 16.7 10/04/2021 1459   HCT 45.0 05/15/2023 1411   HCT 49.2 10/04/2021 1459   PLT 240 05/15/2023 1411   PLT 272 10/04/2021 1459   MCV 90.9 05/15/2023 1411   MCV 92 10/04/2021 1459   MCH 29.7 05/15/2023 1411   MCHC 32.7 05/15/2023 1411   RDW 12.6 05/15/2023 1411   RDW 13.1 10/04/2021 1459   LYMPHSABS 2.0 10/04/2021 1459   MONOABS 0.5 08/02/2019 0703   EOSABS 0.1 10/04/2021 1459   BASOSABS 0.1 10/04/2021 1459    Hgb A1C Lab Results  Component Value Date   HGBA1C 5.5 11/28/2022           Assessment & Plan:   Preventative health maintenance:  He declines flu shot He declines tetanus booster He declines Pneumovax or Prevnar He declines Shingrix vaccine He gets lung cancer screening yearly, due 06/2024 He declines referral to GI for screening colonoscopy or Cologuard at this time Encouraged him to consume a balanced diet and exercise regimen Advised him to see an eye doctor and dentist annually We will check CBC, c-Met, lipid, A1c and PSA today   RTC in 6 months for follow-up of chronic conditions Nicki Reaper, NP

## 2023-11-06 NOTE — Assessment & Plan Note (Signed)
Encourage low-carb diet and exercise for weight loss 

## 2023-11-06 NOTE — Patient Instructions (Signed)
 Health Maintenance After Age 67 After age 4, you are at a higher risk for certain long-term diseases and infections as well as injuries from falls. Falls are a major cause of broken bones and head injuries in people who are older than age 47. Getting regular preventive care can help to keep you healthy and well. Preventive care includes getting regular testing and making lifestyle changes as recommended by your health care provider. Talk with your health care provider about: Which screenings and tests you should have. A screening is a test that checks for a disease when you have no symptoms. A diet and exercise plan that is right for you. What should I know about screenings and tests to prevent falls? Screening and testing are the best ways to find a health problem early. Early diagnosis and treatment give you the best chance of managing medical conditions that are common after age 37. Certain conditions and lifestyle choices may make you more likely to have a fall. Your health care provider may recommend: Regular vision checks. Poor vision and conditions such as cataracts can make you more likely to have a fall. If you wear glasses, make sure to get your prescription updated if your vision changes. Medicine review. Work with your health care provider to regularly review all of the medicines you are taking, including over-the-counter medicines. Ask your health care provider about any side effects that may make you more likely to have a fall. Tell your health care provider if any medicines that you take make you feel dizzy or sleepy. Strength and balance checks. Your health care provider may recommend certain tests to check your strength and balance while standing, walking, or changing positions. Foot health exam. Foot pain and numbness, as well as not wearing proper footwear, can make you more likely to have a fall. Screenings, including: Osteoporosis screening. Osteoporosis is a condition that causes  the bones to get weaker and break more easily. Blood pressure screening. Blood pressure changes and medicines to control blood pressure can make you feel dizzy. Depression screening. You may be more likely to have a fall if you have a fear of falling, feel depressed, or feel unable to do activities that you used to do. Alcohol use screening. Using too much alcohol can affect your balance and may make you more likely to have a fall. Follow these instructions at home: Lifestyle Do not drink alcohol if: Your health care provider tells you not to drink. If you drink alcohol: Limit how much you have to: 0-1 drink a day for women. 0-2 drinks a day for men. Know how much alcohol is in your drink. In the [place] one drink equals one 12 oz bottle of beer (355 mL), one 5 oz glass of wine (148 mL), or one 1 oz glass of hard liquor (44 mL). Do not use any products that contain nicotine or tobacco. These products include cigarettes, chewing tobacco, and vaping devices, such as e-cigarettes. If you need help quitting, ask your health care provider. Activity  Follow a regular exercise program to stay fit. This will help you maintain your balance. Ask your health care provider what types of exercise are appropriate for you. If you need a cane or walker, use it as recommended by your health care provider. Wear supportive shoes that have nonskid soles. Safety  Remove any tripping hazards, such as rugs, cords, and clutter. Install safety equipment such as grab bars in bathrooms and safety rails on stairs. Keep rooms and walkways  well-lit. General instructions Talk with your health care provider about your risks for falling. Tell your health care provider if: You fall. Be sure to tell your health care provider about all falls, even ones that seem minor. You feel dizzy, tiredness (fatigue), or off-balance. Take over-the-counter and prescription medicines only as told by your health care provider. These include  supplements. Eat a healthy diet and maintain a healthy weight. A healthy diet includes low-fat dairy products, low-fat (lean) meats, and fiber from whole grains, beans, and lots of fruits and vegetables. Stay current with your vaccines. Schedule regular health, dental, and eye exams. Summary Having a healthy lifestyle and getting preventive care can help to protect your health and wellness after age 11. Screening and testing are the best way to find a health problem early and help you avoid having a fall. Early diagnosis and treatment give you the best chance for managing medical conditions that are more common for people who are older than age 28. Falls are a major cause of broken bones and head injuries in people who are older than age 48. Take precautions to prevent a fall at home. Work with your health care provider to learn what changes you can make to improve your health and wellness and to prevent falls. This information is not intended to replace advice given to you by your health care provider. Make sure you discuss any questions you have with your health care provider. Document Revised: 12/06/2020 Document Reviewed: 12/06/2020 Elsevier Patient Education  2024 [employer].

## 2023-11-07 ENCOUNTER — Other Ambulatory Visit (HOSPITAL_COMMUNITY): Payer: Self-pay

## 2023-11-07 ENCOUNTER — Encounter: Payer: Self-pay | Admitting: Internal Medicine

## 2023-11-07 LAB — LIPID PANEL
Cholesterol: 112 mg/dL (ref <200)
HDL: 54 mg/dL (ref >=40)
LDL Cholesterol (Calc): 41 mg/dL
Non-HDL Cholesterol (Calc): 58 mg/dL (ref <130)
Total CHOL/HDL Ratio: 2.1 (calc) (ref <5.0)
Triglycerides: 86 mg/dL (ref <150)

## 2023-11-07 LAB — CBC
HCT: 46.2 % (ref 38.5–50.0)
Hemoglobin: 15.4 g/dL (ref 13.2–17.1)
MCH: 30.4 pg (ref 27.0–33.0)
MCHC: 33.3 g/dL (ref 32.0–36.0)
MCV: 91.3 fL (ref 80.0–100.0)
MPV: 9.5 fL (ref 7.5–12.5)
Platelets: 237 10*3/uL (ref 140–400)
RBC: 5.06 10*6/uL (ref 4.20–5.80)
RDW: 13.5 % (ref 11.0–15.0)
WBC: 8.8 10*3/uL (ref 3.8–10.8)

## 2023-11-07 LAB — COMPREHENSIVE METABOLIC PANEL WITH GFR
AG Ratio: 2 (calc) (ref 1.0–2.5)
ALT: 19 U/L (ref 9–46)
AST: 12 U/L (ref 10–35)
Albumin: 4.3 g/dL (ref 3.6–5.1)
Alkaline phosphatase (APISO): 96 U/L (ref 35–144)
BUN: 15 mg/dL (ref 7–25)
CO2: 27 mmol/L (ref 20–32)
Calcium: 9.3 mg/dL (ref 8.6–10.3)
Chloride: 105 mmol/L (ref 98–110)
Creat: 0.9 mg/dL (ref 0.70–1.35)
Globulin: 2.1 g/dL (ref 1.9–3.7)
Glucose, Bld: 80 mg/dL (ref 65–99)
Potassium: 4.2 mmol/L (ref 3.5–5.3)
Sodium: 141 mmol/L (ref 135–146)
Total Bilirubin: 0.6 mg/dL (ref 0.2–1.2)
Total Protein: 6.4 g/dL (ref 6.1–8.1)
eGFR: 94 mL/min/{1.73_m2} (ref >=60)

## 2023-11-07 LAB — HEMOGLOBIN A1C
Hgb A1c MFr Bld: 5.8 %{Hb} — ABNORMAL HIGH (ref <5.7)
Mean Plasma Glucose: 120 mg/dL
eAG (mmol/L): 6.6 mmol/L

## 2023-11-07 LAB — PSA: PSA: 1.83 ng/mL (ref <=4.00)

## 2023-11-12 ENCOUNTER — Ambulatory Visit: Attending: Anesthesiology | Admitting: Anesthesiology

## 2023-11-12 ENCOUNTER — Encounter: Payer: Self-pay | Admitting: Anesthesiology

## 2023-11-12 ENCOUNTER — Other Ambulatory Visit (HOSPITAL_COMMUNITY): Payer: Self-pay

## 2023-11-12 DIAGNOSIS — F1190 Opioid use, unspecified, uncomplicated: Secondary | ICD-10-CM | POA: Diagnosis not present

## 2023-11-12 DIAGNOSIS — G894 Chronic pain syndrome: Secondary | ICD-10-CM | POA: Diagnosis not present

## 2023-11-12 DIAGNOSIS — M5431 Sciatica, right side: Secondary | ICD-10-CM

## 2023-11-12 DIAGNOSIS — M545 Low back pain, unspecified: Secondary | ICD-10-CM

## 2023-11-12 DIAGNOSIS — M47816 Spondylosis without myelopathy or radiculopathy, lumbar region: Secondary | ICD-10-CM

## 2023-11-12 DIAGNOSIS — M961 Postlaminectomy syndrome, not elsewhere classified: Secondary | ICD-10-CM | POA: Diagnosis not present

## 2023-11-12 MED ORDER — CYCLOBENZAPRINE HCL 10 MG PO TABS: 10.0000 mg | ORAL_TABLET | Freq: Every day | ORAL | 2 refills | Status: AC

## 2023-11-12 MED ORDER — PREGABALIN 300 MG PO CAPS: 300.0000 mg | ORAL_CAPSULE | Freq: Every day | ORAL | 2 refills | Status: DC

## 2023-11-12 MED ORDER — OXYCODONE HCL 5 MG PO TABS: 5.0000 mg | ORAL_TABLET | Freq: Four times a day (QID) | ORAL | 0 refills | Status: AC | PRN

## 2023-11-12 NOTE — Progress Notes (Unsigned)
 Virtual Visit via Telephone Note  I connected with Bryan Ibarra. on 11/12/23 at  1:00 PM EDT by telephone and verified that I am speaking with the correct person using two identifiers.  Location: Patient: Home Provider: Pain control center  I discussed the limitations, risks, security and privacy concerns of performing an evaluation and management service by telephone and the availability of in person appointments. I also discussed with the patient that there may be a patient responsible charge related to this service. The patient expressed understanding and agreed to proceed.   History of Present Illness: I spoke to [name] via telephone as we are unable link to the video portion comes.  He reports that he did not respond in any significant way to the recent epidural.  He still having considerable amount of low back pain with spasming and right and left lower extremity sciatica symptoms comparable to what he was experiencing before.  No change in lower extremity bowel or bladder function is noted.  He did show improvement to the switch from hydrocodone over to oxycodone.  He is taking this 4 times a day generally getting about 50 to 60% relief lasting about 4 to 5 hours.  Compared to baseline with either the hydrocodone or even worse with no medication he reports that he at least is able to be active functional and sleep better.  He bought himself a cane and is trying to be more active with the warmer weather as well but still having spasming in the lower back.  He takes Flexeril at bedtime in addition to 300 mg of the Lyrica and this works reasonably well to keep his pain under control allowing him to sleep but it does make him very sleepy at night.  Review of systems: General: No fevers or chills Pulmonary: No shortness of breath or dyspnea Cardiac: No angina or palpitations or lightheadedness GI: No abdominal pain or constipation Psych: No depression      Observations/Objective:  Current Outpatient Medications:    albuterol (VENTOLIN HFA) 108 (90 Base) MCG/ACT inhaler, Inhale 1-2 puffs into the lungs every 4 (four) hours as needed for wheezing or shortness of breath., Disp: 8.5 each, Rfl: 2   amLODipine (NORVASC) 5 MG tablet, Take 1 tablet (5 mg total) by mouth daily., Disp: 90 tablet, Rfl: 1   aspirin EC 81 MG tablet, Take 1 tablet (81 mg total) by mouth daily. Swallow whole., Disp: , Rfl:    BREZTRI AEROSPHERE 160-9-4.8 MCG/ACT AERO, INHALE 2 PUFFS INTO THE LUNGS TWICE A DAY, Disp: 32.1 each, Rfl: 2   carvedilol (COREG) 6.25 MG tablet, Take 1 tablet (6.25 mg total) by mouth 2 (two) times daily with a meal., Disp: 180 tablet, Rfl: 0   cyclobenzaprine (FLEXERIL) 10 MG tablet, Take 1 tablet (10 mg total) by mouth at bedtime., Disp: 30 tablet, Rfl: 2   lisinopril (ZESTRIL) 40 MG tablet, Take 1 tablet (40 mg total) by mouth daily., Disp: 90 tablet, Rfl: 3   Melatonin 10 MG TABS, Take 10 mg by mouth at bedtime., Disp: , Rfl:    montelukast (SINGULAIR) 10 MG tablet, Take 1 tablet (10 mg total) by mouth at bedtime., Disp: 90 tablet, Rfl: 0   omeprazole (PRILOSEC) 40 MG capsule, Take 1 capsule (40 mg total) by mouth daily., Disp: 90 capsule, Rfl: 0   [START ON 11/14/2023] oxyCODONE (ROXICODONE) 5 MG immediate release tablet, Take 1 tablet (5 mg total) by mouth every 6 (six) hours as needed for severe  pain (pain score 7-10)., Disp: 120 tablet, Rfl: 0   pregabalin (LYRICA) 300 MG capsule, Take 1 capsule (300 mg total) by mouth daily., Disp: 30 capsule, Rfl: 2   rosuvastatin (CRESTOR) 10 MG tablet, TAKE 1 TABLET BY MOUTH EVERY DAY, Disp: 90 tablet, Rfl: 3   Semaglutide-Weight Management (WEGOVY) 0.5 MG/0.5ML SOAJ, Inject 0.5 mg into the skin once a week., Disp: 6 mL, Rfl: 0   vardenafil (LEVITRA) 10 MG tablet, Take 1 tablet (10 mg total) by mouth daily as needed for erectile dysfunction., Disp: 10 tablet, Rfl: 5   Past Medical History:  Diagnosis Date    Alcohol abuse    a. up to a 12 pack of beer/day. (hx of, not currently) 04/10/23   Arthritis    CAD in native artery    a. LHC 6/18: severe 1 vessel CAD with 90% thrombotic stenosis in the proximal LAD which was felt to be the culprit for the patient's non-STEMI. He underwent successful PCI/DES with a resolute onyx drug-eluting stent (3.0 x 18 mm). There was 0% residual stenosis. LVEF estimated at 55-65% by visual estimate. LVEDP was mildly elevated   Centrilobular emphysema (HCC)    Dyspnea    ED (erectile dysfunction)    GERD (gastroesophageal reflux disease)    H/O echocardiogram    a. echo 6/18: EF 60-65%, normal wall motion, grade 1 diastolic dysfunction, RV cavity size was normal with normal wall thickness and normal RV systolic function. There were no significant valvular abnormalities   Hearing loss    HLD (hyperlipidemia)    Hypertension    Marijuana abuse    hx of. Not currently (04/10/23)   NSTEMI (non-ST elevated myocardial infarction) (HCC)    a. LHC 6/18: severe 1 vessel CAD with 90% thrombotic stenosis in the proximal LAD which was felt to be the culprit for the patient's non-STEMI. He underwent successful PCI/DES with a resolute onyx drug-eluting stent (3.0 x 18 mm). There was 0% residual stenosis. LVEF estimated at 55-65% by visual estimate. LVEDP was mildly elevated   Senile purpura (HCC)    Spinal stenosis    Tobacco abuse    Assessment and Plan:  1. Chronic pain syndrome   2. Chronic, continuous use of opioids   3. Post laminectomy syndrome   4. Sciatica of right side   5. Facet arthritis of lumbar region   6. Low back pain at multiple sites    Based on conversation it is appropriate to refill his medicines for the next 2 months.  We will refill his oxycodone with return to clinic scheduled in 1 month.  Will keep him at the 5 mg 4 times daily dosing.  Hopefully the warmer weather and the increased activity will help with some of the lower extremity spasming and low  back pain.  We may consider an increase to 150 tablet dosing regimen if necessary.  Otherwise continue stretching strengthening exercises continue follow-up with his primary care physician for baseline medical care with return to clinic in 1 month. Follow Up Instructions:    I discussed the assessment and treatment plan with the patient. The patient was provided an opportunity to ask questions and all were answered. The patient agreed with the plan and demonstrated an understanding of the instructions.   The patient was advised to call back or seek an in-person evaluation if the symptoms worsen or if the condition fails to improve as anticipated.  I provided 30 minutes of non-face-to-face time during this encounter.  Zula Hitch, MD

## 2023-11-14 ENCOUNTER — Other Ambulatory Visit: Payer: Self-pay | Admitting: Internal Medicine

## 2023-11-14 DIAGNOSIS — Z0000 Encounter for general adult medical examination without abnormal findings: Secondary | ICD-10-CM

## 2023-11-15 NOTE — Telephone Encounter (Signed)
 Requested Prescriptions  Pending Prescriptions Disp Refills   montelukast (SINGULAIR) 10 MG tablet [Pharmacy Med Name: MONTELUKAST SOD 10 MG TABLET] 90 tablet 0    Sig: TAKE 1 TABLET BY MOUTH EVERYDAY AT BEDTIME     Pulmonology:  Leukotriene Inhibitors Passed - 11/15/2023 10:51 AM      Passed - Valid encounter within last 12 months    Recent Outpatient Visits           1 week ago Encounter for general adult medical examination with abnormal findings   [place] [facility] [place], Rankin Buzzard, NP               omeprazole (PRILOSEC) 40 MG capsule [Pharmacy Med Name: OMEPRAZOLE DR 40 MG CAPSULE] 90 capsule 0    Sig: TAKE 1 CAPSULE (40 MG TOTAL) BY MOUTH DAILY.     Gastroenterology: Proton Pump Inhibitors Passed - 11/15/2023 10:51 AM      Passed - Valid encounter within last 12 months    Recent Outpatient Visits           1 week ago Encounter for general adult medical examination with abnormal findings   [facility] [place], Rankin Buzzard, [place]

## 2023-11-30 ENCOUNTER — Other Ambulatory Visit (HOSPITAL_COMMUNITY): Payer: Self-pay

## 2023-12-04 ENCOUNTER — Other Ambulatory Visit: Payer: Self-pay | Admitting: Internal Medicine

## 2023-12-04 ENCOUNTER — Other Ambulatory Visit (HOSPITAL_COMMUNITY): Payer: Self-pay

## 2023-12-04 DIAGNOSIS — Z0000 Encounter for general adult medical examination without abnormal findings: Secondary | ICD-10-CM

## 2023-12-06 ENCOUNTER — Other Ambulatory Visit: Payer: Self-pay

## 2023-12-06 ENCOUNTER — Other Ambulatory Visit (HOSPITAL_COMMUNITY): Payer: Self-pay

## 2023-12-06 MED ORDER — WEGOVY 0.5 MG/0.5ML ~~LOC~~ SOAJ
0.5000 mg | SUBCUTANEOUS | 0 refills | Status: DC
  Filled 2023-12-06: qty 2, 28d supply, fill #0
  Filled 2023-12-31: qty 2, 28d supply, fill #1
  Filled 2024-01-26: qty 2, 28d supply, fill #2

## 2023-12-06 NOTE — Telephone Encounter (Signed)
 Requested Prescriptions  Pending Prescriptions Disp Refills   Semaglutide -Weight Management (WEGOVY ) 0.5 MG/0.5ML SOAJ 6 mL 0    Sig: Inject 0.5 mg into the skin once a week.     Endocrinology:  Diabetes - GLP-1 Receptor Agonists - semaglutide  Failed - 12/06/2023  1:08 PM      Failed - HBA1C in normal range and within 180 days    Hgb A1c MFr Bld  Date Value Ref Range Status  11/06/2023 5.8 (H) <5.7 % of total Hgb Final    Comment:    For someone without known diabetes, a hemoglobin  A1c value between 5.7% and 6.4% is consistent with prediabetes and should be confirmed with a  follow-up test. . For someone with known diabetes, a value <7% indicates that their diabetes is well controlled. A1c targets should be individualized based on duration of diabetes, age, comorbid conditions, and other considerations. . This assay result is consistent with an increased risk of diabetes. . Currently, no consensus exists regarding use of hemoglobin A1c for diagnosis of diabetes for children. .          Passed - Cr in normal range and within 360 days    Creat  Date Value Ref Range Status  11/06/2023 0.90 0.70 - 1.35 mg/dL Final         Passed - Valid encounter within last 6 months    Recent Outpatient Visits           1 month ago Encounter for general adult medical examination with abnormal findings   [place] [facility] [place], Rankin Buzzard, NP

## 2023-12-07 ENCOUNTER — Other Ambulatory Visit (HOSPITAL_COMMUNITY): Payer: Self-pay

## 2023-12-11 ENCOUNTER — Telehealth: Admitting: Anesthesiology

## 2023-12-12 ENCOUNTER — Encounter: Payer: Self-pay | Admitting: Anesthesiology

## 2023-12-12 ENCOUNTER — Ambulatory Visit: Attending: Anesthesiology | Admitting: Anesthesiology

## 2023-12-12 DIAGNOSIS — F1190 Opioid use, unspecified, uncomplicated: Secondary | ICD-10-CM

## 2023-12-12 DIAGNOSIS — M961 Postlaminectomy syndrome, not elsewhere classified: Secondary | ICD-10-CM

## 2023-12-12 DIAGNOSIS — M47816 Spondylosis without myelopathy or radiculopathy, lumbar region: Secondary | ICD-10-CM | POA: Diagnosis not present

## 2023-12-12 DIAGNOSIS — M5431 Sciatica, right side: Secondary | ICD-10-CM | POA: Diagnosis not present

## 2023-12-12 DIAGNOSIS — M545 Low back pain, unspecified: Secondary | ICD-10-CM

## 2023-12-12 DIAGNOSIS — G894 Chronic pain syndrome: Secondary | ICD-10-CM

## 2023-12-12 MED ORDER — OXYCODONE-ACETAMINOPHEN 7.5-325 MG PO TABS: 1.0000 | ORAL_TABLET | Freq: Four times a day (QID) | ORAL | 0 refills | Status: DC | PRN

## 2023-12-12 NOTE — Progress Notes (Signed)
 Virtual Visit via Telephone Note  I connected with Keyshaun Hofacker. on 12/12/23 at  3:20 PM EDT by telephone and verified that I am speaking with the correct person using two identifiers.  Location: Patient: Home Provider: Pain control center   I discussed the limitations, risks, security and privacy concerns of performing an evaluation and management service by telephone and the availability of in person appointments. I also discussed with the patient that there may be a patient responsible charge related to this service. The patient expressed understanding and agreed to proceed.   History of Present Illness: I spoke with [place] via telephone as we could not link for the video portion of the conference.  He reports that he did not get much relief from the most recent caudal epidural.  He also had a series of lumbar epidurals over [facility] radiology back in the fall of last year without improvement as well.  He was recently seen by his neurosurgeon for possible fusion versus decompressive surgery and found to be a nonsurgical candidate for that as well.  We have reviewed his most recent MRI additionally.  Unfortunately he is not receiving much relief from the epidural steroid injections.  He is on hydrocodone  5 mg 4 times a day and maintains that he still getting minimal relief and remains quite miserable throughout the day.  Really nothing is helping him at this point and he has been limited in his activity capacity.  No side effects with the medications are reported.  He is trying to do his physical therapy exercises but is somewhat limited secondary to pain.  Review of systems: General: No fevers or chills Pulmonary: No shortness of breath or dyspnea Cardiac: No angina or palpitations or lightheadedness GI: No abdominal pain or constipation Psych: No depression    Observations/Objective:  Current Outpatient Medications:    [START ON 12/14/2023] oxyCODONE -acetaminophen   (PERCOCET) 7.5-325 MG tablet, Take 1 tablet by mouth every 6 (six) hours as needed for moderate pain (pain score 4-6) or severe pain (pain score 7-10)., Disp: 120 tablet, Rfl: 0   albuterol  (VENTOLIN  HFA) 108 (90 Base) MCG/ACT inhaler, Inhale 1-2 puffs into the lungs every 4 (four) hours as needed for wheezing or shortness of breath., Disp: 8.5 each, Rfl: 2   amLODipine  (NORVASC ) 5 MG tablet, Take 1 tablet (5 mg total) by mouth daily., Disp: 90 tablet, Rfl: 1   aspirin  EC 81 MG tablet, Take 1 tablet (81 mg total) by mouth daily. Swallow whole., Disp: , Rfl:    BREZTRI  AEROSPHERE 160-9-4.8 MCG/ACT AERO, INHALE 2 PUFFS INTO THE LUNGS TWICE A DAY, Disp: 32.1 each, Rfl: 2   carvedilol  (COREG ) 6.25 MG tablet, Take 1 tablet (6.25 mg total) by mouth 2 (two) times daily with a meal., Disp: 180 tablet, Rfl: 0   cyclobenzaprine  (FLEXERIL ) 10 MG tablet, Take 1 tablet (10 mg total) by mouth at bedtime., Disp: 30 tablet, Rfl: 2   lisinopril  (ZESTRIL ) 40 MG tablet, Take 1 tablet (40 mg total) by mouth daily., Disp: 90 tablet, Rfl: 3   Melatonin 10 MG TABS, Take 10 mg by mouth at bedtime., Disp: , Rfl:    montelukast  (SINGULAIR ) 10 MG tablet, TAKE 1 TABLET BY MOUTH EVERYDAY AT BEDTIME, Disp: 90 tablet, Rfl: 0   omeprazole  (PRILOSEC) 40 MG capsule, TAKE 1 CAPSULE (40 MG TOTAL) BY MOUTH DAILY., Disp: 90 capsule, Rfl: 0   oxyCODONE  (ROXICODONE ) 5 MG immediate release tablet, Take 1 tablet (5 mg total) by mouth every  6 (six) hours as needed for severe pain (pain score 7-10)., Disp: 120 tablet, Rfl: 0   pregabalin  (LYRICA ) 300 MG capsule, Take 1 capsule (300 mg total) by mouth daily., Disp: 30 capsule, Rfl: 2   rosuvastatin  (CRESTOR ) 10 MG tablet, TAKE 1 TABLET BY MOUTH EVERY DAY, Disp: 90 tablet, Rfl: 3   Semaglutide -Weight Management (WEGOVY ) 0.5 MG/0.5ML SOAJ, Inject 0.5 mg into the skin once a week., Disp: 6 mL, Rfl: 0   vardenafil  (LEVITRA ) 10 MG tablet, Take 1 tablet (10 mg total) by mouth daily as needed for  erectile dysfunction., Disp: 10 tablet, Rfl: 5   Past Medical History:  Diagnosis Date   Alcohol abuse    a. up to a 12 pack of beer/day. (hx of, not currently) 04/10/23   Arthritis    CAD in native artery    a. LHC 6/18: severe 1 vessel CAD with 90% thrombotic stenosis in the proximal LAD which was felt to be the culprit for the patient's non-STEMI. He underwent successful PCI/DES with a resolute onyx drug-eluting stent (3.0 x 18 mm). There was 0% residual stenosis. LVEF estimated at 55-65% by visual estimate. LVEDP was mildly elevated   Centrilobular emphysema (HCC)    Dyspnea    ED (erectile dysfunction)    GERD (gastroesophageal reflux disease)    H/O echocardiogram    a. echo 6/18: EF 60-65%, normal wall motion, grade 1 diastolic dysfunction, RV cavity size was normal with normal wall thickness and normal RV systolic function. There were no significant valvular abnormalities   Hearing loss    HLD (hyperlipidemia)    Hypertension    Marijuana abuse    hx of. Not currently (04/10/23)   NSTEMI (non-ST elevated myocardial infarction) (HCC)    a. LHC 6/18: severe 1 vessel CAD with 90% thrombotic stenosis in the proximal LAD which was felt to be the culprit for the patient's non-STEMI. He underwent successful PCI/DES with a resolute onyx drug-eluting stent (3.0 x 18 mm). There was 0% residual stenosis. LVEF estimated at 55-65% by visual estimate. LVEDP was mildly elevated   Senile purpura (HCC)    Spinal stenosis    Tobacco abuse    Assessment and Plan: 1. Chronic pain syndrome   2. Chronic, continuous use of opioids   3. Post laminectomy syndrome   4. Sciatica of right side   5. Facet arthritis of lumbar region   6. Low back pain at multiple sites      Based on our conversation I think it is appropriate to refill his medicine and going to increase him to Percocet 7.5 mg tablets 4 times a day and see if this is more effective for him.  He has no side effects with the medication no  sedation or constipation issues are noted.  Hopefully this will give him more relief as he has failed more conservative therapy and is quite miserable with no surgical option available.  At this point he is effectively activity restricted secondary to pain and is not sleeping well at night.  He reports that he tosses and turns about every hour and cannot get sleep.  I have talked him about opportunities for a dorsal column stimulator and a referral for this.  He will consider that.  Will schedule him for a 1 month return to clinic and continue follow-up with his primary care physicians for baseline medical care. Follow Up Instructions:    I discussed the assessment and treatment plan with the patient. The patient was  provided an opportunity to ask questions and all were answered. The patient agreed with the plan and demonstrated an understanding of the instructions.   The patient was advised to call back or seek an in-person evaluation if the symptoms worsen or if the condition fails to improve as anticipated.  I provided 30 minutes of non-face-to-face time during this encounter.   Zula Hitch, MD

## 2023-12-13 ENCOUNTER — Other Ambulatory Visit (HOSPITAL_COMMUNITY): Payer: Self-pay

## 2023-12-13 ENCOUNTER — Other Ambulatory Visit: Payer: Self-pay

## 2023-12-20 ENCOUNTER — Other Ambulatory Visit: Payer: Self-pay | Admitting: Internal Medicine

## 2023-12-21 NOTE — Telephone Encounter (Signed)
 Requested Prescriptions  Pending Prescriptions Disp Refills   albuterol  (VENTOLIN  HFA) 108 (90 Base) MCG/ACT inhaler [Pharmacy Med Name: ALBUTEROL  HFA 90MCG/ACT (V)] 54 g 3    Sig: USE 1 TO 2 INHALATIONS BY MOUTH  EVERY 4 HOURS AS NEEDED FOR  WHEEZING OR SHORTNESS OF BREATH     Pulmonology:  Beta Agonists 2 Passed - 12/21/2023  2:49 PM      Passed - Last BP in normal range    BP Readings from Last 1 Encounters:  11/06/23 138/78         Passed - Last Heart Rate in normal range    Pulse Readings from Last 1 Encounters:  10/15/23 78         Passed - Valid encounter within last 12 months    Recent Outpatient Visits           1 month ago Encounter for general adult medical examination with abnormal findings   [place] [facility] [place], [place], NP               amLODipine  (NORVASC ) 5 MG tablet [Pharmacy Med Name: amLODIPine  Besylate 5 MG Oral Tablet] 100 tablet 1    Sig: TAKE 1 TABLET BY MOUTH DAILY     Cardiovascular: Calcium  Channel Blockers 2 Passed - 12/21/2023  2:49 PM      Passed - Last BP in normal range    BP Readings from Last 1 Encounters:  11/06/23 138/78         Passed - Last Heart Rate in normal range    Pulse Readings from Last 1 Encounters:  10/15/23 78         Passed - Valid encounter within last 6 months    Recent Outpatient Visits           1 month ago Encounter for general adult medical examination with abnormal findings   [place] [facility] [place], Rankin Buzzard, NP

## 2023-12-25 ENCOUNTER — Other Ambulatory Visit: Payer: Self-pay

## 2023-12-25 ENCOUNTER — Other Ambulatory Visit (HOSPITAL_COMMUNITY): Payer: Self-pay

## 2023-12-31 ENCOUNTER — Other Ambulatory Visit: Payer: Self-pay

## 2024-01-03 ENCOUNTER — Telehealth: Payer: Self-pay

## 2024-01-03 NOTE — Telephone Encounter (Signed)
 If he is not having side effects, we can increase to 1 mg weekly now if he would like

## 2024-01-03 NOTE — Telephone Encounter (Signed)
 LM for patient to call back. Okay to ask patient if he is okay with the increase. Thanks!

## 2024-01-03 NOTE — Telephone Encounter (Signed)
 Copied from CRM [id number]. Topic: Clinical - Medication Question >> Jan 03, 2024  2:43 PM Georgeann Kindred wrote: Reason for CRM: Patient requesting information on his medication Semaglutide -Weight Management (WEGOVY ) 0.5 MG/0.5ML SOAJ. He would like to know when NP Helayne Lo will be increasing his dosage. Please contact patient at [phone] or through MyChart.

## 2024-01-08 ENCOUNTER — Ambulatory Visit: Admitting: Anesthesiology

## 2024-01-09 MED ORDER — OXYCODONE-ACETAMINOPHEN 7.5-325 MG PO TABS: 1.0000 | ORAL_TABLET | Freq: Four times a day (QID) | ORAL | 0 refills | Status: DC | PRN

## 2024-01-11 ENCOUNTER — Ambulatory Visit: Attending: Anesthesiology | Admitting: Anesthesiology

## 2024-01-11 DIAGNOSIS — G894 Chronic pain syndrome: Secondary | ICD-10-CM

## 2024-01-11 DIAGNOSIS — F1190 Opioid use, unspecified, uncomplicated: Secondary | ICD-10-CM

## 2024-01-11 DIAGNOSIS — M47816 Spondylosis without myelopathy or radiculopathy, lumbar region: Secondary | ICD-10-CM | POA: Diagnosis not present

## 2024-01-11 DIAGNOSIS — M545 Low back pain, unspecified: Secondary | ICD-10-CM | POA: Diagnosis not present

## 2024-01-11 DIAGNOSIS — M961 Postlaminectomy syndrome, not elsewhere classified: Secondary | ICD-10-CM | POA: Diagnosis not present

## 2024-01-11 DIAGNOSIS — M5431 Sciatica, right side: Secondary | ICD-10-CM

## 2024-01-14 ENCOUNTER — Telehealth: Admitting: Anesthesiology

## 2024-01-26 ENCOUNTER — Other Ambulatory Visit (HOSPITAL_COMMUNITY): Payer: Self-pay

## 2024-01-31 ENCOUNTER — Other Ambulatory Visit: Payer: Self-pay | Admitting: Internal Medicine

## 2024-01-31 DIAGNOSIS — Z0000 Encounter for general adult medical examination without abnormal findings: Secondary | ICD-10-CM

## 2024-02-03 ENCOUNTER — Other Ambulatory Visit: Payer: Self-pay | Admitting: Anesthesiology

## 2024-02-04 ENCOUNTER — Other Ambulatory Visit (HOSPITAL_COMMUNITY): Payer: Self-pay

## 2024-02-04 ENCOUNTER — Other Ambulatory Visit: Payer: Self-pay

## 2024-02-04 MED ORDER — CARVEDILOL 6.25 MG PO TABS
6.2500 mg | ORAL_TABLET | Freq: Two times a day (BID) | ORAL | 0 refills | Status: DC
Start: 2024-02-04 — End: 2024-02-27
  Filled 2024-02-04 – 2024-02-11 (×2): qty 180, 90d supply, fill #0

## 2024-02-04 NOTE — Telephone Encounter (Signed)
 Requested Prescriptions  Pending Prescriptions Disp Refills   carvedilol  (COREG ) 6.25 MG tablet 180 tablet 0    Sig: Take 1 tablet (6.25 mg total) by mouth 2 (two) times daily with a meal.     Cardiovascular: Beta Blockers 3 Passed - 02/04/2024  2:04 PM      Passed - Cr in normal range and within 360 days    Creat  Date Value Ref Range Status  11/06/2023 0.90 0.70 - 1.35 mg/dL Final         Passed - AST in normal range and within 360 days    AST  Date Value Ref Range Status  11/06/2023 12 10 - 35 U/L Final         Passed - ALT in normal range and within 360 days    ALT  Date Value Ref Range Status  11/06/2023 19 9 - 46 U/L Final         Passed - Last BP in normal range    BP Readings from Last 1 Encounters:  11/06/23 138/78         Passed - Last Heart Rate in normal range    Pulse Readings from Last 1 Encounters:  10/15/23 78         Passed - Valid encounter within last 6 months    Recent Outpatient Visits           3 months ago Encounter for general adult medical examination with abnormal findings   [place] [facility] [place], Angeline ORN, NP

## 2024-02-07 ENCOUNTER — Encounter: Payer: Self-pay | Admitting: Anesthesiology

## 2024-02-07 NOTE — Progress Notes (Signed)
 Virtual Visit via Telephone Note  I connected with Bryan Ibarra. on 02/07/24 at  1:20 PM EDT by telephone and verified that I am speaking with the correct person using two identifiers.  Location: Patient: Home Provider: Pain control center   I discussed the limitations, risks, security and privacy concerns of performing an evaluation and management service by telephone and the availability of in person appointments. I also discussed with the patient that there may be a patient responsible charge related to this service. The patient expressed understanding and agreed to proceed.   History of Present Illness: I spoke with Bryan Ibarra via telephone as we could not link for the video portion of the conference.  He reports that he is doing well with his medication management.  He continues to take his Percocet 7.5 mg tablets about every 6 hours on average and getting good relief with these.  He rates the pain relief at about 75% and this has worked well for him.  He is trying to stay active and with the medications he can do so.  He is having better relief of his low back and right-sided pain with the medicine support and its enable him to perform daily lifestyle routine and sleep better at night.  No side effects are reported.  The quality characteristic and distribution of the pain that he experiences is stable without change recently.  Lower extremity strength function bowel and bladder function have been stable.  Review of systems: General: No fevers or chills Pulmonary: No shortness of breath or dyspnea Cardiac: No angina or palpitations or lightheadedness GI: No abdominal pain or constipation Psych: No depression    Observations/Objective:  Current Outpatient Medications:    albuterol  (VENTOLIN  HFA) 108 (90 Base) MCG/ACT inhaler, USE 1 TO 2 INHALATIONS BY MOUTH  EVERY 4 HOURS AS NEEDED FOR  WHEEZING OR SHORTNESS OF BREATH, Disp: 54 g, Rfl: 3   amLODipine  (NORVASC ) 5 MG tablet,  TAKE 1 TABLET BY MOUTH DAILY, Disp: 100 tablet, Rfl: 1   aspirin  EC 81 MG tablet, Take 1 tablet (81 mg total) by mouth daily. Swallow whole., Disp: , Rfl:    BREZTRI  AEROSPHERE 160-9-4.8 MCG/ACT AERO, INHALE 2 PUFFS INTO THE LUNGS TWICE A DAY, Disp: 32.1 each, Rfl: 2   carvedilol  (COREG ) 6.25 MG tablet, Take 1 tablet (6.25 mg total) by mouth 2 (two) times daily with a meal., Disp: 180 tablet, Rfl: 0   cyclobenzaprine  (FLEXERIL ) 10 MG tablet, Take 1 tablet (10 mg total) by mouth at bedtime., Disp: 30 tablet, Rfl: 2   lisinopril  (ZESTRIL ) 40 MG tablet, Take 1 tablet (40 mg total) by mouth daily., Disp: 90 tablet, Rfl: 3   Melatonin 10 MG TABS, Take 10 mg by mouth at bedtime., Disp: , Rfl:    montelukast  (SINGULAIR ) 10 MG tablet, TAKE 1 TABLET BY MOUTH EVERYDAY AT BEDTIME, Disp: 90 tablet, Rfl: 0   omeprazole  (PRILOSEC) 40 MG capsule, TAKE 1 CAPSULE (40 MG TOTAL) BY MOUTH DAILY., Disp: 90 capsule, Rfl: 0   oxyCODONE -acetaminophen  (PERCOCET) 7.5-325 MG tablet, Take 1 tablet by mouth every 6 (six) hours as needed for moderate pain (pain score 4-6) or severe pain (pain score 7-10)., Disp: 120 tablet, Rfl: 0   pregabalin  (LYRICA ) 300 MG capsule, Take 1 capsule (300 mg total) by mouth daily., Disp: 30 capsule, Rfl: 2   rosuvastatin  (CRESTOR ) 10 MG tablet, TAKE 1 TABLET BY MOUTH EVERY DAY, Disp: 90 tablet, Rfl: 3   Semaglutide -Weight Management (WEGOVY ) 0.5 MG/0.5ML  SOAJ, Inject 0.5 mg into the skin once a week., Disp: 6 mL, Rfl: 0   vardenafil  (LEVITRA ) 10 MG tablet, Take 1 tablet (10 mg total) by mouth daily as needed for erectile dysfunction., Disp: 10 tablet, Rfl: 5   Past Medical History:  Diagnosis Date   Alcohol abuse    a. up to a 12 pack of beer/day. (hx of, not currently) 04/10/23   Arthritis    CAD in native artery    a. LHC 6/18: severe 1 vessel CAD with 90% thrombotic stenosis in the proximal LAD which was felt to be the culprit for the patient's non-STEMI. He underwent successful PCI/DES  with a resolute onyx drug-eluting stent (3.0 x 18 mm). There was 0% residual stenosis. LVEF estimated at 55-65% by visual estimate. LVEDP was mildly elevated   Centrilobular emphysema (HCC)    Dyspnea    ED (erectile dysfunction)    GERD (gastroesophageal reflux disease)    H/O echocardiogram    a. echo 6/18: EF 60-65%, normal wall motion, grade 1 diastolic dysfunction, RV cavity size was normal with normal wall thickness and normal RV systolic function. There were no significant valvular abnormalities   Hearing loss    HLD (hyperlipidemia)    Hypertension    Marijuana abuse    hx of. Not currently (04/10/23)   NSTEMI (non-ST elevated myocardial infarction) (HCC)    a. LHC 6/18: severe 1 vessel CAD with 90% thrombotic stenosis in the proximal LAD which was felt to be the culprit for the patient's non-STEMI. He underwent successful PCI/DES with a resolute onyx drug-eluting stent (3.0 x 18 mm). There was 0% residual stenosis. LVEF estimated at 55-65% by visual estimate. LVEDP was mildly elevated   Senile purpura (HCC)    Spinal stenosis    Tobacco abuse    Assessment and Plan: 1. Chronic pain syndrome   2. Chronic, continuous use of opioids   3. Post laminectomy syndrome   4. Sciatica of right side   5. Facet arthritis of lumbar region   6. Low back pain at multiple sites    Based on our conversation today and upon review of the [place]  practitioner database information I think is appropriate to refill his medicines for the next month.  This will be for Percocet 7.5 mg tablets which continue to work effectively for him.  No side effects reported.  He is using the medications as prescribed and getting good relief.  Overall lifestyle and function have been improved.  Continue with core stretching strengthening exercises as tolerated with return to clinic scheduled in 1 month.  Follow Up Instructions:    I discussed the assessment and treatment plan with the patient. The patient was  provided an opportunity to ask questions and all were answered. The patient agreed with the plan and demonstrated an understanding of the instructions.   The patient was advised to call back or seek an in-person evaluation if the symptoms worsen or if the condition fails to improve as anticipated.  I provided 30 minutes of non-face-to-face time during this encounter.   [place] KANDICE Clause, MD

## 2024-02-11 ENCOUNTER — Encounter: Payer: Self-pay | Admitting: Anesthesiology

## 2024-02-11 ENCOUNTER — Other Ambulatory Visit (HOSPITAL_COMMUNITY): Payer: Self-pay

## 2024-02-11 ENCOUNTER — Ambulatory Visit: Attending: Anesthesiology | Admitting: Anesthesiology

## 2024-02-11 DIAGNOSIS — M5431 Sciatica, right side: Secondary | ICD-10-CM

## 2024-02-11 DIAGNOSIS — G894 Chronic pain syndrome: Secondary | ICD-10-CM | POA: Diagnosis not present

## 2024-02-11 DIAGNOSIS — M545 Low back pain, unspecified: Secondary | ICD-10-CM | POA: Diagnosis not present

## 2024-02-11 DIAGNOSIS — M47816 Spondylosis without myelopathy or radiculopathy, lumbar region: Secondary | ICD-10-CM

## 2024-02-11 DIAGNOSIS — F1190 Opioid use, unspecified, uncomplicated: Secondary | ICD-10-CM | POA: Diagnosis not present

## 2024-02-11 DIAGNOSIS — M961 Postlaminectomy syndrome, not elsewhere classified: Secondary | ICD-10-CM | POA: Diagnosis not present

## 2024-02-11 MED ORDER — CYCLOBENZAPRINE HCL 10 MG PO TABS
10.0000 mg | ORAL_TABLET | Freq: Every day | ORAL | 5 refills | Status: AC
Start: 2024-02-11 — End: 2024-03-12

## 2024-02-11 MED ORDER — OXYCODONE-ACETAMINOPHEN 7.5-325 MG PO TABS: 1.0000 | ORAL_TABLET | Freq: Four times a day (QID) | ORAL | 0 refills | Status: AC | PRN

## 2024-02-11 NOTE — Progress Notes (Signed)
 Virtual Visit via Telephone Note  I connected with Bryan Ibarra. on 02/11/24 at  1:00 PM EDT by telephone and verified that I am speaking with the correct person using two identifiers.  Location: Patient: Home Provider: Pain control center   I discussed the limitations, risks, security and privacy concerns of performing an evaluation and management service by telephone and the availability of in person appointments. I also discussed with the patient that there may be a patient responsible charge related to this service. The patient expressed understanding and agreed to proceed.   History of Present Illness: I spoke with Bryan Ibarra via telephone as he was unable in for the video portion of the office.  He reports that he has been doing better on the oxycodone  7.5 mg strength.  He still has his usual pain in the low back hip buttock region going down the right leg worse than the left leg.  This is her baseline.  He has failed more conservative therapy and has been reliant on chronic opioid maintenance.  The medications are working well for him and enable him to be more active and functional at home.  He is able to do his daily activities do some walking around the house and sleep better at night.  He has been limited with outdoor activity secondary to the heat.  The quality characteristic and distribution of the pain is otherwise stable without change.  No problems with bowel or bladder function are noted.  Lower extremity strength and function have been at baseline.  No side effects of the medication are reported.  Review of systems: General: No fevers or chills Pulmonary: No shortness of breath or dyspnea Cardiac: No angina or palpitations or lightheadedness GI: No abdominal pain or constipation Psych: No depression    Observations/Objective:  Current Outpatient Medications:    cyclobenzaprine  (FLEXERIL ) 10 MG tablet, Take 1 tablet (10 mg total) by mouth at bedtime., Disp: 30  tablet, Rfl: 5   [START ON 03/13/2024] oxyCODONE -acetaminophen  (PERCOCET) 7.5-325 MG tablet, Take 1 tablet by mouth every 6 (six) hours as needed for moderate pain (pain score 4-6) or severe pain (pain score 7-10)., Disp: 120 tablet, Rfl: 0   albuterol  (VENTOLIN  HFA) 108 (90 Base) MCG/ACT inhaler, USE 1 TO 2 INHALATIONS BY MOUTH  EVERY 4 HOURS AS NEEDED FOR  WHEEZING OR SHORTNESS OF BREATH, Disp: 54 g, Rfl: 3   amLODipine  (NORVASC ) 5 MG tablet, TAKE 1 TABLET BY MOUTH DAILY, Disp: 100 tablet, Rfl: 1   aspirin  EC 81 MG tablet, Take 1 tablet (81 mg total) by mouth daily. Swallow whole., Disp: , Rfl:    BREZTRI  AEROSPHERE 160-9-4.8 MCG/ACT AERO, INHALE 2 PUFFS INTO THE LUNGS TWICE A DAY, Disp: 32.1 each, Rfl: 2   carvedilol  (COREG ) 6.25 MG tablet, Take 1 tablet (6.25 mg total) by mouth 2 (two) times daily with a meal., Disp: 180 tablet, Rfl: 0   lisinopril  (ZESTRIL ) 40 MG tablet, Take 1 tablet (40 mg total) by mouth daily., Disp: 90 tablet, Rfl: 3   Melatonin 10 MG TABS, Take 10 mg by mouth at bedtime., Disp: , Rfl:    montelukast  (SINGULAIR ) 10 MG tablet, TAKE 1 TABLET BY MOUTH EVERYDAY AT BEDTIME, Disp: 90 tablet, Rfl: 0   omeprazole  (PRILOSEC) 40 MG capsule, TAKE 1 CAPSULE (40 MG TOTAL) BY MOUTH DAILY., Disp: 90 capsule, Rfl: 0   [START ON 02/12/2024] oxyCODONE -acetaminophen  (PERCOCET) 7.5-325 MG tablet, Take 1 tablet by mouth every 6 (six) hours as needed for  moderate pain (pain score 4-6) or severe pain (pain score 7-10)., Disp: 120 tablet, Rfl: 0   pregabalin  (LYRICA ) 300 MG capsule, Take 1 capsule (300 mg total) by mouth daily., Disp: 30 capsule, Rfl: 2   rosuvastatin  (CRESTOR ) 10 MG tablet, TAKE 1 TABLET BY MOUTH EVERY DAY, Disp: 90 tablet, Rfl: 3   Semaglutide -Weight Management (WEGOVY ) 0.5 MG/0.5ML SOAJ, Inject 0.5 mg into the skin once a week., Disp: 6 mL, Rfl: 0   vardenafil  (LEVITRA ) 10 MG tablet, Take 1 tablet (10 mg total) by mouth daily as needed for erectile dysfunction., Disp: 10 tablet,  Rfl: 5   Past Medical History:  Diagnosis Date   Alcohol abuse    a. up to a 12 pack of beer/day. (hx of, not currently) 04/10/23   Arthritis    CAD in native artery    a. LHC 6/18: severe 1 vessel CAD with 90% thrombotic stenosis in the proximal LAD which was felt to be the culprit for the patient's non-STEMI. He underwent successful PCI/DES with a resolute onyx drug-eluting stent (3.0 x 18 mm). There was 0% residual stenosis. LVEF estimated at 55-65% by visual estimate. LVEDP was mildly elevated   Centrilobular emphysema (HCC)    Dyspnea    ED (erectile dysfunction)    GERD (gastroesophageal reflux disease)    H/O echocardiogram    a. echo 6/18: EF 60-65%, normal wall motion, grade 1 diastolic dysfunction, RV cavity size was normal with normal wall thickness and normal RV systolic function. There were no significant valvular abnormalities   Hearing loss    HLD (hyperlipidemia)    Hypertension    Marijuana abuse    hx of. Not currently (04/10/23)   NSTEMI (non-ST elevated myocardial infarction) (HCC)    a. LHC 6/18: severe 1 vessel CAD with 90% thrombotic stenosis in the proximal LAD which was felt to be the culprit for the patient's non-STEMI. He underwent successful PCI/DES with a resolute onyx drug-eluting stent (3.0 x 18 mm). There was 0% residual stenosis. LVEF estimated at 55-65% by visual estimate. LVEDP was mildly elevated   Senile purpura (HCC)    Spinal stenosis    Tobacco abuse    Assessment and Plan:   1. Chronic pain syndrome   2. Chronic, continuous use of opioids   3. Post laminectomy syndrome   4. Sciatica of right side   5. Facet arthritis of lumbar region   6. Low back pain at multiple sites    Based on our conversation after review of the [place]  practitioner database information it is appropriate to refill his medicines for the next 2 months.  This to be dated for July 15 and August 14.  This to be for 1 tablet 4 times a day.  Continue Flexeril  with  refill today.  Continue Lyrica  as well.  Continue with core stretching strengthening exercises as reviewed.  No other changes in his regimen are initiated.  Continue follow-up with his primary care physicians for routine monitoring with return to clinic scheduled in 2 months.    Follow Up Instructions:    I discussed the assessment and treatment plan with the patient. The patient was provided an opportunity to ask questions and all were answered. The patient agreed with the plan and demonstrated an understanding of the instructions.   The patient was advised to call back or seek an in-person evaluation if the symptoms worsen or if the condition fails to improve as anticipated.  I provided 30 minutes of non-face-to-face  time during this encounter.   Lynwood KANDICE Clause, MD

## 2024-02-23 ENCOUNTER — Other Ambulatory Visit: Payer: Self-pay | Admitting: Internal Medicine

## 2024-02-25 ENCOUNTER — Other Ambulatory Visit (HOSPITAL_COMMUNITY): Payer: Self-pay

## 2024-02-25 MED ORDER — WEGOVY 0.5 MG/0.5ML ~~LOC~~ SOAJ
0.5000 mg | SUBCUTANEOUS | 0 refills | Status: DC
  Filled 2024-02-25: qty 2, 28d supply, fill #0
  Filled 2024-03-20: qty 2, 28d supply, fill #1
  Filled 2024-04-26: qty 2, 28d supply, fill #2

## 2024-02-25 NOTE — Telephone Encounter (Signed)
 Requested Prescriptions  Pending Prescriptions Disp Refills   Semaglutide -Weight Management (WEGOVY ) 0.5 MG/0.5ML SOAJ 6 mL 0    Sig: Inject 0.5 mg into the skin once a week.     Endocrinology:  Diabetes - GLP-1 Receptor Agonists - semaglutide  Failed - 02/25/2024  3:36 PM      Failed - HBA1C in normal range and within 180 days    Hgb A1c MFr Bld  Date Value Ref Range Status  11/06/2023 5.8 (H) <5.7 % of total Hgb Final    Comment:    For someone without known diabetes, a hemoglobin  A1c value between 5.7% and 6.4% is consistent with prediabetes and should be confirmed with a  follow-up test. . For someone with known diabetes, a value <7% indicates that their diabetes is well controlled. A1c targets should be individualized based on duration of diabetes, age, comorbid conditions, and other considerations. . This assay result is consistent with an increased risk of diabetes. . Currently, no consensus exists regarding use of hemoglobin A1c for diagnosis of diabetes for children. .          Passed - Cr in normal range and within 360 days    Creat  Date Value Ref Range Status  11/06/2023 0.90 0.70 - 1.35 mg/dL Final         Passed - Valid encounter within last 6 months    Recent Outpatient Visits           3 months ago Encounter for general adult medical examination with abnormal findings   [place] [facility] [place], Angeline ORN, NP       Future Appointments             In 2 days Gerard Frederick, NP  [facility]

## 2024-02-27 ENCOUNTER — Encounter: Payer: Self-pay | Admitting: Cardiology

## 2024-02-27 ENCOUNTER — Ambulatory Visit: Attending: Cardiology | Admitting: Cardiology

## 2024-02-27 VITALS — BP 124/64 | HR 88 | Ht 74.0 in | Wt 241.6 lb

## 2024-02-27 DIAGNOSIS — I10 Essential (primary) hypertension: Secondary | ICD-10-CM | POA: Diagnosis not present

## 2024-02-27 DIAGNOSIS — Z0000 Encounter for general adult medical examination without abnormal findings: Secondary | ICD-10-CM

## 2024-02-27 DIAGNOSIS — E785 Hyperlipidemia, unspecified: Secondary | ICD-10-CM

## 2024-02-27 DIAGNOSIS — R600 Localized edema: Secondary | ICD-10-CM | POA: Diagnosis not present

## 2024-02-27 DIAGNOSIS — I2510 Atherosclerotic heart disease of native coronary artery without angina pectoris: Secondary | ICD-10-CM

## 2024-02-27 DIAGNOSIS — Z79899 Other long term (current) drug therapy: Secondary | ICD-10-CM

## 2024-02-27 DIAGNOSIS — Z720 Tobacco use: Secondary | ICD-10-CM | POA: Diagnosis not present

## 2024-02-27 MED ORDER — ROSUVASTATIN CALCIUM 10 MG PO TABS: ORAL_TABLET | ORAL | 3 refills | Status: AC

## 2024-02-27 MED ORDER — LISINOPRIL 40 MG PO TABS: 40.0000 mg | ORAL_TABLET | Freq: Every day | ORAL | 3 refills | Status: AC

## 2024-02-27 MED ORDER — AMLODIPINE BESYLATE 5 MG PO TABS: 5.0000 mg | ORAL_TABLET | Freq: Every day | ORAL | 3 refills | Status: DC

## 2024-02-27 MED ORDER — ASPIRIN 81 MG PO TBEC: 81.0000 mg | DELAYED_RELEASE_TABLET | ORAL | Status: AC

## 2024-02-27 MED ORDER — CARVEDILOL 6.25 MG PO TABS: 6.2500 mg | ORAL_TABLET | Freq: Two times a day (BID) | ORAL | 3 refills | Status: AC

## 2024-02-27 NOTE — Patient Instructions (Addendum)
 Medication Instructions:  Your physician recommends the following medication changes.  DECREASE: Aspirin  to 81 mg by mouth 3 times a week ( Monday, Wednesday, Friday)   *If you need a refill on your cardiac medications before your next appointment, please call your pharmacy*  Lab Work: Your provider would like for you to have following labs drawn today BMP, Mg.     Testing/Procedures: No test ordered today   Follow-Up: At [facility], you and your health needs are our priority.  As part of our continuing mission to provide you with exceptional heart care, our providers are all part of one team.  This team includes your primary Cardiologist (physician) and Advanced Practice Providers or APPs (Physician Assistants and Nurse Practitioners) who all work together to provide you with the care you need, when you need it.  Your next appointment:   6 month(s)  Provider:   Deatrice Cage, MD or Tylene Lunch, NP

## 2024-02-27 NOTE — Progress Notes (Signed)
 Cardiology Office Note   Date:  02/27/2024  ID:  Bryan Ibarra., DOB January 31, 1956, MRN [id number] PCP: Antonette Angeline ORN, NP  [facility] Providers Cardiologist:  Deatrice Cage, MD     History of Present Illness Bryan Ibarra. is a 68 y.o. male with past medical history of coronary artery disease, hypertension, polysubstance abuse including tobacco/alcohol/marijuana and remote history of cocaine use, COPD, who is here today for follow-up.   He suffered a non-ST elevated myocardial infarction in June 2018.  He underwent PCI/DES placement to the LAD without complications.  At that time ejection fraction was normal.  Echocardiogram completed in September 2022 showed normal LV systolic function, normal diastolic function with no significant valvular abnormalities.  He was seen in clinic 09/2021 by Dr. Cage.  At that time he denied any chest pain but had increased swelling to his bilateral lower extremities.  At that time he stated he had significantly cut down on on alcohol use.  He did decide to retrial statin therapy and was sent for follow-up blood work, previously stated he was intolerant and had stopped taking rosuvastatin  due to dizziness.    He was last seen in clinic 06/14/2023 by Dr.Arida.  At that time he had recently undergone lumbar spine surgery and had been limited by severe back pain.  He had stopped drinking alcohol altogether and cut down tobacco use.  He stated he had ran out of blood pressure medication and month prior to his appointment.  Continue to have lower extremity edema but denied any chest pain or shortness of breath.  With continued swelling to his bilateral lower extremities he was restarted on lisinopril  40 mg once daily and amlodipine  was discontinued.  He returns to clinic today stating for the cardiac perspective he has been doing well.  Denies any chest pain, shortness of breath or palpitations.  Continues to have some lower back pain from spinal  surgery approximately 5 months ago and has been suffering from chronic sciatica.  Continues to have lower extremity edema.  Continues to be managed by pain clinic.  States that he has been compliant with his current medication regimen.  unfortunately has concerns today with extensive bruising noted to his upper extremities and has questions about stopping some of his medications.  He denies any recent hospitalizations or visits to the emergency department.  ROS: 10 point review of systems has been reviewed and considered negative except ones been listed in the HPI  Studies Reviewed EKG Interpretation Date/Time:  Wednesday February 27 2024 11:06:44 EDT Ventricular Rate:  88 PR Interval:  182 QRS Duration:  92 QT Interval:  356 QTC Calculation: 430 R Axis:   -77  Text Interpretation: Sinus rhythm with Premature supraventricular complexes Left axis deviation When compared with ECG of 14-Jun-2023 11:05, Premature supraventricular complexes are now Present Confirmed by Gerard Frederick ([id number]) on 02/27/2024 11:34:52 AM    2D echo 04/26/2021 1. Left ventricular ejection fraction, by estimation, is 60 to 65%. The  left ventricle has normal function. The left ventricle has no regional  wall motion abnormalities. Left ventricular diastolic parameters are  consistent with Grade II diastolic  dysfunction (pseudonormalization).   2. Right ventricular systolic function is normal. The right ventricular  size is normal. Tricuspid regurgitation signal is inadequate for assessing  PA pressure.   3. The mitral valve is grossly normal. Trivial mitral valve  regurgitation. No evidence of mitral stenosis.   4. The aortic valve was not well visualized. Aortic  valve regurgitation  is trivial. No aortic stenosis is present.   5. Aortic dilatation noted. There is borderline dilatation of the  ascending aorta, measuring 36 mm.   6. The inferior vena cava is normal in size with greater than 50%  respiratory  variability, suggesting right atrial pressure of 3 mmHg.  Risk Assessment/Calculations         Physical Exam VS:  BP 124/64 (BP Location: Left Arm, Patient Position: Sitting, Cuff Size: Normal)   Pulse 88   Ht 6' 2 (1.88 m)   Wt 241 lb 9.6 oz (109.6 kg)   SpO2 98%   BMI 31.02 kg/m        Wt Readings from Last 3 Encounters:  02/27/24 241 lb 9.6 oz (109.6 kg)  11/06/23 248 lb 6.4 oz (112.7 kg)  10/15/23 252 lb (114.3 kg)    GEN: Well nourished, well developed in no acute distress NECK: No JVD; No carotid bruits CARDIAC: RRR, no murmurs, rubs, gallops RESPIRATORY:  Clear to auscultation without rales, wheezing or rhonchi  ABDOMEN: Soft, non-tender, non-distended EXTREMITIES:  No edema; No deformity   ASSESSMENT AND PLAN Coronary artery disease involving native coronary arteries without angina.  Has been doing well without anginal symptoms.  EKG today reveals sinus rhythm with unifocal PVCs and left axis deviation but no ischemic changes noted.  With PVCs noted on EKG he has been sent for an updated BMP and magnesium.  He was questioning discontinuing aspirin  altogether due to increased amount of bruising advised not to discontinue aspirin  81 mg daily but decrease it to Monday Wednesday and Friday he is also continued on rosuvastatin  10 mg daily.  No further ischemic testing is warranted at this time.  Primary hypertension with a blood pressure of 124/64.  Blood pressure has been recently controlled.  He has been continued on amlodipine  5 mg daily, carvedilol  6.25 mg twice daily, lisinopril  40 mg daily.  He has been encouraged to continue to monitor his blood pressure 1 to 2 hours postmedication administration as well.  Prior notes stated that amlodipine  was discontinued patient has continued on medication with the last refill on 12/27/2023.  He has been advised to take his amlodipine  in the evening to reduce some of the peripheral edema he has concerns about today.  Mixed hyperlipidemia  with last LDL of 41.  Has remained at goal of less than 70.  He is continued on rosuvastatin  10 mg daily.  Tobacco use where he has continued to cut down on his smoking with total cessation being recommended.  Peripheral edema to the bilateral lower extremities predominantly over the feet and ankles.  Likely multifactorial and a component of amlodipine  and venous insufficiency.  He has been encouraged to take his amlodipine  in the evening.  He has also been encouraged to participate in conservative therapy of elevating his extremities, foot calf pump exercises, decrease sodium intake, and compression socks.       Dispo: Patient to return to clinic to see MD/APP in 6 months or sooner if needed for further evaluation.  Signed, Zellie Jenning, NP

## 2024-02-28 ENCOUNTER — Ambulatory Visit: Payer: Self-pay | Admitting: Cardiology

## 2024-02-28 LAB — BASIC METABOLIC PANEL WITH GFR
BUN/Creatinine Ratio: 17 (ref 10–24)
BUN: 14 mg/dL (ref 8–27)
CO2: 19 mmol/L — ABNORMAL LOW (ref 20–29)
Calcium: 9.2 mg/dL (ref 8.6–10.2)
Chloride: 106 mmol/L (ref 96–106)
Creatinine, Ser: 0.82 mg/dL (ref 0.76–1.27)
Glucose: 120 mg/dL — ABNORMAL HIGH (ref 70–99)
Potassium: 4.1 mmol/L (ref 3.5–5.2)
Sodium: 144 mmol/L (ref 134–144)
eGFR: 96 mL/min/1.73 (ref >59)

## 2024-02-28 LAB — MAGNESIUM: Magnesium: 2.1 mg/dL (ref 1.6–2.3)

## 2024-02-28 NOTE — Progress Notes (Signed)
 Kidney function, potassium, and magnesium have all remained stable.  No changes to current medication regimen at this time.

## 2024-03-19 ENCOUNTER — Other Ambulatory Visit: Payer: Self-pay | Admitting: Internal Medicine

## 2024-03-19 DIAGNOSIS — Z0000 Encounter for general adult medical examination without abnormal findings: Secondary | ICD-10-CM

## 2024-03-20 ENCOUNTER — Other Ambulatory Visit (HOSPITAL_COMMUNITY): Payer: Self-pay

## 2024-03-20 NOTE — Telephone Encounter (Signed)
 Requested Prescriptions  Pending Prescriptions Disp Refills   montelukast  (SINGULAIR ) 10 MG tablet [Pharmacy Med Name: MONTELUKAST  SOD 10 MG TABLET] 90 tablet 0    Sig: TAKE 1 TABLET BY MOUTH EVERYDAY AT BEDTIME     Pulmonology:  Leukotriene Inhibitors Passed - 03/20/2024  1:10 PM      Passed - Valid encounter within last 12 months    Recent Outpatient Visits           4 months ago Encounter for general adult medical examination with abnormal findings   [place] [facility] [place], Angeline ORN, NP

## 2024-03-25 ENCOUNTER — Other Ambulatory Visit: Payer: Self-pay | Admitting: Internal Medicine

## 2024-03-26 NOTE — Telephone Encounter (Signed)
 Too soon for refill, LRF 12/21/23 for 54 g and 3 RF. Requested Prescriptions  Pending Prescriptions Disp Refills   albuterol  (VENTOLIN  HFA) 108 (90 Base) MCG/ACT inhaler [Pharmacy Med Name: ALBUTEROL  HFA (PROAIR ) INHALER] 8.5 each 2    Sig: TAKE 2 PUFFS BY MOUTH EVERY 6 HOURS AS NEEDED FOR WHEEZE OR SHORTNESS OF BREATH     Pulmonology:  Beta Agonists 2 Passed - 03/26/2024  9:42 AM      Passed - Last BP in normal range    BP Readings from Last 1 Encounters:  02/27/24 124/64         Passed - Last Heart Rate in normal range    Pulse Readings from Last 1 Encounters:  02/27/24 88         Passed - Valid encounter within last 12 months    Recent Outpatient Visits           4 months ago Encounter for general adult medical examination with abnormal findings   [facility] [place], Angeline ORN, NP

## 2024-03-30 ENCOUNTER — Other Ambulatory Visit: Payer: Self-pay | Admitting: Anesthesiology

## 2024-04-10 ENCOUNTER — Encounter: Payer: Self-pay | Admitting: Anesthesiology

## 2024-04-10 ENCOUNTER — Ambulatory Visit: Attending: Anesthesiology | Admitting: Anesthesiology

## 2024-04-10 DIAGNOSIS — M545 Low back pain, unspecified: Secondary | ICD-10-CM

## 2024-04-10 DIAGNOSIS — F1190 Opioid use, unspecified, uncomplicated: Secondary | ICD-10-CM | POA: Diagnosis not present

## 2024-04-10 DIAGNOSIS — G894 Chronic pain syndrome: Secondary | ICD-10-CM | POA: Diagnosis not present

## 2024-04-10 DIAGNOSIS — M961 Postlaminectomy syndrome, not elsewhere classified: Secondary | ICD-10-CM

## 2024-04-10 DIAGNOSIS — M47816 Spondylosis without myelopathy or radiculopathy, lumbar region: Secondary | ICD-10-CM | POA: Diagnosis not present

## 2024-04-10 DIAGNOSIS — M5431 Sciatica, right side: Secondary | ICD-10-CM

## 2024-04-10 MED ORDER — PREGABALIN 300 MG PO CAPS: 300.0000 mg | ORAL_CAPSULE | Freq: Every day | ORAL | 2 refills | Status: DC

## 2024-04-10 MED ORDER — OXYCODONE-ACETAMINOPHEN 10-325 MG PO TABS: 1.0000 | ORAL_TABLET | Freq: Three times a day (TID) | ORAL | 0 refills | Status: DC | PRN

## 2024-04-10 NOTE — Progress Notes (Signed)
 Virtual Visit via Telephone Note  I connected with Bryan Ibarra. on 04/10/24 at  8:20 AM EDT by telephone and verified that I am speaking with the correct person using two identifiers.  Location: Patient: Home Provider: [facility] control center   I discussed the limitations, risks, security and privacy concerns of performing an evaluation and management service by telephone and the availability of in person appointments. I also discussed with the patient that there may be a patient responsible charge related to this service. The patient expressed understanding and agreed to proceed.   History of Present Illness: I spoke to Bryan Ibarra via telephone as we are unable in for the video portion of the conference.  He reports that his pain persists in the low back hips buttocks and especially down the right leg.  He has had previous injection therapy including epidural and facet blocks but these have not given him any relief.  No change in lower extremity strength or function bowel or bladder function is noted.  He takes his Percocet 7.5 mg tablets 4 times a day and gets about 30% relief as reported today.  He is not taking any additional Tylenol  or anti-inflammatories.  He takes his pregabalin  300 mg once a day.  The medications help but he still is limited in activity secondary to the persistence of pain which also impacts his sleep at night.  Otherwise he is doing his stretching exercises with limited success  Review of systems: General: No fevers or chills Pulmonary: No shortness of breath or dyspnea Cardiac: No angina or palpitations or lightheadedness GI: No abdominal pain or constipation Psych: No depression    Observations/Objective:  Current Outpatient Medications:    [START ON 04/13/2024] oxyCODONE -acetaminophen  (PERCOCET) 10-325 MG tablet, Take 1 tablet by mouth every 8 (eight) hours as needed for pain., Disp: 90 tablet, Rfl: 0   albuterol  (VENTOLIN  HFA) 108 (90 Base) MCG/ACT  inhaler, USE 1 TO 2 INHALATIONS BY MOUTH  EVERY 4 HOURS AS NEEDED FOR  WHEEZING OR SHORTNESS OF BREATH, Disp: 54 g, Rfl: 3   amLODipine  (NORVASC ) 5 MG tablet, Take 1 tablet (5 mg total) by mouth daily., Disp: 90 tablet, Rfl: 3   aspirin  EC 81 MG tablet, Take 1 tablet (81 mg total) by mouth 3 (three) times a week. On Monday, Wednesday, Friday (Swallow whole)., Disp: , Rfl:    BREZTRI  AEROSPHERE 160-9-4.8 MCG/ACT AERO, INHALE 2 PUFFS INTO THE LUNGS TWICE A DAY, Disp: 32.1 each, Rfl: 2   carvedilol  (COREG ) 6.25 MG tablet, Take 1 tablet (6.25 mg total) by mouth 2 (two) times daily with a meal., Disp: 180 tablet, Rfl: 3   lisinopril  (ZESTRIL ) 40 MG tablet, Take 1 tablet (40 mg total) by mouth daily., Disp: 90 tablet, Rfl: 3   Melatonin 10 MG TABS, Take 10 mg by mouth at bedtime., Disp: , Rfl:    montelukast  (SINGULAIR ) 10 MG tablet, TAKE 1 TABLET BY MOUTH EVERYDAY AT BEDTIME, Disp: 90 tablet, Rfl: 0   omeprazole  (PRILOSEC) 40 MG capsule, TAKE 1 CAPSULE (40 MG TOTAL) BY MOUTH DAILY., Disp: 90 capsule, Rfl: 0   oxyCODONE -acetaminophen  (PERCOCET) 7.5-325 MG tablet, Take 1 tablet by mouth every 6 (six) hours as needed for moderate pain (pain score 4-6) or severe pain (pain score 7-10)., Disp: 120 tablet, Rfl: 0   pregabalin  (LYRICA ) 300 MG capsule, Take 1 capsule (300 mg total) by mouth daily., Disp: 30 capsule, Rfl: 2   rosuvastatin  (CRESTOR ) 10 MG tablet, TAKE 1 TABLET BY  MOUTH EVERY DAY, Disp: 90 tablet, Rfl: 3   Semaglutide -Weight Management (WEGOVY ) 0.5 MG/0.5ML SOAJ, Inject 0.5 mg into the skin once a week., Disp: 6 mL, Rfl: 0   vardenafil  (LEVITRA ) 10 MG tablet, Take 1 tablet (10 mg total) by mouth daily as needed for erectile dysfunction., Disp: 10 tablet, Rfl: 5   Past Medical History:  Diagnosis Date   Alcohol abuse    a. up to a 12 pack of beer/day. (hx of, not currently) 04/10/23   Arthritis    CAD in native artery    a. LHC 6/18: severe 1 vessel CAD with 90% thrombotic stenosis in the  proximal LAD which was felt to be the culprit for the patient's non-STEMI. He underwent successful PCI/DES with a resolute onyx drug-eluting stent (3.0 x 18 mm). There was 0% residual stenosis. LVEF estimated at 55-65% by visual estimate. LVEDP was mildly elevated   Centrilobular emphysema (HCC)    Dyspnea    ED (erectile dysfunction)    GERD (gastroesophageal reflux disease)    H/O echocardiogram    a. echo 6/18: EF 60-65%, normal wall motion, grade 1 diastolic dysfunction, RV cavity size was normal with normal wall thickness and normal RV systolic function. There were no significant valvular abnormalities   Hearing loss    HLD (hyperlipidemia)    Hypertension    Marijuana abuse    hx of. Not currently (04/10/23)   NSTEMI (non-ST elevated myocardial infarction) (HCC)    a. LHC 6/18: severe 1 vessel CAD with 90% thrombotic stenosis in the proximal LAD which was felt to be the culprit for the patient's non-STEMI. He underwent successful PCI/DES with a resolute onyx drug-eluting stent (3.0 x 18 mm). There was 0% residual stenosis. LVEF estimated at 55-65% by visual estimate. LVEDP was mildly elevated   Senile purpura (HCC)    Spinal stenosis    Tobacco abuse    Assessment and Plan:  1. Chronic pain syndrome   2. Chronic, continuous use of opioids   3. Post laminectomy syndrome   4. Sciatica of right side   5. Facet arthritis of lumbar region   6. Low back pain at multiple sites    Based on our conversation today I think it is appropriate to increase his dosing to 10 mg tablets 3 times a day on the Percocet.  I have encouraged him to add in Naprosyn  over-the-counter 2 tablets once a day in addition to 1 additional dose of Tylenol  500 mg 2 tablets once a day with his current regimen.  Continue with core stretching strengthening.  We may offer physical therapy on his next visit depending on his response to today's changes however he states that he is doing some exercises at present.  I do not  feel he is a candidate for any other interventional therapy though at some point if the pain persist he may be a candidate for a dorsal column stimulator.  Will schedule him for return to clinic in 1 month.  I have reviewed the [place]  practitioner database information is appropriate for these refills.  This to be dated for September 14 with return to clinic as mentioned continue follow-up with his primary care physicians. Follow Up Instructions:    I discussed the assessment and treatment plan with the patient. The patient was provided an opportunity to ask questions and all were answered. The patient agreed with the plan and demonstrated an understanding of the instructions.   The patient was advised to call back  or seek an in-person evaluation if the symptoms worsen or if the condition fails to improve as anticipated.  I provided 30 minutes of non-face-to-face time during this encounter.   Lynwood KANDICE Clause, MD

## 2024-04-28 ENCOUNTER — Other Ambulatory Visit: Payer: Self-pay

## 2024-05-06 ENCOUNTER — Other Ambulatory Visit: Payer: Self-pay | Admitting: Internal Medicine

## 2024-05-07 ENCOUNTER — Ambulatory Visit: Admitting: Internal Medicine

## 2024-05-07 ENCOUNTER — Encounter: Payer: Self-pay | Admitting: Internal Medicine

## 2024-05-07 VITALS — BP 124/74 | Ht 74.0 in | Wt 232.4 lb

## 2024-05-07 DIAGNOSIS — I2510 Atherosclerotic heart disease of native coronary artery without angina pectoris: Secondary | ICD-10-CM | POA: Diagnosis not present

## 2024-05-07 DIAGNOSIS — I252 Old myocardial infarction: Secondary | ICD-10-CM | POA: Diagnosis not present

## 2024-05-07 DIAGNOSIS — R600 Localized edema: Secondary | ICD-10-CM | POA: Diagnosis not present

## 2024-05-07 DIAGNOSIS — N522 Drug-induced erectile dysfunction: Secondary | ICD-10-CM

## 2024-05-07 DIAGNOSIS — E663 Overweight: Secondary | ICD-10-CM

## 2024-05-07 DIAGNOSIS — I700 Atherosclerosis of aorta: Secondary | ICD-10-CM

## 2024-05-07 DIAGNOSIS — E782 Mixed hyperlipidemia: Secondary | ICD-10-CM | POA: Diagnosis not present

## 2024-05-07 DIAGNOSIS — K219 Gastro-esophageal reflux disease without esophagitis: Secondary | ICD-10-CM

## 2024-05-07 DIAGNOSIS — R7303 Prediabetes: Secondary | ICD-10-CM

## 2024-05-07 DIAGNOSIS — G8929 Other chronic pain: Secondary | ICD-10-CM

## 2024-05-07 DIAGNOSIS — I10 Essential (primary) hypertension: Secondary | ICD-10-CM

## 2024-05-07 DIAGNOSIS — M5441 Lumbago with sciatica, right side: Secondary | ICD-10-CM

## 2024-05-07 DIAGNOSIS — F1120 Opioid dependence, uncomplicated: Secondary | ICD-10-CM

## 2024-05-07 DIAGNOSIS — J432 Centrilobular emphysema: Secondary | ICD-10-CM | POA: Diagnosis not present

## 2024-05-07 DIAGNOSIS — G894 Chronic pain syndrome: Secondary | ICD-10-CM

## 2024-05-07 MED ORDER — OMEPRAZOLE 40 MG PO CPDR: 40.0000 mg | DELAYED_RELEASE_CAPSULE | Freq: Every day | ORAL | 1 refills | Status: DC

## 2024-05-07 MED ORDER — ALBUTEROL SULFATE HFA 108 (90 BASE) MCG/ACT IN AERS: 1.0000 | INHALATION_SPRAY | Freq: Four times a day (QID) | RESPIRATORY_TRACT | 2 refills | Status: DC | PRN

## 2024-05-07 MED ORDER — SEMAGLUTIDE-WEIGHT MANAGEMENT 1 MG/0.5ML ~~LOC~~ SOAJ: 1.0000 mg | SUBCUTANEOUS | 0 refills | Status: DC

## 2024-05-07 NOTE — Assessment & Plan Note (Signed)
 Encouraged regular stretching and core strengthening Continue Percocet 10-3 25 every 8 hours as needed, pregabalin  300 mg daily, cyclobenzaprine  10 mg 3 times daily as needed He will continue to follow with pain management

## 2024-05-07 NOTE — Assessment & Plan Note (Signed)
 Avoid foods that trigger reflux Encourage weight loss as this can help reduce reflux symptoms Continue omeprazole 40 mg daily

## 2024-05-07 NOTE — Telephone Encounter (Signed)
 Requested by interface [email or web address]. Receipt confirmed by pharmacy 05/07/24 at 8:35 am. Duplicate request.  Requested Prescriptions  Refused Prescriptions Disp Refills   omeprazole  (PRILOSEC) 40 MG capsule [Pharmacy Med Name: OMEPRAZOLE  DR 40 MG CAPSULE] 90 capsule 0    Sig: TAKE 1 CAPSULE (40 MG TOTAL) BY MOUTH DAILY.     Gastroenterology: Proton Pump Inhibitors Passed - 05/07/2024  4:26 PM      Passed - Valid encounter within last 12 months    Recent Outpatient Visits           Today Mixed hyperlipidemia   [place] [facility] [place], Angeline ORN, NP   6 months ago Encounter for general adult medical examination with abnormal findings   [place] [facility] [place], Angeline ORN, NP

## 2024-05-07 NOTE — Patient Instructions (Signed)

## 2024-05-07 NOTE — Assessment & Plan Note (Signed)
Encourage low-carb diet and exercise for weight loss 

## 2024-05-07 NOTE — Assessment & Plan Note (Signed)
 C-Met and lipid profile today Encouraged him to consume a low-fat diet Continue rosuvastatin  10 mg and aspirin  81 mg daily

## 2024-05-07 NOTE — Assessment & Plan Note (Signed)
 Encourage smoking cessation Continue breztri  160 - 9 - 4 0.8 mcg per actuation and albuterol  108 mcg per actuation

## 2024-05-07 NOTE — Assessment & Plan Note (Signed)
 No angina C-Met and lipid profile today Encouraged him to consume a low-fat diet Continue rosuvastatin  10 mg daily carvedilol  6.25 mg twice daily and aspirin  81 mg daily

## 2024-05-07 NOTE — Assessment & Plan Note (Signed)
 Continue vardenafil  10 mg daily as needed

## 2024-05-07 NOTE — Assessment & Plan Note (Signed)
 Controlled on amlodipine  5 mg daily, carvedilol  6.25 mg daily and lisinopril  40 mg daily Reinforced DASH diet and exercise for weight loss C-Met today

## 2024-05-07 NOTE — Assessment & Plan Note (Signed)
 C-Met and lipid profile today Encouraged him to consume a low-fat diet Continue rosuvastatin  10 mg daily

## 2024-05-07 NOTE — Progress Notes (Signed)
 Subjective:    Patient ID: Bryan JULIANNA Renna Mickey., male    DOB: May 29, 1956, 68 y.o.   MRN: [id number]  HPI  Patient presents to clinic today for 23-month follow-up of chronic conditions.  HTN: His BP today is 124/74 . He is taking amlodipine , carvedilol  and lisinopril  as prescribed.  ECG from 01/2024 reviewed.  HLD with CAD status post MI: His last LDL was 41, triglycerides 86, 10/2023.  He denies myalgias on rosuvastatin .  He is taking wegovy , aspirin , amlodipine , carvedilol  and lisinopril  as prescribed.  He follows with cardiology.  COPD: He denies chronic cough but has chronic shortness of breath.  He is breztri  and albuterol  as prescribed.  There are no PFTs on file.  He does continue to smoke.  He does not follow with pulmonology.  GERD: Triggered by spicy foods.  He denies breakthrough on omeprazole .  There is no upper GI on file.  ED: Managed with vardenafil .  He does not follow with urology.  Chronic back pain:  He is wearing a back brace. Managed with oxycodone , pregabalin , cyclobenzarine as prescribed by pain management.  MRI lumbar spine from 03/2023 reviewed.  Prediabetes: His last A1c was 5.8%, 10/2023.  He is not taking any oral diabetic medication at this time.  He does not check his sugars.  Review of Systems     Past Medical History:  Diagnosis Date   Alcohol abuse    a. up to a 12 pack of beer/day. (hx of, not currently) 04/10/23   Arthritis    CAD in native artery    a. LHC 6/18: severe 1 vessel CAD with 90% thrombotic stenosis in the proximal LAD which was felt to be the culprit for the patient's non-STEMI. He underwent successful PCI/DES with a resolute onyx drug-eluting stent (3.0 x 18 mm). There was 0% residual stenosis. LVEF estimated at 55-65% by visual estimate. LVEDP was mildly elevated   Centrilobular emphysema (HCC)    Dyspnea    ED (erectile dysfunction)    GERD (gastroesophageal reflux disease)    H/O echocardiogram    a. echo 6/18: EF 60-65%, normal wall  motion, grade 1 diastolic dysfunction, RV cavity size was normal with normal wall thickness and normal RV systolic function. There were no significant valvular abnormalities   Hearing loss    HLD (hyperlipidemia)    Hypertension    Marijuana abuse    hx of. Not currently (04/10/23)   NSTEMI (non-ST elevated myocardial infarction) (HCC)    a. LHC 6/18: severe 1 vessel CAD with 90% thrombotic stenosis in the proximal LAD which was felt to be the culprit for the patient's non-STEMI. He underwent successful PCI/DES with a resolute onyx drug-eluting stent (3.0 x 18 mm). There was 0% residual stenosis. LVEF estimated at 55-65% by visual estimate. LVEDP was mildly elevated   Senile purpura    Spinal stenosis    Tobacco abuse     Current Outpatient Medications  Medication Sig Dispense Refill   albuterol  (VENTOLIN  HFA) 108 (90 Base) MCG/ACT inhaler USE 1 TO 2 INHALATIONS BY MOUTH  EVERY 4 HOURS AS NEEDED FOR  WHEEZING OR SHORTNESS OF BREATH 54 g 3   amLODipine  (NORVASC ) 5 MG tablet Take 1 tablet (5 mg total) by mouth daily. 90 tablet 3   aspirin  EC 81 MG tablet Take 1 tablet (81 mg total) by mouth 3 (three) times a week. On Monday, Wednesday, Friday (Swallow whole).     BREZTRI  AEROSPHERE 160-9-4.8 MCG/ACT AERO INHALE 2 PUFFS INTO  THE LUNGS TWICE A DAY 32.1 each 2   carvedilol  (COREG ) 6.25 MG tablet Take 1 tablet (6.25 mg total) by mouth 2 (two) times daily with a meal. 180 tablet 3   lisinopril  (ZESTRIL ) 40 MG tablet Take 1 tablet (40 mg total) by mouth daily. 90 tablet 3   Melatonin 10 MG TABS Take 10 mg by mouth at bedtime.     montelukast  (SINGULAIR ) 10 MG tablet TAKE 1 TABLET BY MOUTH EVERYDAY AT BEDTIME 90 tablet 0   omeprazole  (PRILOSEC) 40 MG capsule TAKE 1 CAPSULE (40 MG TOTAL) BY MOUTH DAILY. 90 capsule 0   oxyCODONE -acetaminophen  (PERCOCET) 10-325 MG tablet Take 1 tablet by mouth every 8 (eight) hours as needed for pain. 90 tablet 0   pregabalin  (LYRICA ) 300 MG capsule Take 1 capsule (300  mg total) by mouth daily. 30 capsule 2   rosuvastatin  (CRESTOR ) 10 MG tablet TAKE 1 TABLET BY MOUTH EVERY DAY 90 tablet 3   semaglutide -weight management (WEGOVY ) 0.5 MG/0.5ML SOAJ SQ injection Inject 0.5 mg into the skin once a week. 6 mL 0   vardenafil  (LEVITRA ) 10 MG tablet Take 1 tablet (10 mg total) by mouth daily as needed for erectile dysfunction. 10 tablet 5   No current facility-administered medications for this visit.    No Known Allergies  Family History  Problem Relation Age of Onset   Osteoporosis Mother    CAD Father        s/p CABG in his 5's or 61's.   Diabetes Father    Colon polyps Brother    CAD Paternal Grandfather     Social History   Socioeconomic History   Marital status: Divorced    Spouse name: Not on file   Number of children: 1   Years of education: Not on file   Highest education level: 9th grade  Occupational History   Not on file  Tobacco Use   Smoking status: Every Day    Current packs/day: 1.00    Average packs/day: 1 pack/day for 42.0 years (42.0 ttl pk-yrs)    Types: Cigarettes   Smokeless tobacco: Never   Tobacco comments:    About 8 cigarettes per day (04/10/23)  Vaping Use   Vaping status: Never Used  Substance and Sexual Activity   Alcohol use: Not Currently   Drug use: Not Currently   Sexual activity: Yes  Other Topics Concern   Not on file  Social History Narrative   Lives in [place].  He lives in a mobile home in the backyard of his girlfriend.  He works in Aeronautical engineer.   Social Drivers of Health   Financial Resource Strain: Medium Risk (05/02/2024)   Overall Financial Resource Strain (CARDIA)    Difficulty of Paying Living Expenses: Somewhat hard  Food Insecurity: Food Insecurity Present (05/02/2024)   Hunger Vital Sign    Worried About Running Out of Food in the Last Year: Sometimes true    Ran Out of Food in the Last Year: Never true  Transportation Needs: No Transportation Needs (05/02/2024)   PRAPARE - Therapist, art (Medical): No    Lack of Transportation (Non-Medical): No  Physical Activity: Inactive (05/02/2024)   Exercise Vital Sign    Days of Exercise per Week: 0 days    Minutes of Exercise per Session: Not on file  Stress: No Stress Concern Present (05/02/2024)   [employer] of Occupational Health - Occupational Stress Questionnaire    Feeling of Stress: Only a  little  Social Connections: Socially Integrated (05/02/2024)   Social Connection and Isolation Panel    Frequency of Communication with Friends and Family: More than three times a week    Frequency of Social Gatherings with Friends and Family: Twice a week    Attends Religious Services: More than 4 times per year    Active Member of [employer] or Organizations: Yes    Attends Banker Meetings: More than 4 times per year    Marital Status: Living with partner  Intimate Partner Violence: Patient Declined (06/22/2023)   Humiliation, Afraid, Rape, and Kick questionnaire    Fear of Current or Ex-Partner: Patient declined    Emotionally Abused: Patient declined    Physically Abused: Patient declined    Sexually Abused: Patient declined     Constitutional:  Denies fever, malaise, fatigue, headache or abnormal weight gain.  HEENT: Denies eye pain, eye redness, ear pain, ringing in the ears, wax buildup, runny nose, nasal congestion, bloody nose, or sore throat. Respiratory: Patient reports shortness of breath.  Denies difficulty breathing, cough or sputum production.   Cardiovascular: Patient reports swelling in legs.  Denies chest pain, chest tightness, palpitations or swelling in the hands.  Gastrointestinal: Denies abdominal pain, bloating, constipation, diarrhea or blood in the stool.  GU: Denies urgency, frequency, pain with urination, burning sensation, blood in urine, odor or discharge. Musculoskeletal: Patient reports chronic back pain.  Denies decrease in range of motion, difficulty with gait, or  joint swelling.  Skin: Denies redness, rashes, lesions or ulcercations.  Neurological: Pt reports numbness in legs, difficulty with gait. Denies dizziness, difficulty with memory, difficulty with speech or problems with balance and coordination.  Psych: Denies anxiety, depression, SI/HI.  No other specific complaints in a complete review of systems (except as listed in HPI above).  Objective:   Physical Exam  BP 124/74 (BP Location: Left Arm, Patient Position: Sitting, Cuff Size: Normal)   Ht 6' 2 (1.88 m)   Wt 232 lb 6.4 oz (105.4 kg)   BMI 29.84 kg/m    Wt Readings from Last 3 Encounters:  02/27/24 241 lb 9.6 oz (109.6 kg)  11/06/23 248 lb 6.4 oz (112.7 kg)  10/15/23 252 lb (114.3 kg)    General: Appears his stated age, obese, in NAD. Skin: Warm, dry and intact.  HEENT: Head: normal shape and size; Eyes: sclera white, no icterus, conjunctiva pink, PERRLA and EOMs intact;  Cardiovascular: Normal rate and rhythm. S1,S2 noted.  No murmur, rubs or gallops noted. No JVD.  Trace BLE edema. No carotid bruits noted. Pulmonary/Chest: Normal effort and positive vesicular breath sounds. No respiratory distress. No wheezes, rales or ronchi noted.  Abdomen: Soft and nontender. Normal bowel sounds.  Musculoskeletal: He has difficulty getting from a sitting to a standing position.  Gait slow and steady with the use of a cane.  Neurological: Alert and oriented. Coordination normal.  Psychiatric: Mood and affect normal. Behavior is normal. Judgment and thought content normal.    BMET    Component Value Date/Time   NA 144 02/27/2024 1142   K 4.1 02/27/2024 1142   CL 106 02/27/2024 1142   CO2 19 (L) 02/27/2024 1142   GLUCOSE 120 (H) 02/27/2024 1142   GLUCOSE 80 11/06/2023 0836   BUN 14 02/27/2024 1142   CREATININE 0.82 02/27/2024 1142   CREATININE 0.90 11/06/2023 0836   CALCIUM  9.2 02/27/2024 1142   GFRNONAA >60 04/10/2023 0929   GFRNONAA 69 01/30/2020 1115   GFRAA 80 01/30/2020  1115     Lipid Panel     Component Value Date/Time   CHOL 112 11/06/2023 0836   TRIG 86 11/06/2023 0836   HDL 54 11/06/2023 0836   CHOLHDL 2.1 11/06/2023 0836   VLDL 12 06/24/2018 1725   LDLCALC 41 11/06/2023 0836    CBC    Component Value Date/Time   WBC 8.8 11/06/2023 0836   RBC 5.06 11/06/2023 0836   HGB 15.4 11/06/2023 0836   HGB 16.7 10/04/2021 1459   HCT 46.2 11/06/2023 0836   HCT 49.2 10/04/2021 1459   PLT 237 11/06/2023 0836   PLT 272 10/04/2021 1459   MCV 91.3 11/06/2023 0836   MCV 92 10/04/2021 1459   MCH 30.4 11/06/2023 0836   MCHC 33.3 11/06/2023 0836   RDW 13.5 11/06/2023 0836   RDW 13.1 10/04/2021 1459   LYMPHSABS 2.0 10/04/2021 1459   MONOABS 0.5 08/02/2019 0703   EOSABS 0.1 10/04/2021 1459   BASOSABS 0.1 10/04/2021 1459    Hgb A1C Lab Results  Component Value Date   HGBA1C 5.8 (H) 11/06/2023           Assessment & Plan:      RTC in 6 months for your annual exam Angeline Laura, NP

## 2024-05-07 NOTE — Assessment & Plan Note (Signed)
A1c today Encouraged low carb diet and exercise for weight loss 

## 2024-05-07 NOTE — Assessment & Plan Note (Signed)
 C-Met and lipid profile today Encouraged him to consume a low-fat diet Continue rosuvastatin  10 mg daily, aspirin  81 mg daily and carvedilol  6.25 mg twice daily

## 2024-05-08 ENCOUNTER — Ambulatory Visit: Payer: Self-pay | Admitting: Internal Medicine

## 2024-05-08 ENCOUNTER — Ambulatory Visit: Attending: Anesthesiology | Admitting: Anesthesiology

## 2024-05-08 ENCOUNTER — Encounter: Payer: Self-pay | Admitting: Anesthesiology

## 2024-05-08 DIAGNOSIS — G894 Chronic pain syndrome: Secondary | ICD-10-CM

## 2024-05-08 DIAGNOSIS — M5431 Sciatica, right side: Secondary | ICD-10-CM | POA: Diagnosis not present

## 2024-05-08 DIAGNOSIS — F1190 Opioid use, unspecified, uncomplicated: Secondary | ICD-10-CM | POA: Diagnosis not present

## 2024-05-08 DIAGNOSIS — M47816 Spondylosis without myelopathy or radiculopathy, lumbar region: Secondary | ICD-10-CM

## 2024-05-08 DIAGNOSIS — M545 Low back pain, unspecified: Secondary | ICD-10-CM | POA: Diagnosis not present

## 2024-05-08 DIAGNOSIS — M961 Postlaminectomy syndrome, not elsewhere classified: Secondary | ICD-10-CM | POA: Diagnosis not present

## 2024-05-08 LAB — LIPID PANEL
Cholesterol: 93 mg/dL (ref <200)
HDL: 46 mg/dL (ref >=40)
LDL Cholesterol (Calc): 33 mg/dL
Non-HDL Cholesterol (Calc): 47 mg/dL (ref <130)
Total CHOL/HDL Ratio: 2 (calc) (ref <5.0)
Triglycerides: 68 mg/dL (ref <150)

## 2024-05-08 LAB — COMPREHENSIVE METABOLIC PANEL WITH GFR
AG Ratio: 1.9 (calc) (ref 1.0–2.5)
ALT: 13 U/L (ref 9–46)
AST: 12 U/L (ref 10–35)
Albumin: 4.2 g/dL (ref 3.6–5.1)
Alkaline phosphatase (APISO): 72 U/L (ref 35–144)
BUN: 11 mg/dL (ref 7–25)
CO2: 27 mmol/L (ref 20–32)
Calcium: 9.4 mg/dL (ref 8.6–10.3)
Chloride: 107 mmol/L (ref 98–110)
Creat: 0.85 mg/dL (ref 0.70–1.35)
Globulin: 2.2 g/dL (ref 1.9–3.7)
Glucose, Bld: 91 mg/dL (ref 65–99)
Potassium: 4.3 mmol/L (ref 3.5–5.3)
Sodium: 140 mmol/L (ref 135–146)
Total Bilirubin: 0.6 mg/dL (ref 0.2–1.2)
Total Protein: 6.4 g/dL (ref 6.1–8.1)
eGFR: 95 mL/min/1.73m2 (ref >=60)

## 2024-05-08 LAB — HEMOGLOBIN A1C
Hgb A1c MFr Bld: 5.3 % (ref <5.7)
Mean Plasma Glucose: 105 mg/dL
eAG (mmol/L): 5.8 mmol/L

## 2024-05-08 LAB — CBC
HCT: 46.2 % (ref 38.5–50.0)
Hemoglobin: 15.2 g/dL (ref 13.2–17.1)
MCH: 31.4 pg (ref 27.0–33.0)
MCHC: 32.9 g/dL (ref 32.0–36.0)
MCV: 95.5 fL (ref 80.0–100.0)
MPV: 9.3 fL (ref 7.5–12.5)
Platelets: 238 Thousand/uL (ref 140–400)
RBC: 4.84 Million/uL (ref 4.20–5.80)
RDW: 13.1 % (ref 11.0–15.0)
WBC: 6 Thousand/uL (ref 3.8–10.8)

## 2024-05-08 MED ORDER — OXYCODONE-ACETAMINOPHEN 10-325 MG PO TABS: 1.0000 | ORAL_TABLET | Freq: Four times a day (QID) | ORAL | 0 refills | Status: AC | PRN

## 2024-05-08 MED ORDER — OXYCODONE-ACETAMINOPHEN 10-325 MG PO TABS: 1.0000 | ORAL_TABLET | Freq: Four times a day (QID) | ORAL | 0 refills | Status: DC | PRN

## 2024-05-09 NOTE — Progress Notes (Signed)
 Virtual Visit via Telephone Note  I connected with Bryan Ibarra. on 05/09/24 at 10:20 AM EDT by telephone and verified that I am speaking with the correct person using two identifiers.  Location: Patient: Home Provider: Pain control center   I discussed the limitations, risks, security and privacy concerns of performing an evaluation and management service by telephone and the availability of in person appointments. I also discussed with the patient that there may be a patient responsible charge related to this service. The patient expressed understanding and agreed to proceed.   History of Present Illness: I spoke with Bryan Ibarra via telephone as we could not link for the video portion of the conference.  He reports that he has been doing well with his recent change in medication.  He is having much better coverage at the Percocet 10 mg dose than before.  He is taking these about every 4-6 hours averaging about 3 and sometimes 4 times a day.  He had a recent exacerbation that was much more effectively controlled with this medication management he reports.  No other change in the quality characteristic or distribution of the pain is noted other than the intensity recently.  No change in lower extremity strength function bowel or bladder function is noted.  He is doing well with this regimen and very pleased with his response to the change in therapy.  Review of systems: General: No fevers or chills Pulmonary: No shortness of breath or dyspnea Cardiac: No angina or palpitations or lightheadedness GI: No abdominal pain or constipation Psych: No depression    Observations/Objective:  Current Outpatient Medications:    [START ON 06/12/2024] oxyCODONE -acetaminophen  (PERCOCET) 10-325 MG tablet, Take 1 tablet by mouth every 6 (six) hours as needed for pain., Disp: 100 tablet, Rfl: 0   albuterol  (VENTOLIN  HFA) 108 (90 Base) MCG/ACT inhaler, Inhale 1-2 puffs into the lungs every 6 (six)  hours as needed., Disp: 18 g, Rfl: 2   amLODipine  (NORVASC ) 5 MG tablet, Take 1 tablet (5 mg total) by mouth daily., Disp: 90 tablet, Rfl: 3   aspirin  EC 81 MG tablet, Take 1 tablet (81 mg total) by mouth 3 (three) times a week. On Monday, Wednesday, Friday (Swallow whole)., Disp: , Rfl:    BREZTRI  AEROSPHERE 160-9-4.8 MCG/ACT AERO, INHALE 2 PUFFS INTO THE LUNGS TWICE A DAY (Patient taking differently: as needed.), Disp: 32.1 each, Rfl: 2   carvedilol  (COREG ) 6.25 MG tablet, Take 1 tablet (6.25 mg total) by mouth 2 (two) times daily with a meal., Disp: 180 tablet, Rfl: 3   cyclobenzaprine  (FLEXERIL ) 10 MG tablet, Take 10 mg by mouth at bedtime., Disp: , Rfl:    lisinopril  (ZESTRIL ) 40 MG tablet, Take 1 tablet (40 mg total) by mouth daily., Disp: 90 tablet, Rfl: 3   Melatonin 10 MG TABS, Take 10 mg by mouth at bedtime., Disp: , Rfl:    montelukast  (SINGULAIR ) 10 MG tablet, TAKE 1 TABLET BY MOUTH EVERYDAY AT BEDTIME, Disp: 90 tablet, Rfl: 0   omeprazole  (PRILOSEC) 40 MG capsule, Take 1 capsule (40 mg total) by mouth daily., Disp: 90 capsule, Rfl: 1   [START ON 05/13/2024] oxyCODONE -acetaminophen  (PERCOCET) 10-325 MG tablet, Take 1 tablet by mouth every 6 (six) hours as needed for pain., Disp: 100 tablet, Rfl: 0   pregabalin  (LYRICA ) 300 MG capsule, Take 1 capsule (300 mg total) by mouth daily., Disp: 30 capsule, Rfl: 2   rosuvastatin  (CRESTOR ) 10 MG tablet, TAKE 1 TABLET BY MOUTH EVERY DAY,  Disp: 90 tablet, Rfl: 3   semaglutide -weight management (WEGOVY ) 1 MG/0.5ML SOAJ SQ injection, Inject 1 mg into the skin once a week., Disp: 6 mL, Rfl: 0   vardenafil  (LEVITRA ) 10 MG tablet, Take 1 tablet (10 mg total) by mouth daily as needed for erectile dysfunction., Disp: 10 tablet, Rfl: 5   Past Medical History:  Diagnosis Date   Alcohol abuse    a. up to a 12 pack of beer/day. (hx of, not currently) 04/10/23   Arthritis    CAD in native artery    a. LHC 6/18: severe 1 vessel CAD with 90% thrombotic  stenosis in the proximal LAD which was felt to be the culprit for the patient's non-STEMI. He underwent successful PCI/DES with a resolute onyx drug-eluting stent (3.0 x 18 mm). There was 0% residual stenosis. LVEF estimated at 55-65% by visual estimate. LVEDP was mildly elevated   Centrilobular emphysema (HCC)    Dyspnea    ED (erectile dysfunction)    GERD (gastroesophageal reflux disease)    H/O echocardiogram    a. echo 6/18: EF 60-65%, normal wall motion, grade 1 diastolic dysfunction, RV cavity size was normal with normal wall thickness and normal RV systolic function. There were no significant valvular abnormalities   Hearing loss    HLD (hyperlipidemia)    Hypertension    Marijuana abuse    hx of. Not currently (04/10/23)   NSTEMI (non-ST elevated myocardial infarction) (HCC)    a. LHC 6/18: severe 1 vessel CAD with 90% thrombotic stenosis in the proximal LAD which was felt to be the culprit for the patient's non-STEMI. He underwent successful PCI/DES with a resolute onyx drug-eluting stent (3.0 x 18 mm). There was 0% residual stenosis. LVEF estimated at 55-65% by visual estimate. LVEDP was mildly elevated   Senile purpura    Spinal stenosis    Tobacco abuse    Assessment and Plan: 1. Chronic pain syndrome   2. Chronic, continuous use of opioids   3. Post laminectomy syndrome   4. Sciatica of right side   5. Facet arthritis of lumbar region   6. Low back pain at multiple sites    Based on our conversation it is appropriate to refill his medicines for the next 2 months.  No other pharmacologic changes are initiated.  I encouraged him to continue his stretching strengthening exercises for his low back and neck.  I have reviewed the [place]  practitioner database information and it is appropriate.  He reports no side effects to the medication and has done well with chronic opioid management with good lifestyle functional improvements whereas he has failed more conservative  therapy.  Continue follow-up with his primary care physicians for baseline medical care with return to clinic in 2 months. Based on conversation  Follow Up Instructions:    I discussed the assessment and treatment plan with the patient. The patient was provided an opportunity to ask questions and all were answered. The patient agreed with the plan and demonstrated an understanding of the instructions.   The patient was advised to call back or seek an in-person evaluation if the symptoms worsen or if the condition fails to improve as anticipated.  I provided 30 minutes of non-face-to-face time during this encounter.   Lynwood KANDICE Clause, MD

## 2024-05-23 ENCOUNTER — Other Ambulatory Visit: Payer: Self-pay | Admitting: Internal Medicine

## 2024-05-24 NOTE — Telephone Encounter (Signed)
 Too soon for refill.  Requested Prescriptions  Pending Prescriptions Disp Refills   albuterol  (VENTOLIN  HFA) 108 (90 Base) MCG/ACT inhaler [Pharmacy Med Name: ALBUTEROL  HFA 90MCG/ACT (V)] 108 g 2    Sig: USE 1 TO 2 INHALATIONS BY MOUTH  EVERY 4 HOURS AS NEEDED FOR  WHEEZING OR SHORTNESS OF BREATH     Pulmonology:  Beta Agonists 2 Passed - 05/24/2024 10:46 AM      Passed - Last BP in normal range    BP Readings from Last 1 Encounters:  05/07/24 124/74         Passed - Last Heart Rate in normal range    Pulse Readings from Last 1 Encounters:  02/27/24 88         Passed - Valid encounter within last 12 months    Recent Outpatient Visits           2 weeks ago Mixed hyperlipidemia   [place] [facility] [place], Angeline ORN, NP   6 months ago Encounter for general adult medical examination with abnormal findings   [place] [facility] [place], Angeline ORN, NP

## 2024-06-14 ENCOUNTER — Other Ambulatory Visit: Payer: Self-pay | Admitting: Internal Medicine

## 2024-06-14 DIAGNOSIS — Z0000 Encounter for general adult medical examination without abnormal findings: Secondary | ICD-10-CM

## 2024-06-17 NOTE — Telephone Encounter (Signed)
 Requested Prescriptions  Pending Prescriptions Disp Refills   montelukast  (SINGULAIR ) 10 MG tablet [Pharmacy Med Name: MONTELUKAST  SOD 10 MG TABLET] 90 tablet 0    Sig: TAKE 1 TABLET BY MOUTH EVERYDAY AT BEDTIME     Pulmonology:  Leukotriene Inhibitors Passed - 06/17/2024 11:28 AM      Passed - Valid encounter within last 12 months    Recent Outpatient Visits           1 month ago Mixed hyperlipidemia   [place] [facility] [place], Angeline ORN, NP   7 months ago Encounter for general adult medical examination with abnormal findings    [facility] [place], Angeline ORN, NP

## 2024-06-23 ENCOUNTER — Telehealth: Payer: Self-pay | Admitting: Anesthesiology

## 2024-06-23 ENCOUNTER — Telehealth: Payer: Self-pay

## 2024-06-23 MED ORDER — PREDNISONE 10 MG (21) PO TBPK: ORAL_TABLET | Freq: Every day | ORAL | 0 refills | Status: AC

## 2024-06-23 NOTE — Telephone Encounter (Signed)
 Pt stated that he can not walk

## 2024-06-23 NOTE — Telephone Encounter (Signed)
 Dr Myra, please send in Prednisone  taper due to not being able to walk on right leg. Thanks

## 2024-06-23 NOTE — Addendum Note (Signed)
 Addended by: MYRA LYNWOOD MATSU on: 06/23/2024 12:26 PM   Modules accepted: Orders

## 2024-06-30 ENCOUNTER — Encounter: Payer: Self-pay | Admitting: Anesthesiology

## 2024-06-30 ENCOUNTER — Ambulatory Visit
Admission: RE | Admit: 2024-06-30 | Discharge: 2024-06-30 | Disposition: A | Discharge status: Home / Self Care | Source: Ambulatory Visit | Attending: Anesthesiology | Admitting: Anesthesiology

## 2024-06-30 ENCOUNTER — Other Ambulatory Visit: Payer: Self-pay | Admitting: Anesthesiology

## 2024-06-30 ENCOUNTER — Ambulatory Visit: Admitting: Anesthesiology

## 2024-06-30 VITALS — BP 162/107 | HR 74 | Temp 96.6°F | Resp 20 | Ht 74.0 in | Wt 235.0 lb

## 2024-06-30 DIAGNOSIS — M545 Low back pain, unspecified: Secondary | ICD-10-CM | POA: Diagnosis present

## 2024-06-30 DIAGNOSIS — R52 Pain, unspecified: Secondary | ICD-10-CM

## 2024-06-30 DIAGNOSIS — F1190 Opioid use, unspecified, uncomplicated: Secondary | ICD-10-CM | POA: Insufficient documentation

## 2024-06-30 DIAGNOSIS — M5431 Sciatica, right side: Secondary | ICD-10-CM

## 2024-06-30 DIAGNOSIS — M47816 Spondylosis without myelopathy or radiculopathy, lumbar region: Secondary | ICD-10-CM | POA: Diagnosis present

## 2024-06-30 DIAGNOSIS — M961 Postlaminectomy syndrome, not elsewhere classified: Secondary | ICD-10-CM | POA: Diagnosis present

## 2024-06-30 DIAGNOSIS — G894 Chronic pain syndrome: Secondary | ICD-10-CM | POA: Diagnosis present

## 2024-06-30 MED ORDER — SODIUM CHLORIDE 0.9% FLUSH
10.0000 mL | Freq: Once | INTRAVENOUS | Status: AC
  Administered 2024-06-30: 10 mL

## 2024-06-30 MED ORDER — TRIAMCINOLONE ACETONIDE 40 MG/ML IJ SUSP
40.0000 mg | Freq: Once | INTRAMUSCULAR | Status: AC
  Administered 2024-06-30: 40 mg
  Filled 2024-06-30: qty 1

## 2024-06-30 MED ORDER — DEXAMETHASONE SOD PHOSPHATE PF 10 MG/ML IJ SOLN
10.0000 mg | Freq: Once | INTRAMUSCULAR | Status: AC
  Administered 2024-06-30: 10 mg

## 2024-06-30 MED ORDER — OXYCODONE-ACETAMINOPHEN 10-325 MG PO TABS: 1.0000 | ORAL_TABLET | Freq: Four times a day (QID) | ORAL | 0 refills | Status: DC | PRN

## 2024-06-30 MED ORDER — LIDOCAINE HCL (PF) 1 % IJ SOLN
5.0000 mL | Freq: Once | INTRAMUSCULAR | Status: AC
  Administered 2024-06-30: 5 mL via SUBCUTANEOUS
  Filled 2024-06-30: qty 5

## 2024-06-30 MED ORDER — ROPIVACAINE HCL 2 MG/ML IJ SOLN
10.0000 mL | Freq: Once | INTRAMUSCULAR | Status: AC
  Administered 2024-06-30: 20 mL via EPIDURAL
  Filled 2024-06-30: qty 20

## 2024-06-30 MED ORDER — IOHEXOL 180 MG/ML  SOLN
10.0000 mL | Freq: Once | INTRAMUSCULAR | Status: AC | PRN
  Administered 2024-06-30: 10 mL via EPIDURAL
  Filled 2024-06-30: qty 20

## 2024-06-30 NOTE — Progress Notes (Unsigned)
 Safety precautions to be maintained throughout the outpatient stay will include: orient to surroundings, keep bed in low position, maintain call bell within reach at all times, provide assistance with transfer out of bed and ambulation.

## 2024-06-30 NOTE — Patient Instructions (Signed)

## 2024-07-02 ENCOUNTER — Encounter: Payer: Self-pay | Admitting: Anesthesiology

## 2024-07-02 NOTE — Progress Notes (Signed)
 Subjective:  Patient ID: Bryan JULIANNA Renna Mickey., male    DOB: [birth year]  Age: 68 y.o. MRN: [id number]  CC: Back Pain   Procedure: L5-S1 epidural steroid and fluoroscopic guidance with no sedation  HPI Bryan Ibarra. presents for reevaluation complaining of right lower extremity severe sciatica pain.  This has been present for about the last 3 to 4 weeks and has gradually intensified in severity.  Has had this pain in the past with sciatica symptoms.  Its gotten to the point where he is essentially incapacitated and is not sleeping.  He is gone through conservative measures including heat and ice application gradual stretching as well as anti-inflammatories and his opioid pain medications.  No matter what he does he cannot get relief and he has had previous epidural steroid injections back in March with good success for this similar pain.  This is a recurrence of the same pain.  No other changes are noted no change in lower extremity strength function bowel or bladder function is noted.  Otherwise is having difficulty with sleep at night and ambulation throughout the house or trying to do his physical therapy exercises for his core strengthening.  He desires to proceed with an epidural injection today.  In the past his pain was up at a VAS score of 8 or 9 and down to a 1 or 2 following the injection.  Outpatient Medications Prior to Visit  Medication Sig Dispense Refill   albuterol  (VENTOLIN  HFA) 108 (90 Base) MCG/ACT inhaler Inhale 1-2 puffs into the lungs every 6 (six) hours as needed. 18 g 2   amLODipine  (NORVASC ) 5 MG tablet Take 1 tablet (5 mg total) by mouth daily. 90 tablet 3   aspirin  EC 81 MG tablet Take 1 tablet (81 mg total) by mouth 3 (three) times a week. On Monday, Wednesday, Friday (Swallow whole).     BREZTRI  AEROSPHERE 160-9-4.8 MCG/ACT AERO INHALE 2 PUFFS INTO THE LUNGS TWICE A DAY (Patient taking differently: as needed.) 32.1 each 2   carvedilol  (COREG ) 6.25 MG tablet Take 1  tablet (6.25 mg total) by mouth 2 (two) times daily with a meal. 180 tablet 3   cyclobenzaprine  (FLEXERIL ) 10 MG tablet Take 10 mg by mouth at bedtime.     lisinopril  (ZESTRIL ) 40 MG tablet Take 1 tablet (40 mg total) by mouth daily. 90 tablet 3   Melatonin 10 MG TABS Take 10 mg by mouth at bedtime.     montelukast  (SINGULAIR ) 10 MG tablet TAKE 1 TABLET BY MOUTH EVERYDAY AT BEDTIME 90 tablet 0   omeprazole  (PRILOSEC) 40 MG capsule Take 1 capsule (40 mg total) by mouth daily. 90 capsule 1   predniSONE  (STERAPRED UNI-PAK 21 TAB) 10 MG (21) TBPK tablet Take by mouth daily for 12 days. 6 tablets day one and two, five tablets day 3 and day 4,  and  taper by one tablet every other day  over 12 days as directed 42 tablet 0   pregabalin  (LYRICA ) 300 MG capsule Take 1 capsule (300 mg total) by mouth daily. 30 capsule 2   rosuvastatin  (CRESTOR ) 10 MG tablet TAKE 1 TABLET BY MOUTH EVERY DAY 90 tablet 3   semaglutide -weight management (WEGOVY ) 1 MG/0.5ML SOAJ SQ injection Inject 1 mg into the skin once a week. 6 mL 0   vardenafil  (LEVITRA ) 10 MG tablet Take 1 tablet (10 mg total) by mouth daily as needed for erectile dysfunction. 10 tablet 5   oxyCODONE -acetaminophen  (PERCOCET) 10-325 MG tablet  Take 1 tablet by mouth every 6 (six) hours as needed for pain. 100 tablet 0   No facility-administered medications prior to visit.    Review of Systems CNS: No confusion or sedation Cardiac: No angina or palpitations GI: No abdominal pain or constipation Constitutional: No nausea vomiting fevers or chills  Objective:  BP (!) 162/107   Pulse 74   Temp (!) 96.6 F (35.9 C)   Resp 20   Ht 6' 2 (1.88 m)   Wt 235 lb (106.6 kg)   SpO2 99%   BMI 30.17 kg/m    BP Readings from Last 3 Encounters:  06/30/24 (!) 162/107  05/07/24 124/74  02/27/24 124/64     Wt Readings from Last 3 Encounters:  06/30/24 235 lb (106.6 kg)  05/07/24 232 lb 6.4 oz (105.4 kg)  02/27/24 241 lb 9.6 oz (109.6 kg)      Physical Exam Pt is alert and oriented PERRL EOMI HEART IS RRR no murmur or rub LCTA no wheezing or rales MUSCULOSKELETAL reveals a positive straight leg raise on the right side.  He has significant paraspinous muscle tenderness in the lumbar region on the right but no overt trigger points.  Negative on the left.  Muscle tone and bulk is at baseline.  Labs  Lab Results  Component Value Date   HGBA1C 5.3 05/07/2024   HGBA1C 5.8 (H) 11/06/2023   HGBA1C 5.5 11/28/2022   Lab Results  Component Value Date   LDLCALC 33 05/07/2024   CREATININE 0.85 05/07/2024    -------------------------------------------------------------------------------------------------------------------- Lab Results  Component Value Date   WBC 6.0 05/07/2024   HGB 15.2 05/07/2024   HCT 46.2 05/07/2024   PLT 238 05/07/2024   GLUCOSE 91 05/07/2024   CHOL 93 05/07/2024   TRIG 68 05/07/2024   HDL 46 05/07/2024   LDLDIRECT <4 06/24/2018   LDLCALC 33 05/07/2024   ALT 13 05/07/2024   AST 12 05/07/2024   NA 140 05/07/2024   K 4.3 05/07/2024   CL 107 05/07/2024   CREATININE 0.85 05/07/2024   BUN 11 05/07/2024   CO2 27 05/07/2024   TSH 0.89 01/30/2020   PSA 1.83 11/06/2023   INR 0.9 08/02/2019   HGBA1C 5.3 05/07/2024    --------------------------------------------------------------------------------------------------------------------- DG PAIN CLINIC C-ARM 1-60 MIN NO REPORT Result Date: 06/30/2024 Fluoro was used, but no Radiologist interpretation will be provided. Please refer to NOTES tab for provider progress note.    Assessment & Plan:   Bryan Ibarra was seen today for back pain.  Diagnoses and all orders for this visit:  Chronic pain syndrome -     ToxASSURE Select 13 (MW), Urine  Chronic, continuous use of opioids -     ToxASSURE Select 13 (MW), Urine  Post laminectomy syndrome  Sciatica of right side -     Lumbar Epidural Injection; Future  Facet arthritis of lumbar  region  Low back pain at multiple sites -     Lumbar Epidural Injection; Future  Other orders -     oxyCODONE -acetaminophen  (PERCOCET) 10-325 MG tablet; Take 1 tablet by mouth every 6 (six) hours as needed for pain. -     triamcinolone  acetonide (KENALOG -40) injection 40 mg -     sodium chloride  flush (NS) 0.9 % injection 10 mL -     ropivacaine  (PF) 2 mg/mL (0.2%) (NAROPIN ) injection 10 mL -     lidocaine  (PF) (XYLOCAINE ) 1 % injection 5 mL -     iohexol  (OMNIPAQUE ) 180 MG/ML injection 10 mL -  dexamethasone  (DECADRON ) injection 10 mg        ----------------------------------------------------------------------------------------------------------------------  Problem List Items Addressed This Visit       Unprioritized   Chronic pain syndrome - Primary   Relevant Medications   oxyCODONE -acetaminophen  (PERCOCET) 10-325 MG tablet (Start on 07/06/2024)   Other Relevant Orders   ToxASSURE Select 13 (MW), Urine   Other Visit Diagnoses       Chronic, continuous use of opioids       Relevant Orders   ToxASSURE Select 13 (MW), Urine     Post laminectomy syndrome         Sciatica of right side       Relevant Orders   Lumbar Epidural Injection     Facet arthritis of lumbar region       Relevant Medications   oxyCODONE -acetaminophen  (PERCOCET) 10-325 MG tablet (Start on 07/06/2024)   triamcinolone  acetonide (KENALOG -40) injection 40 mg (Completed)   dexamethasone  (DECADRON ) injection 10 mg (Completed)     Low back pain at multiple sites       Relevant Medications   oxyCODONE -acetaminophen  (PERCOCET) 10-325 MG tablet (Start on 07/06/2024)   triamcinolone  acetonide (KENALOG -40) injection 40 mg (Completed)   dexamethasone  (DECADRON ) injection 10 mg (Completed)   Other Relevant Orders   Lumbar Epidural Injection         ----------------------------------------------------------------------------------------------------------------------  1. Chronic pain syndrome  (Primary) Continue current medication management.  Refills will be generated for December 13 and January 12.  I have reviewed the [place]  practitioner database information.  It is appropriate for refill. - ToxASSURE Select 13 (MW), Urine  2. Chronic, continuous use of opioids As above as he continues to derive good functional benefit from the medications.  No side effects reported. - ToxASSURE Select 13 (MW), Urine  3. Post laminectomy syndrome As above  4. Sciatica of right side Will proceed with lumbar epidural steroid injection today to see if we can get him better pain relief as he has failed more conservative therapy and the medications are less effective now.  We gone over the risks and benefits of the procedure with him in full detail all questions were answered. - Lumbar Epidural Injection; Future  5. Facet arthritis of lumbar region As above and continue core strength exercises  6. Low back pain at multiple sites As above - Lumbar Epidural Injection; Future    ----------------------------------------------------------------------------------------------------------------------  I am having Bryan PHEBE Renna Mickey. Freddie maintain his Melatonin, vardenafil , Breztri  Aerosphere, aspirin  EC, carvedilol , lisinopril , rosuvastatin , amLODipine , pregabalin , cyclobenzaprine , omeprazole , albuterol , semaglutide -weight management, montelukast , predniSONE , and oxyCODONE -acetaminophen . We administered triamcinolone  acetonide, sodium chloride  flush, ropivacaine  (PF) 2 mg/mL (0.2%), lidocaine  (PF), iohexol , and dexamethasone .   Meds ordered this encounter  Medications   oxyCODONE -acetaminophen  (PERCOCET) 10-325 MG tablet    Sig: Take 1 tablet by mouth every 6 (six) hours as needed for pain.    Dispense:  120 tablet    Refill:  0    Ok for early refill. JA   triamcinolone  acetonide (KENALOG -40) injection 40 mg   sodium chloride  flush (NS) 0.9 % injection 10 mL   ropivacaine  (PF) 2  mg/mL (0.2%) (NAROPIN ) injection 10 mL   lidocaine  (PF) (XYLOCAINE ) 1 % injection 5 mL   iohexol  (OMNIPAQUE ) 180 MG/ML injection 10 mL   dexamethasone  (DECADRON ) injection 10 mg   Patient's Medications  New Prescriptions   No medications on file  Previous Medications   ALBUTEROL  (VENTOLIN  HFA) 108 (90 BASE) MCG/ACT INHALER    Inhale 1-2 puffs into  the lungs every 6 (six) hours as needed.   AMLODIPINE  (NORVASC ) 5 MG TABLET    Take 1 tablet (5 mg total) by mouth daily.   ASPIRIN  EC 81 MG TABLET    Take 1 tablet (81 mg total) by mouth 3 (three) times a week. On Monday, Wednesday, Friday (Swallow whole).   BREZTRI  AEROSPHERE 160-9-4.8 MCG/ACT AERO    INHALE 2 PUFFS INTO THE LUNGS TWICE A DAY   CARVEDILOL  (COREG ) 6.25 MG TABLET    Take 1 tablet (6.25 mg total) by mouth 2 (two) times daily with a meal.   CYCLOBENZAPRINE  (FLEXERIL ) 10 MG TABLET    Take 10 mg by mouth at bedtime.   LISINOPRIL  (ZESTRIL ) 40 MG TABLET    Take 1 tablet (40 mg total) by mouth daily.   MELATONIN 10 MG TABS    Take 10 mg by mouth at bedtime.   MONTELUKAST  (SINGULAIR ) 10 MG TABLET    TAKE 1 TABLET BY MOUTH EVERYDAY AT BEDTIME   OMEPRAZOLE  (PRILOSEC) 40 MG CAPSULE    Take 1 capsule (40 mg total) by mouth daily.   PREDNISONE  (STERAPRED UNI-PAK 21 TAB) 10 MG (21) TBPK TABLET    Take by mouth daily for 12 days. 6 tablets day one and two, five tablets day 3 and day 4,  and  taper by one tablet every other day  over 12 days as directed   PREGABALIN  (LYRICA ) 300 MG CAPSULE    Take 1 capsule (300 mg total) by mouth daily.   ROSUVASTATIN  (CRESTOR ) 10 MG TABLET    TAKE 1 TABLET BY MOUTH EVERY DAY   SEMAGLUTIDE -WEIGHT MANAGEMENT (WEGOVY ) 1 MG/0.5ML SOAJ SQ INJECTION    Inject 1 mg into the skin once a week.   VARDENAFIL  (LEVITRA ) 10 MG TABLET    Take 1 tablet (10 mg total) by mouth daily as needed for erectile dysfunction.  Modified Medications   Modified Medication Previous Medication   OXYCODONE -ACETAMINOPHEN  (PERCOCET) 10-325 MG  TABLET oxyCODONE -acetaminophen  (PERCOCET) 10-325 MG tablet      Take 1 tablet by mouth every 6 (six) hours as needed for pain.    Take 1 tablet by mouth every 6 (six) hours as needed for pain.  Discontinued Medications   No medications on file   ----------------------------------------------------------------------------------------------------------------------  Follow-up: Return in about 1 month (around 07/31/2024) for evaluation, procedure.    Lynwood KANDICE Clause, MD

## 2024-07-03 LAB — TOXASSURE SELECT 13 (MW), URINE

## 2024-07-14 MED ORDER — OXYCODONE-ACETAMINOPHEN 10-325 MG PO TABS: 1.0000 | ORAL_TABLET | Freq: Four times a day (QID) | ORAL | 0 refills | Status: DC | PRN

## 2024-07-14 NOTE — Addendum Note (Signed)
 Addended by: MYRA LYNWOOD MATSU on: 07/14/2024 10:02 AM   Modules accepted: Orders

## 2024-07-16 ENCOUNTER — Other Ambulatory Visit: Payer: Self-pay | Admitting: Internal Medicine

## 2024-07-17 ENCOUNTER — Ambulatory Visit: Admission: RE | Admit: 2024-07-17 | Discharge: 2024-07-17 | Attending: Acute Care | Admitting: Acute Care

## 2024-07-17 DIAGNOSIS — Z87891 Personal history of nicotine dependence: Secondary | ICD-10-CM | POA: Insufficient documentation

## 2024-07-17 DIAGNOSIS — Z122 Encounter for screening for malignant neoplasm of respiratory organs: Secondary | ICD-10-CM | POA: Diagnosis present

## 2024-07-17 DIAGNOSIS — F17210 Nicotine dependence, cigarettes, uncomplicated: Secondary | ICD-10-CM | POA: Insufficient documentation

## 2024-07-18 ENCOUNTER — Other Ambulatory Visit: Payer: Self-pay | Admitting: Internal Medicine

## 2024-07-18 NOTE — Telephone Encounter (Unsigned)
 Copied from CRM #[id number]. Topic: Clinical - Medication Refill >> Jul 18, 2024 11:20 AM [place] MATSU wrote: Medication: semaglutide -weight management (WEGOVY ) 1 MG/0.5ML SOAJ SQ injection   Has the patient contacted their pharmacy? Yes (Agent: If no, request that the patient contact the pharmacy for the refill. If patient does not wish to contact the pharmacy document the reason why and proceed with request.) (Agent: If yes, when and what did the pharmacy advise?)  This is the patient's preferred pharmacy:  [id number] - [place], [place] - [address] [place] [address] Phone: [phone] Fax: [phone]  Is this the correct pharmacy for this prescription? Yes If no, delete pharmacy and type the correct one.   Has the prescription been filled recently? Yes  Is the patient out of the medication? Yes  Has the patient been seen for an appointment in the last year OR does the patient have an upcoming appointment? Yes  Can we respond through MyChart? Yes  Agent: Please be advised that Rx refills may take up to 3 business days. We ask that you follow-up with your pharmacy.

## 2024-07-18 NOTE — Telephone Encounter (Signed)
 Requested Prescriptions  Pending Prescriptions Disp Refills   WEGOVY  1 MG/0.5ML SOAJ SQ injection [Pharmacy Med Name: WEGOVY  1 MG/0.5 ML PEN] 6 mL 1    Sig: INJECT 1MG  INTO THE SKIN ONCE A WEEK     Endocrinology:  Diabetes - GLP-1 Receptor Agonists - semaglutide  Passed - 07/18/2024  3:34 PM      Passed - HBA1C in normal range and within 180 days    Hgb A1c MFr Bld  Date Value Ref Range Status  05/07/2024 5.3 <5.7 % Final    Comment:    For the purpose of screening for the presence of diabetes: . <5.7%       Consistent with the absence of diabetes 5.7-6.4%    Consistent with increased risk for diabetes             (prediabetes) > or =6.5%  Consistent with diabetes . This assay result is consistent with a decreased risk of diabetes. . Currently, no consensus exists regarding use of hemoglobin A1c for diagnosis of diabetes in children. . According to American Diabetes Association (ADA) guidelines, hemoglobin A1c <7.0% represents optimal control in non-pregnant diabetic patients. Different metrics may apply to specific patient populations.  Standards of Medical Care in Diabetes(ADA). .          Passed - Cr in normal range and within 360 days    Creat  Date Value Ref Range Status  05/07/2024 0.85 0.70 - 1.35 mg/dL Final         Passed - Valid encounter within last 6 months    Recent Outpatient Visits           2 months ago Mixed hyperlipidemia   [place] [facility] [place], Angeline ORN, NP   8 months ago Encounter for general adult medical examination with abnormal findings   [place] [facility] [place], Angeline ORN, NP

## 2024-07-22 NOTE — Telephone Encounter (Signed)
 Rx 07/18/24 6ml 1RF- duplicate request Requested Prescriptions  Pending Prescriptions Disp Refills   semaglutide -weight management (WEGOVY ) 1 MG/0.5ML SOAJ SQ injection 6 mL 1     Endocrinology:  Diabetes - GLP-1 Receptor Agonists - semaglutide  Passed - 07/22/2024  1:55 PM      Passed - HBA1C in normal range and within 180 days    Hgb A1c MFr Bld  Date Value Ref Range Status  05/07/2024 5.3 <5.7 % Final    Comment:    For the purpose of screening for the presence of diabetes: . <5.7%       Consistent with the absence of diabetes 5.7-6.4%    Consistent with increased risk for diabetes             (prediabetes) > or =6.5%  Consistent with diabetes . This assay result is consistent with a decreased risk of diabetes. . Currently, no consensus exists regarding use of hemoglobin A1c for diagnosis of diabetes in children. . According to American Diabetes Association (ADA) guidelines, hemoglobin A1c <7.0% represents optimal control in non-pregnant diabetic patients. Different metrics may apply to specific patient populations.  Standards of Medical Care in Diabetes(ADA). .          Passed - Cr in normal range and within 360 days    Creat  Date Value Ref Range Status  05/07/2024 0.85 0.70 - 1.35 mg/dL Final         Passed - Valid encounter within last 6 months    Recent Outpatient Visits           2 months ago Mixed hyperlipidemia   [place] [facility] [place], Angeline ORN, NP   8 months ago Encounter for general adult medical examination with abnormal findings   [facility] [place], Angeline ORN, NP

## 2024-07-23 ENCOUNTER — Other Ambulatory Visit: Payer: Self-pay | Admitting: Anesthesiology

## 2024-07-28 ENCOUNTER — Telehealth: Payer: Self-pay

## 2024-07-28 ENCOUNTER — Other Ambulatory Visit: Payer: Self-pay | Admitting: Acute Care

## 2024-07-28 ENCOUNTER — Telehealth: Payer: Self-pay | Admitting: Anesthesiology

## 2024-07-28 ENCOUNTER — Other Ambulatory Visit: Payer: Self-pay

## 2024-07-28 DIAGNOSIS — Z87891 Personal history of nicotine dependence: Secondary | ICD-10-CM

## 2024-07-28 DIAGNOSIS — F17210 Nicotine dependence, cigarettes, uncomplicated: Secondary | ICD-10-CM

## 2024-07-28 DIAGNOSIS — Z122 Encounter for screening for malignant neoplasm of respiratory organs: Secondary | ICD-10-CM

## 2024-07-28 NOTE — Telephone Encounter (Signed)
Message sent to Dr Adams.  

## 2024-07-28 NOTE — Telephone Encounter (Signed)
 Pt stated that Dr. Myra told him to call in to get his prescription filled. He only has one pill of oxycodone  left.

## 2024-07-31 ENCOUNTER — Other Ambulatory Visit: Payer: Self-pay | Admitting: Internal Medicine

## 2024-08-01 NOTE — Telephone Encounter (Signed)
 Requested Prescriptions  Refused Prescriptions Disp Refills   amLODipine  (NORVASC ) 5 MG tablet [Pharmacy Med Name: amLODIPine  Besylate 5 MG Oral Tablet] 100 tablet 2    Sig: TAKE 1 TABLET BY MOUTH DAILY     Cardiovascular: Calcium  Channel Blockers 2 Failed - 08/01/2024 11:58 AM      Failed - Last BP in normal range    BP Readings from Last 1 Encounters:  06/30/24 (!) 162/107         Passed - Last Heart Rate in normal range    Pulse Readings from Last 1 Encounters:  06/30/24 74         Passed - Valid encounter within last 6 months    Recent Outpatient Visits           2 months ago Mixed hyperlipidemia   [place] [facility] [place], Angeline ORN, NP   8 months ago Encounter for general adult medical examination with abnormal findings   [place] [facility] [place], Angeline ORN, NP

## 2024-08-02 ENCOUNTER — Other Ambulatory Visit: Payer: Self-pay | Admitting: Anesthesiology

## 2024-08-06 ENCOUNTER — Ambulatory Visit: Payer: Self-pay

## 2024-08-06 ENCOUNTER — Ambulatory Visit

## 2024-08-06 ENCOUNTER — Ambulatory Visit: Payer: Self-pay | Admitting: Family Medicine

## 2024-08-06 ENCOUNTER — Ambulatory Visit
Admission: EM | Admit: 2024-08-06 | Discharge: 2024-08-06 | Disposition: A | Discharge status: Home / Self Care | Attending: Family Medicine | Admitting: Family Medicine

## 2024-08-06 DIAGNOSIS — M25571 Pain in right ankle and joints of right foot: Secondary | ICD-10-CM | POA: Diagnosis present

## 2024-08-06 LAB — CBC WITH DIFFERENTIAL/PLATELET
Abs Immature Granulocytes: 0.06 K/uL (ref 0.00–0.07)
Basophils Absolute: 0 K/uL (ref 0.0–0.1)
Basophils Relative: 0 %
Eosinophils Absolute: 0.1 K/uL (ref 0.0–0.5)
Eosinophils Relative: 1 %
HCT: 39.1 % (ref 39.0–52.0)
Hemoglobin: 13.7 g/dL (ref 13.0–17.0)
Immature Granulocytes: 1 %
Lymphocytes Relative: 26 %
Lymphs Abs: 2.3 K/uL (ref 0.7–4.0)
MCH: 31.4 pg (ref 26.0–34.0)
MCHC: 35 g/dL (ref 30.0–36.0)
MCV: 89.5 fL (ref 80.0–100.0)
Monocytes Absolute: 0.7 K/uL (ref 0.1–1.0)
Monocytes Relative: 7 %
Neutro Abs: 5.8 K/uL (ref 1.7–7.7)
Neutrophils Relative %: 65 %
Platelets: 230 K/uL (ref 150–400)
RBC: 4.37 MIL/uL (ref 4.22–5.81)
RDW: 13.1 % (ref 11.5–15.5)
WBC: 9 K/uL (ref 4.0–10.5)
nRBC: 0 % (ref 0.0–0.2)

## 2024-08-06 MED ORDER — PREDNISONE 10 MG (21) PO TBPK: ORAL_TABLET | Freq: Every day | ORAL | 0 refills | Status: DC

## 2024-08-06 MED ORDER — DEXAMETHASONE SOD PHOSPHATE PF 10 MG/ML IJ SOLN
10.0000 mg | Freq: Once | INTRAMUSCULAR | Status: AC
  Administered 2024-08-06: 10 mg via INTRAMUSCULAR

## 2024-08-06 MED ORDER — COLCHICINE 0.6 MG PO TABS: 0.6000 mg | ORAL_TABLET | ORAL | 0 refills | Status: DC

## 2024-08-06 NOTE — ED Triage Notes (Signed)
 Pt c/o R foot pain & edema x1 wk. States has been worsening daily. Denies any injuries. Has tried percocet w/o relief.

## 2024-08-06 NOTE — Discharge Instructions (Addendum)
 See handout on gout.  Start your prednisone  taper tomorrow.  You can start your colchicine  tonight.  For gout treatment: reduce initial dose to 0.6 mg x 1 dose, with next dose no sooner than 3 days later. Repeat in 3 days.

## 2024-08-06 NOTE — ED Provider Notes (Signed)
 " [facility]    CSN: [id number] Arrival date & time: 08/06/24  1536      History   Chief Complaint Chief Complaint  Patient presents with   Foot Pain    HPI  HPI Bryan Ibarra. is a 69 y.o. male.   Bryan Ibarra presents for right foot pain with swelling for the past week.  Pain described as aching and scored 9 /10. He was not able to get the shoe on today as it hurt to bad. The sheets on the bed hurt. He doesn't have a history of gout.  The foot is swollen and red and its getting worse.  The cold has helped the swelling.  Took some percocet and rollon pain medication.   He doesn't drink any alcohol. They ate prime rib the day after Christmas. No seafood.      Past Medical History:  Diagnosis Date   Alcohol abuse    a. up to a 12 pack of beer/day. (hx of, not currently) 04/10/23   Arthritis    CAD in native artery    a. LHC 6/18: severe 1 vessel CAD with 90% thrombotic stenosis in the proximal LAD which was felt to be the culprit for the patient's non-STEMI. He underwent successful PCI/DES with a resolute onyx drug-eluting stent (3.0 x 18 mm). There was 0% residual stenosis. LVEF estimated at 55-65% by visual estimate. LVEDP was mildly elevated   Centrilobular emphysema (HCC)    Dyspnea    ED (erectile dysfunction)    GERD (gastroesophageal reflux disease)    H/O echocardiogram    a. echo 6/18: EF 60-65%, normal wall motion, Ibarra 1 diastolic dysfunction, RV cavity size was normal with normal wall thickness and normal RV systolic function. There were no significant valvular abnormalities   Hearing loss    HLD (hyperlipidemia)    Hypertension    Marijuana abuse    hx of. Not currently (04/10/23)   NSTEMI (non-ST elevated myocardial infarction) (HCC)    a. LHC 6/18: severe 1 vessel CAD with 90% thrombotic stenosis in the proximal LAD which was felt to be the culprit for the patient's non-STEMI. He underwent successful PCI/DES with a resolute onyx drug-eluting stent  (3.0 x 18 mm). There was 0% residual stenosis. LVEF estimated at 55-65% by visual estimate. LVEDP was mildly elevated   Senile purpura    Spinal stenosis    Tobacco abuse     Patient Active Problem List   Diagnosis Date Noted   Prediabetes 05/07/2024   Chronic pain syndrome 07/12/2023   Narcotic dependence (HCC) 06/22/2023   Aortic atherosclerosis 11/28/2022   Erectile dysfunction 11/28/2022   Chronic back pain 11/28/2022   Centrilobular emphysema (HCC) 08/31/2022   Overweight with body mass index (BMI) of 29 to 29.9 in adult 05/16/2022   GERD (gastroesophageal reflux disease) 05/16/2022   Hypertension    CAD in native artery    HLD (hyperlipidemia)    History of non-ST elevation myocardial infarction (NSTEMI) 01/01/2017    Past Surgical History:  Procedure Laterality Date   APPENDECTOMY     COLONOSCOPY     CORONARY STENT INTERVENTION N/A 01/01/2017   Procedure: Coronary Stent Intervention;  Surgeon: Bryan Deatrice LABOR, MD;  Location: [facility];  Service: Cardiovascular;  Laterality: N/A;   KNEE SURGERY Left    x2 (repairs)   LEFT HEART CATH AND CORONARY ANGIOGRAPHY N/A 01/01/2017   Procedure: Left Heart Cath and Coronary Angiography;  Surgeon: Bryan Deatrice LABOR, MD;  Location: [facility];  Service: Cardiovascular;  Laterality: N/A;   NASAL ENDOSCOPY N/A 08/02/2019   Procedure: NASAL ENDOSCOPY;  Surgeon: Bryan Calvert GRADE, MD;  Location: [facility] ORS;  Service: ENT;  Laterality: N/A;   NASAL HEMORRHAGE CONTROL N/A 08/02/2019   Procedure: EPISTAXIS CONTROL;  Surgeon: Bryan Calvert GRADE, MD;  Location: [facility] OR[facility];  Service: ENT;  Laterality: N/A;   TRANSFORAMINAL LUMBAR INTERBODY FUSION (TLIF) WITH PEDICLE SCREW FIXATION 1 LEVEL Right 04/12/2023   Procedure: RIGHT-SIDED LUMBAR 4 - LUMBAR 5 TRANSFORAMINAL LUMBAR INTERBODY FUSION WITH INSTRUMENTATION AND ALLOGRAFT;  Surgeon: Bryan Anes, MD;  Location: [facility];  Service: Orthopedics;  Laterality: Right;       Home  Medications    Prior to Admission medications  Medication Sig Start Date End Date Taking? Authorizing Provider  colchicine  0.6 MG tablet Take 1 tablet (0.6 mg total) by mouth every 3 (three) days. 08/06/24  Yes Sebrina Kessner, DO  predniSONE  (STERAPRED UNI-PAK 21 TAB) 10 MG (21) TBPK tablet Take by mouth daily. Take 6 tabs by mouth daily for 1, then 5 tabs for 1 day, then 4 tabs for 1 day, then 3 tabs for 1 day, then 2 tabs for 1 day, then 1 tab for 1 day. 08/06/24  Yes Addison Whidbee, DO  albuterol  (VENTOLIN  HFA) 108 (90 Base) MCG/ACT inhaler Inhale 1-2 puffs into the lungs every 6 (six) hours as needed. 05/07/24   Bryan Angeline ORN, NP  amLODipine  (NORVASC ) 5 MG tablet Take 1 tablet (5 mg total) by mouth daily. 02/27/24   Bryan Frederick, NP  aspirin  EC 81 MG tablet Take 1 tablet (81 mg total) by mouth 3 (three) times a week. On Monday, Wednesday, Friday (Swallow whole). 02/27/24   Bryan Frederick, NP  BREZTRI  AEROSPHERE 160-9-4.8 MCG/ACT AERO INHALE 2 PUFFS INTO THE LUNGS TWICE A DAY Patient taking differently: as needed. 10/30/23   Bryan Angeline ORN, NP  carvedilol  (COREG ) 6.25 MG tablet Take 1 tablet (6.25 mg total) by mouth 2 (two) times daily with a meal. 02/27/24   Bryan Ibarra, Sheri, NP  cyclobenzaprine  (FLEXERIL ) 10 MG tablet TAKE 1 TABLET BY MOUTH EVERYDAY AT BEDTIME 08/06/24   Bryan Lynwood MATSU, MD  lisinopril  (ZESTRIL ) 40 MG tablet Take 1 tablet (40 mg total) by mouth daily. 02/27/24   Bryan Frederick, NP  Melatonin 10 MG TABS Take 10 mg by mouth at bedtime.    [provider]  montelukast  (SINGULAIR ) 10 MG tablet TAKE 1 TABLET BY MOUTH EVERYDAY AT BEDTIME 06/17/24   Bryan Angeline ORN, NP  omeprazole  (PRILOSEC) 40 MG capsule Take 1 capsule (40 mg total) by mouth daily. 05/07/24   Bryan Angeline ORN, NP  oxyCODONE -acetaminophen  (PERCOCET) 10-325 MG tablet Take 1 tablet by mouth every 6 (six) hours as needed for pain. 08/05/24 09/04/24  Bryan Lynwood MATSU, MD  pregabalin  (LYRICA ) 300 MG capsule Take 1 capsule (300 mg  total) by mouth daily. 04/10/24 07/09/24  Bryan Lynwood MATSU, MD  rosuvastatin  (CRESTOR ) 10 MG tablet TAKE 1 TABLET BY MOUTH EVERY DAY 02/27/24   Bryan Frederick, NP  semaglutide -weight management (WEGOVY ) 1 MG/0.5ML SOAJ SQ injection INJECT 1MG  INTO THE SKIN ONCE A WEEK 07/18/24   Bryan Angeline ORN, NP  vardenafil  (LEVITRA ) 10 MG tablet Take 1 tablet (10 mg total) by mouth daily as needed for erectile dysfunction. 05/15/23   Bryan Angeline ORN, NP    Family History Family History  Problem Relation Age of Onset   Osteoporosis Mother    CAD Father  s/p CABG in his 74's or 50's.   Diabetes Father    Colon polyps Brother    CAD Paternal Grandfather     Social History Social History[1]   Allergies   Patient has no known allergies.   Review of Systems Review of Systems: :negative unless otherwise stated in HPI.      Physical Exam Triage Vital Signs ED Triage Vitals  Encounter Vitals Group     BP 08/06/24 1700 130/87     Girls Systolic BP Percentile --      Girls Diastolic BP Percentile --      Boys Systolic BP Percentile --      Boys Diastolic BP Percentile --      Pulse Rate 08/06/24 1700 74     Resp 08/06/24 1700 16     Temp 08/06/24 1700 97.8 F (36.6 C)     Temp Source 08/06/24 1700 Oral     SpO2 08/06/24 1700 95 %     Weight 08/06/24 1659 203 lb 9.6 oz (92.4 kg)     Height --      Head Circumference --      Peak Flow --      Pain Score 08/06/24 1700 9     Pain Loc --      Pain Education --      Exclude from Growth Chart --    No data found.  Updated Vital Signs BP 130/87 (BP Location: Left Arm)   Pulse 74   Temp 97.8 F (36.6 C) (Oral)   Resp 16   Wt 92.4 kg   SpO2 95%   BMI 26.14 kg/m   Visual Acuity Right Eye Distance:   Left Eye Distance:   Bilateral Distance:    Right Eye Near:   Left Eye Near:    Bilateral Near:     Physical Exam GEN: well appearing male in no acute distress  CVS: well perfused  RESP: speaking in full sentences without  pause, no respiratory distress  MSK:   Ankle/Foot, Right: TTP noted at the bilateral malleoli, first MTP with. visible erythema, swelling but no ecchymosis, or bony deformity. No evidence of tibiotalar deviation; Range of motion is full in all directions. Strength is 5/5 in all directions. No tenderness at the insertion/body/myotendinous junction of the Achilles tendon;  Unremarkable squeeze; Talar dome mildly tender; Unremarkable calcaneal squeeze; No plantar calcaneal tenderness; Mild edema and tenderness over the navicular prominence or  over cuboid; No pain at base of 5th MT; +Tenderness at the distal metatarsals; Able to walk 4 steps but with pain, not wearing a right shoe     UC Treatments / Results  Labs (all labs ordered are listed, but only abnormal results are displayed) Labs Reviewed  URIC ACID  CBC WITH DIFFERENTIAL/PLATELET    EKG   Radiology DG Foot Complete Right Result Date: 08/06/2024 EXAM: 3 OR MORE VIEW(S) XRAY OF THE RIGHT FOOT 08/06/2024 05:50:14 PM COMPARISON: None available. CLINICAL HISTORY: pain, swelling, redness x1 week FINDINGS: BONES AND JOINTS: No acute fracture. No malalignment. Calcaneal spur. Mild first MTP joint osteoarthritis. SOFT TISSUES: Mild forefoot soft tissue swelling. IMPRESSION: 1. Mild forefoot soft tissue swelling. 2. Mild first MTP joint osteoarthritis. Electronically signed by: Elsie Gravely MD 08/06/2024 06:05 PM EST RP Workstation: [id number]   DG Ankle Complete Right Result Date: 08/06/2024 EXAM: 3 OR MORE VIEW(S) XRAY OF THE RIGHT ANKLE 08/06/2024 05:50:14 PM CLINICAL HISTORY: pain, swelling, redness x1 week COMPARISON: None available. FINDINGS: BONES AND JOINTS:  Small plantar calcaneal spur. No acute fracture. No malalignment. SOFT TISSUES: Unremarkable. IMPRESSION: 1. No acute findings. Electronically signed by: Elsie Gravely MD 08/06/2024 06:04 PM EST RP Workstation: [id number]     Procedures Procedures (including critical care  time)  Medications Ordered in UC Medications  dexamethasone  (DECADRON ) injection 10 mg (10 mg Intramuscular Given 08/06/24 1752)    Initial Impression / Assessment and Plan / UC Course  I have reviewed the triage vital signs and the nursing notes.  Pertinent labs & imaging results that were available during my care of the patient were reviewed by me and considered in my medical decision making (see chart for details).      Pt is a 70 y.o.  male with 1 week of right foot and ankle pain, redness and swelling without known injury.  On exam, pt has tenderness at his bilateral malleoli, midfoot, first MTP concerning for bony erosion and virtuous injury.   Obtained right ankle and foot plain films.  Personally interpreted by me were unremarkable for fracture or dislocation. Radiologist report reviewed and additionally notes first MTP osteoarthritis.  Patient given Decadron  10 mg IM.  CBC with differential and uric acid obtained.  Patient to gradually return to normal activities, as tolerated and continue ordinary activities within the limits permitted by pain. Prescribed GP dose colchicine  and prednisone  taper to start tomorrow for pain relief.  Tylenol  PRN.   Patient to follow up with orthopedic provider, if symptoms do not improve with conservative treatment.  Return and ED precautions given. Understanding voiced. Discussed MDM, treatment plan and plan for follow-up with patient who agrees with plan.   Final Clinical Impressions(s) / UC Diagnoses   Final diagnoses:  Pain in joint involving right ankle and foot     Discharge Instructions      See handout on gout.  Start your prednisone  taper tomorrow.  You can start your colchicine  tonight.  For gout treatment: reduce initial dose to 0.6 mg x 1 dose, with next dose no sooner than 3 days later. Repeat in 3 days.      ED Prescriptions     Medication Sig Dispense Auth. Provider   predniSONE  (STERAPRED UNI-PAK 21 TAB) 10 MG (21) TBPK  tablet Take by mouth daily. Take 6 tabs by mouth daily for 1, then 5 tabs for 1 day, then 4 tabs for 1 day, then 3 tabs for 1 day, then 2 tabs for 1 day, then 1 tab for 1 day. 21 tablet Zonnie Landen, DO   colchicine  0.6 MG tablet Take 1 tablet (0.6 mg total) by mouth every 3 (three) days. 3 tablet Jaxxson Cavanah, DO      PDMP not reviewed this encounter.     [1]  Social History Tobacco Use   Smoking status: Every Day    Current packs/day: 1.00    Average packs/day: 1 pack/day for 42.0 years (42.0 ttl pk-yrs)    Types: Cigarettes   Smokeless tobacco: Never   Tobacco comments:    About 8 cigarettes per day (04/10/23)  Vaping Use   Vaping status: Never Used  Substance Use Topics   Alcohol use: Not Currently   Drug use: Not Currently     Keshun Berrett, DO 08/06/24 1959  "

## 2024-08-06 NOTE — Telephone Encounter (Signed)
 FYI Only or Action Required?: FYI only for provider: UC advised .  Patient was last seen in primary care on 05/07/2024 by Antonette Angeline ORN, NP.  Called Nurse Triage reporting Foot Pain.  Symptoms began a week ago.  Interventions attempted: OTC medications: topical cream.  Symptoms are: gradually worsening.  Triage Disposition: See HCP Within 4 Hours (Or PCP Triage)  Patient/caregiver understands and will follow disposition?: Yes                   Reason for Disposition  [1] SEVERE pain (e.g., excruciating, unable to do any normal activities) AND [2] not improved after 2 hours of pain medicine  Answer Assessment - Initial Assessment Questions Patient reports right foot , pain is horrible, started 1 week ago. Progressively gotten worse. Foot and swelled up feels like walking on wet sponge. Even the sheet touching the skin hurts, no sores or drainage. There is redness entire right ankle all the down into and toes. Being treated for sciatica nerve pain history spinal surgery.  Pain level for right foot 9/10 pain level. Nothing helping. No same day visits.  Patient advised urgent care  ,. Patient is agreeable and going to [facility] in [place] now. FYI to office.    1. ONSET: When did the pain start?      1 week ago getting worse  2. LOCATION: Where is the pain located?      Right foot ankle and toes  3. PAIN: How bad is the pain?    (Scale 1-10; or mild, moderate, severe)     9/10  5. CAUSE: What do you think is causing the foot pain?     Unknown , no injury  6. OTHER SYMPTOMS: Do you have any other symptoms? (e.g., leg pain, rash, fever, numbness)     Very angry red, swelling painful ,limping  Protocols used: Foot Pain-A-AH Copied from CRM [id number]. Topic: Clinical - Red Word Triage >> Aug 06, 2024  2:49 PM Lauren C wrote: Red Word that prompted transfer to Nurse Triage: Constant severe pain in foot and toes, swollen, red.

## 2024-08-07 LAB — URIC ACID: Uric Acid, Serum: <1.5 mg/dL — ABNORMAL LOW (ref 3.7–8.6)

## 2024-08-08 MED ORDER — DOXYCYCLINE HYCLATE 100 MG PO CAPS: 100.0000 mg | ORAL_CAPSULE | Freq: Two times a day (BID) | ORAL | 0 refills | Status: DC

## 2024-08-12 ENCOUNTER — Other Ambulatory Visit: Payer: Self-pay | Admitting: Internal Medicine

## 2024-08-12 ENCOUNTER — Other Ambulatory Visit: Payer: Self-pay | Admitting: Anesthesiology

## 2024-08-12 DIAGNOSIS — G894 Chronic pain syndrome: Secondary | ICD-10-CM

## 2024-08-13 ENCOUNTER — Ambulatory Visit: Attending: Anesthesiology | Admitting: Anesthesiology

## 2024-08-13 ENCOUNTER — Ambulatory Visit: Admission: RE | Admit: 2024-08-13 | Source: Ambulatory Visit

## 2024-08-13 ENCOUNTER — Other Ambulatory Visit: Payer: Self-pay | Admitting: Internal Medicine

## 2024-08-13 ENCOUNTER — Encounter: Payer: Self-pay | Admitting: Anesthesiology

## 2024-08-13 VITALS — BP 107/74 | HR 83 | Temp 97.3°F | Resp 16 | Ht 74.0 in | Wt 204.0 lb

## 2024-08-13 DIAGNOSIS — M5431 Sciatica, right side: Secondary | ICD-10-CM

## 2024-08-13 DIAGNOSIS — G894 Chronic pain syndrome: Secondary | ICD-10-CM | POA: Diagnosis not present

## 2024-08-13 DIAGNOSIS — F1190 Opioid use, unspecified, uncomplicated: Secondary | ICD-10-CM

## 2024-08-13 DIAGNOSIS — M961 Postlaminectomy syndrome, not elsewhere classified: Secondary | ICD-10-CM

## 2024-08-13 DIAGNOSIS — M47816 Spondylosis without myelopathy or radiculopathy, lumbar region: Secondary | ICD-10-CM

## 2024-08-13 MED ORDER — LIDOCAINE HCL (PF) 1 % IJ SOLN: 5.0000 mL | Freq: Once | INTRAMUSCULAR | Status: DC

## 2024-08-13 MED ORDER — SODIUM CHLORIDE 0.9% FLUSH: 10.0000 mL | Freq: Once | INTRAVENOUS | Status: DC

## 2024-08-13 MED ORDER — TRIAMCINOLONE ACETONIDE 40 MG/ML IJ SUSP: 40.0000 mg | Freq: Once | INTRAMUSCULAR | Status: DC

## 2024-08-13 MED ORDER — IOHEXOL 180 MG/ML  SOLN: 10.0000 mL | Freq: Once | INTRAMUSCULAR | Status: DC | PRN

## 2024-08-13 MED ORDER — ROPIVACAINE HCL 2 MG/ML IJ SOLN: 10.0000 mL | Freq: Once | INTRAMUSCULAR | Status: DC

## 2024-08-13 NOTE — Progress Notes (Signed)
 Safety precautions to be maintained throughout the outpatient stay will include: orient to surroundings, keep bed in low position, maintain call bell within reach at all times, provide assistance with transfer out of bed and ambulation.

## 2024-08-13 NOTE — Progress Notes (Signed)
 Patient reports that he has infection, cellulitis, on right foot.  He has completed a steroid taper and continues on doxycycline  and began that last Thursday.  This info given to Dr Myra.  Report to Dr Myra and he will be unable to perform procedure today d/t infection

## 2024-08-13 NOTE — Telephone Encounter (Signed)
 Requested medication (s) are due for refill today: na   Requested medication (s) are on the active medication list: yes   Last refill:  07/18/24 #6 ml 1 refills  Future visit scheduled: yes 10/31/24  Notes to clinic:    Pharmacy comment: Alternative Requested:NON-FORMULARY.       Requested Prescriptions  Pending Prescriptions Disp Refills   WEGOVY  1 MG/0.5ML SOAJ SQ injection [Pharmacy Med Name: WEGOVY  1 MG/0.5 ML PEN]  1    Sig: INJECT 1 MG INTO THE SKIN ONE TIME PER WEEK     Endocrinology:  Diabetes - GLP-1 Receptor Agonists - semaglutide  Passed - 08/13/2024  1:51 PM      Passed - HBA1C in normal range and within 180 days    Hgb A1c MFr Bld  Date Value Ref Range Status  05/07/2024 5.3 <5.7 % Final    Comment:    For the purpose of screening for the presence of diabetes: . <5.7%       Consistent with the absence of diabetes 5.7-6.4%    Consistent with increased risk for diabetes             (prediabetes) > or =6.5%  Consistent with diabetes . This assay result is consistent with a decreased risk of diabetes. . Currently, no consensus exists regarding use of hemoglobin A1c for diagnosis of diabetes in children. . According to American Diabetes Association (ADA) guidelines, hemoglobin A1c <7.0% represents optimal control in non-pregnant diabetic patients. Different metrics may apply to specific patient populations.  Standards of Medical Care in Diabetes(ADA). .          Passed - Cr in normal range and within 360 days    Creat  Date Value Ref Range Status  05/07/2024 0.85 0.70 - 1.35 mg/dL Final         Passed - Valid encounter within last 6 months    Recent Outpatient Visits           3 months ago Mixed hyperlipidemia   [facility] [place], Angeline ORN, NP   9 months ago Encounter for general adult medical examination with abnormal findings   [place] [facility] [place], Angeline ORN, NP

## 2024-08-14 NOTE — Telephone Encounter (Signed)
 Requested medication (s) are due for refill today:   Pharmacy comment: Alternative Requested:PA REQUIRED.    Requested medication (s) are on the active medication list: yes  Last refill:  07/18/24  Future visit scheduled: yes  Notes to clinic:    Pharmacy comment: Alternative Requested:PA REQUIRED.       Requested Prescriptions  Pending Prescriptions Disp Refills   WEGOVY  1 MG/0.5ML SOAJ SQ injection [Pharmacy Med Name: WEGOVY  1 MG/0.5 ML PEN]  1    Sig: INJECT 1 MG INTO THE SKIN ONE TIME PER WEEK     Endocrinology:  Diabetes - GLP-1 Receptor Agonists - semaglutide  Passed - 08/14/2024  3:40 PM      Passed - HBA1C in normal range and within 180 days    Hgb A1c MFr Bld  Date Value Ref Range Status  05/07/2024 5.3 <5.7 % Final    Comment:    For the purpose of screening for the presence of diabetes: . <5.7%       Consistent with the absence of diabetes 5.7-6.4%    Consistent with increased risk for diabetes             (prediabetes) > or =6.5%  Consistent with diabetes . This assay result is consistent with a decreased risk of diabetes. . Currently, no consensus exists regarding use of hemoglobin A1c for diagnosis of diabetes in children. . According to American Diabetes Association (ADA) guidelines, hemoglobin A1c <7.0% represents optimal control in non-pregnant diabetic patients. Different metrics may apply to specific patient populations.  Standards of Medical Care in Diabetes(ADA). .          Passed - Cr in normal range and within 360 days    Creat  Date Value Ref Range Status  05/07/2024 0.85 0.70 - 1.35 mg/dL Final         Passed - Valid encounter within last 6 months    Recent Outpatient Visits           3 months ago Mixed hyperlipidemia   [place] [facility] [place], Angeline ORN, NP   9 months ago Encounter for general adult medical examination with abnormal findings   [place] [facility] [place], Angeline ORN, NP

## 2024-08-15 ENCOUNTER — Other Ambulatory Visit (HOSPITAL_COMMUNITY): Payer: Self-pay

## 2024-08-18 ENCOUNTER — Encounter: Payer: Self-pay | Admitting: Anesthesiology

## 2024-08-18 ENCOUNTER — Other Ambulatory Visit (HOSPITAL_COMMUNITY): Payer: Self-pay

## 2024-08-18 MED ORDER — OXYCODONE-ACETAMINOPHEN 10-325 MG PO TABS: 1.0000 | ORAL_TABLET | Freq: Four times a day (QID) | ORAL | 0 refills | Status: DC | PRN

## 2024-08-18 NOTE — Progress Notes (Signed)
 "  Subjective:  Patient ID: Bryan Ibarra., male    DOB: 1956/04/28  Age: 69 y.o. MRN: [id number]  CC: Back Pain (Lumbar right is worse )   Procedure: L5-S1 epidural steroid under fluoroscopic guidance with minimal sedation  HPI Bryan Ibarra. presents for reevaluation.  His sciatica following his December epidural is markedly better.  He is rating this today 50 to 60% improvement and desires to proceed with a repeat epidural today.  The quality characteristic and distribution of the pain is otherwise unchanged.  He takes his medication as prescribed and this continues to work well for him and is without side effect.  No change in lower extremity strength function or bowel or bladder function is noted at this time.  He is describing a reduction of a pain score from a 9 down to about a 4 at this point.  Outpatient Medications Prior to Visit  Medication Sig Dispense Refill   albuterol  (VENTOLIN  HFA) 108 (90 Base) MCG/ACT inhaler Inhale 1-2 puffs into the lungs every 6 (six) hours as needed. 18 g 2   amLODipine  (NORVASC ) 5 MG tablet Take 1 tablet (5 mg total) by mouth daily. 90 tablet 3   aspirin  EC 81 MG tablet Take 1 tablet (81 mg total) by mouth 3 (three) times a week. On Monday, Wednesday, Friday (Swallow whole).     BREZTRI  AEROSPHERE 160-9-4.8 MCG/ACT AERO INHALE 2 PUFFS INTO THE LUNGS TWICE A DAY 32.1 each 2   carvedilol  (COREG ) 6.25 MG tablet Take 1 tablet (6.25 mg total) by mouth 2 (two) times daily with a meal. 180 tablet 3   cyclobenzaprine  (FLEXERIL ) 10 MG tablet TAKE 1 TABLET BY MOUTH EVERYDAY AT BEDTIME 30 tablet 5   doxycycline  (VIBRAMYCIN ) 100 MG capsule Take 1 capsule (100 mg total) by mouth 2 (two) times daily. 20 capsule 0   lisinopril  (ZESTRIL ) 40 MG tablet Take 1 tablet (40 mg total) by mouth daily. 90 tablet 3   Melatonin 10 MG TABS Take 10 mg by mouth at bedtime.     montelukast  (SINGULAIR ) 10 MG tablet TAKE 1 TABLET BY MOUTH EVERYDAY AT BEDTIME 90 tablet 0    omeprazole  (PRILOSEC) 40 MG capsule Take 1 capsule (40 mg total) by mouth daily. 90 capsule 1   oxyCODONE -acetaminophen  (PERCOCET) 10-325 MG tablet Take 1 tablet by mouth every 6 (six) hours as needed for pain. 120 tablet 0   pregabalin  (LYRICA ) 300 MG capsule Take 1 capsule (300 mg total) by mouth daily. 30 capsule 2   rosuvastatin  (CRESTOR ) 10 MG tablet TAKE 1 TABLET BY MOUTH EVERY DAY 90 tablet 3   semaglutide -weight management (WEGOVY ) 1 MG/0.5ML SOAJ SQ injection INJECT 1MG  INTO THE SKIN ONCE A WEEK 6 mL 1   colchicine  0.6 MG tablet Take 1 tablet (0.6 mg total) by mouth every 3 (three) days. (Patient not taking: Reported on 08/13/2024) 3 tablet 0   predniSONE  (STERAPRED UNI-PAK 21 TAB) 10 MG (21) TBPK tablet Take by mouth daily. Take 6 tabs by mouth daily for 1, then 5 tabs for 1 day, then 4 tabs for 1 day, then 3 tabs for 1 day, then 2 tabs for 1 day, then 1 tab for 1 day. (Patient not taking: Reported on 08/13/2024) 21 tablet 0   vardenafil  (LEVITRA ) 10 MG tablet Take 1 tablet (10 mg total) by mouth daily as needed for erectile dysfunction. (Patient not taking: Reported on 08/13/2024) 10 tablet 5   No facility-administered medications prior to visit.  Review of Systems CNS: No confusion or sedation Cardiac: No angina or palpitations GI: No abdominal pain or constipation Constitutional: No nausea vomiting fevers or chills  Objective:  BP 107/74 (BP Location: Right Arm, Patient Position: Sitting, Cuff Size: Normal)   Pulse 83   Temp (!) 97.3 F (36.3 C) (Temporal)   Resp 16   Ht 6' 2 (1.88 m)   Wt 204 lb (92.5 kg)   SpO2 99%   BMI 26.19 kg/m    BP Readings from Last 3 Encounters:  08/13/24 107/74  08/06/24 130/87  06/30/24 (!) 162/107     Wt Readings from Last 3 Encounters:  08/13/24 204 lb (92.5 kg)  08/06/24 203 lb 9.6 oz (92.4 kg)  06/30/24 235 lb (106.6 kg)     Physical Exam Pt is alert and oriented PERRL EOMI HEART IS RRR no murmur or rub LCTA no wheezing or  rales MUSCULOSKELETAL reveals a positive straight leg raise on the right but less dramatic than on previous evaluation.  Muscle tone and bulk is at baseline.  He still walks with an antalgic gait.  Labs  Lab Results  Component Value Date   HGBA1C 5.3 05/07/2024   HGBA1C 5.8 (H) 11/06/2023   HGBA1C 5.5 11/28/2022   Lab Results  Component Value Date   LDLCALC 33 05/07/2024   CREATININE 0.85 05/07/2024    -------------------------------------------------------------------------------------------------------------------- Lab Results  Component Value Date   WBC 9.0 08/06/2024   HGB 13.7 08/06/2024   HCT 39.1 08/06/2024   PLT 230 08/06/2024   GLUCOSE 91 05/07/2024   CHOL 93 05/07/2024   TRIG 68 05/07/2024   HDL 46 05/07/2024   LDLDIRECT <4 06/24/2018   LDLCALC 33 05/07/2024   ALT 13 05/07/2024   AST 12 05/07/2024   NA 140 05/07/2024   K 4.3 05/07/2024   CL 107 05/07/2024   CREATININE 0.85 05/07/2024   BUN 11 05/07/2024   CO2 27 05/07/2024   TSH 0.89 01/30/2020   PSA 1.83 11/06/2023   INR 0.9 08/02/2019   HGBA1C 5.3 05/07/2024    --------------------------------------------------------------------------------------------------------------------- DG Foot Complete Right Result Date: 08/06/2024 EXAM: 3 OR MORE VIEW(S) XRAY OF THE RIGHT FOOT 08/06/2024 05:50:14 PM COMPARISON: None available. CLINICAL HISTORY: pain, swelling, redness x1 week FINDINGS: BONES AND JOINTS: No acute fracture. No malalignment. Calcaneal spur. Mild first MTP joint osteoarthritis. SOFT TISSUES: Mild forefoot soft tissue swelling. IMPRESSION: 1. Mild forefoot soft tissue swelling. 2. Mild first MTP joint osteoarthritis. Electronically signed by: Elsie Gravely MD 08/06/2024 06:05 PM EST RP Workstation: [id number]   DG Ankle Complete Right Result Date: 08/06/2024 EXAM: 3 OR MORE VIEW(S) XRAY OF THE RIGHT ANKLE 08/06/2024 05:50:14 PM CLINICAL HISTORY: pain, swelling, redness x1 week COMPARISON: None  available. FINDINGS: BONES AND JOINTS: Small plantar calcaneal spur. No acute fracture. No malalignment. SOFT TISSUES: Unremarkable. IMPRESSION: 1. No acute findings. Electronically signed by: Elsie Gravely MD 08/06/2024 06:04 PM EST RP Workstation: [id number]     Assessment & Plan:   Naji Mehringer was seen today for back pain.  Diagnoses and all orders for this visit:  Chronic pain syndrome  Chronic, continuous use of opioids  Post laminectomy syndrome  Sciatica of right side -     triamcinolone  acetonide (KENALOG -40) injection 40 mg -     sodium chloride  flush (NS) 0.9 % injection 10 mL -     ropivacaine  (PF) 2 mg/mL (0.2%) (NAROPIN ) injection 10 mL -     lidocaine  (PF) (XYLOCAINE ) 1 % injection 5 mL -  iohexol  (OMNIPAQUE ) 180 MG/ML injection 10 mL  Facet arthritis of lumbar region        ----------------------------------------------------------------------------------------------------------------------  Problem List Items Addressed This Visit       Unprioritized   Chronic pain syndrome - Primary   Relevant Medications   triamcinolone  acetonide (KENALOG -40) injection 40 mg   ropivacaine  (PF) 2 mg/mL (0.2%) (NAROPIN ) injection 10 mL   lidocaine  (PF) (XYLOCAINE ) 1 % injection 5 mL   Other Visit Diagnoses       Chronic, continuous use of opioids         Post laminectomy syndrome         Sciatica of right side       Relevant Medications   triamcinolone  acetonide (KENALOG -40) injection 40 mg   sodium chloride  flush (NS) 0.9 % injection 10 mL   ropivacaine  (PF) 2 mg/mL (0.2%) (NAROPIN ) injection 10 mL   lidocaine  (PF) (XYLOCAINE ) 1 % injection 5 mL   iohexol  (OMNIPAQUE ) 180 MG/ML injection 10 mL     Facet arthritis of lumbar region       Relevant Medications   triamcinolone  acetonide (KENALOG -40) injection 40 mg         ----------------------------------------------------------------------------------------------------------------------  1. Chronic  pain syndrome (Primary) Continue current medication management.  I have reviewed the [place]  practitioner database information is appropriate for refill.  2. Chronic, continuous use of opioids I have reviewed the [place]  practitioner database information is appropriate if we fill his current opioid medications next refill due for February 5.  No other changes initiated.  3. Post laminectomy syndrome As above.  4. Sciatica of right side Will proceed with a repeat epidural.  The risk benefits are once again reviewed all questions answered.  Continue with core stretching strengthening exercises.  Return to clinic as noted. - triamcinolone  acetonide (KENALOG -40) injection 40 mg - sodium chloride  flush (NS) 0.9 % injection 10 mL - ropivacaine  (PF) 2 mg/mL (0.2%) (NAROPIN ) injection 10 mL - lidocaine  (PF) (XYLOCAINE ) 1 % injection 5 mL - iohexol  (OMNIPAQUE ) 180 MG/ML injection 10 mL  5. Facet arthritis of lumbar region As above    ----------------------------------------------------------------------------------------------------------------------  I am having Bryan PHEBE Renna Ibarra. Freddie maintain his Melatonin, vardenafil , Breztri  Aerosphere, aspirin  EC, carvedilol , lisinopril , rosuvastatin , amLODipine , pregabalin , omeprazole , albuterol , montelukast , oxyCODONE -acetaminophen , Wegovy , cyclobenzaprine , predniSONE , colchicine , and doxycycline .   Meds ordered this encounter  Medications   triamcinolone  acetonide (KENALOG -40) injection 40 mg   sodium chloride  flush (NS) 0.9 % injection 10 mL   ropivacaine  (PF) 2 mg/mL (0.2%) (NAROPIN ) injection 10 mL   lidocaine  (PF) (XYLOCAINE ) 1 % injection 5 mL   iohexol  (OMNIPAQUE ) 180 MG/ML injection 10 mL   Patient's Medications  New Prescriptions   No medications on file  Previous Medications   ALBUTEROL  (VENTOLIN  HFA) 108 (90 BASE) MCG/ACT INHALER    Inhale 1-2 puffs into the lungs every 6 (six) hours as needed.   AMLODIPINE   (NORVASC ) 5 MG TABLET    Take 1 tablet (5 mg total) by mouth daily.   ASPIRIN  EC 81 MG TABLET    Take 1 tablet (81 mg total) by mouth 3 (three) times a week. On Monday, Wednesday, Friday (Swallow whole).   BREZTRI  AEROSPHERE 160-9-4.8 MCG/ACT AERO    INHALE 2 PUFFS INTO THE LUNGS TWICE A DAY   CARVEDILOL  (COREG ) 6.25 MG TABLET    Take 1 tablet (6.25 mg total) by mouth 2 (two) times daily with a meal.   COLCHICINE  0.6 MG TABLET    Take 1  tablet (0.6 mg total) by mouth every 3 (three) days.   CYCLOBENZAPRINE  (FLEXERIL ) 10 MG TABLET    TAKE 1 TABLET BY MOUTH EVERYDAY AT BEDTIME   DOXYCYCLINE  (VIBRAMYCIN ) 100 MG CAPSULE    Take 1 capsule (100 mg total) by mouth 2 (two) times daily.   LISINOPRIL  (ZESTRIL ) 40 MG TABLET    Take 1 tablet (40 mg total) by mouth daily.   MELATONIN 10 MG TABS    Take 10 mg by mouth at bedtime.   MONTELUKAST  (SINGULAIR ) 10 MG TABLET    TAKE 1 TABLET BY MOUTH EVERYDAY AT BEDTIME   OMEPRAZOLE  (PRILOSEC) 40 MG CAPSULE    Take 1 capsule (40 mg total) by mouth daily.   OXYCODONE -ACETAMINOPHEN  (PERCOCET) 10-325 MG TABLET    Take 1 tablet by mouth every 6 (six) hours as needed for pain.   PREDNISONE  (STERAPRED UNI-PAK 21 TAB) 10 MG (21) TBPK TABLET    Take by mouth daily. Take 6 tabs by mouth daily for 1, then 5 tabs for 1 day, then 4 tabs for 1 day, then 3 tabs for 1 day, then 2 tabs for 1 day, then 1 tab for 1 day.   PREGABALIN  (LYRICA ) 300 MG CAPSULE    Take 1 capsule (300 mg total) by mouth daily.   ROSUVASTATIN  (CRESTOR ) 10 MG TABLET    TAKE 1 TABLET BY MOUTH EVERY DAY   SEMAGLUTIDE -WEIGHT MANAGEMENT (WEGOVY ) 1 MG/0.5ML SOAJ SQ INJECTION    INJECT 1MG  INTO THE SKIN ONCE A WEEK   VARDENAFIL  (LEVITRA ) 10 MG TABLET    Take 1 tablet (10 mg total) by mouth daily as needed for erectile dysfunction.  Modified Medications   No medications on file  Discontinued Medications   No medications on file    ----------------------------------------------------------------------------------------------------------------------  Follow-up: No follow-ups on file.  Continue follow-up with his primary care physicians for baseline medical care with return to clinic as needed  Lynwood KANDICE Clause, MD  "

## 2024-08-20 ENCOUNTER — Ambulatory Visit: Admitting: Family Medicine

## 2024-08-20 ENCOUNTER — Telehealth: Payer: Self-pay

## 2024-08-20 ENCOUNTER — Ambulatory Visit: Payer: Self-pay

## 2024-08-20 VITALS — BP 130/72 | HR 82 | Ht 74.0 in | Wt 211.0 lb

## 2024-08-20 DIAGNOSIS — M51362 Other intervertebral disc degeneration, lumbar region with discogenic back pain and lower extremity pain: Secondary | ICD-10-CM | POA: Diagnosis not present

## 2024-08-20 DIAGNOSIS — Z981 Arthrodesis status: Secondary | ICD-10-CM

## 2024-08-20 DIAGNOSIS — R600 Localized edema: Secondary | ICD-10-CM | POA: Diagnosis not present

## 2024-08-20 DIAGNOSIS — M792 Neuralgia and neuritis, unspecified: Secondary | ICD-10-CM | POA: Diagnosis not present

## 2024-08-20 DIAGNOSIS — M79671 Pain in right foot: Secondary | ICD-10-CM

## 2024-08-20 MED ORDER — PREGABALIN 300 MG PO CAPS: 300.0000 mg | ORAL_CAPSULE | Freq: Every day | ORAL | 0 refills | Status: DC

## 2024-08-20 MED ORDER — FUROSEMIDE 20 MG PO TABS: 20.0000 mg | ORAL_TABLET | Freq: Every day | ORAL | 0 refills | Status: AC | PRN

## 2024-08-20 NOTE — Telephone Encounter (Signed)
 Pt also asking if there's an update on the PA for Wegovy , pt is overdue. Thank you.   FYI Only or Action Required?: FYI only for provider: appointment scheduled on 08/20/24.  Patient was last seen in primary care on 05/07/2024 by Antonette Angeline ORN, NP.  Called Nurse Triage reporting Pain.  Symptoms began 2 weeks ago.  Interventions attempted: Prescription medications: doxycycline  and prednisone .  Symptoms are: gradually worsening.  Triage Disposition: See HCP Within 4 Hours (Or PCP Triage)  Patient/caregiver understands and will follow disposition?:  Reason for Disposition  [1] SEVERE pain (e.g., excruciating, unable to do any normal activities) AND [2] not improved after 2 hours of pain medicine  Answer Assessment - Initial Assessment Questions Pt was tx for gout 2 weeks ago and tx for gout. On 08/13/24, he was advised he had an infection in his right foot. States was tx with prednisone  and doxycycline , both completed. No improvement since. Pt continues to have severe 9/10 pain and non-pitting edema, even with percocet.   1. ONSET: When did the pain start?      2 weeks 2. LOCATION: Where is the pain located?      Right foot 3. PAIN: How bad is the pain?    (Scale 1-10; or mild, moderate, severe)     9/10 5. CAUSE: What do you think is causing the foot pain?     Infection 6. OTHER SYMPTOMS: Do you have any other symptoms? (e.g., leg pain, rash, fever, numbness)     Edema  Protocols used: Foot Pain-A-AH Message from [employer] sent at 08/20/2024  8:29 AMEST  Reason for Triage: Advised to go to urgent care 2 weeks ago today originally treated for gout in right foot and then found out he had infection in foot, pain is worse, swelling, bottom of foot is red and pain shoots from ankle to tip of toes, pain level is 9 sever pain

## 2024-08-20 NOTE — Progress Notes (Unsigned)
 "  Subjective:    Patient ID: Bryan JULIANNA Renna Mickey., male    DOB: 1955/08/07, 69 y.o.   MRN: [id number]  Bryan Baria. is a 69 y.o. male presenting on 08/20/2024 for Foot Injury (Right )  PCP Angeline Laura, FNP   HPI  Discussed the use of AI scribe software for clinical note transcription with the patient, who gave verbal consent to proceed.  History of Present Illness   Bryan Ibarra is a 69 year old male with a history of back surgery and nerve damage who presents with severe right foot pain and swelling.  Right foot pain and swelling - Severe pain and swelling in the right foot for two weeks - Pain is burning and sharp, shooting from the ankle to the toes, with numbness and tingling - Pain is exacerbated by touch, including the weight of a bed cover with hypersensitivity - Swelling persists despite attempts to elevate the foot, which increases pain - No relief from colchicine , prednisone  taper, steroid shot, or doxycycline  - No recent injury, fall, or trauma to the foot - No history of circulation issues - No significant redness or warmth; girlfriend noticed redness on the bottom of the foot  Prior treatments and diagnostic evaluation - Initially evaluated at walk-in clinic on January 7th - Initial suspicion of gout; treated with colchicine  and prednisone  taper - Lab results showed low uric acid levels; diagnosis changed to infection - Prescribed doxycycline  for presumed infection - No improvement with antibiotics or steroids  Neuropathic pain and medication management - History of back surgery and nerve damage; patient at pain clinic for management [facility] Dr Myra - Takes oxycodone  10-325 mg for back pain; does not relieve foot pain - Takes Lyrica  (pregabalin ) 300mg  for nerve pain; out of medication for two months due to prescription refill issue  Cardiovascular history - History of heart stent placement approximately five years ago - On blood thinners since  stent placement - No history of blood clots - Not taking diuretics           08/20/2024   11:20 AM 06/30/2024    2:22 PM 05/07/2024    8:28 AM  Depression screen PHQ 2/9  Decreased Interest 1 0 0  Down, Depressed, Hopeless 1 0 0  PHQ - 2 Score 2 0 0  Altered sleeping 2  0  Tired, decreased energy 1  0  Change in appetite 1  0  Feeling bad or failure about yourself  1  0  Trouble concentrating 1  0  Moving slowly or fidgety/restless 0  0  Suicidal thoughts 0  0  PHQ-9 Score 8  0   Difficult doing work/chores Not difficult at all  Not difficult at all     Data saved with a previous flowsheet row definition       08/20/2024   11:20 AM 05/07/2024    8:28 AM 11/06/2023    8:31 AM 05/15/2023    1:59 PM  GAD 7 : Generalized Anxiety Score  Nervous, Anxious, on Edge 1 0  0  0   Control/stop worrying 1 0  1  0   Worry too much - different things 1 0  1  0   Trouble relaxing 1 0  1  0   Restless 1 0  1  0   Easily annoyed or irritable 1 0  0  0   Afraid - awful might happen 1 0  0  0  Total GAD 7 Score 7 0 4 0  Anxiety Difficulty Not difficult at all Not difficult at all Somewhat difficult      Data saved with a previous flowsheet row definition    Social History[1]  Review of Systems Per HPI unless specifically indicated above     Objective:    BP 130/72 (BP Location: Right Arm, Patient Position: Sitting, Cuff Size: Normal)   Pulse 82   Ht 6' 2 (1.88 m)   Wt 211 lb (95.7 kg)   SpO2 98%   BMI 27.09 kg/m   Wt Readings from Last 3 Encounters:  08/20/24 211 lb (95.7 kg)  08/13/24 204 lb (92.5 kg)  08/06/24 203 lb 9.6 oz (92.4 kg)    Physical Exam Vitals and nursing note reviewed.  Constitutional:      General: He is not in acute distress.    Appearance: Normal appearance. He is well-developed. He is not diaphoretic.     Comments: Well-appearing, comfortable, cooperative  HENT:     Head: Normocephalic and atraumatic.  Eyes:     General:        Right eye: No  discharge.        Left eye: No discharge.     Conjunctiva/sclera: Conjunctivae normal.  Cardiovascular:     Rate and Rhythm: Normal rate.     Pulses: Normal pulses.  Pulmonary:     Effort: Pulmonary effort is normal.  Musculoskeletal:     Right lower leg: Edema (Isolated pitting edema to foot, mostly forefoot and into ankle region) present.     Comments: No calf tenderness  Skin:    General: Skin is warm and dry.     Findings: No erythema or rash.     Comments: No erythema or warmth.  Intact cap refill and pulses in R foot.  Neurological:     Mental Status: He is alert and oriented to person, place, and time.  Psychiatric:        Mood and Affect: Mood normal.        Behavior: Behavior normal.        Thought Content: Thought content normal.     Comments: Well groomed, good eye contact, normal speech and thoughts      R Foot showing  forefoot edema   I have personally reviewed the radiology report from 08/06/24 on X-rays R Foot Ankle.  DG Ankle Complete Right Order: [id number] Details  Reading Physician Reading Date Result Priority  Mannie Fallow, MD [phone]  08/06/2024    Narrative & Impression EXAM: 3 OR MORE VIEW(S) XRAY OF THE RIGHT ANKLE 08/06/2024 05:50:14 PM   CLINICAL HISTORY: pain, swelling, redness x1 week   COMPARISON: None available.   FINDINGS:   BONES AND JOINTS: Small plantar calcaneal spur. No acute fracture. No malalignment.   SOFT TISSUES: Unremarkable.   IMPRESSION: 1. No acute findings.   Electronically signed by: Fallow Mannie MD 08/06/2024 06:04 PM EST RP Workstation: [id number]   DG Foot Complete Right Order: [id number] Details  Reading Physician Reading Date Result Priority  Mannie Fallow, MD [phone]  08/06/2024    Narrative & Impression EXAM: 3 OR MORE VIEW(S) XRAY OF THE RIGHT FOOT 08/06/2024 05:50:14 PM   COMPARISON: None available.   CLINICAL HISTORY: pain, swelling, redness x1 week   FINDINGS:    BONES AND JOINTS: No acute fracture. No malalignment. Calcaneal spur. Mild first MTP joint osteoarthritis.   SOFT TISSUES: Mild forefoot soft tissue swelling.   IMPRESSION: 1. Mild forefoot soft  tissue swelling. 2. Mild first MTP joint osteoarthritis.   Electronically signed by: Elsie Gravely MD 08/06/2024 06:05 PM EST RP Workstation: [id number]    I have personally reviewed the radiology report   CLINICAL DATA:  Lumbar radiculopathy. Chronic low back and right hip pain. History of lumbar surgery.   EXAM: MRI LUMBAR SPINE WITHOUT CONTRAST   TECHNIQUE: Multiplanar, multisequence MR imaging of the lumbar spine was performed. No intravenous contrast was administered.   COMPARISON:  Lumbar spine CT 09/17/2023 and MRI 03/02/2023   FINDINGS: Segmentation:  Standard.   Alignment:  Minimal scoliosis.  No listhesis.   Vertebrae: No fracture, suspicious marrow lesion, or significant marrow edema. L4-5 posterior and interbody fusion, new from 03/02/2023.   Conus medullaris and cauda equina: Conus extends to the L1 level. Conus and cauda equina appear normal.   Paraspinal and other soft tissues: Postoperative changes in the posterior lumbar soft tissues. No significant fluid collection.   Disc levels:   Disc desiccation throughout the lumbar spine.   T12-L1: Small central disc extrusion with mild caudal migration, unchanged from the prior MRI. No stenosis.   L1-2: Left eccentric disc bulging and mild facet hypertrophy result in mild left lateral recess stenosis and mild left neural foraminal stenosis, mildly progressed from the prior MRI. No spinal stenosis.   L2-3: Disc bulging and mild right and moderate left facet and ligamentum flavum hypertrophy without significant stenosis, unchanged from the prior MRI.   L3-4: Disc bulging, prominent dorsal epidural fat, and mild facet and ligamentum flavum hypertrophy result in borderline spinal stenosis and mild bilateral  neural foraminal stenosis, unchanged from the prior MRI.   L4-5: Posterior decompression and fusion. Disc bulging, a broad central to left subarticular disc protrusion, endplate spurring, prominent or subdural fat, and facet hypertrophy result in mild residual spinal stenosis, mild-to-moderate left lateral recess stenosis, and mild right greater than left neural foraminal stenosis.   L5-S1: A right extraforaminal disc protrusion on the prior MRI has further regressed. Mild disc bulging and moderate facet hypertrophy do not result in significant stenosis.   IMPRESSION: 1. L4-5 fusion with mild residual spinal, left lateral recess, and neural foraminal stenosis. 2. Mild left lateral recess and left neural foraminal stenosis at L1-2, mildly progressed from 03/02/2023. 3. Unchanged borderline spinal stenosis and mild neural foraminal stenosis at L3-4. 4. Further regression of a right extraforaminal disc protrusion at L5-S1.     Electronically Signed   By: Dasie Hamburg M.D.   On: 10/02/2023 12:45  Results for orders placed or performed during the hospital encounter of 08/06/24  CBC with Differential   Collection Time: 08/06/24  6:00 PM  Result Value Ref Range   WBC 9.0 4.0 - 10.5 K/uL   RBC 4.37 4.22 - 5.81 MIL/uL   Hemoglobin 13.7 13.0 - 17.0 g/dL   HCT 60.8 60.9 - 47.9 %   MCV 89.5 80.0 - 100.0 fL   MCH 31.4 26.0 - 34.0 pg   MCHC 35.0 30.0 - 36.0 g/dL   RDW 86.8 88.4 - 84.4 %   Platelets 230 150 - 400 K/uL   nRBC 0.0 0.0 - 0.2 %   Neutrophils Relative % 65 %   Neutro Abs 5.8 1.7 - 7.7 K/uL   Lymphocytes Relative 26 %   Lymphs Abs 2.3 0.7 - 4.0 K/uL   Monocytes Relative 7 %   Monocytes Absolute 0.7 0.1 - 1.0 K/uL   Eosinophils Relative 1 %   Eosinophils Absolute 0.1 0.0 - 0.5 K/uL  Basophils Relative 0 %   Basophils Absolute 0.0 0.0 - 0.1 K/uL   Immature Granulocytes 1 %   Abs Immature Granulocytes 0.06 0.00 - 0.07 K/uL  Uric acid   Collection Time: 08/06/24  6:01  PM  Result Value Ref Range   Uric Acid, Serum <1.5 (L) 3.7 - 8.6 mg/dL      Assessment & Plan:   Problem List Items Addressed This Visit   None Visit Diagnoses       Acute foot pain, right    -  Primary     Neuropathic pain of right foot       Relevant Medications   pregabalin  (LYRICA ) 300 MG capsule     Status post lumbar spinal fusion         Degeneration of intervertebral disc of lumbar region with discogenic back pain and lower extremity pain       Relevant Medications   pregabalin  (LYRICA ) 300 MG capsule     Edema of right foot       Relevant Medications   furosemide  (LASIX ) 20 MG tablet     Bilateral lower extremity edema       Relevant Medications   furosemide  (LASIX ) 20 MG tablet       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 MRI shows progression of right extra foraminal disc protrusion at L5, suggesting nerve impingement.  Current medications ineffective. Suspected exacerbation due to lack of pregabalin  out for 2 months.  Unlikely acute gout, after initial eval at [facility] and treatment on prednisone , colchicine  limited results, and Uric acid very low. Unlikely cellulitis, which was other consideration ddx from UC, however no result on antibiotic course already and no erythema or systemic symptoms or source of infection identified.  History and exam and location not consistent with DVT. Well's Score -1 pt = unlikely  - Ordered pregabalin  (Lyrica ) 300 mg daily for 30 days, until can resume w/ pain management provider - Advised to follow up with [facility] Dr. Myra on Monday.  Edema of right foot Persistent edema in the right foot, likely secondary to neuropathic pain and reduced mobility. No signs of infection or gout. - Prescribed furosemide  (Lasix ) for short-term use (3-5 days) to reduce fluid  retention. - Advised elevation of the foot to reduce swelling.  Communication after visit. I successfully reached Dr Marcelino through [facility] Secure Chat on Epic today 08/21/24 to review the case as Dr Myra (patient's pain management provider) is not available in office today.  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 Suspected from L5-S1 spinal disease and or also a factor of the Lyrica  300mg  med running out for 2 months worsening his pain.  Dr Marcelino confirmed that he will share these updates with Dr Myra prior to the upcoming 1/26 apt for [facility].   No orders of the defined types were placed in this encounter.   Meds ordered this encounter  Medications   furosemide  (LASIX ) 20 MG tablet    Sig: Take 1 tablet (20 mg total) by mouth daily as needed for edema. Take for 3-5 days at a time then pause and may repeat if need.    Dispense:  30 tablet    Refill:  0   pregabalin  (LYRICA ) 300 MG capsule    Sig: Take 1 capsule (300 mg total) by mouth daily.    Dispense:  30 capsule    Refill:  0    Patient waiting to get back in with Dr Myra for refills. I will cover for 30 day acute refill.    Follow up plan: Return if symptoms worsen or fail to improve.   Marsa Officer, DO [facility] 08/20/2024, 10:48 AM     [1]  Social History Tobacco Use   Smoking status: Every Day    Current packs/day: 1.00    Average packs/day: 1 pack/day for 42.0 years (42.0 ttl pk-yrs)    Types: Cigarettes   Smokeless tobacco: Never   Tobacco comments:    About 8 cigarettes per day (04/10/23)  Vaping Use   Vaping status: Never Used  Substance Use Topics   Alcohol use: Not Currently   Drug use: Not Currently   "

## 2024-08-20 NOTE — Telephone Encounter (Signed)
 Can we check on Wegovy  PA? Thanks!

## 2024-08-20 NOTE — Patient Instructions (Signed)
 Thank you for coming to the office today.  I don't see any signs of gout or cellulitis infection.  It is unlikely to be due to blood clot or circulation problem either, the pulses are good I don't see any signs of this.  The pain is likely neuropathic due to the spine lumbar nerve impingement.  Refilled Pregabalin  Lyrica  300mg  daily for 30 days I think this is the problem with your pain, it is nerve pain and you are off this for past 2 months.  Also for swelling I ordered Furosemide  20mg , take it daily in AM for a few days 3-5 for now to help reduce swelling.  Use RICE therapy: - R - Rest / relative rest with activity modification avoid overuse of joint - I - Ice packs (make sure you use a towel or sock / something to protect skin) - C - Compression with ACE wrap to apply pressure and reduce swelling allowing more support - E - Elevation - if significant swelling, lift leg above heart level (toes above your nose) to help reduce swelling, most helpful at night after day of being on your feet  You have oxycodone  pain med future refill from Dr Myra at the pharmacy but it is not until 1st week of February. So you will need to get in with him to discuss further refills or other options if need.  Please schedule a Follow-up Appointment to: No follow-ups on file.  If you have any other questions or concerns, please feel free to call the office or send a message through MyChart. You may also schedule an earlier appointment if necessary.  Additionally, you may be receiving a survey about your experience at our office within a few days to 1 week by e-mail or mail. We value your feedback.  Marsa Officer, DO [facility], [place]

## 2024-08-21 ENCOUNTER — Other Ambulatory Visit: Payer: Self-pay | Admitting: Internal Medicine

## 2024-08-21 NOTE — Telephone Encounter (Signed)
 Requested Prescriptions  Pending Prescriptions Disp Refills   albuterol  (VENTOLIN  HFA) 108 (90 Base) MCG/ACT inhaler [Pharmacy Med Name: ALBUTEROL  HFA (VENTOLIN ) INH] 18 each 2    Sig: INHALE 1-2 PUFFS INTO THE LUNGS EVERY 6 HOURS AS NEEDED.     Pulmonology:  Beta Agonists 2 Passed - 08/21/2024 11:49 AM      Passed - Last BP in normal range    BP Readings from Last 1 Encounters:  08/20/24 130/72         Passed - Last Heart Rate in normal range    Pulse Readings from Last 1 Encounters:  08/20/24 82         Passed - Valid encounter within last 12 months    Recent Outpatient Visits           Yesterday Acute foot pain, right   [facility] [place] Marsa PARAS, DO   3 months ago Mixed hyperlipidemia   [place] [facility] [place], Angeline ORN, NP   9 months ago Encounter for general adult medical examination with abnormal findings    [facility] [place], Angeline ORN, NP

## 2024-08-22 ENCOUNTER — Telehealth: Payer: Self-pay

## 2024-08-22 ENCOUNTER — Telehealth: Payer: Self-pay | Admitting: Anesthesiology

## 2024-08-22 NOTE — Telephone Encounter (Signed)
 Attempted to call patient and inform him that his prescription could be filled on 09-04-24.  LM for him to call the office.

## 2024-08-22 NOTE — Telephone Encounter (Signed)
 Copied from CRM [id number]. Topic: Clinical - Medication Question >> Aug 22, 2024  3:11 PM Alexandria E wrote: Reason for CRM: Patient received a letter from [employer] stating that they are needing more information from patient's PCP regarding the Wegovy , letter did not state what specific information they are needing.
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
## 2024-08-25 ENCOUNTER — Telehealth: Payer: Self-pay | Admitting: Internal Medicine

## 2024-08-25 ENCOUNTER — Ambulatory Visit: Admitting: Anesthesiology

## 2024-08-25 NOTE — Telephone Encounter (Signed)
 Copied from CRM #[id number]. Topic: Clinical - Prescription Issue >> Aug 25, 2024  2:10 PM Geneva B wrote: Reason for CRM: patient is calling needs a prior authorization please call pt back to let him know when prior authorization has been sent [id number]

## 2024-08-26 ENCOUNTER — Ambulatory Visit
Admission: RE | Admit: 2024-08-26 | Discharge: 2024-08-26 | Disposition: A | Discharge status: Home / Self Care | Source: Ambulatory Visit | Attending: Anesthesiology | Admitting: Anesthesiology

## 2024-08-26 ENCOUNTER — Other Ambulatory Visit: Payer: Self-pay | Admitting: Anesthesiology

## 2024-08-26 ENCOUNTER — Encounter: Payer: Self-pay | Admitting: Anesthesiology

## 2024-08-26 ENCOUNTER — Ambulatory Visit (HOSPITAL_BASED_OUTPATIENT_CLINIC_OR_DEPARTMENT_OTHER): Admitting: Anesthesiology

## 2024-08-26 VITALS — BP 124/92 | HR 80 | Temp 98.0°F | Resp 20 | Ht 74.0 in | Wt 210.0 lb

## 2024-08-26 DIAGNOSIS — M792 Neuralgia and neuritis, unspecified: Secondary | ICD-10-CM

## 2024-08-26 DIAGNOSIS — M5431 Sciatica, right side: Secondary | ICD-10-CM | POA: Diagnosis present

## 2024-08-26 DIAGNOSIS — F1190 Opioid use, unspecified, uncomplicated: Secondary | ICD-10-CM

## 2024-08-26 DIAGNOSIS — M51362 Other intervertebral disc degeneration, lumbar region with discogenic back pain and lower extremity pain: Secondary | ICD-10-CM

## 2024-08-26 DIAGNOSIS — M961 Postlaminectomy syndrome, not elsewhere classified: Secondary | ICD-10-CM | POA: Insufficient documentation

## 2024-08-26 DIAGNOSIS — M47816 Spondylosis without myelopathy or radiculopathy, lumbar region: Secondary | ICD-10-CM | POA: Diagnosis present

## 2024-08-26 DIAGNOSIS — G894 Chronic pain syndrome: Secondary | ICD-10-CM | POA: Insufficient documentation

## 2024-08-26 DIAGNOSIS — R52 Pain, unspecified: Secondary | ICD-10-CM

## 2024-08-26 DIAGNOSIS — M545 Low back pain, unspecified: Secondary | ICD-10-CM

## 2024-08-26 MED ORDER — TRIAMCINOLONE ACETONIDE 40 MG/ML IJ SUSP
INTRAMUSCULAR | Status: AC
  Filled 2024-08-26: qty 1

## 2024-08-26 MED ORDER — ROPIVACAINE HCL 2 MG/ML IJ SOLN
10.0000 mL | Freq: Once | INTRAMUSCULAR | Status: AC
  Administered 2024-08-26: 10 mL via EPIDURAL

## 2024-08-26 MED ORDER — TRIAMCINOLONE ACETONIDE 40 MG/ML IJ SUSP
40.0000 mg | Freq: Once | INTRAMUSCULAR | Status: AC
  Administered 2024-08-26: 40 mg

## 2024-08-26 MED ORDER — LIDOCAINE HCL (PF) 1 % IJ SOLN
INTRAMUSCULAR | Status: AC
  Filled 2024-08-26: qty 5

## 2024-08-26 MED ORDER — SODIUM CHLORIDE 0.9% FLUSH
10.0000 mL | Freq: Once | INTRAVENOUS | Status: AC
  Administered 2024-08-26: 10 mL

## 2024-08-26 MED ORDER — ROPIVACAINE HCL 2 MG/ML IJ SOLN
INTRAMUSCULAR | Status: AC
  Filled 2024-08-26: qty 20

## 2024-08-26 MED ORDER — IOHEXOL 180 MG/ML  SOLN
INTRAMUSCULAR | Status: AC
  Filled 2024-08-26: qty 20

## 2024-08-26 MED ORDER — LIDOCAINE HCL (PF) 1 % IJ SOLN
5.0000 mL | Freq: Once | INTRAMUSCULAR | Status: AC
  Administered 2024-08-26: 5 mL via SUBCUTANEOUS

## 2024-08-26 MED ORDER — MIDAZOLAM HCL (PF) 2 MG/2ML IJ SOLN: 2.0000 mg | Freq: Once | INTRAMUSCULAR | Status: DC

## 2024-08-26 MED ORDER — IOHEXOL 180 MG/ML  SOLN
10.0000 mL | Freq: Once | INTRAMUSCULAR | Status: AC | PRN
  Administered 2024-08-26: 10 mL via EPIDURAL

## 2024-08-26 MED ORDER — OXYCODONE-ACETAMINOPHEN 10-325 MG PO TABS: 1.0000 | ORAL_TABLET | Freq: Three times a day (TID) | ORAL | 0 refills | Status: AC | PRN

## 2024-08-26 MED ORDER — LACTATED RINGERS IV SOLN: INTRAVENOUS | Status: DC

## 2024-08-26 MED ORDER — PREGABALIN 300 MG PO CAPS: 300.0000 mg | ORAL_CAPSULE | Freq: Every day | ORAL | 1 refills | Status: AC

## 2024-08-26 MED ORDER — SODIUM CHLORIDE (PF) 0.9 % IJ SOLN
INTRAMUSCULAR | Status: AC
  Filled 2024-08-26: qty 10

## 2024-08-26 MED ORDER — OXYCODONE-ACETAMINOPHEN 10-325 MG PO TABS: 1.0000 | ORAL_TABLET | Freq: Three times a day (TID) | ORAL | 0 refills | Status: DC | PRN

## 2024-08-26 NOTE — Patient Instructions (Signed)

## 2024-08-26 NOTE — Progress Notes (Signed)
 "  Subjective:  Patient ID: Bryan JULIANNA Renna Mickey., male    DOB: [birth year]  Age: 69 y.o. MRN: [id number]  CC: Hip Pain (right)   Procedure: L5-S1 epidural steroid under fluoroscopic guidance with no sedation  HPI Bryan Rahm. presents for return to clinic.  He has had an epidural in the past 3 weeks ago and got significant improvement rated at 50% lasting about a week before he started recurrence of some right lower extremity pain.  He has significant tenderness in the right lower leg and was recently evaluated for possible cellulitis placed on antibiotics for that but later told that they did not feel this was an infection related event.  They think he has sensitivity related to radiculitis and his primary care physicians have requested a repeat epidural.  He is also taking his baseline Percocet but has increased usage of this secondary to the severity of pain and been taking 5 of these a day.  He is due to be out in 1 day.  No side effects of the medication are reported and he states that it is the only thing that can give him relief to the point where he can sleep at night.  Otherwise he is in his usual state of health at this time.  No change in lower extremity strength function bowel or bladder function is noted.  Outpatient Medications Prior to Visit  Medication Sig Dispense Refill   amLODipine  (NORVASC ) 5 MG tablet Take 1 tablet (5 mg total) by mouth daily. 90 tablet 3   aspirin  EC 81 MG tablet Take 1 tablet (81 mg total) by mouth 3 (three) times a week. On Monday, Wednesday, Friday (Swallow whole).     BREZTRI  AEROSPHERE 160-9-4.8 MCG/ACT AERO INHALE 2 PUFFS INTO THE LUNGS TWICE A DAY 32.1 each 2   carvedilol  (COREG ) 6.25 MG tablet Take 1 tablet (6.25 mg total) by mouth 2 (two) times daily with a meal. 180 tablet 3   cyclobenzaprine  (FLEXERIL ) 10 MG tablet TAKE 1 TABLET BY MOUTH EVERYDAY AT BEDTIME 30 tablet 5   lisinopril  (ZESTRIL ) 40 MG tablet Take 1 tablet (40 mg total) by mouth  daily. 90 tablet 3   Melatonin 10 MG TABS Take 10 mg by mouth at bedtime.     montelukast  (SINGULAIR ) 10 MG tablet TAKE 1 TABLET BY MOUTH EVERYDAY AT BEDTIME 90 tablet 0   omeprazole  (PRILOSEC) 40 MG capsule Take 1 capsule (40 mg total) by mouth daily. 90 capsule 1   rosuvastatin  (CRESTOR ) 10 MG tablet TAKE 1 TABLET BY MOUTH EVERY DAY 90 tablet 3   semaglutide -weight management (WEGOVY ) 1 MG/0.5ML SOAJ SQ injection INJECT 1MG  INTO THE SKIN ONCE A WEEK 6 mL 1   [START ON 09/04/2024] oxyCODONE -acetaminophen  (PERCOCET) 10-325 MG tablet Take 1 tablet by mouth every 6 (six) hours as needed for pain. 120 tablet 0   albuterol  (VENTOLIN  HFA) 108 (90 Base) MCG/ACT inhaler INHALE 1-2 PUFFS INTO THE LUNGS EVERY 6 HOURS AS NEEDED. 18 each 2   colchicine  0.6 MG tablet Take 1 tablet (0.6 mg total) by mouth every 3 (three) days. (Patient not taking: Reported on 08/13/2024) 3 tablet 0   furosemide  (LASIX ) 20 MG tablet Take 1 tablet (20 mg total) by mouth daily as needed for edema. Take for 3-5 days at a time then pause and may repeat if need. 30 tablet 0   vardenafil  (LEVITRA ) 10 MG tablet Take 1 tablet (10 mg total) by mouth daily as needed for erectile dysfunction. (  Patient not taking: Reported on 08/20/2024) 10 tablet 5   pregabalin  (LYRICA ) 300 MG capsule Take 1 capsule (300 mg total) by mouth daily. 30 capsule 0   No facility-administered medications prior to visit.    Review of Systems CNS: No confusion or sedation Cardiac: No angina or palpitations GI: No abdominal pain or constipation Constitutional: No nausea vomiting fevers or chills  Objective:  BP (!) 124/92   Pulse 80   Temp 98 F (36.7 C)   Resp 20   Ht 6' 2 (1.88 m)   Wt 210 lb (95.3 kg)   SpO2 98%   BMI 26.96 kg/m    BP Readings from Last 3 Encounters:  08/26/24 (!) 124/92  08/20/24 130/72  08/13/24 107/74     Wt Readings from Last 3 Encounters:  08/26/24 210 lb (95.3 kg)  08/20/24 211 lb (95.7 kg)  08/13/24 204 lb (92.5 kg)      Physical Exam Pt is alert and oriented PERRL EOMI HEART IS RRR no murmur or rub LCTA no wheezing or rales MUSCULOSKELETAL reveals lower extremity edema bilateral and symmetric to the pretibial level.  He does have sensitivity in the right lateral foot but no signs consistent with allodynia for RSD.  He has a positive straight leg raise on the right side.  He has some bilateral muscular tenderness in the lumbar region.  Labs  Lab Results  Component Value Date   HGBA1C 5.3 05/07/2024   HGBA1C 5.8 (H) 11/06/2023   HGBA1C 5.5 11/28/2022   Lab Results  Component Value Date   LDLCALC 33 05/07/2024   CREATININE 0.85 05/07/2024    -------------------------------------------------------------------------------------------------------------------- Lab Results  Component Value Date   WBC 9.0 08/06/2024   HGB 13.7 08/06/2024   HCT 39.1 08/06/2024   PLT 230 08/06/2024   GLUCOSE 91 05/07/2024   CHOL 93 05/07/2024   TRIG 68 05/07/2024   HDL 46 05/07/2024   LDLDIRECT <4 06/24/2018   LDLCALC 33 05/07/2024   ALT 13 05/07/2024   AST 12 05/07/2024   NA 140 05/07/2024   K 4.3 05/07/2024   CL 107 05/07/2024   CREATININE 0.85 05/07/2024   BUN 11 05/07/2024   CO2 27 05/07/2024   TSH 0.89 01/30/2020   PSA 1.83 11/06/2023   INR 0.9 08/02/2019   HGBA1C 5.3 05/07/2024    --------------------------------------------------------------------------------------------------------------------- DG PAIN CLINIC C-ARM 1-60 MIN NO REPORT Result Date: 08/26/2024 Fluoro was used, but no Radiologist interpretation will be provided. Please refer to NOTES tab for provider progress note.    Assessment & Plan:   Bryan Ibarra was seen today for hip pain.  Diagnoses and all orders for this visit:  Chronic pain syndrome  Chronic, continuous use of opioids  Post laminectomy syndrome  Low back pain at multiple sites  Facet arthritis of lumbar region  Sciatica of right  side  Neuropathic pain of right foot -     pregabalin  (LYRICA ) 300 MG capsule; Take 1 capsule (300 mg total) by mouth daily.  Degeneration of intervertebral disc of lumbar region with discogenic back pain and lower extremity pain -     pregabalin  (LYRICA ) 300 MG capsule; Take 1 capsule (300 mg total) by mouth daily.  Other orders -     triamcinolone  acetonide (KENALOG -40) injection 40 mg -     ropivacaine  (PF) 2 mg/mL (0.2%) (NAROPIN ) injection 10 mL -     lidocaine  (PF) (XYLOCAINE ) 1 % injection 5 mL -     lactated ringers  infusion -  midazolam  PF (VERSED ) injection 2 mg -     sodium chloride  flush (NS) 0.9 % injection 10 mL -     iohexol  (OMNIPAQUE ) 180 MG/ML injection 10 mL -     Discontinue: oxyCODONE -acetaminophen  (PERCOCET) 10-325 MG tablet; Take 1 tablet by mouth every 8 (eight) hours as needed for pain. -     oxyCODONE -acetaminophen  (PERCOCET) 10-325 MG tablet; Take 1 tablet by mouth every 8 (eight) hours as needed for pain.        ----------------------------------------------------------------------------------------------------------------------  Problem List Items Addressed This Visit       Unprioritized   Chronic pain syndrome - Primary   Relevant Medications   pregabalin  (LYRICA ) 300 MG capsule   oxyCODONE -acetaminophen  (PERCOCET) 10-325 MG tablet (Start on 08/27/2024)   Other Visit Diagnoses       Chronic, continuous use of opioids         Post laminectomy syndrome         Low back pain at multiple sites       Relevant Medications   triamcinolone  acetonide (KENALOG -40) injection 40 mg (Completed)   oxyCODONE -acetaminophen  (PERCOCET) 10-325 MG tablet (Start on 08/27/2024)     Facet arthritis of lumbar region       Relevant Medications   triamcinolone  acetonide (KENALOG -40) injection 40 mg (Completed)   oxyCODONE -acetaminophen  (PERCOCET) 10-325 MG tablet (Start on 08/27/2024)     Sciatica of right side       Relevant Medications   midazolam  PF (VERSED )  injection 2 mg   pregabalin  (LYRICA ) 300 MG capsule     Neuropathic pain of right foot       Relevant Medications   pregabalin  (LYRICA ) 300 MG capsule     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 Chronic pain syndrome (Primary) I have reviewed the [place]  practitioner database information and it is appropriate for a one-time early refill secondary to his current circumstances and his inability to gain relief.  I have cautioned him against request for early refill going forward and the need to be compliant with the clinic prescribed regimen for 4 times a day dosing on his Percocet.  He understands.  2. Chronic, continuous use of opioids As above  3. Post laminectomy syndrome Will proceed with a repeat L5 5 S1 lumbar epidural steroid as per request by patient today.  We have gone over the risks and benefits of the procedure in full detail all questions were answered.  4. Low back pain at multiple sites Continue with slow gradual return to physical therapy exercises as tolerated  5. Facet arthritis of lumbar region As above  6. Sciatica of right side Epidural today.  Ultimately he may be a candidate for lidocaine  infusion or diagnostic lumbar sympathetic block  7. Neuropathic pain of right foot As above with continuation of the Lyrica .  He is presently taking 600 mg at bedtime and tolerating this well.  He states that it is the only way he can get relief and sleep at night. - pregabalin  (LYRICA ) 300 MG capsule; Take 1 capsule (300 mg total) by mouth daily.  Dispense: 30 capsule;  Refill: 1  8. Degeneration of intervertebral disc of lumbar region with discogenic back  pain and lower extremity pain As above - pregabalin  (LYRICA ) 300 MG capsule; Take 1 capsule (300 mg total) by mouth daily.  Dispense: 30 capsule; Refill: 1    ----------------------------------------------------------------------------------------------------------------------  I am having Bryan Ibarra maintain his Melatonin, vardenafil , Breztri  Aerosphere, aspirin  EC, carvedilol , lisinopril , rosuvastatin , amLODipine , omeprazole , montelukast , Wegovy , cyclobenzaprine , colchicine , furosemide , albuterol , pregabalin , and oxyCODONE -acetaminophen . We administered triamcinolone  acetonide, ropivacaine  (PF) 2 mg/mL (0.2%), lidocaine  (PF), sodium chloride  flush, and iohexol .   Meds ordered this encounter  Medications   triamcinolone  acetonide (KENALOG -40) injection 40 mg   ropivacaine  (PF) 2 mg/mL (0.2%) (NAROPIN ) injection 10 mL   lidocaine  (PF) (XYLOCAINE ) 1 % injection 5 mL   lactated ringers  infusion   midazolam  PF (VERSED ) injection 2 mg   sodium chloride  flush (NS) 0.9 % injection 10 mL   iohexol  (OMNIPAQUE ) 180 MG/ML injection 10 mL   DISCONTD: oxyCODONE -acetaminophen  (PERCOCET) 10-325 MG tablet    Sig: Take 1 tablet by mouth every 8 (eight) hours as needed for pain.    Dispense:  120 tablet    Refill:  0   pregabalin  (LYRICA ) 300 MG capsule    Sig: Take 1 capsule (300 mg total) by mouth daily.    Dispense:  30 capsule    Refill:  1    Patient waiting to get back in with Dr Myra for refills. I will cover for 30 day acute refill.   oxyCODONE -acetaminophen  (PERCOCET) 10-325 MG tablet    Sig: Take 1 tablet by mouth every 8 (eight) hours as needed for pain.    Dispense:  120 tablet    Refill:  0   Patient's Medications  New Prescriptions   OXYCODONE -ACETAMINOPHEN  (PERCOCET) 10-325 MG TABLET    Take 1 tablet by mouth every 8 (eight) hours as needed for pain.  Previous Medications    ALBUTEROL  (VENTOLIN  HFA) 108 (90 BASE) MCG/ACT INHALER    INHALE 1-2 PUFFS INTO THE LUNGS EVERY 6 HOURS AS NEEDED.   AMLODIPINE  (NORVASC ) 5 MG TABLET    Take 1 tablet (5 mg total) by mouth daily.   ASPIRIN  EC 81 MG TABLET    Take 1 tablet (81 mg total) by mouth 3 (three) times a week. On Monday, Wednesday, Friday (Swallow whole).   BREZTRI  AEROSPHERE 160-9-4.8 MCG/ACT AERO    INHALE 2 PUFFS INTO THE LUNGS TWICE A DAY   CARVEDILOL  (COREG ) 6.25 MG TABLET    Take 1 tablet (6.25 mg total) by mouth 2 (two) times daily with a meal.   COLCHICINE  0.6 MG TABLET    Take 1 tablet (0.6 mg total) by mouth every 3 (three) days.   CYCLOBENZAPRINE  (FLEXERIL ) 10 MG TABLET    TAKE 1 TABLET BY MOUTH EVERYDAY AT BEDTIME   FUROSEMIDE  (LASIX ) 20 MG TABLET    Take 1 tablet (20 mg total) by mouth daily as needed for edema. Take for 3-5 days at a time then pause and may repeat if need.   LISINOPRIL  (ZESTRIL ) 40 MG TABLET    Take 1 tablet (40 mg total) by mouth daily.   MELATONIN 10 MG TABS    Take 10 mg by mouth at bedtime.   MONTELUKAST  (SINGULAIR ) 10 MG TABLET    TAKE 1 TABLET BY MOUTH EVERYDAY AT BEDTIME   OMEPRAZOLE  (PRILOSEC) 40 MG CAPSULE    Take 1 capsule (40 mg total) by mouth daily.   ROSUVASTATIN  (CRESTOR ) 10 MG TABLET    TAKE 1 TABLET BY MOUTH EVERY DAY   SEMAGLUTIDE -WEIGHT MANAGEMENT (WEGOVY ) 1  MG/0.5ML SOAJ SQ INJECTION    INJECT 1MG  INTO THE SKIN ONCE A WEEK   VARDENAFIL  (LEVITRA ) 10 MG TABLET    Take 1 tablet (10 mg total) by mouth daily as needed for erectile dysfunction.  Modified Medications   Modified Medication Previous Medication   PREGABALIN  (LYRICA ) 300 MG CAPSULE pregabalin  (LYRICA ) 300 MG capsule      Take 1 capsule (300 mg total) by mouth daily.    Take 1 capsule (300 mg total) by mouth daily.  Discontinued Medications   OXYCODONE -ACETAMINOPHEN  (PERCOCET) 10-325 MG TABLET    Take 1 tablet by mouth every 6 (six) hours as needed for pain.    ----------------------------------------------------------------------------------------------------------------------  Follow-up: Return in about 1 month (around 09/26/2024) for (VV) evaluation, med refill.   Procedure: L5-S1 LESI with fluoroscopic guidance and without moderate sedation  NOTE: The risks, benefits, and expectations of the procedure have been discussed and explained to the patient who was understanding and in agreement with suggested treatment plan. No guarantees were made.  DESCRIPTION OF PROCEDURE: Lumbar epidural steroid injection with no IV Versed , EKG, blood pressure, pulse, and pulse oximetry monitoring. The procedure was performed with the patient in the prone position under fluoroscopic guidance.  Sterile prep x3 was initiated and I then injected subcutaneous lidocaine  to the overlying L5-S1 site after its fluoroscopic identifictation.  Using strict aseptic technique, I then advanced an 18-gauge Tuohy epidural needle in the midline using interlaminar approach via loss-of-resistance to saline technique. There was negative aspiration for heme or  CSF.  I then confirmed position with both AP and Lateral fluoroscan.  2 cc of contrast dye were injected and a  total of 5 mL of Preservative-Free normal saline mixed with 40 mg of Kenalog  and 1cc Ropicaine 0.2 percent were injected incrementally via the  epidurally placed needle. The needle was removed. The patient tolerated the injection well and was convalesced and discharged to home in stable condition. Should the patient have any post procedure difficulty they have been instructed on how to contact us  for assistance.   Lynwood KANDICE Clause, MD  "

## 2024-08-26 NOTE — Progress Notes (Signed)
 Nursing Pain Medication Assessment:  Safety precautions to be maintained throughout the outpatient stay will include: orient to surroundings, keep bed in low position, maintain call bell within reach at all times, provide assistance with transfer out of bed and ambulation.  Medication Inspection Compliance: Pill count conducted under aseptic conditions, in front of the patient. Neither the pills nor the bottle was removed from the patient's sight at any time. Once count was completed pills were immediately returned to the patient in their original bottle.  Medication: Oxycodone /APAP Pill/Patch Count: 5 of 120 pills/patches remain Pill/Patch Appearance: Markings consistent with prescribed medication Bottle Appearance: Standard pharmacy container. Clearly labeled. Filled Date: 01 / 06 / 2025 Last Medication intake:  Today

## 2024-08-27 ENCOUNTER — Telehealth: Payer: Self-pay | Admitting: Anesthesiology

## 2024-08-27 ENCOUNTER — Telehealth: Payer: Self-pay | Admitting: *Deleted

## 2024-08-27 NOTE — Telephone Encounter (Signed)
 Dr Myra is unavailable until Monday or Tues.

## 2024-08-27 NOTE — Telephone Encounter (Signed)
 Pharmacy says our office will need to call them about the script Dr Myra sent in yesterday. He cannot pick up. Please call pharmacy

## 2024-08-27 NOTE — Telephone Encounter (Signed)
 Spoke with pharmacy about oxycodone  - apap 10/325 mg.  I have told patient that he is unable to fill Rx until 09/03/24 at the earliest.  [facility] also need to get the Rx corrected on the qty.  We will bring that to Dr Myra when he is in next week.

## 2024-08-27 NOTE — Telephone Encounter (Signed)
 Post procedure call; patient reports he is a little sore but no other concerns.

## 2024-08-28 ENCOUNTER — Other Ambulatory Visit (HOSPITAL_COMMUNITY): Payer: Self-pay

## 2024-08-28 ENCOUNTER — Telehealth: Payer: Self-pay | Admitting: Internal Medicine

## 2024-08-28 ENCOUNTER — Telehealth: Payer: Self-pay

## 2024-08-28 NOTE — Telephone Encounter (Signed)
 Pharmacy Patient Advocate Encounter   Received notification from Pt Calls Messages that prior authorization for Wegovy  1mg /0.15ml is required/requested.   Insurance verification completed.   The patient is insured through [facility].   Per test claim: PA required; PA submitted to above mentioned insurance via Latent Key/confirmation #/EOC [id number] Status is pending

## 2024-08-28 NOTE — Telephone Encounter (Signed)
 Medication: semaglutide -weight management (WEGOVY ) 1 MG/0.5ML SOAJ SQ injection   Needs prior authorization for this, he called Monday requesting this.   Please process PA

## 2024-08-28 NOTE — Telephone Encounter (Signed)
 Copied from CRM #[id number]. Topic: Clinical - Medication Refill >> Aug 28, 2024  3:12 PM Rosaria E wrote: Medication: semaglutide -weight management (WEGOVY ) 1 MG/0.5ML SOAJ SQ injection  Needs prior authorization for this, he called Monday requesting this.   Has the patient contacted their pharmacy? Yes (Agent: If no, request that the patient contact the pharmacy for the refill. If patient does not wish to contact the pharmacy document the reason why and proceed with request.) (Agent: If yes, when and what did the pharmacy advise?)  This is the patient's preferred pharmacy:  [id number] - [place], [place] - [address] [place] [address] Phone: [phone] Fax: [phone]  Is this the correct pharmacy for this prescription? Yes If no, delete pharmacy and type the correct one.   Has the prescription been filled recently? Yes  Is the patient out of the medication? Yes  Has the patient been seen for an appointment in the last year OR does the patient have an upcoming appointment? Yes  Can we respond through MyChart? Yes  Agent: Please be advised that Rx refills may take up to 3 business days. We ask that you follow-up with your pharmacy.

## 2024-08-29 ENCOUNTER — Other Ambulatory Visit (HOSPITAL_COMMUNITY): Payer: Self-pay

## 2024-09-02 ENCOUNTER — Ambulatory Visit: Admitting: Internal Medicine

## 2024-09-02 ENCOUNTER — Encounter: Payer: Self-pay | Admitting: Internal Medicine

## 2024-09-02 VITALS — BP 124/82 | Ht 74.0 in | Wt 210.0 lb

## 2024-09-02 DIAGNOSIS — F17200 Nicotine dependence, unspecified, uncomplicated: Secondary | ICD-10-CM | POA: Diagnosis not present

## 2024-09-02 DIAGNOSIS — M5441 Lumbago with sciatica, right side: Secondary | ICD-10-CM

## 2024-09-02 DIAGNOSIS — R600 Localized edema: Secondary | ICD-10-CM

## 2024-09-02 DIAGNOSIS — G8929 Other chronic pain: Secondary | ICD-10-CM

## 2024-09-02 NOTE — Patient Instructions (Signed)
 Edema  Edema is when you have too much fluid in your body or under your skin. Edema may make your legs, feet, and ankles swell. Swelling often happens in looser tissues, such as around your eyes. This is a common condition. It gets more common as you get older. There are many possible causes of edema. These include: Eating too much salt (sodium). Being on your feet or sitting for a long time. Certain medical conditions, such as: Pregnancy. Heart failure. Liver disease. Kidney disease. Cancer. Hot weather may make edema worse. Edema is usually painless. Your skin may look swollen or shiny. Follow these instructions at home: Medicines Take over-the-counter and prescription medicines only as told by your doctor. Your doctor may prescribe a medicine to help your body get rid of extra water (diuretic). Take this medicine if you are told to take it. Eating and drinking Eat a low-salt (low-sodium) diet as told by your doctor. Sometimes, eating less salt may reduce swelling. Depending on the cause of your swelling, you may need to limit how much fluid you drink (fluid restriction). General instructions Raise the injured area above the level of your heart while you are sitting or lying down. Do not sit still or stand for a long time. Do not wear tight clothes. Do not wear garters on your upper legs. Exercise your legs. This can help the swelling go down. Wear compression stockings as told by your doctor. It is important that these are the right size. These should be prescribed by your doctor to prevent possible injuries. If elastic bandages or wraps are recommended, use them as told by your doctor. Contact a doctor if: Treatment is not working. You have heart, liver, or kidney disease and have symptoms of edema. You have sudden and unexplained weight gain. Get help right away if: You have shortness of breath or chest pain. You cannot breathe when you lie down. You have pain, redness, or  warmth in the swollen areas. You have heart, liver, or kidney disease and get edema all of a sudden. You have a fever and your symptoms get worse all of a sudden. These symptoms may be an emergency. Get help right away. Call 911. Do not wait to see if the symptoms will go away. Do not drive yourself to the hospital. Summary Edema is when you have too much fluid in your body or under your skin. Edema may make your legs, feet, and ankles swell. Swelling often happens in looser tissues, such as around your eyes. Raise the injured area above the level of your heart while you are sitting or lying down. Follow your doctor's instructions about diet and how much fluid you can drink. This information is not intended to replace advice given to you by your health care provider. Make sure you discuss any questions you have with your health care provider. Document Revised: 02/22/2024 Document Reviewed: 03/21/2021 Elsevier Patient Education  2025 [employer].

## 2024-09-03 ENCOUNTER — Other Ambulatory Visit (HOSPITAL_COMMUNITY): Payer: Self-pay

## 2024-09-03 ENCOUNTER — Encounter (INDEPENDENT_AMBULATORY_CARE_PROVIDER_SITE_OTHER)

## 2024-09-03 NOTE — Telephone Encounter (Signed)
 Pharmacy Patient Advocate Encounter  Received notification from [facility] MEDICARE that Prior Authorization for Wegovy  0.5mg /0.53ml has been DENIED.  This coverage request has been reviewed and denied by [employer] PA department on 08/28/24. Faxed to Appeals at [phone]   PA #/Case ID/Reference #: [id number]  The request I submitted was a duplicate request and the 1st request was submitted by the patient and was denied. Full denial letter has been indexed to media.

## 2024-09-03 NOTE — Telephone Encounter (Signed)
 Please fax updated note from 09/02/2024 for clarifications on the questions below

## 2024-09-04 ENCOUNTER — Other Ambulatory Visit: Payer: Self-pay | Admitting: Internal Medicine

## 2024-09-04 MED ORDER — WEGOVY 1 MG/0.5ML ~~LOC~~ SOAJ: 1.0000 mg | SUBCUTANEOUS | 1 refills | Status: AC

## 2024-09-04 NOTE — Telephone Encounter (Signed)
 Copied from CRM [id number]. Topic: Clinical - Prescription Issue >> Sep 04, 2024 10:26 AM Terri MATSU wrote: Reason for CRM: Patient is checking on status on his WEGOVY . [facility] and they stated they don't have his prescription and there was no refills left.

## 2024-09-04 NOTE — Telephone Encounter (Signed)
 Requested Prescriptions  Pending Prescriptions Disp Refills   omeprazole  (PRILOSEC) 40 MG capsule [Pharmacy Med Name: OMEPRAZOLE  DR 40 MG CAPSULE] 90 capsule 1    Sig: TAKE 1 CAPSULE (40 MG TOTAL) BY MOUTH DAILY.     Gastroenterology: Proton Pump Inhibitors Passed - 09/04/2024 11:08 AM      Passed - Valid encounter within last 12 months    Recent Outpatient Visits           2 days ago Edema of right lower extremity   [facility] [place], Angeline ORN, NP   2 weeks ago Acute foot pain, right   [facility] [place], Marsa PARAS, DO   4 months ago Mixed hyperlipidemia   [place] [facility] [place], Angeline ORN, NP   10 months ago Encounter for general adult medical examination with abnormal findings   [place] [facility] [place], Angeline ORN, NP       Future Appointments             In 5 days Gerard Frederick, NP [facility]             semaglutide -weight management (WEGOVY ) 1 MG/0.5ML SOAJ SQ injection 6 mL 1    Sig: Inject 1 mg into the skin once a week.     Endocrinology:  Diabetes - GLP-1 Receptor Agonists - semaglutide  Passed - 09/04/2024 11:08 AM      Passed - HBA1C in normal range and within 180 days    Hgb A1c MFr Bld  Date Value Ref Range Status  05/07/2024 5.3 <5.7 % Final    Comment:    For the purpose of screening for the presence of diabetes: . <5.7%       Consistent with the absence of diabetes 5.7-6.4%    Consistent with increased risk for diabetes             (prediabetes) > or =6.5%  Consistent with diabetes . This assay result is consistent with a decreased risk of diabetes. . Currently, no consensus exists regarding use of hemoglobin A1c for diagnosis of diabetes in children. . According to American Diabetes Association (ADA) guidelines, hemoglobin A1c <7.0% represents optimal control in non-pregnant diabetic patients. Different metrics may  apply to specific patient populations.  Standards of Medical Care in Diabetes(ADA). .          Passed - Cr in normal range and within 360 days    Creat  Date Value Ref Range Status  05/07/2024 0.85 0.70 - 1.35 mg/dL Final         Passed - Valid encounter within last 6 months    Recent Outpatient Visits           2 days ago Edema of right lower extremity   [place] [facility] [place], Angeline ORN, NP   2 weeks ago Acute foot pain, right   [place] [place], Marsa PARAS, DO   4 months ago Mixed hyperlipidemia   [facility] [place], Angeline ORN, NP   10 months ago Encounter for general adult medical examination with abnormal findings   [facility] [place], Angeline ORN, NP       Future Appointments             In 5 days Gerard Frederick, NP [facility] [place]

## 2024-09-05 ENCOUNTER — Other Ambulatory Visit (INDEPENDENT_AMBULATORY_CARE_PROVIDER_SITE_OTHER)

## 2024-09-05 ENCOUNTER — Encounter (INDEPENDENT_AMBULATORY_CARE_PROVIDER_SITE_OTHER)

## 2024-09-05 ENCOUNTER — Encounter (INDEPENDENT_AMBULATORY_CARE_PROVIDER_SITE_OTHER): Payer: Self-pay

## 2024-09-05 DIAGNOSIS — R600 Localized edema: Secondary | ICD-10-CM

## 2024-09-09 ENCOUNTER — Ambulatory Visit: Admitting: Cardiology

## 2024-09-18 ENCOUNTER — Ambulatory Visit: Admitting: Internal Medicine

## 2024-11-10 ENCOUNTER — Encounter: Admitting: Internal Medicine
